# Patient Record
Sex: Female | Born: 1940
Health system: Southern US, Community
[De-identification: ages and names within clinical notes are randomized; demographics above are authoritative.]

## PROBLEM LIST (undated history)

## (undated) DIAGNOSIS — C50412 Malignant neoplasm of upper-outer quadrant of left female breast: Secondary | ICD-10-CM

## (undated) DIAGNOSIS — E7800 Pure hypercholesterolemia, unspecified: Secondary | ICD-10-CM

## (undated) DIAGNOSIS — J189 Pneumonia, unspecified organism: Secondary | ICD-10-CM

## (undated) DIAGNOSIS — E119 Type 2 diabetes mellitus without complications: Secondary | ICD-10-CM

## (undated) DIAGNOSIS — F0390 Unspecified dementia without behavioral disturbance: Secondary | ICD-10-CM

## (undated) DIAGNOSIS — F32A Depression, unspecified: Secondary | ICD-10-CM

## (undated) DIAGNOSIS — R0600 Dyspnea, unspecified: Secondary | ICD-10-CM

## (undated) DIAGNOSIS — M1990 Unspecified osteoarthritis, unspecified site: Secondary | ICD-10-CM

## (undated) DIAGNOSIS — K08109 Complete loss of teeth, unspecified cause, unspecified class: Secondary | ICD-10-CM

## (undated) DIAGNOSIS — D649 Anemia, unspecified: Secondary | ICD-10-CM

## (undated) DIAGNOSIS — IMO0002 Reserved for concepts with insufficient information to code with codable children: Secondary | ICD-10-CM

## (undated) DIAGNOSIS — IMO0001 Reserved for inherently not codable concepts without codable children: Secondary | ICD-10-CM

## (undated) DIAGNOSIS — Z9981 Dependence on supplemental oxygen: Secondary | ICD-10-CM

## (undated) DIAGNOSIS — F419 Anxiety disorder, unspecified: Secondary | ICD-10-CM

## (undated) DIAGNOSIS — R413 Other amnesia: Secondary | ICD-10-CM

## (undated) DIAGNOSIS — Z972 Presence of dental prosthetic device (complete) (partial): Secondary | ICD-10-CM

## (undated) DIAGNOSIS — S62109A Fracture of unspecified carpal bone, unspecified wrist, initial encounter for closed fracture: Secondary | ICD-10-CM

## (undated) DIAGNOSIS — J449 Chronic obstructive pulmonary disease, unspecified: Secondary | ICD-10-CM

## (undated) DIAGNOSIS — I10 Essential (primary) hypertension: Secondary | ICD-10-CM

## (undated) DIAGNOSIS — F329 Major depressive disorder, single episode, unspecified: Secondary | ICD-10-CM

## (undated) HISTORY — DX: Type 2 diabetes mellitus without complications: E11.9

## (undated) HISTORY — PX: APPENDECTOMY: SHX54

## (undated) HISTORY — DX: Pure hypercholesterolemia, unspecified: E78.00

## (undated) HISTORY — DX: Chronic obstructive pulmonary disease, unspecified: J44.9

## (undated) HISTORY — DX: Depression, unspecified: F32.A

## (undated) HISTORY — DX: Malignant neoplasm of upper-outer quadrant of left female breast: C50.412

## (undated) HISTORY — PX: TONSILLECTOMY: SUR1361

## (undated) HISTORY — PX: CHOLECYSTECTOMY: SHX55

## (undated) HISTORY — PX: ABDOMINAL HYSTERECTOMY: SHX81

## (undated) HISTORY — DX: Essential (primary) hypertension: I10

## (undated) HISTORY — PX: ABDOMINAL HYSTERECTOMY: SUR658

## (undated) HISTORY — PX: EYE SURGERY: SHX253

## (undated) HISTORY — DX: Major depressive disorder, single episode, unspecified: F32.9

---
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
## 1998-11-18 ENCOUNTER — Ambulatory Visit: Admission: RE | Admit: 1998-11-18 | Discharge: 1998-11-18 | Payer: Self-pay | Admitting: *Deleted

## 1998-11-18 ENCOUNTER — Encounter: Payer: Self-pay | Admitting: *Deleted

## 1999-01-15 ENCOUNTER — Other Ambulatory Visit: Admission: RE | Admit: 1999-01-15 | Discharge: 1999-01-15 | Payer: Self-pay | Admitting: Obstetrics and Gynecology

## 1999-05-04 ENCOUNTER — Encounter (INDEPENDENT_AMBULATORY_CARE_PROVIDER_SITE_OTHER): Payer: Self-pay | Admitting: Specialist

## 1999-05-04 ENCOUNTER — Ambulatory Visit (HOSPITAL_COMMUNITY): Admission: RE | Admit: 1999-05-04 | Discharge: 1999-05-04 | Payer: Self-pay | Admitting: Gastroenterology

## 1999-06-19 ENCOUNTER — Encounter: Admission: RE | Admit: 1999-06-19 | Discharge: 1999-08-25 | Payer: Self-pay

## 2000-02-25 ENCOUNTER — Other Ambulatory Visit: Admission: RE | Admit: 2000-02-25 | Discharge: 2000-02-25 | Payer: Self-pay | Admitting: Obstetrics and Gynecology

## 2000-03-07 ENCOUNTER — Other Ambulatory Visit: Admission: RE | Admit: 2000-03-07 | Discharge: 2000-03-07 | Payer: Self-pay | Admitting: *Deleted

## 2000-03-07 ENCOUNTER — Encounter: Admission: RE | Admit: 2000-03-07 | Discharge: 2000-03-07 | Payer: Self-pay | Admitting: *Deleted

## 2000-03-07 ENCOUNTER — Encounter: Payer: Self-pay | Admitting: *Deleted

## 2000-03-07 ENCOUNTER — Encounter (INDEPENDENT_AMBULATORY_CARE_PROVIDER_SITE_OTHER): Payer: Self-pay | Admitting: *Deleted

## 2001-04-26 ENCOUNTER — Other Ambulatory Visit: Admission: RE | Admit: 2001-04-26 | Discharge: 2001-04-26 | Payer: Self-pay | Admitting: Obstetrics and Gynecology

## 2002-07-04 ENCOUNTER — Ambulatory Visit (HOSPITAL_COMMUNITY): Admission: RE | Admit: 2002-07-04 | Discharge: 2002-07-04 | Payer: Self-pay | Admitting: Gastroenterology

## 2008-06-28 ENCOUNTER — Encounter: Admission: RE | Admit: 2008-06-28 | Discharge: 2008-06-28 | Payer: Self-pay | Admitting: Family Medicine

## 2009-11-25 ENCOUNTER — Inpatient Hospital Stay (HOSPITAL_COMMUNITY): Admission: EM | Admit: 2009-11-25 | Discharge: 2009-11-28 | Payer: Self-pay | Admitting: Emergency Medicine

## 2010-12-07 LAB — DIFFERENTIAL
Basophils Absolute: 0 10*3/uL (ref 0.0–0.1)
Basophils Absolute: 0 10*3/uL (ref 0.0–0.1)
Basophils Relative: 0 % (ref 0–1)
Basophils Relative: 0 % (ref 0–1)
Eosinophils Absolute: 0 10*3/uL (ref 0.0–0.7)
Eosinophils Absolute: 0 10*3/uL (ref 0.0–0.7)
Eosinophils Relative: 0 % (ref 0–5)
Eosinophils Relative: 0 % (ref 0–5)
Lymphocytes Relative: 10 % — ABNORMAL LOW (ref 12–46)
Lymphocytes Relative: 9 % — ABNORMAL LOW (ref 12–46)
Lymphs Abs: 0.6 10*3/uL — ABNORMAL LOW (ref 0.7–4.0)
Lymphs Abs: 0.8 10*3/uL (ref 0.7–4.0)
Monocytes Absolute: 0.1 10*3/uL (ref 0.1–1.0)
Monocytes Absolute: 0.4 10*3/uL (ref 0.1–1.0)
Monocytes Relative: 1 % — ABNORMAL LOW (ref 3–12)
Monocytes Relative: 5 % (ref 3–12)
Neutro Abs: 6.8 10*3/uL (ref 1.7–7.7)
Neutro Abs: 7.2 10*3/uL (ref 1.7–7.7)
Neutrophils Relative %: 85 % — ABNORMAL HIGH (ref 43–77)
Neutrophils Relative %: 90 % — ABNORMAL HIGH (ref 43–77)

## 2010-12-07 LAB — GLUCOSE, CAPILLARY
Glucose-Capillary: 113 mg/dL — ABNORMAL HIGH (ref 70–99)
Glucose-Capillary: 123 mg/dL — ABNORMAL HIGH (ref 70–99)
Glucose-Capillary: 136 mg/dL — ABNORMAL HIGH (ref 70–99)
Glucose-Capillary: 139 mg/dL — ABNORMAL HIGH (ref 70–99)
Glucose-Capillary: 181 mg/dL — ABNORMAL HIGH (ref 70–99)
Glucose-Capillary: 183 mg/dL — ABNORMAL HIGH (ref 70–99)
Glucose-Capillary: 233 mg/dL — ABNORMAL HIGH (ref 70–99)
Glucose-Capillary: 239 mg/dL — ABNORMAL HIGH (ref 70–99)
Glucose-Capillary: 241 mg/dL — ABNORMAL HIGH (ref 70–99)
Glucose-Capillary: 282 mg/dL — ABNORMAL HIGH (ref 70–99)
Glucose-Capillary: 314 mg/dL — ABNORMAL HIGH (ref 70–99)
Glucose-Capillary: 332 mg/dL — ABNORMAL HIGH (ref 70–99)

## 2010-12-07 LAB — BASIC METABOLIC PANEL
BUN: 10 mg/dL (ref 6–23)
BUN: 11 mg/dL (ref 6–23)
BUN: 12 mg/dL (ref 6–23)
CO2: 30 mEq/L (ref 19–32)
CO2: 31 mEq/L (ref 19–32)
CO2: 38 mEq/L — ABNORMAL HIGH (ref 19–32)
Calcium: 8.9 mg/dL (ref 8.4–10.5)
Calcium: 9.2 mg/dL (ref 8.4–10.5)
Calcium: 9.4 mg/dL (ref 8.4–10.5)
Chloride: 96 mEq/L (ref 96–112)
Chloride: 97 mEq/L (ref 96–112)
Chloride: 99 mEq/L (ref 96–112)
Creatinine, Ser: 0.59 mg/dL (ref 0.4–1.2)
Creatinine, Ser: 0.66 mg/dL (ref 0.4–1.2)
Creatinine, Ser: 0.71 mg/dL (ref 0.4–1.2)
GFR calc Af Amer: >60 mL/min (ref >60)
GFR calc Af Amer: >60 mL/min (ref >60)
GFR calc Af Amer: >60 mL/min (ref >60)
GFR calc non Af Amer: >60 mL/min (ref >60)
GFR calc non Af Amer: >60 mL/min (ref >60)
GFR calc non Af Amer: >60 mL/min (ref >60)
Glucose, Bld: 193 mg/dL — ABNORMAL HIGH (ref 70–99)
Glucose, Bld: 199 mg/dL — ABNORMAL HIGH (ref 70–99)
Glucose, Bld: 99 mg/dL (ref 70–99)
Potassium: 4.2 mEq/L (ref 3.5–5.1)
Potassium: 4.5 mEq/L (ref 3.5–5.1)
Potassium: 4.9 mEq/L (ref 3.5–5.1)
Sodium: 133 mEq/L — ABNORMAL LOW (ref 135–145)
Sodium: 134 mEq/L — ABNORMAL LOW (ref 135–145)
Sodium: 140 mEq/L (ref 135–145)

## 2010-12-07 LAB — URINE CULTURE: Colony Count: 6000

## 2010-12-07 LAB — CARDIAC PANEL(CRET KIN+CKTOT+MB+TROPI)
CK, MB: 2 ng/mL (ref 0.3–4.0)
CK, MB: 2.1 ng/mL (ref 0.3–4.0)
Relative Index: INVALID (ref 0.0–2.5)
Relative Index: INVALID (ref 0.0–2.5)
Total CK: 42 U/L (ref 7–177)
Total CK: 48 U/L (ref 7–177)
Troponin I: 0.02 ng/mL (ref 0.00–0.06)
Troponin I: 0.02 ng/mL (ref 0.00–0.06)

## 2010-12-07 LAB — URINALYSIS, ROUTINE W REFLEX MICROSCOPIC
Bilirubin Urine: NEGATIVE
Glucose, UA: NEGATIVE mg/dL
Ketones, ur: NEGATIVE mg/dL
Leukocytes, UA: NEGATIVE
Nitrite: NEGATIVE
Protein, ur: 100 mg/dL — AB
Specific Gravity, Urine: 1.015 (ref 1.005–1.030)
Urobilinogen, UA: 0.2 mg/dL (ref 0.0–1.0)
pH: 6 (ref 5.0–8.0)

## 2010-12-07 LAB — BRAIN NATRIURETIC PEPTIDE: Pro B Natriuretic peptide (BNP): 179 pg/mL — ABNORMAL HIGH (ref 0.0–100.0)

## 2010-12-07 LAB — LIPID PANEL
Cholesterol: 149 mg/dL (ref 0–200)
HDL: 52 mg/dL (ref >39)
LDL Cholesterol: 83 mg/dL (ref 0–99)
Total CHOL/HDL Ratio: 2.9 RATIO
Triglycerides: 72 mg/dL (ref <150)
VLDL: 14 mg/dL (ref 0–40)

## 2010-12-07 LAB — CBC
HCT: 48.3 % — ABNORMAL HIGH (ref 36.0–46.0)
HCT: 49.9 % — ABNORMAL HIGH (ref 36.0–46.0)
HCT: 51 % — ABNORMAL HIGH (ref 36.0–46.0)
Hemoglobin: 15.6 g/dL — ABNORMAL HIGH (ref 12.0–15.0)
Hemoglobin: 16.4 g/dL — ABNORMAL HIGH (ref 12.0–15.0)
Hemoglobin: 16.6 g/dL — ABNORMAL HIGH (ref 12.0–15.0)
MCHC: 32.4 g/dL (ref 30.0–36.0)
MCHC: 32.6 g/dL (ref 30.0–36.0)
MCHC: 32.9 g/dL (ref 30.0–36.0)
MCV: 97.1 fL (ref 78.0–100.0)
MCV: 97.6 fL (ref 78.0–100.0)
MCV: 97.7 fL (ref 78.0–100.0)
Platelets: 205 10*3/uL (ref 150–400)
Platelets: 208 10*3/uL (ref 150–400)
Platelets: 224 10*3/uL (ref 150–400)
RBC: 4.94 MIL/uL (ref 3.87–5.11)
RBC: 5.12 MIL/uL — ABNORMAL HIGH (ref 3.87–5.11)
RBC: 5.25 MIL/uL — ABNORMAL HIGH (ref 3.87–5.11)
RDW: 13.9 % (ref 11.5–15.5)
RDW: 14 % (ref 11.5–15.5)
RDW: 14.2 % (ref 11.5–15.5)
WBC: 7 10*3/uL (ref 4.0–10.5)
WBC: 7.5 10*3/uL (ref 4.0–10.5)
WBC: 8.5 10*3/uL (ref 4.0–10.5)

## 2010-12-07 LAB — HEMOGLOBIN A1C
Hgb A1c MFr Bld: 6.6 % — ABNORMAL HIGH (ref 4.6–6.1)
Mean Plasma Glucose: 143 mg/dL

## 2010-12-07 LAB — PHOSPHORUS: Phosphorus: 4.8 mg/dL — ABNORMAL HIGH (ref 2.3–4.6)

## 2010-12-07 LAB — CK TOTAL AND CKMB (NOT AT ARMC)
CK, MB: 2.3 ng/mL (ref 0.3–4.0)
Relative Index: INVALID (ref 0.0–2.5)
Total CK: 47 U/L (ref 7–177)

## 2010-12-07 LAB — MAGNESIUM: Magnesium: 2 mg/dL (ref 1.5–2.5)

## 2010-12-07 LAB — TSH: TSH: 0.659 u[IU]/mL (ref 0.350–4.500)

## 2010-12-07 LAB — TROPONIN I: Troponin I: 0.03 ng/mL (ref 0.00–0.06)

## 2010-12-07 LAB — URINE MICROSCOPIC-ADD ON

## 2010-12-07 LAB — D-DIMER, QUANTITATIVE: D-Dimer, Quant: 0.41 ug/mL-FEU (ref 0.00–0.48)

## 2011-01-29 NOTE — Op Note (Signed)
   NAME:  Dawn [name], Dawn [name] NO.:  [id number]   MEDICAL RECORD NO.:  [id number]                   PATIENT TYPE:   LOCATION:                                       FACILITY:  [facility]   PHYSICIAN:  Danise Edge, M.D.                DATE OF BIRTH:   DATE OF PROCEDURE:  07/04/2002  DATE OF DISCHARGE:                                 OPERATIVE REPORT   PROCEDURE:  Screening colonoscopy.   ENDOSCOPIST:  Verlin Grills, M.D.   INDICATIONS FOR PROCEDURE:  This patient is a 70 year old female born  10/30/1940.  She has two sisters who died of colon cancer over the  age of 22.  She is scheduled to undergo a screening colonoscopy with  polypectomy to prevent colon cancer.   PREMEDICATION:  Versed 7 mg, Demerol 50 mg.   ENDOSCOPE:  Olympus pediatric colonoscope.   DESCRIPTION OF PROCEDURE:  After obtaining the informed consent, the patient  was placed in the left lateral decubitus position.  I administered  intravenous Demerol and intravenous Versed to achieve conscious sedation  before the  procedure.  The patient's blood pressure, oxygen saturation, and cardiac  rhythm were monitored throughout the procedure and documented in the medical  record.   Anal inspection was normal.  Digital rectal exam was normal.  The Olympus  pediatric video colonoscope was introduced into the rectum and advanced to  the cecum.  Colonic preparation for the exam today was excellent.   Rectum normal.   Sigmoid colon and descending colon:  Normal.   Splenic flexure normal.   Transverse colon normal.   Hepatic flexure normal.   Ascending colon normal.   Cecum and ileocecal valve normal.    ASSESSMENT:  Normal screening proctocolonoscopy to the cecum.  No endoscopic  evidence for the presence of colorectal neoplasia.   RECOMMENDATIONS:  Repeat colonoscopy in five years.                                               Danise Edge,  M.D.    MJ/MEDQ  D:  07/04/2002  T:  07/04/2002  Job:  [id number]   cc:   Everardo All. Madilyn Fireman, M.D.  [address], [address]  [place]  [place] [address]  Fax: [phone]   Malachi Pro. Ambrose Mantle, M.D.  [address]  [place]  [place] [address]  Fax: [phone]

## 2014-05-22 ENCOUNTER — Encounter: Payer: Self-pay | Admitting: General Surgery

## 2014-05-22 DIAGNOSIS — E782 Mixed hyperlipidemia: Secondary | ICD-10-CM

## 2014-05-22 DIAGNOSIS — I10 Essential (primary) hypertension: Secondary | ICD-10-CM

## 2014-06-13 ENCOUNTER — Other Ambulatory Visit: Payer: Self-pay | Admitting: *Deleted

## 2014-06-13 MED ORDER — CILOSTAZOL 50 MG PO TABS: 50.0000 mg | ORAL_TABLET | Freq: Two times a day (BID) | ORAL | Status: DC

## 2014-08-10 ENCOUNTER — Other Ambulatory Visit: Payer: Self-pay | Admitting: Interventional Cardiology

## 2015-01-02 ENCOUNTER — Other Ambulatory Visit: Payer: Self-pay | Admitting: Interventional Cardiology

## 2015-03-15 ENCOUNTER — Other Ambulatory Visit: Payer: Self-pay | Admitting: Interventional Cardiology

## 2015-03-28 NOTE — Telephone Encounter (Signed)
Did I see her at [facility]?

## 2015-03-31 NOTE — Telephone Encounter (Signed)
Would see if her PCP would fill this since she has not needed to be seen by [place].

## 2015-03-31 NOTE — Telephone Encounter (Signed)
Pt was last seen by you at [facility] in 2014

## 2015-05-03 ENCOUNTER — Other Ambulatory Visit: Payer: Self-pay | Admitting: Interventional Cardiology

## 2015-07-07 ENCOUNTER — Other Ambulatory Visit: Payer: Self-pay | Admitting: Family Medicine

## 2015-08-15 ENCOUNTER — Other Ambulatory Visit: Payer: Self-pay | Admitting: Radiology

## 2015-08-15 DIAGNOSIS — C801 Malignant (primary) neoplasm, unspecified: Secondary | ICD-10-CM

## 2015-08-18 ENCOUNTER — Encounter: Payer: Self-pay | Admitting: *Deleted

## 2015-08-18 ENCOUNTER — Telehealth: Payer: Self-pay | Admitting: *Deleted

## 2015-08-18 ENCOUNTER — Inpatient Hospital Stay: Admission: RE | Admit: 2015-08-18 | Payer: Self-pay | Source: Ambulatory Visit

## 2015-08-18 DIAGNOSIS — C50412 Malignant neoplasm of upper-outer quadrant of left female breast: Secondary | ICD-10-CM

## 2015-08-18 HISTORY — DX: Malignant neoplasm of upper-outer quadrant of left female breast: C50.412

## 2015-08-18 NOTE — Telephone Encounter (Signed)
Confirmed [facility] for 08/20/15 at 1230.  Instructions and contact information given.

## 2015-08-20 ENCOUNTER — Encounter: Payer: Self-pay | Admitting: Physical Therapy

## 2015-08-20 ENCOUNTER — Other Ambulatory Visit (HOSPITAL_BASED_OUTPATIENT_CLINIC_OR_DEPARTMENT_OTHER): Payer: Medicare Other

## 2015-08-20 ENCOUNTER — Ambulatory Visit
Admission: RE | Admit: 2015-08-20 | Discharge: 2015-08-20 | Disposition: A | Discharge status: Home / Self Care | Payer: Medicare Other | Source: Ambulatory Visit | Attending: Radiation Oncology | Admitting: Radiation Oncology

## 2015-08-20 ENCOUNTER — Ambulatory Visit (HOSPITAL_BASED_OUTPATIENT_CLINIC_OR_DEPARTMENT_OTHER): Payer: Medicare Other | Admitting: Hematology and Oncology

## 2015-08-20 ENCOUNTER — Ambulatory Visit: Payer: Medicare Other | Attending: General Surgery | Admitting: Physical Therapy

## 2015-08-20 ENCOUNTER — Other Ambulatory Visit: Payer: Self-pay

## 2015-08-20 ENCOUNTER — Encounter: Payer: Self-pay | Admitting: Nurse Practitioner

## 2015-08-20 ENCOUNTER — Other Ambulatory Visit: Payer: Self-pay | Admitting: General Surgery

## 2015-08-20 ENCOUNTER — Encounter: Payer: Self-pay | Admitting: Hematology and Oncology

## 2015-08-20 VITALS — BP 112/90 | HR 96 | Temp 97.8°F | Resp 18 | Ht 64.0 in | Wt 141.3 lb

## 2015-08-20 DIAGNOSIS — C50412 Malignant neoplasm of upper-outer quadrant of left female breast: Secondary | ICD-10-CM

## 2015-08-20 DIAGNOSIS — Z170 Estrogen receptor positive status [ER+]: Secondary | ICD-10-CM | POA: Diagnosis not present

## 2015-08-20 DIAGNOSIS — R293 Abnormal posture: Secondary | ICD-10-CM | POA: Diagnosis present

## 2015-08-20 LAB — CBC WITH DIFFERENTIAL/PLATELET
BASO%: 0.9 % (ref 0.0–2.0)
Basophils Absolute: 0.1 10*3/uL (ref 0.0–0.1)
EOS%: 1.3 % (ref 0.0–7.0)
Eosinophils Absolute: 0.1 10*3/uL (ref 0.0–0.5)
HCT: 44 % (ref 34.8–46.6)
HGB: 14.6 g/dL (ref 11.6–15.9)
LYMPH%: 41.7 % (ref 14.0–49.7)
MCH: 29.4 pg (ref 25.1–34.0)
MCHC: 33.2 g/dL (ref 31.5–36.0)
MCV: 88.6 fL (ref 79.5–101.0)
MONO#: 0.3 10*3/uL (ref 0.1–0.9)
MONO%: 5.4 % (ref 0.0–14.0)
NEUT#: 3.3 10*3/uL (ref 1.5–6.5)
NEUT%: 50.7 % (ref 38.4–76.8)
Platelets: 335 10*3/uL (ref 145–400)
RBC: 4.96 10*6/uL (ref 3.70–5.45)
RDW: 15.1 % — ABNORMAL HIGH (ref 11.2–14.5)
WBC: 6.4 10*3/uL (ref 3.9–10.3)
lymph#: 2.7 10*3/uL (ref 0.9–3.3)

## 2015-08-20 LAB — COMPREHENSIVE METABOLIC PANEL
ALT: 11 U/L (ref 0–55)
AST: 18 U/L (ref 5–34)
Albumin: 3.6 g/dL (ref 3.5–5.0)
Alkaline Phosphatase: 142 U/L (ref 40–150)
Anion Gap: 13 mEq/L — ABNORMAL HIGH (ref 3–11)
BUN: 10.4 mg/dL (ref 7.0–26.0)
CO2: 25 mEq/L (ref 22–29)
Calcium: 10 mg/dL (ref 8.4–10.4)
Chloride: 99 mEq/L (ref 98–109)
Creatinine: 1.1 mg/dL (ref 0.6–1.1)
EGFR: 52 mL/min/{1.73_m2} — ABNORMAL LOW (ref >90)
Glucose: 225 mg/dl — ABNORMAL HIGH (ref 70–140)
Potassium: 4.3 mEq/L (ref 3.5–5.1)
Sodium: 137 mEq/L (ref 136–145)
Total Bilirubin: 0.32 mg/dL (ref 0.20–1.20)
Total Protein: 7.1 g/dL (ref 6.4–8.3)

## 2015-08-20 NOTE — Progress Notes (Signed)
Note created by Dr. Gudena during office visit. Copy to patient, original to scan. 

## 2015-08-20 NOTE — Progress Notes (Signed)
[place] CONSULT NOTE  Patient Care Team: Lona Kettle, MD as PCP - General (Family Medicine) Excell Seltzer, MD as Consulting Physician (General Surgery) Nicholas Lose, MD as Consulting Physician (Hematology and Oncology) Arloa Koh, MD as Consulting Physician (Radiation Oncology)  CHIEF COMPLAINTS/PURPOSE OF CONSULTATION:  Newly diagnosed breast cancer  HISTORY OF PRESENTING ILLNESS:  Dawn [name] 74 y.o. female is here because of recent diagnosis of left breast cancer. She had bilateral breast tenderness for the last 3 years. She finally arrived at [facility] for evaluation. She did not want undergo mammogram. She actually underwent ultrasound which revealed an asymmetric density in the left breast measuring 2.5 cm. She then underwent a screening mammogram which did show the asymmetric density but was not a diagnostic mammogram. She underwent ultrasound-guided biopsy which showed invasive ductal carcinoma with DCIS that was ER/PR positive and HER-2 negative. She was presented this morning to the multidisciplinary tumor board and she is here today to discuss a treatment plan. She is a heavy smoker and was very anxious throughout the interview.she reported that she is been very anxious since the diagnosis of cancer. She has had multiple sisters were diagnosed with cancer.  I reviewed her records extensively and collaborated the history with the patient.  SUMMARY OF ONCOLOGIC HISTORY:   Breast cancer of upper-outer quadrant of left female breast ([place])   08/14/2015 Initial Diagnosis left breast biopsy: Invasive ductal carcinoma with DCIS grade 1-2, ER 100%, PR 20%, Ki-67 20%, HER-2 negative ratio 1.77   08/14/2015 Mammogram left breast focal asymmetry, ultrasound revealed 2.5 cm lesion. Because of breast pain, patient refuses diagnostic mammograms.   MEDICAL HISTORY:  Past Medical History  Diagnosis Date  . Diabetes mellitus without complication ([place])   . High cholesterol    . COPD (chronic obstructive pulmonary disease) ([place])   . Breast cancer of upper-outer quadrant of left female breast ([place]) 08/18/2015  . Hypertension   . Depression     SURGICAL HISTORY: Past Surgical History  Procedure Laterality Date  . Abdominal hysterectomy    . Appendectomy    . Cholecystectomy    . Tonsillectomy    . Abdominal hysterectomy      SOCIAL HISTORY: Social History   Social History  . Marital Status: Legally Separated    Spouse Name: N/A  . Number of Children: N/A  . Years of Education: N/A   Occupational History  . Not on file.   Social History Main Topics  . Smoking status: Current Every Day Smoker -- 1.00 packs/day    Types: Cigarettes  . Smokeless tobacco: Not on file  . Alcohol Use: Yes     Comment: rare  . Drug Use: No  . Sexual Activity: Not on file   Other Topics Concern  . Not on file   Social History Narrative    FAMILY HISTORY: Family History  Problem Relation Age of Onset  . Colon cancer Sister   . Lung cancer Sister     ALLERGIES:  is allergic to other and prandin.  MEDICATIONS:  Current Outpatient Prescriptions  Medication Sig Dispense Refill  . acarbose (PRECOSE) 25 MG tablet     . buPROPion (WELLBUTRIN XL) 300 MG 24 hr tablet Take 300 mg by mouth daily.    Marland Kitchen glipiZIDE (GLUCOTROL XL) 5 MG 24 hr tablet Takes 2.5 mg    . metFORMIN (GLUMETZA) 500 MG (MOD) 24 hr tablet Take 1,500 mg by mouth daily with breakfast.    . pravastatin (PRAVACHOL) 40  MG tablet Take 40 mg by mouth daily.    . quinapril (ACCUPRIL) 40 MG tablet Take 40 mg by mouth at bedtime.    . verapamil (COVERA HS) 240 MG (CO) 24 hr tablet Take 240 mg by mouth at bedtime.     No current facility-administered medications for this visit.    REVIEW OF SYSTEMS:   Constitutional: Denies fevers, chills or abnormal night sweats Eyes: Denies blurriness of vision, double vision or watery eyes Ears, nose, mouth, throat, and face: Denies mucositis or sore  throat Respiratory: cough and shortness of breath due to tobacco abuse and chronic bronchitis Cardiovascular: Denies palpitation, chest discomfort or lower extremity swelling Gastrointestinal:  Denies nausea, heartburn or change in bowel habits Skin: Denies abnormal skin rashes Lymphatics: Denies new lymphadenopathy or easy bruising Neurological:Denies numbness, tingling or new weaknesses Behavioral/Psych: Mood is stable, no new changes  Breast: bilateral breast pain worse on the right All other systems were reviewed with the patient and are negative.  PHYSICAL EXAMINATION: ECOG PERFORMANCE STATUS: 1 - Symptomatic but completely ambulatory  Filed Vitals:   08/20/15 1245  BP: 112/90  Pulse: 96  Temp: 97.8 F (36.6 C)  Resp: 18   Filed Weights   08/20/15 1245  Weight: 141 lb 4.8 oz (64.093 kg)    GENERAL:alert, no distress and comfortable SKIN: skin color, texture, turgor are normal, no rashes or significant lesions EYES: normal, conjunctiva are pink and non-injected, sclera clear OROPHARYNX:no exudate, no erythema and lips, buccal mucosa, and tongue normal  NECK: supple, thyroid normal size, non-tender, without nodularity LYMPH:  no palpable lymphadenopathy in the cervical, axillary or inguinal LUNGS: diminished breath sounds bilaterally but no crackles or wheezes HEART: regular rate & rhythm and no murmurs and no lower extremity edema ABDOMEN:abdomen soft, non-tender and normal bowel sounds Musculoskeletal:no cyanosis of digits and no clubbing  PSYCH: alert & oriented x 3 with fluent speech NEURO: no focal motor/sensory deficits BREAST: No palpable nodules in breast. No palpable axillary or supraclavicular lymphadenopathy (exam performed in the presence of a chaperone)   LABORATORY DATA:  I have reviewed the data as listed Lab Results  Component Value Date   WBC 6.4 08/20/2015   HGB 14.6 08/20/2015   HCT 44.0 08/20/2015   MCV 88.6 08/20/2015   PLT 335 08/20/2015    Lab Results  Component Value Date   NA 137 08/20/2015   K 4.3 08/20/2015   CL 97 11/28/2009   CO2 25 08/20/2015   ASSESSMENT AND PLAN:  Breast cancer of upper-outer quadrant of left female breast (HCC) Left breast biopsy: Invasive ductal carcinoma grade 1/2, ER 100%, PR 20%, HER-2 negative ratio 1.77, Ki 67 20%, ultrasound revealed 2.5 cm lesion at 2:00 position axilla negative, T2 N0 stage II a clinical stage Mammogram was performed but it was inadequate because of breast pain limiting their inability to do compression imaging. MRI canceled by patient  Pathology and radiology counseling:Discussed with the patient, the details of pathology including the type of breast cancer,the clinical staging, the significance of ER, PR and HER-2/neu receptors and the implications for treatment. After reviewing the pathology in detail, we proceeded to discuss the different treatment options between surgery, radiation, chemotherapy, antiestrogen therapies.  Recommendations: 1. Breast conserving surgery followed by 2. Oncotype DX testing to determine if chemotherapy would be of any benefit followed by 3. Adjuvant radiation therapy followed by 4. Adjuvant antiestrogen therapy  Oncotype counseling: I discussed Oncotype DX test. I explained to the patient that this is  a 21 gene panel to evaluate patient tumors DNA to calculate recurrence score. This would help determine whether patient has high risk or intermediate risk or low risk breast cancer. She understands that if her tumor was found to be high risk, she would benefit from systemic chemotherapy. If low risk, no need of chemotherapy. If she was found to be intermediate risk, we would need to evaluate the score as well as other risk factors and determine if an abbreviated chemotherapy may be of benefit.  Return to clinic after surgery to discuss final pathology report and then determine if Oncotype DX testing will need to be sent.      All questions  were answered. The patient knows to call the clinic with any problems, questions or concerns.    Rulon Eisenmenger, MD 2:45 PM

## 2015-08-20 NOTE — Assessment & Plan Note (Signed)
Left breast biopsy: Invasive ductal carcinoma grade 1/2, ER 100%, PR 20%, HER-2 negative ratio 1.77, Ki 67 20%, ultrasound revealed 2.5 cm lesion at 2:00 position axilla negative, T2 N0 stage II a clinical stage Mammogram was performed but it was inadequate because of breast pain limiting their inability to do compression imaging. MRI canceled by patient  Pathology and radiology counseling:Discussed with the patient, the details of pathology including the type of breast cancer,the clinical staging, the significance of ER, PR and HER-2/neu receptors and the implications for treatment. After reviewing the pathology in detail, we proceeded to discuss the different treatment options between surgery, radiation, chemotherapy, antiestrogen therapies.  Recommendations: 1. Breast conserving surgery followed by 2. Oncotype DX testing to determine if chemotherapy would be of any benefit followed by 3. Adjuvant radiation therapy followed by 4. Adjuvant antiestrogen therapy  Oncotype counseling: I discussed Oncotype DX test. I explained to the patient that this is a 21 gene panel to evaluate patient tumors DNA to calculate recurrence score. This would help determine whether patient has high risk or intermediate risk or low risk breast cancer. She understands that if her tumor was found to be high risk, she would benefit from systemic chemotherapy. If low risk, no need of chemotherapy. If she was found to be intermediate risk, we would need to evaluate the score as well as other risk factors and determine if an abbreviated chemotherapy may be of benefit.  Return to clinic after surgery to discuss final pathology report and then determine if Oncotype DX testing will need to be sent.

## 2015-08-20 NOTE — Progress Notes (Signed)
[facility] NEW PATIENT EVALUATION  Name: Dawn [name] MRN: [id number]  Date:   08/20/2015           DOB: 1941-02-20  Status: outpatient   CC:  Melinda Crutch, MD  Dr. Adonis Housekeeper   REFERRING PHYSICIAN: Dr. Adonis Housekeeper  DIAGNOSIS: Stage II A (T2 N0 M0) invasive ductal/DCIS of the left breast   HISTORY OF PRESENT ILLNESS:  Dawn [name] is a 74 y.o. female who is seen today at the breast multidisciplinary clinic through the courtesy of Dr. Excell Seltzer for evaluation of her T2 N0 invasive ductal/DCIS of the left breast.  She presented with a 2- 3 year history of intermittent migratory right breast discomfort.  More recently she had left breast discomfort and ultrasound on 08/04/2015 showed a 2.5 cm lobulated left breast mass, upper-outer quadrant middle depth, suspicious for malignancy.  The axilla was benign on ultrasound.  Mammography showed focal asymmetry with calcifications within the upper-outer quadrant of the left breast.  Ultrasound-guided biopsy on 08/14/2015 was diagnostic for invasive ductal and DCIS with associated microcalcifications.  Her tumor was ER positive at 100%, PR +20% with a Ki-67 of 20%.  HER-2/neu was negative.  She is seen today with Dr. Excell Seltzer and Dr. Lindi Adie.  PREVIOUS RADIATION THERAPY: No   PAST MEDICAL HISTORY:  has a past medical history of Diabetes mellitus without complication ([place]); High cholesterol; COPD (chronic obstructive pulmonary disease) ([place]); Breast cancer of upper-outer quadrant of left female breast ([place]) (08/18/2015); Hypertension; and Depression.     PAST SURGICAL HISTORY:  Past Surgical History  Procedure Laterality Date  . Abdominal hysterectomy    . Appendectomy    . Cholecystectomy    . Tonsillectomy    . Abdominal hysterectomy       FAMILY HISTORY: family history includes Colon cancer in her sister; Lung cancer in her sister.  Her father died in his mid 60s, unknown cause, in her mother died at age 48.  No  family history of breast or ovarian cancer.   SOCIAL HISTORY:  reports that she has been smoking Cigarettes.  She has been smoking about 1.00 pack per day. She does not have any smokeless tobacco history on file. She reports that she drinks alcohol. She reports that she does not use illicit drugs.  Widowed for the past 21 years, one son.  She worked as a Glass blower/designer in a Clinical cytogeneticist.   ALLERGIES: Other and Prandin   MEDICATIONS:  Current Outpatient Prescriptions  Medication Sig Dispense Refill  . acarbose (PRECOSE) 25 MG tablet     . buPROPion (WELLBUTRIN XL) 300 MG 24 hr tablet Take 300 mg by mouth daily.    Marland Kitchen glipiZIDE (GLUCOTROL XL) 5 MG 24 hr tablet Takes 2.5 mg    . metFORMIN (GLUMETZA) 500 MG (MOD) 24 hr tablet Take 1,500 mg by mouth daily with breakfast.    . pravastatin (PRAVACHOL) 40 MG tablet Take 40 mg by mouth daily.    . quinapril (ACCUPRIL) 40 MG tablet Take 40 mg by mouth at bedtime.    . verapamil (COVERA HS) 240 MG (CO) 24 hr tablet Take 240 mg by mouth at bedtime.     No current facility-administered medications for this encounter.     REVIEW OF SYSTEMS:  Pertinent items are noted in HPI.    PHYSICAL EXAM: Alert and oriented 74 year old white female appearing older than her stated age. Wt Readings from Last 3 Encounters:  08/20/15 141 lb  4.8 oz (64.093 kg)   Temp Readings from Last 3 Encounters:  08/20/15 97.8 F (36.6 C) Oral   BP Readings from Last 3 Encounters:  08/20/15 112/90   Pulse Readings from Last 3 Encounters:  08/20/15 96   Head and neck examination: Grossly unremarkable.  Nodes: Without palpable cervical, supraclavicular, or axillary lymphadenopathy.  Breasts: There is a palpable mass measuring approximately 2 cm within the upper-outer quadrant at 2:00.  There is adjacent ecchymosis from her biopsy.  No other masses are appreciated.  Right breast without masses or lesions.  Extremities: Without edema.    LABORATORY DATA:  Lab Results   Component Value Date   WBC 6.4 08/20/2015   HGB 14.6 08/20/2015   HCT 44.0 08/20/2015   MCV 88.6 08/20/2015   PLT 335 08/20/2015   Lab Results  Component Value Date   NA 137 08/20/2015   K 4.3 08/20/2015   CL 97 11/28/2009   CO2 25 08/20/2015   Lab Results  Component Value Date   ALT 11 08/20/2015   AST 18 08/20/2015   ALKPHOS 142 08/20/2015   BILITOT 0.32 08/20/2015      IMPRESSION: Clinical stage II A (T2 N0 M0) invasive ductal/DCIS of the left breast.  We discussed local management options which include mastectomy versus partial mastectomy along with a sentinel lymph node biopsy.  She desires breast preservation.  With a T2 primary, I would probably lean towards giving her radiation therapy rather than antiestrogen therapy alone.  Dr. Lindi Adie will probably order Oncotype DX testing.  We discussed hypofractionated treatment versus standard fractionation, and she would be a candidate for hypofractionated treatment.  We also discussed deep inspiration breath-hold technology to avoid cardiac irradiation.  She is a smoker and she has some degree of COPD so she may not be a candidate for deep inspiration breath-hold..  We discussed the potential acute and late toxicities of radiation therapy, and the possible need for pre-radiation therapy mammography to confirm removal of all suspicious microcalcifications.  With my retirement, she should be scheduled see Dr. Eppie Gibson for a routine follow-up postoperatively.  Lastly, I encouraged her to stop smoking.   PLAN: As discussed above.  I spent 30  minutes face to face with the patient and more than 50% of that time was spent in counseling and/or coordination of care.

## 2015-08-20 NOTE — Therapy (Signed)
[place], [place], [address] Phone: [phone]   Fax:  [phone]  Physical Therapy Evaluation  Patient Details  Name: Dawn [name] MRN: [id number] Date of Birth: 1941-04-05 Referring Provider: Dr. Excell Seltzer  Encounter Date: 08/20/2015      PT End of Session - 08/20/15 1505    Visit Number 1   Number of Visits 1   PT Start Time 1435   PT Stop Time 1500   PT Time Calculation (min) 25 min   Activity Tolerance Patient tolerated treatment well   Behavior During Therapy [facility] for tasks assessed/performed      Past Medical History  Diagnosis Date  . Diabetes mellitus without complication ([place])   . High cholesterol   . COPD (chronic obstructive pulmonary disease) ([place])   . Breast cancer of upper-outer quadrant of left female breast ([place]) 08/18/2015  . Hypertension   . Depression     Past Surgical History  Procedure Laterality Date  . Abdominal hysterectomy    . Appendectomy    . Cholecystectomy    . Tonsillectomy    . Abdominal hysterectomy      There were no vitals filed for this visit.  Visit Diagnosis:  Carcinoma of upper-outer quadrant of left female breast [facility]) - Plan: PT plan of care cert/re-cert  Abnormal posture - Plan: PT plan of care cert/re-cert      Subjective Assessment - 08/20/15 1500    Subjective Patient was seen today for a baseline assessment of her newly diagnosed left breast cancer.   Pertinent History Patient was diagnosed on 08/14/15 with left grade 1-2 invasive ductal carcinoma breast cancer.  It is ER/PR positive and HER2 negative.  Her mass measures 2.5 cm and is located in the upper outer quadrant.   Patient Stated Goals Reduce lymphedema risk and elarn post op shoulder ROM HEP   Currently in Pain? No/denies            [facility] PT Assessment - 08/20/15 0001    Assessment   Medical Diagnosis Left breast cancer   Referring Provider Dr. Excell Seltzer   Onset Date/Surgical Date 08/14/15   Hand Dominance Left   Prior Therapy none   Precautions   Precautions Other (comment)   Precaution Comments Active breast cancer   Restrictions   Weight Bearing Restrictions No   Balance Screen   Has the patient fallen in the past 6 months No   Has the patient had a decrease in activity level because of a fear of falling?  No   Is the patient reluctant to leave their home because of a fear of falling?  No   Home Social worker Private residence   Living Arrangements Alone   Available Help at Discharge --  unknown   Prior Function   Level of [place] Retired   Leisure She does not exercise   Cognition   Overall Cognitive Status Within Functional Limits for tasks assessed   Posture/Postural Control   Posture/Postural Control Postural limitations   Postural Limitations Forward head;Rounded Shoulders   ROM / Strength   AROM / PROM / Strength AROM;Strength   AROM   AROM Assessment Site Shoulder   Right/Left Shoulder Right;Left   Right Shoulder Extension 42 Degrees   Right Shoulder Flexion 144 Degrees   Right Shoulder ABduction 163 Degrees   Right Shoulder Internal Rotation 57 Degrees   Right Shoulder External Rotation 82 Degrees  Left Shoulder Extension 49 Degrees   Left Shoulder Flexion 147 Degrees   Left Shoulder ABduction 148 Degrees   Left Shoulder Internal Rotation 69 Degrees   Left Shoulder External Rotation 85 Degrees   Strength   Overall Strength Within functional limits for tasks performed           LYMPHEDEMA/ONCOLOGY QUESTIONNAIRE - 08/20/15 1503    Type   Cancer Type Left breast cancer   Lymphedema Assessments   Lymphedema Assessments Upper extremities   Right Upper Extremity Lymphedema   10 cm Proximal to Olecranon Process 25 cm   Olecranon Process 23 cm   10 cm Proximal to Ulnar Styloid Process 19.8 cm   Just Proximal to Ulnar Styloid Process 14.7 cm    Across Hand at [employer] 17.8 cm   At [place] of 2nd Digit 6.2 cm   Left Upper Extremity Lymphedema   10 cm Proximal to Olecranon Process 26.1 cm   Olecranon Process 23.4 cm   10 cm Proximal to Ulnar Styloid Process 19 cm   Just Proximal to Ulnar Styloid Process 14 cm   Across Hand at [employer] 17.5 cm   At [place] of 2nd Digit 6.2 cm      Patient was instructed today in a home exercise program today for post op shoulder range of motion. These included active assist shoulder flexion in sitting, scapular retraction, wall walking with shoulder abduction, and hands behind head external rotation.  She was encouraged to do these twice a day, holding 3 seconds and repeating 5 times when permitted by her physician.         PT Education - 08/20/15 1505    Education provided Yes   Education Details Lymphedema risk reduction and post op shoulder ROM HEP   Person(s) Educated Patient   Methods Explanation;Demonstration;Handout   Comprehension Returned demonstration;Verbalized understanding              Breast Clinic Goals - 08/20/15 1508    Patient will be able to verbalize understanding of pertinent lymphedema risk reduction practices relevant to her diagnosis specifically related to skin care.   Time 1   Period Days   Status Achieved   Patient will be able to return demonstrate and/or verbalize understanding of the post-op home exercise program related to regaining shoulder range of motion.   Time 1   Period Days   Status Achieved   Patient will be able to verbalize understanding of the importance of attending the postoperative After Breast Cancer Class for further lymphedema risk reduction education and therapeutic exercise.   Time 1   Period Days   Status Achieved              Plan - 08/20/15 1505    Clinical Impression Statement Patient was diagnosed on 08/14/15 with left grade 1-2 invasive ductal carcinoma breast cancer.  It is ER/PR positive and HER2 negative.   Her mass measures 2.5 cm and is located in the upper outer quadrant.  She is planning to have a left lumpectomy and entinel node biopsy followed by Oncotype testing, radiation and anti-estrogen therapy.  She may benefit from post op PT to regain shoulder ROM and strength as she lives alone and needs full use of her dominant left arm.   Pt will benefit from skilled therapeutic intervention in order to improve on the following deficits Decreased strength;Decreased knowledge of precautions;Pain;Impaired UE functional use;Decreased range of motion   Rehab Potential Excellent   Clinical Impairments  Affecting Rehab Potential None   PT Frequency One time visit   PT Treatment/Interventions Therapeutic exercise;Patient/family education   Consulted and Agree with Plan of Care Patient     Patient will follow up at outpatient cancer rehab if needed following surgery.  If the patient requires physical therapy at that time, a specific plan will be dictated and sent to the referring physician for approval. The patient was educated today on appropriate basic range of motion exercises to begin post operatively and the importance of attending the After Breast Cancer class following surgery.  Patient was educated today on lymphedema risk reduction practices as it pertains to recommendations that will benefit the patient immediately following surgery.  She verbalized good understanding.  No additional physical therapy is indicated at this time.         G-Codes - 2015-09-11 1512    Functional Assessment Tool Used Clinical Judgement   Functional Limitation Other PT primary   Other PT Primary Current Status (N2258) At least 1 percent but less than 20 percent impaired, limited or restricted   Other PT Primary Goal Status (T4621) At least 1 percent but less than 20 percent impaired, limited or restricted   Other PT Primary Discharge Status (V4712) At least 1 percent but less than 20 percent impaired, limited or restricted        Problem List Patient Active Problem List   Diagnosis Date Noted  . Breast cancer of upper-outer quadrant of left female breast ([place]) 08/18/2015  . Essential hypertension, benign 05/22/2014  . Mixed hyperlipidemia 05/22/2014    Annia Friendly, PT 2015-09-11 3:13 PM   [place], [place], [address] Phone: [phone]   Fax:  [phone]  Name: Dawn [name] MRN: [id number] Date of Birth: [birth year]

## 2015-08-20 NOTE — Patient Instructions (Signed)

## 2015-08-20 NOTE — Progress Notes (Signed)
Dawn [name] is a very pleasant 74 y.o. female from [place], [place] with newly diagnosed invasive mammary carcinoma with carcinoma in situ of the left breast.  Biopsy results revealed the tumor's prognostic profile is ER positive, PR positive, and HER2/neu negative.   She presents today to the [facility]) for treatment consideration and recommendations from the breast surgeon, radiation oncologist, and medical oncologist.     I briefly met with Dawn [name] during her [facility] visit today. We discussed the purpose of the Survivorship Clinic, which will include monitoring for recurrence, coordinating completion of age and gender-appropriate cancer screenings, promotion of overall wellness, as well as managing potential late/long-term side effects of anti-cancer treatments.    The treatment plan for Dawn [name] will likely include surgery,radiation therapy, and anti-estrogen therapy.  As of today, the intent of treatment for Dawn [name] is cure, therefore she will be eligible for the Survivorship Clinic upon her completion of treatment.  Her survivorship care plan (SCP) document will be drafted and updated throughout the course of her treatment trajectory. She will receive the SCP in an office visit with myself in the Survivorship Clinic once she has completed treatment.   I also provided Dawn [name] with a folder of information related to the Patient and Family Arts development officer and programs offered here at the [employer], along with contact information for [place], [facility].  We discussed some of the various programs and services and I encouraged her to consider taking part in them, based on her interest and ability.  Dawn [name] was encouraged to ask questions and all questions were answered to her satisfaction.  She was given my business card and encouraged to contact me with any concerns regarding survivorship.  I look forward to participating in her care.   Kenn File,  [place] [phone]

## 2015-08-22 ENCOUNTER — Encounter: Payer: Self-pay | Admitting: General Practice

## 2015-08-22 NOTE — Progress Notes (Signed)
[place] Psychosocial Distress Screening Clinical Social Work  Clinical Social Work was referred by distress screening protocol.  The patient scored a 7 on the Psychosocial Distress Thermometer which indicates severe distress. Plan to f/u by phone to assess for distress and other psychosocial needs.   ONCBCN DISTRESS SCREENING 08/22/2015  Screening Type Initial Screening  Distress experienced in past week (1-10) 7  Practical problem type Housing  Family Problem type Children  Emotional problem type Depression;Nervousness/Anxiety;Adjusting to illness;Isolation/feeling alone;Adjusting to appearance changes  Spiritual/Religous concerns type Relating to God;Facing my mortality  Information Concerns Type Lack of info about diagnosis;Lack of info about treatment;Lack of info about complementary therapy choices  Physical Problem type Sleep/insomnia;Constipation/diarrhea  Referral to support programs Yes   Reached Ms Lai by phone.  She describes herself as "still in shock" and not quite processing her dx yet.    Follow up needed: Yes.   Per her request, plan to f/u by phone next week for further assessment/conversation.  She gave verbal permission to leave specific VM.  [place], [place], [facility] Pager [phone] Voicemail  [phone]

## 2015-08-22 NOTE — Progress Notes (Signed)
Spiritual Care Addendum  "Clinical Social Work" heading entered in error; should read "Spiritual Care."  Further, referred to [facility] for mentor, per pt request.  Chaplain Lorrin Jackson, [place], [facility] Pager [phone] Voicemail  [phone]

## 2015-08-26 ENCOUNTER — Telehealth: Payer: Self-pay | Admitting: *Deleted

## 2015-08-26 NOTE — Telephone Encounter (Signed)
Left message for a return phone call to follow up from [facility] 12/7.

## 2015-08-29 ENCOUNTER — Encounter: Payer: Self-pay | Admitting: General Practice

## 2015-08-29 ENCOUNTER — Encounter: Payer: Self-pay | Admitting: *Deleted

## 2015-08-29 ENCOUNTER — Telehealth: Payer: Self-pay | Admitting: Hematology and Oncology

## 2015-08-29 NOTE — Telephone Encounter (Signed)
lvm fo rpt regarding to jan appt.Marland KitchenMarland KitchenMarland Kitchen

## 2015-08-29 NOTE — Progress Notes (Signed)
Spiritual Care Note  Finally reached Dawn [name] by phone.  She was frank and in good spirits, noting that she still hasn't registered inwardly that she has cancer.  "How can I be sick," she asks, "when I'm feeling so damn well?!"  We reviewed her distress screen.  Per pt, prayer and sleep are two of her central coping mechanisms; she values prayer support from her church, [employer] (5-year history there).   She values the support of one close friend, Jackelyn Poling, whom she has known for 20-25 years (worked together at [employer]).  Per pt, "I'm concerned about bills, but doing ok right now."  MD:  Pt is curious about what the seed will do and is concerned about whether her current cough will be a problem during surgery.  Pt reports no other needs/concerns at this time.  She requested my name and number with interest in calling as needed.  Please also page as needs arise.  Thank you.  [place], [place], [facility] Pager [phone]

## 2015-09-05 ENCOUNTER — Other Ambulatory Visit (HOSPITAL_COMMUNITY): Payer: Self-pay | Admitting: *Deleted

## 2015-09-05 NOTE — Pre-Procedure Instructions (Signed)
    LADASHA SCHNACKENBERG  09/05/2015      [id number] [place] [facility] [address] [place] [address]Phone: [phone]: [phone]   Your procedure is scheduled on Wednesday, September 17, 2015 at 10:30 AM.   [facility] 8:30 AM.   Call this number if you have problems the morning of surgery: [phone]  Any questions prior to day of surgery, please call [phone] between 8 & 4 PM.   Remember:  Do not eat food or drink liquids after midnight Tuesday, 09/15/14.  Take these medicines the morning of surgery with A SIP OF WATER: Bupropion (Wellbutrin), Verapamil (Calan-SR)  Stop Aspirin 7 days prior to surgery.  How to Manage Your Diabetes Before Surgery   Why is it important to control my blood sugar before and after surgery?   Improving blood sugar levels before and after surgery helps healing and can limit problems.  A way of improving blood sugar control is eating a healthy diet by:  - Eating less sugar and carbohydrates  - Increasing activity/exercise  - Talk with your doctor about reaching your blood sugar goals  High blood sugars (greater than 180 mg/dL) can raise your risk of infections and slow down your recovery so you will need to focus on controlling your diabetes during the weeks before surgery.  Make sure that the doctor who takes care of your diabetes knows about your planned surgery including the date and location.  How do I manage my blood sugars before surgery?   Check your blood sugar at least 4 times a day, 2 days before surgery to make sure that they are not too high or low.   Check your blood sugar the morning of your surgery when you wake up and every 2 hours until you get to the Short-Stay unit.   Treat a low blood sugar (less than 70 mg/dL) with 1/2 cup of clear juice (cranberry or apple), 4 glucose tablets, OR glucose gel.   Recheck blood sugar in 15 minutes  after treatment (to make sure it is greater than 70 mg/dL).  If blood sugar is not greater than 70 mg/dL on re-check, call [phone] further instructions.    Report your blood sugar to the Short-Stay nurse when you get to Short-Stay.  References:  [facility] 2007 "How to Manage your Diabetes Before and After Surgery".  What do I do about my diabetes medications?   Do not take oral diabetes medicines (pills) the morning of surgery.   Do not wear jewelry, make-up or nail polish.  Do not wear lotions, powders, or perfumes.  You may wear deodorant.  Do not shave 48 hours prior to surgery.    Do not bring valuables to the hospital.  [facility] responsible for any belongings or valuables.  Contacts, dentures or bridgework may not be worn into surgery.  Leave your suitcase in the car.  After surgery it may be brought to your room.  For patients admitted to the hospital, discharge time will be determined by your treatment team.  Patients discharged the day of surgery will not be allowed to drive home.   Special instructions:  See "Preparing for Surgery" Instruction sheet.  Please read over the following fact sheets that you were given. Pain Booklet, Coughing and Deep Breathing and Surgical Site Infection Prevention

## 2015-09-09 ENCOUNTER — Encounter (HOSPITAL_COMMUNITY): Payer: Self-pay

## 2015-09-09 ENCOUNTER — Encounter (HOSPITAL_COMMUNITY)
Admission: RE | Admit: 2015-09-09 | Discharge: 2015-09-09 | Disposition: A | Discharge status: Home / Self Care | Payer: Medicare Other | Source: Ambulatory Visit | Attending: General Surgery | Admitting: General Surgery

## 2015-09-09 DIAGNOSIS — J449 Chronic obstructive pulmonary disease, unspecified: Secondary | ICD-10-CM | POA: Insufficient documentation

## 2015-09-09 DIAGNOSIS — Z7982 Long term (current) use of aspirin: Secondary | ICD-10-CM | POA: Diagnosis not present

## 2015-09-09 DIAGNOSIS — E7800 Pure hypercholesterolemia, unspecified: Secondary | ICD-10-CM | POA: Insufficient documentation

## 2015-09-09 DIAGNOSIS — R9431 Abnormal electrocardiogram [ECG] [EKG]: Secondary | ICD-10-CM | POA: Diagnosis not present

## 2015-09-09 DIAGNOSIS — Z7984 Long term (current) use of oral hypoglycemic drugs: Secondary | ICD-10-CM | POA: Diagnosis not present

## 2015-09-09 DIAGNOSIS — E119 Type 2 diabetes mellitus without complications: Secondary | ICD-10-CM | POA: Diagnosis not present

## 2015-09-09 DIAGNOSIS — Z01812 Encounter for preprocedural laboratory examination: Secondary | ICD-10-CM | POA: Insufficient documentation

## 2015-09-09 DIAGNOSIS — I10 Essential (primary) hypertension: Secondary | ICD-10-CM | POA: Diagnosis not present

## 2015-09-09 DIAGNOSIS — F329 Major depressive disorder, single episode, unspecified: Secondary | ICD-10-CM | POA: Insufficient documentation

## 2015-09-09 DIAGNOSIS — F419 Anxiety disorder, unspecified: Secondary | ICD-10-CM | POA: Diagnosis not present

## 2015-09-09 DIAGNOSIS — Z9981 Dependence on supplemental oxygen: Secondary | ICD-10-CM | POA: Diagnosis not present

## 2015-09-09 DIAGNOSIS — C50912 Malignant neoplasm of unspecified site of left female breast: Secondary | ICD-10-CM | POA: Diagnosis not present

## 2015-09-09 DIAGNOSIS — Z79899 Other long term (current) drug therapy: Secondary | ICD-10-CM | POA: Insufficient documentation

## 2015-09-09 DIAGNOSIS — Z01818 Encounter for other preprocedural examination: Secondary | ICD-10-CM | POA: Diagnosis present

## 2015-09-09 HISTORY — DX: Anxiety disorder, unspecified: F41.9

## 2015-09-09 HISTORY — DX: Pneumonia, unspecified organism: J18.9

## 2015-09-09 HISTORY — DX: Fracture of unspecified carpal bone, unspecified wrist, initial encounter for closed fracture: S62.109A

## 2015-09-09 HISTORY — DX: Complete loss of teeth, unspecified cause, unspecified class: Z97.2

## 2015-09-09 HISTORY — DX: Dependence on supplemental oxygen: Z99.81

## 2015-09-09 HISTORY — DX: Reserved for inherently not codable concepts without codable children: IMO0001

## 2015-09-09 HISTORY — DX: Complete loss of teeth, unspecified cause, unspecified class: K08.109

## 2015-09-09 LAB — BASIC METABOLIC PANEL
Anion gap: 11 (ref 5–15)
BUN: 6 mg/dL (ref 6–20)
CO2: 25 mmol/L (ref 22–32)
Calcium: 9.4 mg/dL (ref 8.9–10.3)
Chloride: 99 mmol/L — ABNORMAL LOW (ref 101–111)
Creatinine, Ser: 0.81 mg/dL (ref 0.44–1.00)
GFR calc Af Amer: >60 mL/min (ref >60)
GFR calc non Af Amer: >60 mL/min (ref >60)
Glucose, Bld: 266 mg/dL — ABNORMAL HIGH (ref 65–99)
Potassium: 4.6 mmol/L (ref 3.5–5.1)
Sodium: 135 mmol/L (ref 135–145)

## 2015-09-09 LAB — CBC
HCT: 43.9 % (ref 36.0–46.0)
Hemoglobin: 14.4 g/dL (ref 12.0–15.0)
MCH: 29.2 pg (ref 26.0–34.0)
MCHC: 32.8 g/dL (ref 30.0–36.0)
MCV: 89 fL (ref 78.0–100.0)
Platelets: 317 10*3/uL (ref 150–400)
RBC: 4.93 MIL/uL (ref 3.87–5.11)
RDW: 14.4 % (ref 11.5–15.5)
WBC: 6.4 10*3/uL (ref 4.0–10.5)

## 2015-09-09 LAB — GLUCOSE, CAPILLARY: Glucose-Capillary: 291 mg/dL — ABNORMAL HIGH (ref 65–99)

## 2015-09-09 NOTE — Progress Notes (Signed)
Patient's CBG at PAT was 291.  Patient stated she hasn't eaten anything since 8pm last night.  Patient also says she is unaware of what her fasting blood sugar is and what her last A1C was.  She says her doctor tells her to walk if her blood sugar is high.  Her PCP is  Melinda Crutch, MD and he manages her diabetes.   Patient denies ever having an EKG, Echocardiogram, Stress test, Cardiac Catheterization or ever seeing a cardiologist.

## 2015-09-10 LAB — HEMOGLOBIN A1C
Hgb A1c MFr Bld: 8.9 % — ABNORMAL HIGH (ref 4.8–5.6)
Mean Plasma Glucose: 209 mg/dL

## 2015-09-10 NOTE — Progress Notes (Addendum)
Anesthesia Chart Review: Patient is a 74 year old female scheduled for radioactive seed localized left breast lumpectomy and left axillary sentinel lymph node biopsy on 09/17/2015 by Dr. Excell Seltzer. Seed implant is scheduled for 09/15/14.  History includes left breast cancer, smoking, diabetes mellitus type 2, hypercholesterolemia, hypertension, COPD with home O2, depression, anxiety, dentures, hysterectomy, appendectomy, cholecystectomy, tonsillectomy. PCP is Dr. Nathanial Millman. Oncologists are Dr. Lindi Adie and Dr. Valere Dross. Notes in Epic indicate she was evaluated by cardiologist Dr. Irish Lack in 2014, records pending.  Meds include Precose, aspirin 81 mg, Wellbutrin XL, vitamin D, glipizide, metformin, pravastatin, quinapril, verapamil.  09/09/2015 EKG: Normal sinus rhythm, septal infarct (age undetermined). No significant change since last tracing 11/25/09.  Preoperative labs noted. CBG 291. Glucose 266, A1c 8.9 consistent with mean plasma glucose of 209. Cr 0.81. CBC WNL.   I left a voice message for patient to call to me as I wanted to discuss home glucose control. I follow-up once I hear back from patient and receive old records from Dr. Irish Lack.  George Hugh [facility] Short Stay Center/Anesthesiology Phone [phone] 09/10/2015 6:06 PM  Addendum: Patient called back yesterady. She typically does not check a fasting glucose. She usually checks her glucose after breakfast and DM meds with results ranging ~ 130-170. The morning of PAT she had only had coffee with creamer. She checked her glucose which she was on the phone with me and results was 221 after coffee with creamer. We reviewed her A1c results. She's note sure how this compares with prior results. We discussed DM diet compliance and checking fasting CBG at home over the next several days and contacting me if consistently > 200. She will also need follow-up with Dr. Harrington Challenger. She has been compliant with DM medications. I did tell her that  ideally fasting CBG before surgery should be 200 or less, and if much over 200 then her surgery could be delayed or even canceled. I also notified triage nurse Raquel Sarna at [facility] to forward glucose/a1C results to Dr. Excell Seltzer for review and additional recommendations, if any.   If regarding to her seeing Dr. Irish Lack, she reported she was referred to him for leg weakness, not "heart issues." Records received from the former [facility] Cardiology office. She was evaluated for intermittent claudication and started on Cilostazol. She was not interested in on-going follow-up at his office. Tests done there were:  - 03/09/13 ETT: Exercise limited by claudication. Poor exercise tolerance. No clear ischemia.   - 03/09/13 LE arterial duplex/ABI: Conclusions. Monophasic waveforms throughout the RLE suggestive of more proximal iliac disease. Monophasic waveforms in the LLE from the mid SFA to the tibial vessels. Iliac waveforms are barely biphasic, suggestive of more proximal iliac disease. Resting ABIs suggest moderate disease bilaterally. Suggest angiogram if the patient has refractory claudication.   She denied any CP, SOB at rest. No new respiratory symptoms. She will need a CBG on arrival. If results are acceptable then would anticipate that she could proceed as planned.  George Hugh [facility] Short Stay Center/Anesthesiology Phone [phone] 09/12/2015 3:00 PM

## 2015-09-11 ENCOUNTER — Other Ambulatory Visit: Payer: Self-pay | Admitting: General Surgery

## 2015-09-16 MED ORDER — CHLORHEXIDINE GLUCONATE 4 % EX LIQD: 1.0000 "application " | Freq: Once | CUTANEOUS | Status: DC

## 2015-09-16 MED ORDER — CEFAZOLIN SODIUM-DEXTROSE 2-3 GM-% IV SOLR
2.0000 g | INTRAVENOUS | Status: AC
  Administered 2015-09-17: 2 g via INTRAVENOUS
  Filled 2015-09-16: qty 50

## 2015-09-17 ENCOUNTER — Encounter (HOSPITAL_COMMUNITY)
Admission: RE | Disposition: A | Discharge status: Home / Self Care | Payer: Medicare Other | Source: Ambulatory Visit | Attending: General Surgery

## 2015-09-17 ENCOUNTER — Ambulatory Visit (HOSPITAL_COMMUNITY)
Admission: RE | Admit: 2015-09-17 | Discharge: 2015-09-17 | Disposition: A | Discharge status: Home / Self Care | Payer: Medicare Other | Source: Ambulatory Visit | Attending: General Surgery | Admitting: General Surgery

## 2015-09-17 ENCOUNTER — Encounter (HOSPITAL_COMMUNITY): Payer: Self-pay | Admitting: *Deleted

## 2015-09-17 ENCOUNTER — Ambulatory Visit (HOSPITAL_COMMUNITY): Payer: Medicare Other | Admitting: Vascular Surgery

## 2015-09-17 ENCOUNTER — Ambulatory Visit (HOSPITAL_COMMUNITY): Payer: Medicare Other | Admitting: Anesthesiology

## 2015-09-17 DIAGNOSIS — Z7984 Long term (current) use of oral hypoglycemic drugs: Secondary | ICD-10-CM | POA: Diagnosis not present

## 2015-09-17 DIAGNOSIS — I252 Old myocardial infarction: Secondary | ICD-10-CM | POA: Diagnosis not present

## 2015-09-17 DIAGNOSIS — E119 Type 2 diabetes mellitus without complications: Secondary | ICD-10-CM | POA: Insufficient documentation

## 2015-09-17 DIAGNOSIS — C50912 Malignant neoplasm of unspecified site of left female breast: Secondary | ICD-10-CM | POA: Diagnosis present

## 2015-09-17 DIAGNOSIS — J449 Chronic obstructive pulmonary disease, unspecified: Secondary | ICD-10-CM | POA: Diagnosis not present

## 2015-09-17 DIAGNOSIS — Z170 Estrogen receptor positive status [ER+]: Secondary | ICD-10-CM | POA: Diagnosis not present

## 2015-09-17 DIAGNOSIS — F17200 Nicotine dependence, unspecified, uncomplicated: Secondary | ICD-10-CM | POA: Diagnosis not present

## 2015-09-17 DIAGNOSIS — C50512 Malignant neoplasm of lower-outer quadrant of left female breast: Secondary | ICD-10-CM | POA: Insufficient documentation

## 2015-09-17 DIAGNOSIS — C50412 Malignant neoplasm of upper-outer quadrant of left female breast: Secondary | ICD-10-CM

## 2015-09-17 HISTORY — PX: RADIOACTIVE SEED GUIDED PARTIAL MASTECTOMY WITH AXILLARY SENTINEL LYMPH NODE BIOPSY: SHX6520

## 2015-09-17 LAB — GLUCOSE, CAPILLARY
Glucose-Capillary: 212 mg/dL — ABNORMAL HIGH (ref 65–99)
Glucose-Capillary: 254 mg/dL — ABNORMAL HIGH (ref 65–99)

## 2015-09-17 SURGERY — RADIOACTIVE SEED GUIDED PARTIAL MASTECTOMY WITH AXILLARY SENTINEL LYMPH NODE BIOPSY
Anesthesia: Regional | Site: Breast | Laterality: Left

## 2015-09-17 MED ORDER — FENTANYL CITRATE (PF) 100 MCG/2ML IJ SOLN
INTRAMUSCULAR | Status: AC
  Administered 2015-09-17: 50 ug via INTRAVENOUS
  Filled 2015-09-17: qty 2

## 2015-09-17 MED ORDER — OXYCODONE HCL 5 MG PO TABS: 5.0000 mg | ORAL_TABLET | Freq: Once | ORAL | Status: DC | PRN

## 2015-09-17 MED ORDER — HYDROCODONE-ACETAMINOPHEN 5-325 MG PO TABS: 1.0000 | ORAL_TABLET | ORAL | Status: DC | PRN

## 2015-09-17 MED ORDER — LACTATED RINGERS IV SOLN
INTRAVENOUS | Status: DC
  Administered 2015-09-17 (×3): via INTRAVENOUS

## 2015-09-17 MED ORDER — ONDANSETRON HCL 4 MG/2ML IJ SOLN
INTRAMUSCULAR | Status: AC
  Filled 2015-09-17: qty 2

## 2015-09-17 MED ORDER — FENTANYL CITRATE (PF) 100 MCG/2ML IJ SOLN
50.0000 ug | Freq: Once | INTRAMUSCULAR | Status: AC
  Administered 2015-09-17: 50 ug via INTRAVENOUS
  Filled 2015-09-17: qty 1

## 2015-09-17 MED ORDER — SUCCINYLCHOLINE CHLORIDE 20 MG/ML IJ SOLN
INTRAMUSCULAR | Status: AC
  Filled 2015-09-17: qty 1

## 2015-09-17 MED ORDER — MIDAZOLAM HCL 2 MG/2ML IJ SOLN
INTRAMUSCULAR | Status: AC
  Filled 2015-09-17: qty 2

## 2015-09-17 MED ORDER — BUPIVACAINE-EPINEPHRINE (PF) 0.5% -1:200000 IJ SOLN
INTRAMUSCULAR | Status: DC | PRN
  Administered 2015-09-17: 30 mL via PERINEURAL

## 2015-09-17 MED ORDER — FENTANYL CITRATE (PF) 100 MCG/2ML IJ SOLN: 25.0000 ug | INTRAMUSCULAR | Status: DC | PRN

## 2015-09-17 MED ORDER — ONDANSETRON HCL 4 MG/2ML IJ SOLN
INTRAMUSCULAR | Status: DC | PRN
  Administered 2015-09-17: 4 mg via INTRAVENOUS

## 2015-09-17 MED ORDER — MIDAZOLAM HCL 2 MG/2ML IJ SOLN
2.0000 mg | Freq: Once | INTRAMUSCULAR | Status: AC
  Administered 2015-09-17: 2 mg via INTRAVENOUS

## 2015-09-17 MED ORDER — FENTANYL CITRATE (PF) 100 MCG/2ML IJ SOLN
INTRAMUSCULAR | Status: AC
  Administered 2015-09-17: 100 ug via INTRAVENOUS
  Filled 2015-09-17: qty 2

## 2015-09-17 MED ORDER — MIDAZOLAM HCL 2 MG/2ML IJ SOLN
INTRAMUSCULAR | Status: AC
  Administered 2015-09-17: 2 mg via INTRAVENOUS
  Filled 2015-09-17: qty 2

## 2015-09-17 MED ORDER — TECHNETIUM TC 99M SULFUR COLLOID FILTERED
1.0000 | Freq: Once | INTRAVENOUS | Status: AC | PRN
  Administered 2015-09-17: 1 via INTRADERMAL

## 2015-09-17 MED ORDER — BUPIVACAINE-EPINEPHRINE (PF) 0.25% -1:200000 IJ SOLN
INTRAMUSCULAR | Status: AC
  Filled 2015-09-17: qty 30

## 2015-09-17 MED ORDER — ACETAMINOPHEN 160 MG/5ML PO SOLN
325.0000 mg | ORAL | Status: DC | PRN
  Filled 2015-09-17: qty 20.3

## 2015-09-17 MED ORDER — LIDOCAINE HCL (CARDIAC) 20 MG/ML IV SOLN
INTRAVENOUS | Status: AC
  Filled 2015-09-17: qty 5

## 2015-09-17 MED ORDER — 0.9 % SODIUM CHLORIDE (POUR BTL) OPTIME
TOPICAL | Status: DC | PRN
  Administered 2015-09-17: 1000 mL

## 2015-09-17 MED ORDER — ONDANSETRON 8 MG PO TBDP
8.0000 mg | ORAL_TABLET | Freq: Once | ORAL | Status: AC
  Administered 2015-09-17: 8 mg via ORAL
  Filled 2015-09-17: qty 1

## 2015-09-17 MED ORDER — PROPOFOL 10 MG/ML IV BOLUS
INTRAVENOUS | Status: DC | PRN
Start: 2015-09-17 — End: 2015-09-17
  Administered 2015-09-17: 40 mg via INTRAVENOUS
  Administered 2015-09-17: 130 mg via INTRAVENOUS
  Administered 2015-09-17: 30 mg via INTRAVENOUS

## 2015-09-17 MED ORDER — SUCCINYLCHOLINE CHLORIDE 20 MG/ML IJ SOLN
INTRAMUSCULAR | Status: DC | PRN
  Administered 2015-09-17: 60 mg via INTRAVENOUS

## 2015-09-17 MED ORDER — METHYLENE BLUE 1 % INJ SOLN
INTRAMUSCULAR | Status: DC | PRN
  Administered 2015-09-17: 5 mL

## 2015-09-17 MED ORDER — ACETAMINOPHEN 325 MG PO TABS: 325.0000 mg | ORAL_TABLET | ORAL | Status: DC | PRN

## 2015-09-17 MED ORDER — FENTANYL CITRATE (PF) 100 MCG/2ML IJ SOLN
100.0000 ug | Freq: Once | INTRAMUSCULAR | Status: AC
  Administered 2015-09-17: 50 ug via INTRAVENOUS
  Administered 2015-09-17: 100 ug via INTRAVENOUS

## 2015-09-17 MED ORDER — FENTANYL CITRATE (PF) 250 MCG/5ML IJ SOLN
INTRAMUSCULAR | Status: AC
  Filled 2015-09-17: qty 5

## 2015-09-17 MED ORDER — LIDOCAINE HCL (CARDIAC) 20 MG/ML IV SOLN
INTRAVENOUS | Status: DC | PRN
Start: 2015-09-17 — End: 2015-09-17
  Administered 2015-09-17: 60 mg via INTRAVENOUS

## 2015-09-17 MED ORDER — PROPOFOL 10 MG/ML IV BOLUS
INTRAVENOUS | Status: AC
  Filled 2015-09-17: qty 40

## 2015-09-17 MED ORDER — SODIUM CHLORIDE 0.9 % IJ SOLN
INTRAMUSCULAR | Status: AC
  Filled 2015-09-17: qty 10

## 2015-09-17 MED ORDER — BUPIVACAINE-EPINEPHRINE 0.25% -1:200000 IJ SOLN
INTRAMUSCULAR | Status: DC | PRN
  Administered 2015-09-17: 10 mL

## 2015-09-17 MED ORDER — OXYCODONE HCL 5 MG/5ML PO SOLN: 5.0000 mg | Freq: Once | ORAL | Status: DC | PRN

## 2015-09-17 MED ORDER — METHYLENE BLUE 1 % INJ SOLN
INTRAMUSCULAR | Status: AC
  Filled 2015-09-17: qty 10

## 2015-09-17 SURGICAL SUPPLY — 44 items
APPLIER CLIP 9.375 MED OPEN (MISCELLANEOUS) ×2
BINDER BREAST LRG (GAUZE/BANDAGES/DRESSINGS) ×2 IMPLANT
BINDER BREAST XLRG (GAUZE/BANDAGES/DRESSINGS) IMPLANT
BLADE SURG 15 STRL LF DISP TIS (BLADE) ×1 IMPLANT
BLADE SURG 15 STRL SS (BLADE) ×1
CANISTER SUCTION 2500CC (MISCELLANEOUS) ×2 IMPLANT
CHLORAPREP W/TINT 26ML (MISCELLANEOUS) ×2 IMPLANT
CLIP APPLIE 9.375 MED OPEN (MISCELLANEOUS) ×1 IMPLANT
COVER PROBE W GEL 5X96 (DRAPES) ×2 IMPLANT
COVER SURGICAL LIGHT HANDLE (MISCELLANEOUS) ×2 IMPLANT
DEVICE DUBIN SPECIMEN MAMMOGRA (MISCELLANEOUS) ×2 IMPLANT
DRAPE CHEST BREAST 15X10 FENES (DRAPES) ×2 IMPLANT
DRAPE UTILITY XL STRL (DRAPES) ×2 IMPLANT
ELECT COATED BLADE 2.86 ST (ELECTRODE) ×2 IMPLANT
ELECT REM PT RETURN 9FT ADLT (ELECTROSURGICAL) ×2
ELECTRODE REM PT RTRN 9FT ADLT (ELECTROSURGICAL) ×1 IMPLANT
GLOVE BIOGEL PI IND STRL 7.0 (GLOVE) ×1 IMPLANT
GLOVE BIOGEL PI IND STRL 8 (GLOVE) ×1 IMPLANT
GLOVE BIOGEL PI INDICATOR 7.0 (GLOVE) ×1
GLOVE BIOGEL PI INDICATOR 8 (GLOVE) ×1
GLOVE ECLIPSE 7.5 STRL STRAW (GLOVE) ×2 IMPLANT
GLOVE SURG SS PI 7.0 STRL IVOR (GLOVE) ×2 IMPLANT
GOWN STRL REUS W/ TWL LRG LVL3 (GOWN DISPOSABLE) ×1 IMPLANT
GOWN STRL REUS W/ TWL XL LVL3 (GOWN DISPOSABLE) ×1 IMPLANT
GOWN STRL REUS W/TWL LRG LVL3 (GOWN DISPOSABLE) ×1
GOWN STRL REUS W/TWL XL LVL3 (GOWN DISPOSABLE) ×1
KIT BASIN OR (CUSTOM PROCEDURE TRAY) ×2 IMPLANT
KIT MARKER MARGIN INK (KITS) ×2 IMPLANT
LIQUID BAND (GAUZE/BANDAGES/DRESSINGS) ×4 IMPLANT
NDL SAFETY ECLIPSE 18X1.5 (NEEDLE) IMPLANT
NEEDLE HYPO 18GX1.5 SHARP (NEEDLE)
NEEDLE HYPO 25X1 1.5 SAFETY (NEEDLE) ×2 IMPLANT
NS IRRIG 1000ML POUR BTL (IV SOLUTION) IMPLANT
PACK SURGICAL SETUP 50X90 (CUSTOM PROCEDURE TRAY) ×2 IMPLANT
PENCIL BUTTON HOLSTER BLD 10FT (ELECTRODE) ×2 IMPLANT
SPONGE LAP 18X18 X RAY DECT (DISPOSABLE) ×2 IMPLANT
SUT MON AB 5-0 PS2 18 (SUTURE) ×4 IMPLANT
SUT VIC AB 3-0 SH 18 (SUTURE) ×2 IMPLANT
SYR BULB 3OZ (MISCELLANEOUS) ×2 IMPLANT
SYR CONTROL 10ML LL (SYRINGE) ×2 IMPLANT
TOWEL OR 17X24 6PK STRL BLUE (TOWEL DISPOSABLE) ×2 IMPLANT
TOWEL OR 17X26 10 PK STRL BLUE (TOWEL DISPOSABLE) ×2 IMPLANT
TUBE CONNECTING 12X1/4 (SUCTIONS) ×2 IMPLANT
YANKAUER SUCT BULB TIP NO VENT (SUCTIONS) ×2 IMPLANT

## 2015-09-17 NOTE — Interval H&P Note (Signed)
History and Physical Interval Note:  09/17/2015 10:37 AM  Dawn [name]  has presented today for surgery, with the diagnosis of cancer left breast  The various methods of treatment have been discussed with the patient and family. After consideration of risks, benefits and other options for treatment, the patient has consented to  Procedure(s): RADIOACTIVE SEED LOCALIZATION LEFT BREAST LUMPECTOMY AND LEFT AXILLARY SENTINEL LYMPH NODE BIOPSY (Left) as a surgical intervention .  The patient's history has been reviewed, patient examined, no change in status, stable for surgery.  I have reviewed the patient's chart and labs.  Questions were answered to the patient's satisfaction.     Raidyn Breiner T

## 2015-09-17 NOTE — Discharge Instructions (Signed)
[facility] °Office Phone Number [phone] ° °BREAST BIOPSY/ PARTIAL MASTECTOMY: POST OP INSTRUCTIONS ° °Always review your discharge instruction sheet given to you by the facility where your surgery was performed. ° °IF YOU HAVE DISABILITY OR FAMILY LEAVE FORMS, YOU MUST BRING THEM TO THE OFFICE FOR PROCESSING.  DO NOT GIVE THEM TO YOUR DOCTOR. ° °1. A prescription for pain medication may be given to you upon discharge.  Take your pain medication as prescribed, if needed.  If narcotic pain medicine is not needed, then you may take acetaminophen (Tylenol) or ibuprofen (Advil) as needed. °2. Take your usually prescribed medications unless otherwise directed °3. If you need a refill on your pain medication, please contact your pharmacy.  They will contact our office to request authorization.  Prescriptions will not be filled after 5pm or on week-ends. °4. You should eat very light the first 24 hours after surgery, such as soup, crackers, pudding, etc.  Resume your normal diet the day after surgery. °5. Most patients will experience some swelling and bruising in the breast.  Ice packs and a good support bra will help.  Swelling and bruising can take several days to resolve.  °6. It is common to experience some constipation if taking pain medication after surgery.  Increasing fluid intake and taking a stool softener will usually help or prevent this problem from occurring.  A mild laxative (Milk of Magnesia or Miralax) should be taken according to package directions if there are no bowel movements after 48 hours. °7. Unless discharge instructions indicate otherwise, you may remove your bandages 24-48 hours after surgery, and you may shower at that time.  You may have steri-strips (small skin tapes) in place directly over the incision.  These strips should be left on the skin for 7-10 days.  If your surgeon used skin glue on the incision, you may shower in 24 hours.  The glue will flake off over the  next 2-3 weeks.  Any sutures or staples will be removed at the office during your follow-up visit. °8. ACTIVITIES:  You may resume regular daily activities (gradually increasing) beginning the next day.  Wearing a good support bra or sports bra minimizes pain and swelling.  You may have sexual intercourse when it is comfortable. °a. You may drive when you no longer are taking prescription pain medication, you can comfortably wear a seatbelt, and you can safely maneuver your car and apply brakes. °b. RETURN TO WORK:  ______________________________________________________________________________________ °9. You should see your doctor in the office for a follow-up appointment approximately two weeks after your surgery.  Your doctor’s nurse will typically make your follow-up appointment when she calls you with your pathology report.  Expect your pathology report 2-3 business days after your surgery.  You may call to check if you do not hear from us after three days. °10. OTHER INSTRUCTIONS: _______________________________________________________________________________________________ _____________________________________________________________________________________________________________________________________ °_____________________________________________________________________________________________________________________________________ °_____________________________________________________________________________________________________________________________________ ° °WHEN TO CALL YOUR DOCTOR: °1. Fever over 101.0 °2. Nausea and/or vomiting. °3. Extreme swelling or bruising. °4. Continued bleeding from incision. °5. Increased pain, redness, or drainage from the incision. ° °The clinic staff is available to answer your questions during regular business hours.  Please don’t hesitate to call and ask to speak to one of the nurses for clinical concerns.  If you have a medical emergency, go to the nearest  emergency room or call 911.  A surgeon from [facility] is always on call at the hospital. ° °For further questions, please visit [email or web address]  °

## 2015-09-17 NOTE — Anesthesia Procedure Notes (Addendum)
Anesthesia Regional Block:  Pectoralis block  Pre-Anesthetic Checklist: ,, timeout performed, Correct Patient, Correct Site, Correct Laterality, Correct Procedure, Correct Position, site marked, Risks and benefits discussed,  Surgical consent,  Pre-op evaluation,  At surgeon's request and post-op pain management  Laterality: Left  Prep: chloraprep       Needles:  Injection technique: Single-shot  Needle Type: Echogenic Stimulator Needle          Additional Needles:  Procedures: ultrasound guided (picture in chart) Pectoralis block Narrative:  Injection made incrementally with aspirations every 5 mL.  Performed by: Personally  Anesthesiologist: MOSER, CHRISTOPHER  Additional Notes: H+P and labs reviewed, risks and benefits discussed with patient, procedure tolerated well without complications   Procedure Name: Intubation Date/Time: 09/17/2015 11:10 AM Performed by: Scheryl Darter Pre-anesthesia Checklist: Patient identified, Emergency Drugs available, Suction available, Patient being monitored and Timeout performed Patient Re-evaluated:Patient Re-evaluated prior to inductionOxygen Delivery Method: Circle system utilized Preoxygenation: Pre-oxygenation with 100% oxygen Intubation Type: IV induction and Rapid sequence Laryngoscope Size: Miller and 2 Grade View: Grade I Tube type: Oral Tube size: 7.0 mm Number of attempts: 1 Airway Equipment and Method: Stylet Secured at: 21 cm Tube secured with: Tape Dental Injury: Teeth and Oropharynx as per pre-operative assessment

## 2015-09-17 NOTE — H&P (Signed)
History of Present Illness Dawn Kitchen T. [name] MD; 08/20/2015 2:07 PM) The patient is a 75 year old female who presents with breast cancer. Patint is a 74 year old post menopausal female referred by Dr. Marcelo Baldy for evaluation of recently diagnosed carcinoma of the left breast. she has a history of right breast pain and a previous benign right breast core biopsy. She recently presented for a screening mamogram which apparently was initially not done due to breast pain.. Subsequent imaging included bilateral breast ultrasound showing a 2.5 cm hypoechoic suspicious left breast mass in the upper outer quadrant middle depth. following this bilateral mammogram was able to be performed with limited compression showing an asymmetric density and calcifications in the site of the abnormality on ultrasound. An ultrasound guided breast biopsy was performed on 08/14/2015 with pathology revealing invasive ductal carcinoma of the breast. She is seen now in the breast multidisciplinary clinic for initial treatment planning. She has experienced no left breast symptoms, specifically lump, pain, nipple discharge or skin changes.. She does not have a personal history of previous breast problems as noted above.  Findings at that time were the following: Tumor size: 2.5 cm Tumor grade: 1-2 Estrogen Receptor: positive Progesterone Receptor: positive Her-2 neu: negative Lymph node status: negative    Other Problems Dawn [name], CMA; 08/20/2015 8:50 AM) Alcohol Abuse Anxiety Disorder Bladder Problems Breast Cancer Cholelithiasis Chronic Obstructive Lung Disease Depression Diabetes Mellitus High blood pressure Home Oxygen Use Lump In Breast Myocardial infarction Oophorectomy  Past Surgical History Dawn [name], [name]; 08/20/2015 8:50 AM) Appendectomy Breast Biopsy Bilateral. multiple Colon Polyp Removal - Colonoscopy Gallbladder Surgery - Open Hysterectomy (not due to cancer) -  Complete Oral Surgery Tonsillectomy  Diagnostic Studies History Dawn [name], [name]; 08/20/2015 8:50 AM) Colonoscopy 5-10 years ago Mammogram within last year Pap Smear >5 years ago  Medication History Dawn [name], [name]; 08/20/2015 8:50 AM) No Current Medications Medications Reconciled  Social History Dawn [name], CMA; 08/20/2015 8:50 AM) Alcohol use Occasional alcohol use. Caffeine use Coffee. No drug use Tobacco use Current every day smoker.  Family History Dawn [name], [name]; 08/20/2015 8:50 AM) Arthritis Mother. Cancer Family Members In General. Colon Cancer Sister. Colon Polyps Sister. Respiratory Condition Sister.  Pregnancy / Birth History Dawn [name], [name]; 08/20/2015 8:50 AM) Age at menarche 7 years. Age of menopause <46 Gravida 1 Irregular periods Maternal age 71-20 Para 1    Review of Systems Dawn [name] CMA; 08/20/2015 8:50 AM) General Not Present- Appetite Loss, Chills, Fatigue, Fever, Night Sweats, Weight Gain and Weight Loss. Skin Present- Dryness. Not Present- Change in Wart/Mole, Hives, Jaundice, New Lesions, Non-Healing Wounds, Rash and Ulcer. HEENT Present- Seasonal Allergies and Wears glasses/contact lenses. Not Present- Earache, Hearing Loss, Hoarseness, Nose Bleed, Oral Ulcers, Ringing in the Ears, Sinus Pain, Sore Throat, Visual Disturbances and Yellow Eyes. Respiratory Present- Difficulty Breathing. Not Present- Bloody sputum, Chronic Cough, Snoring and Wheezing. Breast Present- Breast Pain. Not Present- Breast Mass, Nipple Discharge and Skin Changes. Cardiovascular Not Present- Chest Pain, Difficulty Breathing Lying Down, Leg Cramps, Palpitations, Rapid Heart Rate, Shortness of Breath and Swelling of Extremities. Gastrointestinal Not Present- Abdominal Pain, Bloating, Bloody Stool, Change in Bowel Habits, Chronic diarrhea, Constipation, Difficulty Swallowing, Excessive gas, Gets full quickly at meals, Hemorrhoids,  Indigestion, Nausea, Rectal Pain and Vomiting. Female Genitourinary Present- Urgency. Not Present- Frequency, Nocturia, Painful Urination and Pelvic Pain. Musculoskeletal Not Present- Back Pain, Joint Pain, Joint Stiffness, Muscle Pain, Muscle Weakness and Swelling of Extremities. Neurological Present- Trouble walking. Not Present- Decreased Memory,  Fainting, Headaches, Numbness, Seizures, Tingling, Tremor and Weakness. Psychiatric Present- Anxiety, Change in Sleep Pattern, Depression and Fearful. Not Present- Bipolar and Frequent crying. Endocrine Present- Cold Intolerance. Not Present- Excessive Hunger, Hair Changes, Heat Intolerance, Hot flashes and New Diabetes. Hematology Not Present- Easy Bruising, Excessive bleeding, Gland problems, HIV and Persistent Infections.   Physical Exam Dawn Kitchen T. Haila Dena MD; 08/20/2015 2:09 PM) The physical exam findings are as follows: Note:General: Alert, elderly Caucasian female, in no distress Skin: Warm and dry without rash or infection. HEENT: No palpable masses or thyromegaly. Sclera nonicteric. Pupils equal round and reactive. Lymph nodes: No cervical, supraclavicular, or inguinal nodes palpable. breasts: Some thickening upper outer left breast which may be post biopsy change. No other palpable abnormalities in either breast. No palpable axillary adenopathy. Lungs: Breath sounds distant but clear and equal. No wheezing or increased work of breathing. Cardiovascular: Regular rate and rhythm without murmer. No JVD or edema. Femoral pulses are palpable but I cannot feel pedal pulses and lower extremity skin shows some atrophy. Abdomen: Nondistended. Soft and nontender. No masses palpable. No organomegaly. No palpable hernias. Extremities: No edema or joint swelling or deformity. No chronic venous stasis changes. Neurologic: Alert and fully oriented. Gait normal. No focal weakness. Psychiatric: Normal mood and affect. Thought content appropriate with  normal judgement and insight    Assessment & Plan Dawn Kitchen T. Eulonda Andalon MD; 08/20/2015 2:17 PM) MALIGNANT NEOPLASM OF LOWER-OUTER QUADRANT OF LEFT FEMALE BREAST (C50.512) Impression: 75 year old female with a new diagnosis of cancer of the left breast, upper outer quadrant. Clinical stage 1c, ER ppositive, PR positive, HER-2 negative. I discussed with the patient and family members present today initial surgical treatment options. We discussed options of breast conservation with lumpectomy or total mastectomy and sentinal lymph node biopsy/dissection. Options for reconstruction were discussed. After discussion they have elected to proceed with breast conservation with lumpectomy and sentinel lymph node biopsy. We discussed the indications and nature of the procedure, and expected recovery, in detail. Surgical risks including anesthetic complications, cardiorespiratory complications, bleeding, infection, wound healing complications, blood clots, lymphedema, local and distant recurrence and possible need for further surgery based on the final pathology was discussed and understood. Chemotherapy, hormonal therapy and radiation therapy have been discussed. They have been provided with literature regarding the treatment of breast cancer. All questions were answered. They understand and agree to proceed and we will go ahead with scheduling. She has some significant comorbidities including diabetes, COPD and history of MI. We will ask anesthesia to see in preoperative consult Current Plans Schedule for Surgery radioactive seed localized left breast lumpectomy and left axillary sentinel lymph node biopsy. Plan preoperative anesthesia consult.  Pt Education - [facility] Breast Biopsy HCI: discussed with patient and provided information.

## 2015-09-17 NOTE — Anesthesia Preprocedure Evaluation (Signed)
Anesthesia Evaluation  Patient identified by MRN, date of birth, ID band Patient awake    Reviewed: Allergy & Precautions, NPO status , Patient's Chart, lab work & pertinent test results  History of Anesthesia Complications Negative for: history of anesthetic complications  Airway Mallampati: II  TM Distance: >3 FB Neck ROM: Full    Dental  (+) Edentulous Upper, Edentulous Lower   Pulmonary neg shortness of breath, neg sleep apnea, COPD,  COPD inhaler, neg recent URI, Current Smoker, neg PE   breath sounds clear to auscultation       Cardiovascular hypertension, Pt. on medications (-) angina(-) CHF  Rhythm:Regular     Neuro/Psych PSYCHIATRIC DISORDERS Anxiety Depression negative neurological ROS     GI/Hepatic negative GI ROS, Neg liver ROS,   Endo/Other  diabetes, Type 2, Oral Hypoglycemic Agents  Renal/GU      Musculoskeletal   Abdominal   Peds  Hematology negative hematology ROS (+)   Anesthesia Other Findings   Reproductive/Obstetrics                             Anesthesia Physical Anesthesia Plan  ASA: III  Anesthesia Plan: General and Regional   Post-op Pain Management: MAC Combined w/ Regional for Post-op pain   Induction: Intravenous  Airway Management Planned: LMA  Additional Equipment: None  Intra-op Plan:   Post-operative Plan: Extubation in OR  Informed Consent: I have reviewed the patients History and Physical, chart, labs and discussed the procedure including the risks, benefits and alternatives for the proposed anesthesia with the patient or authorized representative who has indicated his/her understanding and acceptance.     Plan Discussed with: CRNA and Surgeon  Anesthesia Plan Comments:         Anesthesia Quick Evaluation

## 2015-09-17 NOTE — Progress Notes (Signed)
[facility] AT [place].

## 2015-09-17 NOTE — Op Note (Signed)
Preoperative Diagnosis: cancer left breast  Postoprative Diagnosis: cancer left breast  Procedure: Procedure(s): BLUE DYE INJECTION LEFT BREAST, RADIOACTIVE SEED LOCALIZATION LEFT BREAST LUMPECTOMY AND LEFT AXILLARY SENTINEL LYMPH NODE BIOPSY   Surgeon: Excell Seltzer T   Assistants: none  Anesthesia:  General LMA anesthesia  Indications: patient is a 75 year old female with a recent diagnosis of stage IIA, clinical T2 N0 M0 cancer of the upper outer left breast. After extensive preoperative workup and discussion detailed elsewhere we have elected to proceed with radioactive seed localized left breast lumpectomy and left axillary sentinel lymph node biopsy as initial surgical therapy  Procedure Detail:  Patient had previously undergone accurate placement of a radioactive seed at the tumor and clip site in the upper outer left breast. Seed placement was confirmed in the holding area. Also in the holding area she underwent a pectoral block by anesthesia and underwent injection of 1 mCi of technetium sulfur colloid intradermally around the left nipple. The patient was taken to the operating room, placed in the supine position on the operating table, and laryngeal mask general anesthesia was induced. She received preoperative IV antibiotics. Under sterile technique and after patient timeout I injected 5 mL of dilute methylene blue subcutaneously beneath the left nipple and massaged this for several minutes. Following this the entire left chest and axilla and upper arm were widely sterilely prepped and draped. Patient timeout was again performed. I made a curvilinear incision in a skin crease overlying the tumor site identified with the neoprobe. Fairly thin skin and subcutaneous flaps were then raised in all directions due to the superficial tumor. Using the neoprobe for guidance I did a generous lumpectomy with cautery excising about a 5 cm globular portion of breast tissue. This was inked for  margins. Specimen mammogram was obtained showing the seed and marking clip centrally within the specimen. This was sent for permanent pathology. Hemostasis was assured. The lumpectomy cavity was marked with clips. Breast is simultaneous tissue was closed with interrupted 3-0 Vicryl. Attention was turned to the sentinel node. A hot area in the left axilla was located with the neoprobe and a small transverse incision made. Dissection was carried down through the subcutaneous tissue with cautery and the clavipectoral fascia identified and incised. Using the neoprobe for guidance I bluntly dissected down onto a node that was enlarged at about 1-1/2-2 cm but soft. It was completely excised with cautery and ex vivo had counts in excess of 500 as well as blue dye. At this point I could not feel any other nodes within the axilla and background counts were essentially 0. No other blue dye identified. Hemostasis was assured in the deep subcutaneous tissue closed with interrupted 3-0 Vicryl. Both incisions were infiltrated with Marcaine with epinephrine. Skin was closed with subcuticular 5-0 Monocryl and Liquiban. Sponge needle and instrument counts were correct.    Findings: As above  Estimated Blood Loss:  Minimal         Drains: none  Blood Given: none          Specimens: #1 left breast lumpectomy  #2 left axillary sentinel lymph node biopsy        Complications:  * No complications entered in OR log *         Disposition: PACU - hemodynamically stable.         Condition: stable

## 2015-09-17 NOTE — Transfer of Care (Signed)
Immediate Anesthesia Transfer of Care Note  Patient: Dawn [name]   Procedure(s) Performed: Procedure(s): RADIOACTIVE SEED LOCALIZATION LEFT BREAST LUMPECTOMY AND LEFT AXILLARY SENTINEL LYMPH NODE BIOPSY (Left)  Patient Location: PACU  Anesthesia Type:General and Regional  Level of Consciousness: awake and alert   Airway & Oxygen Therapy: Patient Spontanous Breathing and Patient connected to nasal cannula oxygen  Post-op Assessment: Report given to RN, Post -op Vital signs reviewed and stable and Patient moving all extremities  Post vital signs: Reviewed and stable  Last Vitals:  Filed Vitals:   09/17/15 1045 09/17/15 1228  BP: 161/68 162/63  Pulse: 68 77  Temp:  36.3 C  Resp: 10 15    Complications: No apparent anesthesia complications

## 2015-09-18 ENCOUNTER — Encounter (HOSPITAL_COMMUNITY): Payer: Self-pay | Admitting: General Surgery

## 2015-09-18 NOTE — Anesthesia Postprocedure Evaluation (Addendum)
Anesthesia Post Note  Patient: Dawn [name]  Procedure(s) Performed: Procedure(s) (LRB): RADIOACTIVE SEED LOCALIZATION LEFT BREAST LUMPECTOMY AND LEFT AXILLARY SENTINEL LYMPH NODE BIOPSY (Left)  Patient location during evaluation: PACU Anesthesia Type: General and Regional Level of consciousness: awake Pain management: pain level controlled Vital Signs Assessment: post-procedure vital signs reviewed and stable Respiratory status: spontaneous breathing Cardiovascular status: stable Postop Assessment: no signs of nausea or vomiting Anesthetic complications: no    Last Vitals:  Filed Vitals:   09/17/15 1358 09/17/15 1403  BP:  165/67  Pulse:    Temp: 36.2 C 36.2 C  Resp:      Last Pain:  Filed Vitals:   09/17/15 1405  PainSc: 0-No pain                 Atisha Hamidi

## 2015-09-18 NOTE — Addendum Note (Signed)
Addendum  created 09/18/15 1813 by Oleta Mouse, MD   Modules edited: Anesthesia Events, Clinical Notes, Narrator   Clinical Notes:  File: [id number]   Narrator:  Narrator: Event Log Edited

## 2015-09-23 ENCOUNTER — Telehealth: Payer: Self-pay | Admitting: Hematology and Oncology

## 2015-09-23 NOTE — Telephone Encounter (Signed)
Patient called in to reschedule her appointment due to weather

## 2015-09-24 ENCOUNTER — Ambulatory Visit: Payer: Medicare Other | Admitting: Hematology and Oncology

## 2015-09-29 NOTE — Assessment & Plan Note (Signed)
Left breast lumpectomy 09/17/2015: Invasive ductal carcinoma with calcifications, grade 1, 2.5 cm, low-grade DCIS, LVI present, 0/1 lymph nodes negative, T2 N0 stage II a, ER 100%, PR 20%, HER-2 negative, Ki-67 20%  Pathology counseling: I discussed the final pathology report of the patient provided  a copy of this report. I discussed the margins as well as lymph node surgeries. We also discussed the final staging along with previously performed ER/PR and HER-2/neu testing.  Recommendations: 1. Oncotype DX testing to determine if chemotherapy would be of any benefit followed by 2. Adjuvant radiation therapy followed by 3. Adjuvant antiestrogen therapy  Return to clinic based upon Oncotype DX test result

## 2015-09-30 ENCOUNTER — Telehealth: Payer: Self-pay | Admitting: Hematology and Oncology

## 2015-09-30 ENCOUNTER — Ambulatory Visit (HOSPITAL_BASED_OUTPATIENT_CLINIC_OR_DEPARTMENT_OTHER): Payer: Medicare Other | Admitting: Hematology and Oncology

## 2015-09-30 ENCOUNTER — Encounter: Payer: Self-pay | Admitting: Hematology and Oncology

## 2015-09-30 VITALS — BP 139/81 | HR 81 | Temp 97.5°F | Resp 18 | Wt 141.1 lb

## 2015-09-30 DIAGNOSIS — Z720 Tobacco use: Secondary | ICD-10-CM | POA: Diagnosis not present

## 2015-09-30 DIAGNOSIS — Z170 Estrogen receptor positive status [ER+]: Secondary | ICD-10-CM | POA: Diagnosis not present

## 2015-09-30 DIAGNOSIS — C50412 Malignant neoplasm of upper-outer quadrant of left female breast: Secondary | ICD-10-CM | POA: Diagnosis not present

## 2015-09-30 NOTE — Progress Notes (Signed)
Patient Care Team: Lona Kettle, MD as PCP - General (Family Medicine) Excell Seltzer, MD as Consulting Physician (General Surgery) Nicholas Lose, MD as Consulting Physician (Hematology and Oncology) Arloa Koh, MD as Consulting Physician (Radiation Oncology) Sylvan Cheese, NP as Nurse Practitioner (Hematology and Oncology)  DIAGNOSIS: Breast cancer of upper-outer quadrant of left female breast [facility])   Staging form: Breast, AJCC 7th Edition     Clinical stage from 08/20/2015: Stage IIA (T2, N0, M0) - Unsigned   SUMMARY OF ONCOLOGIC HISTORY:   Breast cancer of upper-outer quadrant of left female breast ([place] [name])   08/14/2015 Initial Diagnosis left breast biopsy: Invasive ductal carcinoma with DCIS grade 1-2, ER 100%, PR 20%, Ki-67 20%, HER-2 negative ratio 1.77   08/14/2015 Mammogram left breast focal asymmetry, ultrasound revealed 2.5 cm lesion. Because of breast pain, patient refuses diagnostic mammograms.   09/17/2015 Surgery left breast lumpectomy: Invasive ductal carcinoma with calcifications, grade 1, 2.5 cm, low-grade DCIS, LVI present, 0/1 lymph nodes negative, T2 N0 stage II a, ER 100%, PR 20%, HER-2 negative, Ki-67 20%    CHIEF COMPLIANT: Follow-up after surgery with lumpectomy  INTERVAL HISTORY: Dawn [name] is a 75 year old with above-mentioned history of left breast cancer who underwent left lumpectomy and is here today to discuss the pathology report. She complains of swelling and discomfort in the left breast. She had seen Dr. Excell Seltzer for follow-up. She reports that her breast discomfort is improving slowly over time.  REVIEW OF SYSTEMS:   Constitutional: Denies fevers, chills or abnormal weight loss Eyes: Denies blurriness of vision Ears, nose, mouth, throat, and face: Denies mucositis or sore throat Respiratory: Denies cough, dyspnea or wheezes Cardiovascular: Denies palpitation, chest discomfort Gastrointestinal:  Denies nausea, heartburn or change in bowel  habits Skin: Denies abnormal skin rashes Lymphatics: Denies new lymphadenopathy or easy bruising Neurological:Denies numbness, tingling or new weaknesses Behavioral/Psych: Mood is stable, no new changes  Extremities: No lower extremity edema Breast: Breast discomfort from recent surgery All other systems were reviewed with the patient and are negative.  I have reviewed the past medical history, past surgical history, social history and family history with the patient and they are unchanged from previous note.  ALLERGIES:  is allergic to pneumococcal vaccines and prandin.  MEDICATIONS:  Current Outpatient Prescriptions  Medication Sig Dispense Refill  . acarbose (PRECOSE) 25 MG tablet Take 25 mg by mouth 3 (three) times daily with meals.     Marland Kitchen aspirin EC 81 MG tablet Take 81 mg by mouth daily.    Marland Kitchen buPROPion (WELLBUTRIN XL) 300 MG 24 hr tablet Take 300 mg by mouth daily.    . Cholecalciferol (VITAMIN D3) 5000 UNITS TABS Take 5,000 Units by mouth daily.    Marland Kitchen glipiZIDE (GLUCOTROL XL) 2.5 MG 24 hr tablet Take 2.5 mg by mouth daily with breakfast.    . HYDROcodone-acetaminophen (NORCO/VICODIN) 5-325 MG tablet Take 1-2 tablets by mouth every 4 (four) hours as needed for moderate pain or severe pain. 30 tablet 0  . metFORMIN (GLUCOPHAGE-XR) 500 MG 24 hr tablet Take 1,000 mg by mouth 2 (two) times daily.    . pravastatin (PRAVACHOL) 40 MG tablet Take 40 mg by mouth daily.    . quinapril (ACCUPRIL) 40 MG tablet Take 40 mg by mouth daily.     . verapamil (CALAN-SR) 240 MG CR tablet Take 240 mg by mouth daily.     No current facility-administered medications for this visit.    PHYSICAL EXAMINATION: ECOG PERFORMANCE STATUS: 1 -  Symptomatic but completely ambulatory  Filed Vitals:   09/30/15 1114  BP: 139/81  Pulse: 81  Temp: 97.5 F (36.4 C)  Resp: 18   Filed Weights   09/30/15 1114  Weight: 141 lb 1.6 oz (64.003 kg)    GENERAL:alert, no distress and comfortable SKIN: skin color,  texture, turgor are normal, no rashes or significant lesions EYES: normal, Conjunctiva are pink and non-injected, sclera clear OROPHARYNX:no exudate, no erythema and lips, buccal mucosa, and tongue normal  NECK: supple, thyroid normal size, non-tender, without nodularity LYMPH:  no palpable lymphadenopathy in the cervical, axillary or inguinal LUNGS: clear to auscultation and percussion with normal breathing effort HEART: regular rate & rhythm and no murmurs and no lower extremity edema ABDOMEN:abdomen soft, non-tender and normal bowel sounds MUSCULOSKELETAL:no cyanosis of digits and no clubbing  NEURO: alert & oriented x 3 with fluent speech, no focal motor/sensory deficits EXTREMITIES: No lower extremity edema  LABORATORY DATA:  I have reviewed the data as listed   Chemistry      Component Value Date/Time   NA 135 09/09/2015 0904   NA 137 08/20/2015 1232   K 4.6 09/09/2015 0904   K 4.3 08/20/2015 1232   CL 99* 09/09/2015 0904   CO2 25 09/09/2015 0904   CO2 25 08/20/2015 1232   BUN 6 09/09/2015 0904   BUN 10.4 08/20/2015 1232   CREATININE 0.81 09/09/2015 0904   CREATININE 1.1 08/20/2015 1232      Component Value Date/Time   CALCIUM 9.4 09/09/2015 0904   CALCIUM 10.0 08/20/2015 1232   ALKPHOS 142 08/20/2015 1232   AST 18 08/20/2015 1232   ALT 11 08/20/2015 1232   BILITOT 0.32 08/20/2015 1232       Lab Results  Component Value Date   WBC 6.4 09/09/2015   HGB 14.4 09/09/2015   HCT 43.9 09/09/2015   MCV 89.0 09/09/2015   PLT 317 09/09/2015   NEUTROABS 3.3 08/20/2015     ASSESSMENT & PLAN:  Breast cancer of upper-outer quadrant of left female breast ([place]) Left breast lumpectomy 09/17/2015: Invasive ductal carcinoma with calcifications, grade 1, 2.5 cm, low-grade DCIS, LVI present, 0/1 lymph nodes negative, T2 N0 stage II a, ER 100%, PR 20%, HER-2 negative, Ki-67 20%  Pathology counseling: I discussed the final pathology report of the patient provided  a copy of this  report. I discussed the margins as well as lymph node surgeries. We also discussed the final staging along with previously performed ER/PR and HER-2/neu testing.  Recommendations: 1. Oncotype DX testing to determine if chemotherapy would be of any benefit followed by 2. Adjuvant radiation therapy followed by 3. Adjuvant antiestrogen therapy  Tobacco abuse: She does not prepared to quit smoking. She was offered counseling. She was not interested.  Patient is not very keen on receiving chemotherapy but would like to make her decision after we have the result of Oncotype DX testing.  Return to clinic in 2 weeks to discuss the results of Oncotype DX.  No orders of the defined types were placed in this encounter.   The patient has a good understanding of the overall plan. she agrees with it. she will call with any problems that may develop before the next visit here.   Rulon Eisenmenger, MD 09/30/2015

## 2015-09-30 NOTE — Telephone Encounter (Signed)
Appointments made and avs printed for patient °

## 2015-09-30 NOTE — Telephone Encounter (Signed)
Appointments made and avs printed for pateint °

## 2015-10-01 ENCOUNTER — Other Ambulatory Visit: Payer: Self-pay | Admitting: *Deleted

## 2015-10-01 ENCOUNTER — Encounter: Payer: Self-pay | Admitting: *Deleted

## 2015-10-01 NOTE — Progress Notes (Signed)
Ordered oncotype per Dr. Gudena. Faxed requisition to pathology. °

## 2015-10-02 ENCOUNTER — Telehealth: Payer: Self-pay | Admitting: Hematology and Oncology

## 2015-10-02 NOTE — Telephone Encounter (Signed)
Called patient and she is aware of her new appointment time on 2/3

## 2015-10-09 ENCOUNTER — Encounter (HOSPITAL_COMMUNITY): Payer: Self-pay

## 2015-10-10 ENCOUNTER — Telehealth: Payer: Self-pay | Admitting: *Deleted

## 2015-10-10 NOTE — Telephone Encounter (Signed)
Left vm for pt to return call to give oncotype score of 9 Referral placed for pt to see Dr. Isidore Moos for xrt.

## 2015-10-14 ENCOUNTER — Telehealth: Payer: Self-pay | Admitting: *Deleted

## 2015-10-14 ENCOUNTER — Ambulatory Visit: Payer: Medicare Other | Admitting: Hematology and Oncology

## 2015-10-14 NOTE — Telephone Encounter (Signed)
Called pt to relate she does not need to come in to see Dr. Lindi Adie on 2/3. Confirmed her appt with Dr. Isidore Moos on 2/1. Pt c/o pinkness, swelling and pain to the incision site. Relate she received some medication from Dr. Excell Seltzer, but it hasn't helped. Reached out to [place], Dr. Lear Ng nurse about pt complaints.

## 2015-10-14 NOTE — Progress Notes (Signed)
Location of Breast Cancer: Left Breast  Histology per Pathology Report:  09/17/15 Diagnosis 1. Breast, lumpectomy, left - INVASIVE DUCTAL CARCINOMA WITH CALCIFICATIONS, GRADE I/III, SPANNING 2.5 CM. - DUCTAL CARCINOMA IN SITU, LOW GRADE. - LYMPHOVASCULAR INVASION IS IDENTIFIED. - INVASIVE CARCINOMA IS BROADLY LESS THAN 0.1 CM TO THE ANTERIOR MARGIN. - SEE ONCOLOGY TABLE BELOW. 2. Lymph node, sentinel, biopsy, Left axillary - THERE IS NO EVIDENCE OF CARCINOMA IN 1 OF 1 LYMPH NODE (0/1).  Receptor Status: ER(100%), PR (20%), Her2-neu (NEG)  Did patient present with symptoms or was this found on screening mammography?: Per Dr. Lear Ng note 08/20/15: She recently presented for a screening mamogram which apparently was initially not done due to breast pain.. Subsequent imaging included bilateral breast ultrasound showing a 2.5 cm hypoechoic suspicious left breast mass in the upper outer quadrant middle depth. following this bilateral mammogram was able to be performed with limited compression showing an asymmetric density and calcifications in the site of the abnormality on ultrasound.   Past/Anticipated interventions by surgeon, if any: Dr. Excell Seltzer performed on 09/17/15: Procedure: Procedure(s): BLUE DYE INJECTION LEFT BREAST, RADIOACTIVE SEED LOCALIZATION LEFT BREAST LUMPECTOMY AND LEFT AXILLARY SENTINEL LYMPH NODE BIOPSY  Past/Anticipated interventions by medical oncology, if any: She saw Dr. Lindi Adie on 09/30/15 and he recommends: Recommendations: 1. Oncotype DX testing to determine if chemotherapy would be of any benefit followed by 2. Adjuvant radiation therapy followed by 3. Adjuvant antiestrogen therapy  10/10/15 Oncotype score of 9, and she will not need chemotherapy per Dr. Lindi Adie.   Lymphedema issues, if any:  No  Pain issues, if any:  She reports stabbing pains (which she describes as like a bee sting) over her entire Left Breast, that seemed to get worse this morning. She does  state it is gone at this time. She reports a "hard area" over her incision site which also bothers her at times. Her Left Breast also has some edema.   SAFETY ISSUES:  Prior radiation? No  Pacemaker/ICD? No  Possible current pregnancy? No  Is the patient on methotrexate? No  Current Complaints / other details:  None  BP 156/63 mmHg  Pulse 71  Temp(Src) 98.3 F (36.8 C)  Ht '5\' 4"'  (1.626 m)  Wt 142 lb 6.4 oz (64.592 kg)  BMI 24.43 kg/m2   Wt Readings from Last 3 Encounters:  10/15/15 142 lb 6.4 oz (64.592 kg)  09/30/15 141 lb 1.6 oz (64.003 kg)  09/17/15 141 lb (63.957 kg)      Leeya Rusconi, Stephani Police, RN 10/14/2015,9:59 AM

## 2015-10-15 ENCOUNTER — Ambulatory Visit
Admission: RE | Admit: 2015-10-15 | Discharge: 2015-10-15 | Disposition: A | Discharge status: Home / Self Care | Payer: Medicare Other | Source: Ambulatory Visit | Attending: Radiation Oncology | Admitting: Radiation Oncology

## 2015-10-15 ENCOUNTER — Encounter: Payer: Self-pay | Admitting: Radiation Oncology

## 2015-10-15 VITALS — BP 156/63 | HR 71 | Temp 98.3°F | Ht 64.0 in | Wt 142.4 lb

## 2015-10-15 DIAGNOSIS — Z510 Encounter for antineoplastic radiation therapy: Secondary | ICD-10-CM | POA: Insufficient documentation

## 2015-10-15 DIAGNOSIS — C50412 Malignant neoplasm of upper-outer quadrant of left female breast: Secondary | ICD-10-CM

## 2015-10-15 DIAGNOSIS — Z7982 Long term (current) use of aspirin: Secondary | ICD-10-CM | POA: Insufficient documentation

## 2015-10-15 DIAGNOSIS — Z170 Estrogen receptor positive status [ER+]: Secondary | ICD-10-CM | POA: Diagnosis not present

## 2015-10-15 NOTE — Progress Notes (Signed)
Radiation Oncology         (336) [phone] ________________________________  Name: MIYANNA WIERSMA MRN: [id number]  Date: 10/15/2015  DOB: [birth year]  Follow-Up Visit Note  Outpatient  CC:  Melinda Crutch, MD  Lona Kettle, MD  Diagnosis:  Stage II  [id number] left breast invasive ductal carcinoma, Grade I, with DCIS, ER/PR +/ HER2 neg   ICD-9-CM ICD-10-CM   1. Breast cancer of upper-outer quadrant of left female breast ([place]) 174.4 C50.412      Narrative:  The patient returns today for follow-up.     Since consultation, she underwent lumpectomy and SLN biopsy.  The left breast tumor was 2.5cm with negative but close margins. The single node was negative.  See diagnosis above for tumor characteristics.   Dr. Excell Seltzer performed her surgery on 09/17/15.   She saw Dr. Lindi Adie on 09/30/15 and she had an Oncotype score of 9, and she will not need chemotherapy per Dr. Lindi Adie.  Lymphedema issues, if any:  No  Pain issues, if any:  She reports stabbing pains (which she describes as like a bee sting) over her entire Left Breast, that seemed to get worse this morning. She does state it is gone at this time. She reports a "hard area" over her incision site which also bothers her at times. Her Left Breast also has some edema.   SAFETY ISSUES:  Prior radiation? No  Pacemaker/ICD? No  Possible current pregnancy? No  Is the patient on methotrexate? No            ALLERGIES:  is allergic to pneumococcal vaccines and prandin.  Meds: Current Outpatient Prescriptions  Medication Sig Dispense Refill  . acarbose (PRECOSE) 25 MG tablet Take 25 mg by mouth 3 (three) times daily with meals.     Marland Kitchen aspirin EC 81 MG tablet Take 81 mg by mouth daily.    Marland Kitchen buPROPion (WELLBUTRIN XL) 300 MG 24 hr tablet Take 300 mg by mouth daily.    . Cholecalciferol (VITAMIN D3) 5000 UNITS TABS Take 5,000 Units by mouth daily.    Marland Kitchen glipiZIDE (GLUCOTROL XL) 2.5 MG 24 hr tablet Take 2.5 mg by mouth daily with breakfast.    . metFORMIN  (GLUCOPHAGE-XR) 500 MG 24 hr tablet Take 1,000 mg by mouth 2 (two) times daily.    . pravastatin (PRAVACHOL) 40 MG tablet Take 40 mg by mouth daily.    . quinapril (ACCUPRIL) 40 MG tablet Take 40 mg by mouth daily.     . verapamil (CALAN-SR) 240 MG CR tablet Take 240 mg by mouth daily.    Marland Kitchen HYDROcodone-acetaminophen (NORCO/VICODIN) 5-325 MG tablet Take 1-2 tablets by mouth every 4 (four) hours as needed for moderate pain or severe pain. (Patient not taking: Reported on 10/15/2015) 30 tablet 0   No current facility-administered medications for this encounter.    Physical Findings:  height is _0  (1.626 m) and weight is 142 lb 6.4 oz (64.592 kg). Her temperature is 98.3 F (36.8 C). Her blood pressure is 156/63 and her pulse is 71. .     General: Alert and oriented, in no acute distress  Neck: Neck is supple, no palpable cervical or supraclavicular lymphadenopathy. Heart: Regular in rate and rhythm with no murmurs, rubs, or gallops. Chest: Clear to auscultation bilaterally, with no rhonchi, wheezes, or rales.  Extremities: No cyanosis or edema. Lymphatics: see Neck Exam  Psychiatric: Judgment and insight are intact. Affect is appropriate. Breast exam reveals mild erythema of left breast with significant  seroma under lumpectomy scar.  Scars at lumpectomy and axillary sites have healed.  Lab Findings: Lab Results  Component Value Date   WBC 6.4 09/09/2015   HGB 14.4 09/09/2015   HCT 43.9 09/09/2015   MCV 89.0 09/09/2015   PLT 317 09/09/2015      Radiographic Findings: Nm Sentinel Node Inj-no Rpt (breast)  09/17/2015  CLINICAL DATA: cancer left breast Sulfur colloid was injected intradermally by the nuclear medicine technologist for breast cancer sentinel node localization.    Impression/Plan: We discussed adjuvant radiotherapy today.  I recommend radiotherapy to the left breast in order to reduce risk of recurrence in the breast by 2/3.  The risks, benefits and side effects of this  treatment were discussed in detail.  She understands that radiotherapy is associated with skin irritation and fatigue in the acute setting. Late effects can include cosmetic changes and rare injury to internal organs.   She is enthusiastic about proceeding with treatment. A consent form has been signed and placed in her chart. Simulation scheduled for 2/27.  I encouraged patient to see Dr Excell Seltzer before then to follow her seroma.  I spent 25 minutes face to face with patient, over half of that time on counseling and coordination of care. _____________________________________   Eppie Gibson, MD

## 2015-10-15 NOTE — Addendum Note (Signed)
Encounter addended by: Ernst Spell, RN on: 10/15/2015  2:38 PM<BR>     Documentation filed: Charges VN

## 2015-10-17 ENCOUNTER — Ambulatory Visit: Payer: Medicare Other | Admitting: Hematology and Oncology

## 2015-10-20 ENCOUNTER — Telehealth: Payer: Self-pay

## 2015-10-20 NOTE — Telephone Encounter (Signed)
I called and spoke with Dawn [name] this morning regarding her missed appointment with Dawn [name]. I told her Dawn [name] would really like for her to see Dawn [name] before radiation begins for assessment. Dawn [name] reports that she is waiting for Dawn [name] office to call her back with a new appointment. I asked her to call me when she has that appointment scheduled.

## 2015-11-07 ENCOUNTER — Telehealth: Payer: Self-pay | Admitting: *Deleted

## 2015-11-07 NOTE — Telephone Encounter (Signed)
Return pt call. Left vm with contact information to return call.

## 2015-11-10 ENCOUNTER — Ambulatory Visit
Admission: RE | Admit: 2015-11-10 | Discharge: 2015-11-10 | Disposition: A | Discharge status: Home / Self Care | Payer: Medicare Other | Source: Ambulatory Visit | Attending: Radiation Oncology | Admitting: Radiation Oncology

## 2015-11-10 DIAGNOSIS — C50412 Malignant neoplasm of upper-outer quadrant of left female breast: Secondary | ICD-10-CM

## 2015-11-10 DIAGNOSIS — Z510 Encounter for antineoplastic radiation therapy: Secondary | ICD-10-CM | POA: Diagnosis not present

## 2015-11-10 NOTE — Progress Notes (Signed)
  Radiation Oncology         (336) [phone] ________________________________  Name: Dawn [name] MRN: [id number]  Date: 11/10/2015  DOB: [birth year]  SIMULATION AND TREATMENT PLANNING NOTE    Outpatient  DIAGNOSIS:     ICD-9-CM ICD-10-CM   1. Breast cancer of upper-outer quadrant of left female breast ([place]) 174.4 C50.412     NARRATIVE:  The patient was brought to the [facility].  Identity was confirmed.  All relevant records and images related to the planned course of therapy were reviewed.  The patient freely provided informed written consent to proceed with treatment after reviewing the details related to the planned course of therapy. The consent form was witnessed and verified by the simulation staff.    Then, the patient was set-up in a stable reproducible supine position for radiation therapy with her ipsilateral arm over her head, and her upper body secured in a custom-made Vac-lok device.  CT images were obtained.  Surface markings were placed.  The CT images were loaded into the planning software.    TREATMENT PLANNING NOTE: Treatment planning then occurred.  The radiation prescription was entered and confirmed.     A total of 3 medically necessary complex treatment devices were fabricated and supervised by me: 2 fields with MLCs for custom blocks to protect heart, and lungs;  and, a Vac-lok. MORE COMPLEX DEVICES MAY BE MADE IN DOSIMETRY FOR FIELD IN FIELD BEAMS FOR DOSE HOMOGENEITY.  I have requested : 3D Simulation  I have requested a DVH of the following structures: lungs, heart, lumpectomy cavity.    The patient will receive 40.05 Gy in 15 fractions to the left breast with 2 tangential fields.   This will  be followed by a boost.  Optical Surface Tracking Plan:  Since intensity modulated radiotherapy (IMRT) and 3D conformal radiation treatment methods are predicated on accurate and precise positioning for treatment, intrafraction motion monitoring is medically  necessary to ensure accurate and safe treatment delivery. The ability to quantify intrafraction motion without excessive ionizing radiation dose can only be performed with optical surface tracking. Accordingly, surface imaging offers the opportunity to obtain 3D measurements of patient position throughout IMRT and 3D treatments without excessive radiation exposure. I am ordering optical surface tracking for this patient's upcoming course of radiotherapy.  ________________________________   Reference:  Ursula Alert, J, et al. Surface imaging-based analysis of intrafraction motion for breast radiotherapy patients.Journal of [place], n. 6, nov. 2014. ISSN [id number].  Available at: <http://www.jacmp.org/index.php/jacmp/article/view/4957>.    -----------------------------------  Eppie Gibson, MD

## 2015-11-11 ENCOUNTER — Other Ambulatory Visit: Payer: Self-pay | Admitting: *Deleted

## 2015-11-11 ENCOUNTER — Telehealth: Payer: Self-pay | Admitting: Hematology and Oncology

## 2015-11-11 NOTE — Telephone Encounter (Signed)
Spoke with patient and gave appt per pof 04/07 @ 8:15 w/Dr. Lindi Adie  Calendar mailed.

## 2015-11-12 DIAGNOSIS — Z510 Encounter for antineoplastic radiation therapy: Secondary | ICD-10-CM | POA: Diagnosis not present

## 2015-11-14 DIAGNOSIS — Z510 Encounter for antineoplastic radiation therapy: Secondary | ICD-10-CM | POA: Diagnosis not present

## 2015-11-17 ENCOUNTER — Ambulatory Visit
Admission: RE | Admit: 2015-11-17 | Discharge: 2015-11-17 | Disposition: A | Discharge status: Home / Self Care | Payer: Medicare Other | Source: Ambulatory Visit | Attending: Radiation Oncology | Admitting: Radiation Oncology

## 2015-11-17 DIAGNOSIS — Z510 Encounter for antineoplastic radiation therapy: Secondary | ICD-10-CM | POA: Diagnosis not present

## 2015-11-18 ENCOUNTER — Ambulatory Visit
Admission: RE | Admit: 2015-11-18 | Discharge: 2015-11-18 | Disposition: A | Discharge status: Home / Self Care | Payer: Medicare Other | Source: Ambulatory Visit | Attending: Radiation Oncology | Admitting: Radiation Oncology

## 2015-11-18 DIAGNOSIS — Z510 Encounter for antineoplastic radiation therapy: Secondary | ICD-10-CM | POA: Diagnosis not present

## 2015-11-19 ENCOUNTER — Ambulatory Visit: Admission: RE | Admit: 2015-11-19 | Payer: Medicare Other | Source: Ambulatory Visit

## 2015-11-19 ENCOUNTER — Ambulatory Visit
Admission: RE | Admit: 2015-11-19 | Discharge: 2015-11-19 | Disposition: A | Discharge status: Home / Self Care | Payer: Medicare Other | Source: Ambulatory Visit | Attending: Radiation Oncology | Admitting: Radiation Oncology

## 2015-11-20 ENCOUNTER — Ambulatory Visit
Admission: RE | Admit: 2015-11-20 | Discharge: 2015-11-20 | Disposition: A | Discharge status: Home / Self Care | Payer: Medicare Other | Source: Ambulatory Visit | Attending: Radiation Oncology | Admitting: Radiation Oncology

## 2015-11-20 DIAGNOSIS — C50412 Malignant neoplasm of upper-outer quadrant of left female breast: Secondary | ICD-10-CM

## 2015-11-20 DIAGNOSIS — Z510 Encounter for antineoplastic radiation therapy: Secondary | ICD-10-CM | POA: Diagnosis not present

## 2015-11-20 MED ORDER — ALRA NON-METALLIC DEODORANT (RAD-ONC)
1.0000 "application " | Freq: Once | TOPICAL | Status: AC
  Administered 2015-11-20: 1 via TOPICAL

## 2015-11-20 MED ORDER — RADIAPLEXRX EX GEL
Freq: Once | CUTANEOUS | Status: AC
  Administered 2015-11-20: 09:00:00 via TOPICAL

## 2015-11-20 NOTE — Progress Notes (Signed)
Pt here for patient teaching.  Pt given Radiation and You booklet, skin care instructions, Alra deodorant and Radiaplex gel. Pt reports they have not watched the Radiation Therapy Education video but were given the link to watch at home .  Reviewed areas of pertinence such as fatigue, skin changes, breast tenderness, breast swelling, cough, shortness of breath, earaches and taste changes . Pt able to give teach back of to pat skin, use unscented/gentle soap and drink plenty of water,apply Radiaplex bid, avoid applying anything to skin within 4 hours of treatment, avoid wearing an under wire bra and to use an electric razor if they must shave. Pt verbalizes understanding of information given and will contact nursing with any questions or concerns.     Http://rtanswers.org/treatmentinformation/whattoexpect/index

## 2015-11-21 ENCOUNTER — Ambulatory Visit
Admission: RE | Admit: 2015-11-21 | Discharge: 2015-11-21 | Disposition: A | Discharge status: Home / Self Care | Payer: Medicare Other | Source: Ambulatory Visit | Attending: Radiation Oncology | Admitting: Radiation Oncology

## 2015-11-21 DIAGNOSIS — Z510 Encounter for antineoplastic radiation therapy: Secondary | ICD-10-CM | POA: Diagnosis not present

## 2015-11-24 ENCOUNTER — Ambulatory Visit
Admission: RE | Admit: 2015-11-24 | Discharge: 2015-11-24 | Disposition: A | Discharge status: Home / Self Care | Payer: Medicare Other | Source: Ambulatory Visit | Attending: Radiation Oncology | Admitting: Radiation Oncology

## 2015-11-24 ENCOUNTER — Encounter: Payer: Self-pay | Admitting: Radiation Oncology

## 2015-11-24 VITALS — BP 171/70 | HR 73 | Temp 97.7°F | Wt 139.7 lb

## 2015-11-24 DIAGNOSIS — Z510 Encounter for antineoplastic radiation therapy: Secondary | ICD-10-CM | POA: Diagnosis not present

## 2015-11-24 DIAGNOSIS — C50412 Malignant neoplasm of upper-outer quadrant of left female breast: Secondary | ICD-10-CM

## 2015-11-24 NOTE — Progress Notes (Signed)
   Weekly Management Note:  Outpatient    ICD-9-CM ICD-10-CM   1. Breast cancer of upper-outer quadrant of left female breast ([facility]) 174.4 C50.412     Current Dose:  10.68 Gy  Projected Dose: 50.05 Gy   Narrative:  The patient presents for routine under treatment assessment.  CBCT/MVCT images/Port film x-rays were reviewed.  The chart was checked. She notes stable mild warmth and erythema over her left breast with tenderness around the seroma.  Has an appt on 3-31 w/ Dr Excell Seltzer and expects to have it drained again.  Denies fever or chills.  Physical Findings:  weight is 139 lb 11.2 oz (63.368 kg). Her temperature is 97.7 F (36.5 C). Her blood pressure is 171/70 and her pulse is 73. Her oxygen saturation is 97%.   Wt Readings from Last 3 Encounters:  11/24/15 139 lb 11.2 oz (63.368 kg)  10/15/15 142 lb 6.4 oz (64.592 kg)  09/30/15 141 lb 1.6 oz (64.003 kg)   NAD, mild warmth / erythema over left breast   Impression:  The patient is tolerating radiotherapy.  Plan:  Continue radiotherapy as planned.  I will ask Harlow Asa, RN to call Dr. Lear Ng clinic to see if he would like to see patient earlier than 3-31.  I am not sure if he would recommend trying an antibiotic or not due to the patient's symptoms above.  She notes stable mild warmth and erythema over her left breast with tenderness around the seroma. She states this preceded the start of RT but is not associated with fevers or chills.  ________________________________   Eppie Gibson, M.D.

## 2015-11-24 NOTE — Progress Notes (Signed)
Dawn [name] is here for her 4th fraction of radiation to her Left Breast. She reports soreness and swelling at her incision site. Her Left breast is red and tender to touch. She is using the radiaplex cream as directed. She has a firmness at her incision site. She reports she will see Dr. Excell Seltzer on 3/31 and expects to have the area drained. She occasionally has shooting pains in her Left Breast, but states it has become decreased in frequency. She reports she feels "good". She does have some fatigue, and naps in the afternoon at times.  BP 171/70 mmHg  Pulse 73  Temp(Src) 97.7 F (36.5 C)  Wt 139 lb 11.2 oz (63.368 kg)  SpO2 97%   Wt Readings from Last 3 Encounters:  11/24/15 139 lb 11.2 oz (63.368 kg)  10/15/15 142 lb 6.4 oz (64.592 kg)  09/30/15 141 lb 1.6 oz (64.003 kg)

## 2015-11-25 ENCOUNTER — Telehealth: Payer: Self-pay

## 2015-11-25 ENCOUNTER — Ambulatory Visit
Admission: RE | Admit: 2015-11-25 | Discharge: 2015-11-25 | Disposition: A | Discharge status: Home / Self Care | Payer: Medicare Other | Source: Ambulatory Visit | Attending: Radiation Oncology | Admitting: Radiation Oncology

## 2015-11-25 DIAGNOSIS — Z510 Encounter for antineoplastic radiation therapy: Secondary | ICD-10-CM | POA: Diagnosis not present

## 2015-11-25 NOTE — Telephone Encounter (Signed)
I called and left a voice mail for Ms. Dawn [name] regarding the scheduling of an earlier appointment with Dr. Excell Seltzer to have her breast examined. The appointment is scheduled for this Friday at 10:00 (11/28/15). I have asked her to call me back when she receives this message.

## 2015-11-26 ENCOUNTER — Ambulatory Visit
Admission: RE | Admit: 2015-11-26 | Discharge: 2015-11-26 | Disposition: A | Discharge status: Home / Self Care | Payer: Medicare Other | Source: Ambulatory Visit | Attending: Radiation Oncology | Admitting: Radiation Oncology

## 2015-11-26 ENCOUNTER — Telehealth: Payer: Self-pay | Admitting: *Deleted

## 2015-11-26 ENCOUNTER — Encounter: Payer: Self-pay | Admitting: Radiation Oncology

## 2015-11-26 DIAGNOSIS — Z510 Encounter for antineoplastic radiation therapy: Secondary | ICD-10-CM | POA: Diagnosis not present

## 2015-11-26 NOTE — Telephone Encounter (Signed)
Left message for a return phone call to follow up after start of radiation.   

## 2015-11-27 ENCOUNTER — Ambulatory Visit
Admission: RE | Admit: 2015-11-27 | Discharge: 2015-11-27 | Disposition: A | Discharge status: Home / Self Care | Payer: Medicare Other | Source: Ambulatory Visit | Attending: Radiation Oncology | Admitting: Radiation Oncology

## 2015-11-27 DIAGNOSIS — Z510 Encounter for antineoplastic radiation therapy: Secondary | ICD-10-CM | POA: Diagnosis not present

## 2015-11-28 ENCOUNTER — Telehealth: Payer: Self-pay

## 2015-11-28 ENCOUNTER — Ambulatory Visit
Admission: RE | Admit: 2015-11-28 | Discharge: 2015-11-28 | Disposition: A | Discharge status: Home / Self Care | Payer: Medicare Other | Source: Ambulatory Visit | Attending: Radiation Oncology | Admitting: Radiation Oncology

## 2015-11-28 DIAGNOSIS — Z510 Encounter for antineoplastic radiation therapy: Secondary | ICD-10-CM | POA: Diagnosis not present

## 2015-11-28 NOTE — Telephone Encounter (Signed)
Dawn [name] called me today to inform me that when she went to her appointment (that I scheduled for her) at 10:00 this morning at Dawn [name] that the office they told her that she did not have an appointment scheduled at that time. They offered her a different appointment for 5:00 today, but she declined due to distance she needs to travel. She was very upset that they did not have her appointment listed. I called Dawn [name] office to inquire about the appointment. They stated the appointment was never scheduled because she did not call (when left a message) to confirm the new appointment. I was not informed that she needed to call, I had thought the appointment was scheduled. I have scheduled her an appointment at their urgent care clinic on Monday 12/01/15 at 1:45 with Dawn [name]. She will see Dawn [name] after her radiation appointment on Monday morning for her under treat visit. They will decide then how she is doing and if that urgent care appointment is necessary. Dawn [name] did inform me that her breast edema has decreased, the seroma is smaller, and the shooting pain that she had has disappeared. She stated her breast is pinkish in color. I advised her to call [facility] if she developed any acute issues over the weekend. I gave her the number and she voiced her understanding.

## 2015-12-01 ENCOUNTER — Ambulatory Visit
Admission: RE | Admit: 2015-12-01 | Discharge: 2015-12-01 | Disposition: A | Discharge status: Home / Self Care | Payer: Medicare Other | Source: Ambulatory Visit | Attending: Radiation Oncology | Admitting: Radiation Oncology

## 2015-12-01 ENCOUNTER — Ambulatory Visit: Admission: RE | Admit: 2015-12-01 | Payer: Medicare Other | Source: Ambulatory Visit | Admitting: Radiation Oncology

## 2015-12-01 ENCOUNTER — Encounter: Payer: Self-pay | Admitting: Radiation Oncology

## 2015-12-01 VITALS — BP 178/78 | HR 80 | Temp 97.7°F | Ht 64.0 in | Wt 137.1 lb

## 2015-12-01 DIAGNOSIS — C50412 Malignant neoplasm of upper-outer quadrant of left female breast: Secondary | ICD-10-CM

## 2015-12-01 DIAGNOSIS — Z510 Encounter for antineoplastic radiation therapy: Secondary | ICD-10-CM | POA: Diagnosis not present

## 2015-12-01 NOTE — Progress Notes (Signed)
Dawn [name] has received 9 fractions to her left breast.  Note firmness of breast beneath the incisional region as well as, the whole breast.  She reports level 5 pain at this time.  Note erythema.  Skin remains intact and no changes noted in the inframmary region.  She naps once daily which is her normal routine.

## 2015-12-01 NOTE — Progress Notes (Signed)
   Weekly Management Note:  Outpatient    ICD-9-CM ICD-10-CM   1. Breast cancer of upper-outer quadrant of left female breast (HCC) 174.4 C50.412     Current Dose:  24.03 Gy  Projected Dose: 50.05 Gy   Narrative:  The patient presents for routine under treatment assessment.  CBCT/MVCT images/Port film x-rays were reviewed.  The chart was checked. Continued firmness at seroma, continued shooting pains that come and go in left breast. No fevers or chills  Physical Findings:  height is '5\' 4"'$  (1.626 m) and weight is 137 lb 1.6 oz (62.188 kg). Her temperature is 97.7 F (36.5 C). Her blood pressure is 178/78 and her pulse is 80.   Wt Readings from Last 3 Encounters:  12/01/15 137 lb 1.6 oz (62.188 kg)  11/24/15 139 lb 11.2 oz (63.368 kg)  10/15/15 142 lb 6.4 oz (64.592 kg)   NAD, stable mild warmth / erythema over left breast ; + stable seroma  Impression:  The patient is tolerating radiotherapy.  Plan:  Continue radiotherapy as planned.   ________________________________   Eppie Gibson, M.D.

## 2015-12-02 ENCOUNTER — Ambulatory Visit
Admission: RE | Admit: 2015-12-02 | Discharge: 2015-12-02 | Disposition: A | Discharge status: Home / Self Care | Payer: Medicare Other | Source: Ambulatory Visit | Attending: Radiation Oncology | Admitting: Radiation Oncology

## 2015-12-02 ENCOUNTER — Encounter: Payer: Self-pay | Admitting: *Deleted

## 2015-12-02 DIAGNOSIS — Z510 Encounter for antineoplastic radiation therapy: Secondary | ICD-10-CM | POA: Diagnosis not present

## 2015-12-03 ENCOUNTER — Ambulatory Visit
Admission: RE | Admit: 2015-12-03 | Discharge: 2015-12-03 | Disposition: A | Discharge status: Home / Self Care | Payer: Medicare Other | Source: Ambulatory Visit | Attending: Radiation Oncology | Admitting: Radiation Oncology

## 2015-12-03 ENCOUNTER — Encounter: Payer: Self-pay | Admitting: *Deleted

## 2015-12-03 DIAGNOSIS — Z510 Encounter for antineoplastic radiation therapy: Secondary | ICD-10-CM | POA: Diagnosis not present

## 2015-12-03 NOTE — Progress Notes (Addendum)
Ms. Cumpian seen by Dr. Erroll Luna yesterday regarding her concern related to fluid "build up" in her left lateral breast.  She reports that this area was aspirated twice since her surgery.  Note bright erythema of this breast. She has received 11 fractions thus far.  Her main concern today, is that she was given an unknown antibiotic yesterday and does not understand why, and it's purpose.  The first dose resulted in diarrhea, therefore she does not want to take it at this time.  Encouraged to call [facility] to relay this intolerance and she stated she would do this today.  Temp 97.4

## 2015-12-04 ENCOUNTER — Ambulatory Visit
Admission: RE | Admit: 2015-12-04 | Discharge: 2015-12-04 | Disposition: A | Discharge status: Home / Self Care | Payer: Medicare Other | Source: Ambulatory Visit | Attending: Radiation Oncology | Admitting: Radiation Oncology

## 2015-12-04 DIAGNOSIS — Z510 Encounter for antineoplastic radiation therapy: Secondary | ICD-10-CM | POA: Diagnosis not present

## 2015-12-05 ENCOUNTER — Ambulatory Visit
Admission: RE | Admit: 2015-12-05 | Discharge: 2015-12-05 | Disposition: A | Discharge status: Home / Self Care | Payer: Medicare Other | Source: Ambulatory Visit | Attending: Radiation Oncology | Admitting: Radiation Oncology

## 2015-12-05 DIAGNOSIS — Z510 Encounter for antineoplastic radiation therapy: Secondary | ICD-10-CM | POA: Diagnosis not present

## 2015-12-08 ENCOUNTER — Encounter: Payer: Self-pay | Admitting: Radiation Oncology

## 2015-12-08 ENCOUNTER — Ambulatory Visit
Admission: RE | Admit: 2015-12-08 | Discharge: 2015-12-08 | Disposition: A | Discharge status: Home / Self Care | Payer: Medicare Other | Source: Ambulatory Visit | Attending: Radiation Oncology | Admitting: Radiation Oncology

## 2015-12-08 VITALS — BP 166/84 | HR 86 | Temp 98.4°F | Ht 64.0 in | Wt 140.9 lb

## 2015-12-08 DIAGNOSIS — C50412 Malignant neoplasm of upper-outer quadrant of left female breast: Secondary | ICD-10-CM | POA: Insufficient documentation

## 2015-12-08 DIAGNOSIS — Z923 Personal history of irradiation: Secondary | ICD-10-CM | POA: Insufficient documentation

## 2015-12-08 DIAGNOSIS — R5383 Other fatigue: Secondary | ICD-10-CM | POA: Insufficient documentation

## 2015-12-08 DIAGNOSIS — Z510 Encounter for antineoplastic radiation therapy: Secondary | ICD-10-CM | POA: Diagnosis not present

## 2015-12-08 NOTE — Progress Notes (Signed)
Dawn [name] is here for her 14th fraction of radiation to her Left Breast. Her Left Breast is red, warm, swollen. She complains of pain a 5/10 to the breast. She states she has some itching to the upper center of her breast area. The area underneath her breast appears to have a rash like appearance. She states her nipple area is very painful to touch which is new. She is using the radiaplex cream twice daily. She is on an antibiotic (prescribed by Dr. Malachi Paradise). She is not sure of the name, and she also is only taking it once daily instead of twice because of diarrhea. She report some fatigue, and states she is sleeping a lot.  BP 166/84 mmHg  Pulse 86  Temp(Src) 98.4 F (36.9 C)  Ht '5\' 4"'$  (1.626 m)  Wt 140 lb 14.4 oz (63.912 kg)  BMI 24.17 kg/m2

## 2015-12-08 NOTE — Progress Notes (Signed)
   Weekly Management Note:  Outpatient    ICD-9-CM ICD-10-CM   1. Breast cancer of upper-outer quadrant of left female breast (HCC) 174.4 C50.412     Current Dose:  37.38 Gy  Projected Dose: 50.05 Gy   Narrative:  The patient presents for routine under treatment assessment.  CBCT/MVCT images/Port film x-rays were reviewed.  The chart was checked.   Her Left Breast is red, warm, swollen. She complains of pain a 5/10 to the breast. She states she has some itching to the upper center of her breast area.  She states her nipple area is very painful to touch which is new. She is using the radiaplex cream twice daily. She is on an antibiotic (prescribed by Dr. Brantley Stage). She is not sure of the name, and she also is only taking it once daily instead of twice because of diarrhea. She report some fatigue, and states she is sleeping a lot. No fevers or chills  Physical Findings:  height is '5\' 4"'$  (1.626 m) and weight is 140 lb 14.4 oz (63.912 kg). Her temperature is 98.4 F (36.9 C). Her blood pressure is 166/84 and her pulse is 86.   Wt Readings from Last 3 Encounters:  12/08/15 140 lb 14.4 oz (63.912 kg)  12/01/15 137 lb 1.6 oz (62.188 kg)  11/24/15 139 lb 11.2 oz (63.368 kg)   NAD,  erythema over left breast ; +  Seroma, skin intact, dermatitis at IM fold.  Impression:  The patient is tolerating radiotherapy.  Plan:  Continue radiotherapy as planned.  Hydrogel pads given for soreness/warmth over breast. Try Aleve BID as well. Advised patient to discuss antibiotic intolerance today with the [facility] nurse at Dr Cornett's office.  ________________________________   Eppie Gibson, M.D.

## 2015-12-09 ENCOUNTER — Ambulatory Visit: Payer: Medicare Other

## 2015-12-09 DIAGNOSIS — Z510 Encounter for antineoplastic radiation therapy: Secondary | ICD-10-CM | POA: Diagnosis not present

## 2015-12-10 ENCOUNTER — Ambulatory Visit: Payer: Medicare Other

## 2015-12-10 DIAGNOSIS — Z510 Encounter for antineoplastic radiation therapy: Secondary | ICD-10-CM | POA: Diagnosis not present

## 2015-12-11 ENCOUNTER — Ambulatory Visit
Admission: RE | Admit: 2015-12-11 | Discharge: 2015-12-11 | Disposition: A | Discharge status: Home / Self Care | Payer: Medicare Other | Source: Ambulatory Visit | Attending: Radiation Oncology | Admitting: Radiation Oncology

## 2015-12-11 DIAGNOSIS — Z510 Encounter for antineoplastic radiation therapy: Secondary | ICD-10-CM | POA: Diagnosis not present

## 2015-12-12 ENCOUNTER — Ambulatory Visit
Admission: RE | Admit: 2015-12-12 | Discharge: 2015-12-12 | Disposition: A | Discharge status: Home / Self Care | Payer: Medicare Other | Source: Ambulatory Visit | Attending: Radiation Oncology | Admitting: Radiation Oncology

## 2015-12-12 DIAGNOSIS — Z510 Encounter for antineoplastic radiation therapy: Secondary | ICD-10-CM | POA: Diagnosis not present

## 2015-12-15 ENCOUNTER — Ambulatory Visit
Admission: RE | Admit: 2015-12-15 | Discharge: 2015-12-15 | Disposition: A | Discharge status: Home / Self Care | Payer: Medicare Other | Source: Ambulatory Visit | Attending: Radiation Oncology | Admitting: Radiation Oncology

## 2015-12-15 ENCOUNTER — Encounter: Payer: Self-pay | Admitting: Radiation Oncology

## 2015-12-15 ENCOUNTER — Ambulatory Visit: Payer: Medicare Other

## 2015-12-15 VITALS — BP 124/93 | HR 83 | Temp 97.8°F | Wt 139.0 lb

## 2015-12-15 DIAGNOSIS — C50412 Malignant neoplasm of upper-outer quadrant of left female breast: Secondary | ICD-10-CM

## 2015-12-15 DIAGNOSIS — Z510 Encounter for antineoplastic radiation therapy: Secondary | ICD-10-CM | POA: Diagnosis not present

## 2015-12-15 NOTE — Progress Notes (Signed)
   Weekly Management Note:  Outpatient    ICD-9-CM ICD-10-CM   1. Breast cancer of upper-outer quadrant of left female breast (HCC) 174.4 C50.412     Current Dose:   48.05 Gy  Projected Dose: 50.05 Gy   Narrative:  The patient presents for routine under treatment assessment.  CBCT/MVCT images/Port film x-rays were reviewed.  The chart was checked.    Pain is stable, doesn't feel the need to take anything for it.  Some itching. Afebrile. Using radiaplex.   Physical Findings:  weight is 139 lb (63.05 kg). Her temperature is 97.8 F (36.6 C). Her blood pressure is 124/93 and her pulse is 83.   Wt Readings from Last 3 Encounters:  12/15/15 139 lb (63.05 kg)  12/08/15 140 lb 14.4 oz (63.912 kg)  12/01/15 137 lb 1.6 oz (62.188 kg)   NAD,  erythema over left breast ; +  Seroma, skin intact, bright erythema at IM fold.  Impression:  The patient is tolerating radiotherapy.  Plan:  Continue radiotherapy as planned. hydrocortisone 1% cream PRN itching.  F/u in 1 mo, sooner if needed. Sees med/onc this Friday to discuss anti estrogen therapy. ________________________________   Eppie Gibson, M.D.

## 2015-12-15 NOTE — Progress Notes (Signed)
Ms. Goguen is here for her 19th fraction of radiation to her Left Breast. She reports some fatigue, and is sleeping during the day often. She is still able to complete her normal activities. She reports pain a 5/10 over her Left Breast, especially her nipple area. Her breast is red, swollen. She is also red underneath her breast. She reports some itching over the breast also. She is using the radiaplex cream twice daily as directed. She was given a follow up appointment for one month  BP 124/93 mmHg  Pulse 83  Temp(Src) 97.8 F (36.6 C)  Wt 139 lb (63.05 kg)

## 2015-12-16 ENCOUNTER — Encounter: Payer: Self-pay | Admitting: Radiation Oncology

## 2015-12-16 ENCOUNTER — Ambulatory Visit
Admission: RE | Admit: 2015-12-16 | Discharge: 2015-12-16 | Disposition: A | Discharge status: Home / Self Care | Payer: Medicare Other | Source: Ambulatory Visit | Attending: Radiation Oncology | Admitting: Radiation Oncology

## 2015-12-16 DIAGNOSIS — Z510 Encounter for antineoplastic radiation therapy: Secondary | ICD-10-CM | POA: Diagnosis not present

## 2015-12-18 ENCOUNTER — Other Ambulatory Visit: Payer: Self-pay | Admitting: Adult Health

## 2015-12-18 ENCOUNTER — Telehealth: Payer: Self-pay | Admitting: *Deleted

## 2015-12-18 DIAGNOSIS — C50412 Malignant neoplasm of upper-outer quadrant of left female breast: Secondary | ICD-10-CM

## 2015-12-18 NOTE — Telephone Encounter (Signed)
Spoke with patient to follow up after XRT completion. She states she is doing well.  Discussed survivorship program.  Encouraged her to call with any needs or concerns.

## 2015-12-18 NOTE — Assessment & Plan Note (Signed)
Left breast lumpectomy 09/17/2015: Invasive ductal carcinoma with calcifications, grade 1, 2.5 cm, low-grade DCIS, LVI present, 0/1 lymph nodes negative, T2 N0 stage II a, ER 100%, PR 20%, HER-2 negative, Ki-67 20% Oncotype DX score 9, 7% risk of recurrence Adjuvant radiation therapy from 11/18/2015 to 12/16/2015  Recommendations: Adjuvant antiestrogen therapy with anastrozole 1 mg daily 5 years  Anastrozole counseling:We discussed the risks and benefits of anti-estrogen therapy with aromatase inhibitors. These include but not limited to insomnia, hot flashes, mood changes, vaginal dryness, bone density loss, and weight gain. We strongly believe that the benefits far outweigh the risks. Patient understands these risks and consented to starting treatment. Planned treatment duration is 5 years.  Tobacco abuse: She does not prepared to quit smoking. She was offered counseling. She was not interested. Return to clinic in 3 months for follow-up

## 2015-12-19 ENCOUNTER — Telehealth: Payer: Self-pay | Admitting: Hematology and Oncology

## 2015-12-19 ENCOUNTER — Ambulatory Visit (HOSPITAL_BASED_OUTPATIENT_CLINIC_OR_DEPARTMENT_OTHER): Payer: Medicare Other | Admitting: Hematology and Oncology

## 2015-12-19 ENCOUNTER — Encounter: Payer: Self-pay | Admitting: Hematology and Oncology

## 2015-12-19 VITALS — BP 161/73 | HR 85 | Temp 98.0°F | Resp 18 | Ht 64.0 in | Wt 139.5 lb

## 2015-12-19 DIAGNOSIS — Z720 Tobacco use: Secondary | ICD-10-CM

## 2015-12-19 DIAGNOSIS — C50412 Malignant neoplasm of upper-outer quadrant of left female breast: Secondary | ICD-10-CM | POA: Diagnosis not present

## 2015-12-19 DIAGNOSIS — Z170 Estrogen receptor positive status [ER+]: Secondary | ICD-10-CM | POA: Diagnosis not present

## 2015-12-19 DIAGNOSIS — Z79811 Long term (current) use of aromatase inhibitors: Secondary | ICD-10-CM

## 2015-12-19 MED ORDER — ANASTROZOLE 1 MG PO TABS: 1.0000 mg | ORAL_TABLET | Freq: Every day | ORAL | Status: DC

## 2015-12-19 NOTE — Telephone Encounter (Signed)
left msg for appt on 6/29 @ 10:15

## 2015-12-19 NOTE — Progress Notes (Signed)
Unable to get in to exam room prior to MD.  No assessment performed.  

## 2015-12-19 NOTE — Telephone Encounter (Signed)
lvm for pt regarding to [facility] appt

## 2015-12-19 NOTE — Progress Notes (Signed)
Patient Care Team: Lona Kettle, MD as PCP - General (Family Medicine) Excell Seltzer, MD as Consulting Physician (General Surgery) Nicholas Lose, MD as Consulting Physician (Hematology and Oncology) Arloa Koh, MD as Consulting Physician (Radiation Oncology) Sylvan Cheese, NP as Nurse Practitioner (Hematology and Oncology)  DIAGNOSIS: Breast cancer of upper-outer quadrant of left female breast [facility])   Staging form: Breast, AJCC 7th Edition     Clinical stage from 08/20/2015: Stage IIA (T2, N0, M0) - Unsigned   SUMMARY OF ONCOLOGIC HISTORY:   Breast cancer of upper-outer quadrant of left female breast ([place])   08/14/2015 Initial Diagnosis left breast biopsy: Invasive ductal carcinoma with DCIS grade 1-2, ER 100%, PR 20%, Ki-67 20%, HER-2 negative ratio 1.77   08/14/2015 Mammogram left breast focal asymmetry, ultrasound revealed 2.5 cm lesion. Because of breast pain, patient refuses diagnostic mammograms.   09/17/2015 Surgery left breast lumpectomy: Invasive ductal carcinoma with calcifications, grade 1, 2.5 cm, low-grade DCIS, LVI present, 0/1 lymph nodes negative, T2 N0 stage II a, ER 100%, PR 20%, HER-2 negative, Ki-67 20%   11/18/2015 - 12/16/2015 Radiation Therapy adjuvant radiation therapy   12/19/2015 -  Anti-estrogen oral therapy Anastrozole 1 mg daily 5 years    CHIEF COMPLIANT: Follow-up after radiation therapy is complete to discuss next treatment plan  INTERVAL HISTORY: Dawn [name] is a 75 year old with above-mentioned history of left breast cancer treated with lumpectomy and adjuvant radiation therapy and is here today to discuss starting antiestrogen therapy. She has done quite well with radiation with the radiation dermatitis but it does not seem to be bothering her. She continues to smoke cigarettes.  REVIEW OF SYSTEMS:   Constitutional: Denies fevers, chills or abnormal weight loss Eyes: Denies blurriness of vision Ears, nose, mouth, throat, and face: Denies  mucositis or sore throat Respiratory: Denies cough, dyspnea or wheezes Cardiovascular: Denies palpitation, chest discomfort Gastrointestinal:  Denies nausea, heartburn or change in bowel habits Skin: Denies abnormal skin rashes Lymphatics: Denies new lymphadenopathy or easy bruising Neurological:Denies numbness, tingling or new weaknesses Behavioral/Psych: Mood is stable, no new changes  Extremities: No lower extremity edema Breast:  Radiation dermatitis All other systems were reviewed with the patient and are negative.  I have reviewed the past medical history, past surgical history, social history and family history with the patient and they are unchanged from previous note.  ALLERGIES:  is allergic to pneumococcal vaccines and prandin.  MEDICATIONS:  Current Outpatient Prescriptions  Medication Sig Dispense Refill  . acarbose (PRECOSE) 25 MG tablet Take 25 mg by mouth 3 (three) times daily with meals.     Marland Kitchen anastrozole (ARIMIDEX) 1 MG tablet Take 1 tablet (1 mg total) by mouth daily. 90 tablet 3  . aspirin EC 81 MG tablet Take 81 mg by mouth daily.    Marland Kitchen buPROPion (WELLBUTRIN XL) 300 MG 24 hr tablet Take 300 mg by mouth daily.    . Cholecalciferol (VITAMIN D3) 5000 UNITS TABS Take 5,000 Units by mouth daily.    Marland Kitchen emollient (RADIAGEL) gel Apply topically as needed for wound care.    Marland Kitchen glipiZIDE (GLUCOTROL XL) 2.5 MG 24 hr tablet Take 2.5 mg by mouth daily with breakfast.    . metFORMIN (GLUCOPHAGE-XR) 500 MG 24 hr tablet Take 1,000 mg by mouth 2 (two) times daily.    . non-metallic deodorant Jethro Poling) MISC Apply 1 application topically daily as needed.    . pravastatin (PRAVACHOL) 40 MG tablet Take 40 mg by mouth daily.    Marland Kitchen  quinapril (ACCUPRIL) 40 MG tablet Take 40 mg by mouth daily.     . verapamil (CALAN-SR) 240 MG CR tablet Take 240 mg by mouth daily.     No current facility-administered medications for this visit.    PHYSICAL EXAMINATION: ECOG PERFORMANCE STATUS: 1 -  Symptomatic but completely ambulatory  Filed Vitals:   12/19/15 0759  BP: 161/73  Pulse: 85  Temp: 98 F (36.7 C)  Resp: 18   Filed Weights   12/19/15 0759  Weight: 139 lb 8 oz (63.277 kg)    GENERAL:alert, no distress and comfortable SKIN: skin color, texture, turgor are normal, no rashes or significant lesions EYES: normal, Conjunctiva are pink and non-injected, sclera clear OROPHARYNX:no exudate, no erythema and lips, buccal mucosa, and tongue normal  NECK: supple, thyroid normal size, non-tender, without nodularity LYMPH:  no palpable lymphadenopathy in the cervical, axillary or inguinal LUNGS: clear to auscultation and percussion with normal breathing effort HEART: regular rate & rhythm and no murmurs and no lower extremity edema ABDOMEN:abdomen soft, non-tender and normal bowel sounds MUSCULOSKELETAL:no cyanosis of digits and no clubbing  NEURO: alert & oriented x 3 with fluent speech, no focal motor/sensory deficits EXTREMITIES: No lower extremity edema  LABORATORY DATA:  I have reviewed the data as listed   Chemistry      Component Value Date/Time   NA 135 09/09/2015 0904   NA 137 08/20/2015 1232   K 4.6 09/09/2015 0904   K 4.3 08/20/2015 1232   CL 99* 09/09/2015 0904   CO2 25 09/09/2015 0904   CO2 25 08/20/2015 1232   BUN 6 09/09/2015 0904   BUN 10.4 08/20/2015 1232   CREATININE 0.81 09/09/2015 0904   CREATININE 1.1 08/20/2015 1232      Component Value Date/Time   CALCIUM 9.4 09/09/2015 0904   CALCIUM 10.0 08/20/2015 1232   ALKPHOS 142 08/20/2015 1232   AST 18 08/20/2015 1232   ALT 11 08/20/2015 1232   BILITOT 0.32 08/20/2015 1232       Lab Results  Component Value Date   WBC 6.4 09/09/2015   HGB 14.4 09/09/2015   HCT 43.9 09/09/2015   MCV 89.0 09/09/2015   PLT 317 09/09/2015   NEUTROABS 3.3 08/20/2015   ASSESSMENT & PLAN:  Breast cancer of upper-outer quadrant of left female breast ([place]) Left breast lumpectomy 09/17/2015: Invasive ductal  carcinoma with calcifications, grade 1, 2.5 cm, low-grade DCIS, LVI present, 0/1 lymph nodes negative, T2 N0 stage II a, ER 100%, PR 20%, HER-2 negative, Ki-67 20% Oncotype DX score 9, 7% risk of recurrence Adjuvant radiation therapy from 11/18/2015 to 12/16/2015  Recommendations: Adjuvant antiestrogen therapy with anastrozole 1 mg daily 5 years  Anastrozole counseling:We discussed the risks and benefits of anti-estrogen therapy with aromatase inhibitors. These include but not limited to insomnia, hot flashes, mood changes, vaginal dryness, bone density loss, and weight gain. We strongly believe that the benefits far outweigh the risks. Patient understands these risks and consented to starting treatment. Planned treatment duration is 5 years.  Tobacco abuse: She is not prepared to quit smoking. She was offered counseling. She was not interested.  Return to clinic in 3 months for follow-up   No orders of the defined types were placed in this encounter.   The patient has a good understanding of the overall plan. she agrees with it. she will call with any problems that may develop before the next visit here.   Rulon Eisenmenger, MD 12/19/2015

## 2015-12-26 NOTE — Progress Notes (Signed)
  Radiation Oncology         (336) [phone] ________________________________  Name: BRITTANE GRUDZINSKI MRN: [id number]  Date: 12/16/2015  DOB: 1941/01/19  End of Treatment Note  Diagnosis:  Left breast cancer      Indication for treatment:  Curative        Radiation treatment dates:   11/18/15- 12/16/15  Site/dose:   Left breast treated to 40.05 Gy in 15 fractions, Left breast boost treated to 10 Gy in 5 fractions  Beams/energy:   Left breast: 3D-Conformal/ 6X , Left breast boost: Electron Monte Carlo/ 15 MeV  Narrative: The patient tolerated radiation treatment relatively well.     Plan: The patient has completed radiation treatment. The patient will return to radiation oncology clinic for routine followup in one month. I advised them to call or return sooner if they have any questions or concerns related to their recovery or treatment.  -----------------------------------  Eppie Gibson, MD  This document serves as a record of services personally performed by Eppie Gibson, MD. It was created on her behalf by Derek Mound, a trained medical scribe. The creation of this record is based on the scribe's personal observations and the provider's statements to them. This document has been checked and approved by the attending provider.

## 2016-01-15 ENCOUNTER — Encounter: Payer: Self-pay | Admitting: Radiation Oncology

## 2016-01-16 ENCOUNTER — Ambulatory Visit
Admission: RE | Admit: 2016-01-16 | Discharge: 2016-01-16 | Disposition: A | Discharge status: Home / Self Care | Payer: Medicare Other | Source: Ambulatory Visit | Attending: Radiation Oncology | Admitting: Radiation Oncology

## 2016-01-16 ENCOUNTER — Encounter: Payer: Self-pay | Admitting: Radiation Oncology

## 2016-01-16 VITALS — BP 143/78 | HR 86 | Temp 98.1°F | Ht 64.0 in | Wt 136.3 lb

## 2016-01-16 DIAGNOSIS — C50412 Malignant neoplasm of upper-outer quadrant of left female breast: Secondary | ICD-10-CM

## 2016-01-16 HISTORY — DX: Reserved for concepts with insufficient information to code with codable children: IMO0002

## 2016-01-16 HISTORY — DX: Reserved for inherently not codable concepts without codable children: IMO0001

## 2016-01-16 NOTE — Progress Notes (Addendum)
Radiation Oncology         (336) [phone] ________________________________  Name: Dawn [name] MRN: [id number]  Date: 01/16/2016  DOB: 08-16-1941  Follow-Up Visit Note  Outpatient  CC:  Melinda Crutch, MD  Nicholas Lose, MD  Diagnosis and Prior Radiotherapy:    ICD-9-CM ICD-10-CM   1. Breast cancer of upper-outer quadrant of left female breast ([place]) 174.4 C50.412     Radiation treatment dates:   11/18/15- 12/16/15 Site/dose:   Left breast treated to 40.5 Gy in 15 fractions, Left breast boost treated to 10 Gy in 5 fractions Beams/energy:   Left breast: 3D-Conformal/ 6X , Left breast boost: Electron Monte Carlo/ 15 MeV  Narrative:  The patient returns today for routine follow-up.  She reports some mild fatigue after certain activities, but states it has improved. She reports pain in her left breast at a 4/10. She still has some knots, which she reports have improved. The pain is sometimes under her nipple or to the left or right. These areas are very tender to touch when she has these pains. Her skin is still hyperpigmented, and there is peeling noted to her upper outer breast and her nipple noted by the nurse. She recently started Anastrozole. She reports she had a recent fall. She bruised her wrist at this time, but did not see a physician for this. She reports that she called her physician Dr. Harrington Challenger to discuss the solitary episode.                             ALLERGIES:  is allergic to pneumococcal vaccines and prandin.  Meds: Current Outpatient Prescriptions  Medication Sig Dispense Refill  . acarbose (PRECOSE) 25 MG tablet Take 25 mg by mouth 3 (three) times daily with meals.     Marland Kitchen anastrozole (ARIMIDEX) 1 MG tablet Take 1 tablet (1 mg total) by mouth daily. 90 tablet 3  . aspirin EC 81 MG tablet Take 81 mg by mouth daily.    Marland Kitchen buPROPion (WELLBUTRIN XL) 300 MG 24 hr tablet Take 300 mg by mouth daily.    . Cholecalciferol (VITAMIN D3) 5000 UNITS TABS Take 5,000 Units by mouth daily.    Marland Kitchen  emollient (RADIAGEL) gel Apply topically as needed for wound care.    Marland Kitchen glipiZIDE (GLUCOTROL XL) 2.5 MG 24 hr tablet Take 2.5 mg by mouth daily with breakfast.    . metFORMIN (GLUCOPHAGE-XR) 500 MG 24 hr tablet Take 1,000 mg by mouth 2 (two) times daily.    . pravastatin (PRAVACHOL) 40 MG tablet Take 40 mg by mouth daily.    . quinapril (ACCUPRIL) 40 MG tablet Take 40 mg by mouth daily.     . verapamil (CALAN-SR) 240 MG CR tablet Take 240 mg by mouth daily.     No current facility-administered medications for this encounter.    Physical Findings:  height is '5\' 4"'$  (1.626 m) and weight is 136 lb 4.8 oz (61.825 kg). Her temperature is 98.1 F (36.7 C). Her blood pressure is 143/78 and her pulse is 86.   General: Alert and oriented, in no acute distress. Psychiatric: Judgment and insight are intact. Affect is appropriate. Breast: Over her left breast, she has residual hyperpigmentation and a  residual dry desquamation which is fairly superficial.   Lab Findings: Lab Results  Component Value Date   WBC 6.4 09/09/2015   HGB 14.4 09/09/2015   HCT 43.9 09/09/2015   MCV 89.0 09/09/2015  PLT 317 09/09/2015    Radiographic Findings: No results found.  Impression/Plan: Healing well.  I encouraged her to continue with yearly mammography and followup with medical oncology. I will see her back on an as-needed basis. I have encouraged her to call if she has any issues or concerns in the future. I wished her the very best. I suggested that she use vitamin E oil TID for her breast skin dryness/  hyperpigmentation.  Recent fall - defer to management by PCP. She denies injury other than bruises.  _____________________________   Eppie Gibson, MD  This document serves as a record of services personally performed by Eppie Gibson, MD. It was created on her behalf by Jenell Milliner, a trained medical scribe. The creation of this record is based on the scribe's personal observations and the provider's  statements to them. This document has been checked and approved by the attending provider

## 2016-01-16 NOTE — Progress Notes (Addendum)
Ms. Bieda presents for follow up of radiation to her Left Breast completed 12/16/15. She reports some mild fatigue after certain activities, but states it has improved. She reports pain in her 4/10 Left Breast. She still has some knots, which she reports have improved. The pain is sometimes under her nipple or to the left or right. These areas are very tender to touch when she has these pains. Her skin is hyperpigmented still, and there is peeling noted to her upper outer breast and her nipple.   BP 143/78 mmHg  Pulse 86  Temp(Src) 98.1 F (36.7 C)  Ht '5\' 4"'$  (1.626 m)  Wt 136 lb 4.8 oz (61.825 kg)  BMI 23.38 kg/m2   Wt Readings from Last 3 Encounters:  01/16/16 136 lb 4.8 oz (61.825 kg)  12/19/15 139 lb 8 oz (63.277 kg)  12/15/15 139 lb (63.05 kg)

## 2016-02-02 NOTE — Progress Notes (Signed)
Electrom Boost Treatment Planning Note 11-26-15 Diagnosis: Breast Cancer  The patient's CT images from her free-breathing simulation were reviewed to plan her boost treatment to her left breast  lumpectomy cavity.  The boost to the lumpectomy cavity will be delivered with 15 MeV electrons, with an en face field, and custom electron cut out block. Special Port plan approved.  10 Gy in 5 fractions has been prescribed to the 100% isodose line.  -----------------------------------  Eppie Gibson, MD

## 2016-02-12 ENCOUNTER — Encounter: Payer: Self-pay | Admitting: Nurse Practitioner

## 2016-02-12 ENCOUNTER — Ambulatory Visit (HOSPITAL_BASED_OUTPATIENT_CLINIC_OR_DEPARTMENT_OTHER): Payer: Medicare Other | Admitting: Nurse Practitioner

## 2016-02-12 ENCOUNTER — Encounter: Payer: Medicare Other | Admitting: Nurse Practitioner

## 2016-02-12 VITALS — BP 182/84 | HR 84 | Temp 98.2°F | Resp 18 | Ht 64.0 in | Wt 140.2 lb

## 2016-02-12 DIAGNOSIS — Z79811 Long term (current) use of aromatase inhibitors: Secondary | ICD-10-CM | POA: Diagnosis not present

## 2016-02-12 DIAGNOSIS — C50412 Malignant neoplasm of upper-outer quadrant of left female breast: Secondary | ICD-10-CM | POA: Diagnosis not present

## 2016-02-12 DIAGNOSIS — Z720 Tobacco use: Secondary | ICD-10-CM

## 2016-02-12 DIAGNOSIS — Z170 Estrogen receptor positive status [ER+]: Secondary | ICD-10-CM | POA: Diagnosis not present

## 2016-02-12 NOTE — Progress Notes (Signed)
CLINIC:  Cancer Survivorship   REASON FOR VISIT:  Routine follow-up post-treatment for a recent history of breast cancer.  BRIEF ONCOLOGIC HISTORY:    Breast cancer of upper-outer quadrant of left female breast ([place])   08/14/2015 Mammogram left breast focal asymmetry, ultrasound revealed 2.5 cm lesion. Because of breast pain, patient refuses diagnostic mammograms.   08/14/2015 Initial Diagnosis left breast biopsy: Invasive ductal carcinoma with DCIS grade 1-2, ER 100%, PR 20%, Ki-67 20%, HER-2 negative ratio 1.77   08/14/2015 Clinical Stage Stage IIA: T2 N0   09/17/2015 Surgery left breast lumpectomy: Invasive ductal carcinoma with calcifications, grade 1, 2.5 cm, low-grade DCIS, LVI present, 0/1 lymph nodes negative, ER 100%, PR 20%, HER-2 negative, Ki-67 20%   09/17/2015 Pathologic Stage Stage IIA: T2 N0   11/18/2015 - 12/16/2015 Radiation Therapy Adjuvant radiation therapy: Left breast treated to 40.05 Gy in 15 fractions, Left breast boost treated to 10 Gy in 5 fractions   12/19/2015 -  Anti-estrogen oral therapy Anastrozole 1 mg daily 5 years    INTERVAL HISTORY:  Ms. Lansky presents to the [facility] today for our initial meeting to review her survivorship care plan detailing her treatment course for breast cancer, as well as monitoring long-term side effects of that treatment, education regarding health maintenance, screening, and overall wellness and health promotion.     Overall, Ms. Stegenga reports feeling quite well since completing her radiation therapy approximately one month ago.  She continues with fatigue but reports that the skin changes overlying her left breast have improved.  She does report some thickening in her left breast since completion of the radiation but she denies any mass or lesion. She has had no headache or shortness of breath.  She does have some occasional shooting pain along her nipple following surgery, but this resolves without intervention.  She is tolerating  the anastrozole without significant joint pain, hot flashes, or vaginal changes.  She has a good appetite and denies any weight loss.  She states that she has some difficulty sleeping and naps throughout the day.  She has some forgetfulness from time to time.    REVIEW OF SYSTEMS:  General: Fatigue as above.  Denies fever, chills, unintentional weight loss, or night sweats.   HEENT: Denies visual changes, hearing loss, mouth sores or difficulty swallowing. Cardiac: Denies palpitations, chest pain, and lower extremity edema.  Respiratory: Denies wheeze or dyspnea on exertion.  Breast: As above. GI: Denies abdominal pain, constipation, diarrhea, nausea, or vomiting.  GU: Denies dysuria, hematuria, vaginal bleeding, vaginal discharge, or vaginal dryness.  Musculoskeletal: Denies joint or bone pain.  Neuro: Denies recent fall or numbness / tingling in her extremities. Skin: Denies rash, pruritis, or open wounds.  Psych: Some insomnia as above. Denies depression, anxiety, or memory loss.   A 14-point review of systems was completed and was negative, except as noted above.   ONCOLOGY TREATMENT TEAM:  1. Surgeon:  Dr. Excell Seltzer at [facility]  2. Medical Oncologist: Dr. Lindi Adie 3. Radiation Oncologist: Dr. Isidore Moos    PAST MEDICAL/SURGICAL HISTORY:  Past Medical History  Diagnosis Date  . Diabetes mellitus without complication ([place])   . High cholesterol   . Breast cancer of upper-outer quadrant of left female breast ([place]) 08/18/2015  . Hypertension   . Depression   . COPD (chronic obstructive pulmonary disease) [facility])     Hospitalization March 2011  . History of home oxygen therapy   . Pneumonia   . Anxiety   .  Full dentures   . Broken wrist     when younger x2  . H/O vaginal delivery 1962    x1   . Radiation     11/18/15- 12/16/15 to her Left Breast   Past Surgical History  Procedure Laterality Date  . Abdominal hysterectomy    . Appendectomy    . Cholecystectomy    .  Tonsillectomy    . Abdominal hysterectomy    . Eye surgery      cataracts  . Radioactive seed guided mastectomy with axillary sentinel lymph node biopsy Left 09/17/2015    Procedure: RADIOACTIVE SEED LOCALIZATION LEFT BREAST LUMPECTOMY AND LEFT AXILLARY SENTINEL LYMPH NODE BIOPSY;  Surgeon: Excell Seltzer, MD;  Location: [facility];  Service: General;  Laterality: Left;     ALLERGIES:  Allergies  Allergen Reactions  . Pneumococcal Vaccines Hives and Swelling    Arm swelled up  . Prandin [Repaglinide] Other (See Comments)    Drops BS too low.     CURRENT MEDICATIONS:  Current Outpatient Prescriptions on File Prior to Visit  Medication Sig Dispense Refill  . acarbose (PRECOSE) 25 MG tablet Take 25 mg by mouth 3 (three) times daily with meals.     Marland Kitchen anastrozole (ARIMIDEX) 1 MG tablet Take 1 tablet (1 mg total) by mouth daily. 90 tablet 3  . aspirin EC 81 MG tablet Take 81 mg by mouth daily.    Marland Kitchen buPROPion (WELLBUTRIN XL) 300 MG 24 hr tablet Take 300 mg by mouth daily.    . Cholecalciferol (VITAMIN D3) 5000 UNITS TABS Take 5,000 Units by mouth daily.    Marland Kitchen glipiZIDE (GLUCOTROL XL) 2.5 MG 24 hr tablet Take 2.5 mg by mouth daily with breakfast.    . metFORMIN (GLUCOPHAGE-XR) 500 MG 24 hr tablet Take 1,000 mg by mouth 2 (two) times daily.    . pravastatin (PRAVACHOL) 40 MG tablet Take 40 mg by mouth daily.    . quinapril (ACCUPRIL) 40 MG tablet Take 40 mg by mouth daily.     . verapamil (CALAN-SR) 240 MG CR tablet Take 240 mg by mouth daily.     No current facility-administered medications on file prior to visit.     ONCOLOGIC FAMILY HISTORY:  Family History  Problem Relation Age of Onset  . Colon cancer Sister   . Lung cancer Sister      GENETIC COUNSELING/TESTING: No    SOCIAL HISTORY:  SAHIRA CATALDI is separated and lives alone in [place], [place].  She has 1 child. Ms. Sundeen is currently retired.  She is a current smoker, approximately 1 PPD, and denies any current or  history of alcohol, or illicit drug use.     PHYSICAL EXAMINATION:  Vital Signs: Filed Vitals:   02/12/16 0909  BP: 182/84  Pulse: 84  Temp: 98.2 F (36.8 C)  Resp: 18   ECOG Performance Status: 0  General: Well-nourished, well-appearing female in no acute distress.  She is unaccompanied in clinic today.   HEENT: Head is atraumatic and normocephalic.  Pupils equal and reactive to light and accomodation. Conjunctivae clear without exudate.  Sclerae anicteric. Oral mucosa is pink, moist, and intact without lesions.  Oropharynx is pink without lesions or erythema.  Lymph: No cervical, supraclavicular, infraclavicular, or axillary lymphadenopathy noted on palpation.  Cardiovascular: Regular rate and rhythm without murmurs, rubs, or gallops. Respiratory: Clear to auscultation bilaterally. Chest expansion symmetric without accessory muscle use on inspiration or expiration.  GI: Abdomen soft and round. No tenderness  to palpation. Bowel sounds normoactive in 4 quadrants. GU: Deferred.  Musculoskeletal: Muscle strength 5/5 in all extremities.   Neuro: No focal deficits. Steady gait.  Psych: Mood and affect normal and appropriate for situation.  Extremities: No edema, cyanosis, or clubbing.  Skin: Warm and dry. No open lesions noted.   LABORATORY DATA:  None for this visit.  DIAGNOSTIC IMAGING:  None for this visit.     ASSESSMENT AND PLAN:   1. Breast cancer: Stage IIA invasive ductal carcinoma with ductal carcinoma in situ of the left breast (08/2015), ER positive, PR positive, HER2/neu negative, S/P lumpectomy (09/2015) followed by adjuvant radiation therapy (completed 12/2015) followed by adjuvant endocrine therapy with anastrozole initiated 12/2015.  Ms. Silveria is doing well without clinical symptoms worrisome for disease recurrence. She will follow-up with her medical oncologist,  Dr. Lindi Adie, in June 2017 with history and physical examination per surveillance protocol.  She will continue  her anti-estrogen therapy with anastrozole at this time and was instructed to report any new or increased side effects of the medication. A comprehensive survivorship care plan and treatment summary was reviewed with the patient today detailing her breast cancer diagnosis, treatment course, potential late/long-term effects of treatment, appropriate follow-up care with recommendations for the future, and patient education resources.  A copy of this summary, along with a letter will be sent to the patient's primary care provider via in basket message after today's visit.  Ms. Amaral is welcome to return to the Survivorship Clinic in the future, as needed; no follow-up will be scheduled at this time.    2.Elevated blood pressure: Ms. Mcguirt reports that she did not take her antihypertensives this morning and that her blood pressure is always elevated at new physician visits.  She denies headache, visual changes or other symptoms.  We discussed using catapres to help decrease her blood pressure in office today, but patient refuses saying she wants to go home and will take her medication.  Pt reports that she does have blood pressure cuff at home.  I asked her to check in one hour after she takes her medication and call us to notify us of the reading.  I also instructed her to call Dr. Harrington Challenger to arrange sooner follow up than currently scheduled visit in August.  Pt states that she will.   3. Bone health:  Given Ms. Marques's age/history of breast cancer and her current treatment regimen including endocrine therapy with anastrozole, she is at risk for bone demineralization.  Per our records, her last DEXA scan was performed 07/2015 revealing osteopenia. We will continue to monitor this closely while she is on endocrine therapy.  In the meantime, she was encouraged to increase her consumption of foods rich in calcium and vitamin D as well as to increase her weight-bearing activities.  She was given education on specific  activities to promote bone health.  4. Cancer screening:  Due to Ms. Cypress's history and her age, she should receive screening for skin cancers, colon cancer, and gynecologic cancers.  The information and recommendations are listed on the patient's comprehensive care plan/treatment summary and were reviewed in detail with the patient.    5. Health maintenance and wellness promotion: Ms. Delgreco was encouraged to consume 5-7 servings of fruits and vegetables per day. We reviewed the "Nutrition Rainbow" handout, as well as discussed recommendations to maximize nutrition and minimize recurrence, such as increased intake of fruits, vegetables, lean proteins, and minimizing the intake of red meats and processed foods.  She was also encouraged to engage in moderate to vigorous exercise for 30 minutes per day most days of the week. We discussed the [employer] fitness program, which is designed for cancer survivors to help them become more physically fit after cancer treatments.  She was instructed to limit her alcohol consumption and was strongly encouraged to stop smoking.  A copy of the "Take Control of Your Health" brochure was given to her reinforcing these recommendations.   6. Support services/counseling: It is not uncommon for this period of the patient's cancer care trajectory to be one of many emotions and stressors. We discussed an opportunity for her to participate in the next session of [facility] ("Finding Your New Normal") support group series designed for patients after they have completed treatment.  Ms. Bently was encouraged to take advantage of our many other support services programs, support groups, and/or counseling in coping with her new life as a cancer survivor after completing anti-cancer treatment. She was offered support today through active listening and expressive supportive counseling.  She was given information regarding our available services and encouraged to contact me with any questions or  for help enrolling in any of our support group/programs.    A total of 50 minutes of face-to-face time was spent with this patient with greater than 50% of that time in counseling and care-coordination.   Sylvan Cheese, NP  Survivorship [facility] [place] [phone]   Note: PRIMARY CARE PROVIDER  Melinda Crutch, [place] [phone]

## 2016-02-13 ENCOUNTER — Telehealth: Payer: Self-pay | Admitting: Nurse Practitioner

## 2016-02-13 NOTE — Telephone Encounter (Signed)
Spoke with patient who did return home and took blood pressure medication.  Pt reports that she was unable to find blood pressure cuff.  Asked patient to call Dr. Harrington Challenger' office to schedule follow up for evaluation of blood pressure.  Pt states that she will do so.  Remains asymptomatic.  No questions at this time.  Pt will call Dr. Harrington Challenger' office.

## 2016-02-13 NOTE — Telephone Encounter (Signed)
Received call from Dawn [name] reporting blood pressure today was 137/78.  Will continue to monitor and follow up with Dr. Harrington Challenger.  No questions at this time.

## 2016-03-11 ENCOUNTER — Telehealth: Payer: Self-pay | Admitting: Hematology and Oncology

## 2016-03-11 ENCOUNTER — Encounter: Payer: Self-pay | Admitting: Hematology and Oncology

## 2016-03-11 ENCOUNTER — Ambulatory Visit (HOSPITAL_BASED_OUTPATIENT_CLINIC_OR_DEPARTMENT_OTHER): Payer: Medicare Other | Admitting: Hematology and Oncology

## 2016-03-11 VITALS — BP 181/79 | HR 75 | Temp 98.2°F | Ht 64.0 in | Wt 138.1 lb

## 2016-03-11 DIAGNOSIS — Z720 Tobacco use: Secondary | ICD-10-CM | POA: Diagnosis not present

## 2016-03-11 DIAGNOSIS — C50412 Malignant neoplasm of upper-outer quadrant of left female breast: Secondary | ICD-10-CM

## 2016-03-11 DIAGNOSIS — Z79811 Long term (current) use of aromatase inhibitors: Secondary | ICD-10-CM

## 2016-03-11 NOTE — Assessment & Plan Note (Signed)
Left breast lumpectomy 09/17/2015: Invasive ductal carcinoma with calcifications, grade 1, 2.5 cm, low-grade DCIS, LVI present, 0/1 lymph nodes negative, T2 N0 stage II a, ER 100%, PR 20%, HER-2 negative, Ki-67 20% Oncotype DX score 9, 7% risk of recurrence Adjuvant radiation therapy from 11/18/2015 to 12/16/2015  Recommendations: Adjuvant antiestrogen therapy with anastrozole 1 mg daily 5 years started 12/19/2015  Anastrozole toxicities:  Tobacco abuse: She is not prepared to quit smoking. She was offered counseling. She was not interested.  Return to clinic in 6 months for follow-up

## 2016-03-11 NOTE — Telephone Encounter (Signed)
appt made and avs printed °

## 2016-03-11 NOTE — Progress Notes (Signed)
Patient Care Team: Lona Kettle, MD as PCP - General (Family Medicine) Excell Seltzer, MD as Consulting Physician (General Surgery) Nicholas Lose, MD as Consulting Physician (Hematology and Oncology) Arloa Koh, MD as Consulting Physician (Radiation Oncology) Sylvan Cheese, NP as Nurse Practitioner (Hematology and Oncology) Eppie Gibson, MD as Attending Physician (Radiation Oncology)  DIAGNOSIS: Breast cancer of upper-outer quadrant of left female breast [facility])   Staging form: Breast, AJCC 7th Edition     Clinical stage from 08/20/2015: Stage IIA (T2, N0, M0) - Unsigned     Pathologic stage from 09/17/2015: Stage IIA (T2, N0, cM0) - Unsigned   SUMMARY OF ONCOLOGIC HISTORY:   Breast cancer of upper-outer quadrant of left female breast ([place])   08/14/2015 Mammogram left breast focal asymmetry, ultrasound revealed 2.5 cm lesion. Because of breast pain, patient refuses diagnostic mammograms.   08/14/2015 Initial Diagnosis left breast biopsy: Invasive ductal carcinoma with DCIS grade 1-2, ER 100%, PR 20%, Ki-67 20%, HER-2 negative ratio 1.77   08/14/2015 Clinical Stage Stage IIA: T2 N0   09/17/2015 Surgery left breast lumpectomy: Invasive ductal carcinoma with calcifications, grade 1, 2.5 cm, low-grade DCIS, LVI present, 0/1 lymph nodes negative, ER 100%, PR 20%, HER-2 negative, Ki-67 20%   09/17/2015 Pathologic Stage Stage IIA: T2 N0   11/18/2015 - 12/16/2015 Radiation Therapy Adjuvant radiation therapy: Left breast treated to 40.05 Gy in 15 fractions, Left breast boost treated to 10 Gy in 5 fractions   12/19/2015 -  Anti-estrogen oral therapy Anastrozole 1 mg daily 5 years   02/12/2016 Survivorship Survivorship visit completed and copy given to patient    CHIEF COMPLIANT: Follow-up on anastrozole  INTERVAL HISTORY: Dawn [name] is a 75 year old with above-mentioned history of left breast cancer treated with lumpectomy radiation and is currently on anastrozole therapy. She is here for  three-month follow-up on anastrozole. She reports no new problems or concerns. She denies any hot flashes or myalgias. She continues to smoke cigarettes and is not planning on quitting anytime so.  REVIEW OF SYSTEMS:   Constitutional: Denies fevers, chills or abnormal weight loss Eyes: Denies blurriness of vision Ears, nose, mouth, throat, and face: Denies mucositis or sore throat Respiratory: Denies cough, dyspnea or wheezes Cardiovascular: Denies palpitation, chest discomfort Gastrointestinal:  Denies nausea, heartburn or change in bowel habits Skin: Denies abnormal skin rashes Lymphatics: Denies new lymphadenopathy or easy bruising Neurological:Denies numbness, tingling or new weaknesses Behavioral/Psych: Mood is stable, no new changes  Extremities: No lower extremity edema Breast:  denies any pain or lumps or nodules in either breasts All other systems were reviewed with the patient and are negative.  I have reviewed the past medical history, past surgical history, social history and family history with the patient and they are unchanged from previous note.  ALLERGIES:  is allergic to pneumococcal vaccines and prandin.  MEDICATIONS:  Current Outpatient Prescriptions  Medication Sig Dispense Refill  . acarbose (PRECOSE) 25 MG tablet Take 25 mg by mouth 3 (three) times daily with meals.     Marland Kitchen anastrozole (ARIMIDEX) 1 MG tablet Take 1 tablet (1 mg total) by mouth daily. 90 tablet 3  . aspirin EC 81 MG tablet Take 81 mg by mouth daily.    Marland Kitchen buPROPion (WELLBUTRIN XL) 300 MG 24 hr tablet Take 300 mg by mouth daily.    . Cholecalciferol (VITAMIN D3) 5000 UNITS TABS Take 5,000 Units by mouth daily.    Marland Kitchen glipiZIDE (GLUCOTROL XL) 2.5 MG 24 hr tablet Take 2.5 mg by  mouth daily with breakfast.    . metFORMIN (GLUCOPHAGE-XR) 500 MG 24 hr tablet Take 1,000 mg by mouth 2 (two) times daily.    . pravastatin (PRAVACHOL) 40 MG tablet Take 40 mg by mouth daily.    . quinapril (ACCUPRIL) 40 MG tablet  Take 40 mg by mouth daily.     . verapamil (CALAN-SR) 240 MG CR tablet Take 240 mg by mouth daily.     No current facility-administered medications for this visit.    PHYSICAL EXAMINATION: ECOG PERFORMANCE STATUS: 0 - Asymptomatic  Filed Vitals:   03/11/16 1020  BP: 181/79  Pulse: 75  Temp: 98.2 F (36.8 C)   Filed Weights   03/11/16 1020  Weight: 138 lb 1.6 oz (62.642 kg)    GENERAL:alert, no distress and comfortable SKIN: skin color, texture, turgor are normal, no rashes or significant lesions EYES: normal, Conjunctiva are pink and non-injected, sclera clear OROPHARYNX:no exudate, no erythema and lips, buccal mucosa, and tongue normal  NECK: supple, thyroid normal size, non-tender, without nodularity LYMPH:  no palpable lymphadenopathy in the cervical, axillary or inguinal LUNGS: clear to auscultation and percussion with normal breathing effort HEART: regular rate & rhythm and no murmurs and no lower extremity edema ABDOMEN:abdomen soft, non-tender and normal bowel sounds MUSCULOSKELETAL:no cyanosis of digits and no clubbing  NEURO: alert & oriented x 3 with fluent speech, no focal motor/sensory deficits EXTREMITIES: No lower extremity edema  LABORATORY DATA:  I have reviewed the data as listed   Chemistry      Component Value Date/Time   NA 135 09/09/2015 0904   NA 137 08/20/2015 1232   K 4.6 09/09/2015 0904   K 4.3 08/20/2015 1232   CL 99* 09/09/2015 0904   CO2 25 09/09/2015 0904   CO2 25 08/20/2015 1232   BUN 6 09/09/2015 0904   BUN 10.4 08/20/2015 1232   CREATININE 0.81 09/09/2015 0904   CREATININE 1.1 08/20/2015 1232      Component Value Date/Time   CALCIUM 9.4 09/09/2015 0904   CALCIUM 10.0 08/20/2015 1232   ALKPHOS 142 08/20/2015 1232   AST 18 08/20/2015 1232   ALT 11 08/20/2015 1232   BILITOT 0.32 08/20/2015 1232       Lab Results  Component Value Date   WBC 6.4 09/09/2015   HGB 14.4 09/09/2015   HCT 43.9 09/09/2015   MCV 89.0 09/09/2015    PLT 317 09/09/2015   NEUTROABS 3.3 08/20/2015     ASSESSMENT & PLAN:  Breast cancer of upper-outer quadrant of left female breast ([place]) Left breast lumpectomy 09/17/2015: Invasive ductal carcinoma with calcifications, grade 1, 2.5 cm, low-grade DCIS, LVI present, 0/1 lymph nodes negative, T2 N0 stage II a, ER 100%, PR 20%, HER-2 negative, Ki-67 20% Oncotype DX score 9, 7% risk of recurrence Adjuvant radiation therapy from 11/18/2015 to 12/16/2015  Recommendations: Adjuvant antiestrogen therapy with anastrozole 1 mg daily 5 years started 12/19/2015  Anastrozole toxicities: Denies any hot flashes or myalgias. Tolerating it extremely well.  Tobacco abuse: She is not prepared to quit smoking. She was offered counseling. She was not interested.  Return to clinic in 6 months for follow-up   No orders of the defined types were placed in this encounter.   The patient has a good understanding of the overall plan. she agrees with it. she will call with any problems that may develop before the next visit here.   Rulon Eisenmenger, MD 03/11/2016

## 2016-08-15 ENCOUNTER — Telehealth: Payer: Self-pay | Admitting: Hematology and Oncology

## 2016-08-15 NOTE — Telephone Encounter (Signed)
Called pt to advise of appt chg from 12/29 to 10/08/16 @ 8.15am but pt has no vm. Mailed new appt for 10/08/16.

## 2016-09-10 ENCOUNTER — Ambulatory Visit: Payer: Medicare Other | Admitting: Hematology and Oncology

## 2016-10-07 NOTE — Assessment & Plan Note (Signed)
Left breast lumpectomy 09/17/2015: Invasive ductal carcinoma with calcifications, grade 1, 2.5 cm, low-grade DCIS, LVI present, 0/1 lymph nodes negative, T2 N0 stage II a, ER 100%, PR 20%, HER-2 negative, Ki-67 20% Oncotype DX score 9, 7% risk of recurrence Adjuvant radiation therapy from 11/18/2015 to 12/16/2015  Recommendations: Adjuvant antiestrogen therapy with anastrozole 1 mg daily 5 years started 12/19/2015  Anastrozole toxicities: Denies any hot flashes or myalgias. Tolerating it extremely well.  Tobacco abuse: She is not prepared to quit smoking. She was offered counseling. She was not interested.  Return to clinic in 1 yr for follow-up

## 2016-10-08 ENCOUNTER — Ambulatory Visit (HOSPITAL_BASED_OUTPATIENT_CLINIC_OR_DEPARTMENT_OTHER): Payer: Medicare Other | Admitting: Hematology and Oncology

## 2016-10-08 VITALS — BP 186/75 | HR 87 | Temp 98.1°F | Resp 18 | Wt 131.8 lb

## 2016-10-08 DIAGNOSIS — Z79811 Long term (current) use of aromatase inhibitors: Secondary | ICD-10-CM

## 2016-10-08 DIAGNOSIS — C50412 Malignant neoplasm of upper-outer quadrant of left female breast: Secondary | ICD-10-CM

## 2016-10-08 DIAGNOSIS — Z170 Estrogen receptor positive status [ER+]: Secondary | ICD-10-CM | POA: Diagnosis not present

## 2016-10-08 DIAGNOSIS — I10 Essential (primary) hypertension: Secondary | ICD-10-CM

## 2016-10-08 NOTE — Progress Notes (Signed)
Patient Care Team: Lawerance Cruel, MD as PCP - General (Family Medicine) Excell Seltzer, MD as Consulting Physician (General Surgery) Nicholas Lose, MD as Consulting Physician (Hematology and Oncology) Arloa Koh, MD as Consulting Physician (Radiation Oncology) Sylvan Cheese, NP as Nurse Practitioner (Hematology and Oncology) Eppie Gibson, MD as Attending Physician (Radiation Oncology)  DIAGNOSIS:  Encounter Diagnosis  Name Primary?  . Malignant neoplasm of upper-outer quadrant of left breast in female, estrogen receptor positive ([place])     SUMMARY OF ONCOLOGIC HISTORY:   Breast cancer of upper-outer quadrant of left female breast ([place])   08/14/2015 Mammogram    left breast focal asymmetry, ultrasound revealed 2.5 cm lesion. Because of breast pain, patient refuses diagnostic mammograms.      08/14/2015 Initial Diagnosis    left breast biopsy: Invasive ductal carcinoma with DCIS grade 1-2, ER 100%, PR 20%, Ki-67 20%, HER-2 negative ratio 1.77      08/14/2015 Clinical Stage    Stage IIA: T2 N0      09/17/2015 Surgery    left breast lumpectomy: Invasive ductal carcinoma with calcifications, grade 1, 2.5 cm, low-grade DCIS, LVI present, 0/1 lymph nodes negative, ER 100%, PR 20%, HER-2 negative, Ki-67 20%      09/17/2015 Pathologic Stage    Stage IIA: T2 N0      11/18/2015 - 12/16/2015 Radiation Therapy    Adjuvant radiation therapy: Left breast treated to 40.05 Gy in 15 fractions, Left breast boost treated to 10 Gy in 5 fractions      12/19/2015 -  Anti-estrogen oral therapy    Anastrozole 1 mg daily 5 years      02/12/2016 Survivorship    Survivorship visit completed and copy given to patient       CHIEF COMPLIANT: Follow-up on anastrozole  INTERVAL HISTORY: Dawn [name] is a 76 year old who is currently on antiestrogen therapy with anastrozole. She is tolerating it extremely well. She denies any major hot flashes or night sweats or myalgias. She denies any  lumps or nodules in the breast. She is here for surveillance exam and follow-up.  REVIEW OF SYSTEMS:   Constitutional: Denies fevers, chills or abnormal weight loss Eyes: Denies blurriness of vision Ears, nose, mouth, throat, and face: Denies mucositis or sore throat Respiratory: Denies cough, dyspnea or wheezes Cardiovascular: Denies palpitation, chest discomfort Gastrointestinal:  Denies nausea, heartburn or change in bowel habits Skin: Denies abnormal skin rashes Lymphatics: Denies new lymphadenopathy or easy bruising Neurological:Denies numbness, tingling or new weaknesses Behavioral/Psych: Mood is stable, no new changes  Extremities: No lower extremity edema Breast:  denies any pain or lumps or nodules in either breasts All other systems were reviewed with the patient and are negative.  I have reviewed the past medical history, past surgical history, social history and family history with the patient and they are unchanged from previous note.  ALLERGIES:  is allergic to pneumococcal vaccines and prandin [repaglinide].  MEDICATIONS:  Current Outpatient Prescriptions  Medication Sig Dispense Refill  . acarbose (PRECOSE) 25 MG tablet Take 25 mg by mouth 3 (three) times daily with meals.     Marland Kitchen anastrozole (ARIMIDEX) 1 MG tablet Take 1 tablet (1 mg total) by mouth daily. 90 tablet 3  . aspirin EC 81 MG tablet Take 81 mg by mouth daily.    Marland Kitchen buPROPion (WELLBUTRIN XL) 300 MG 24 hr tablet Take 300 mg by mouth daily.    . Cholecalciferol (VITAMIN D3) 5000 UNITS TABS Take 5,000 Units by mouth daily.    Marland Kitchen  glipiZIDE (GLUCOTROL XL) 2.5 MG 24 hr tablet Take 2.5 mg by mouth daily with breakfast.    . metFORMIN (GLUCOPHAGE-XR) 500 MG 24 hr tablet Take 1,000 mg by mouth 2 (two) times daily.    . pravastatin (PRAVACHOL) 40 MG tablet Take 40 mg by mouth daily.    . quinapril (ACCUPRIL) 40 MG tablet Take 40 mg by mouth daily.     . verapamil (CALAN-SR) 240 MG CR tablet Take 240 mg by mouth daily.      No current facility-administered medications for this visit.     PHYSICAL EXAMINATION: ECOG PERFORMANCE STATUS: 1 - Symptomatic but completely ambulatory  Vitals:   10/08/16 0814  BP: (!) 186/75  Pulse: 87  Resp: 18  Temp: 98.1 F (36.7 C)   Filed Weights   10/08/16 0814  Weight: 131 lb 12.8 oz (59.8 kg)    GENERAL:alert, no distress and comfortable SKIN: skin color, texture, turgor are normal, no rashes or significant lesions EYES: normal, Conjunctiva are pink and non-injected, sclera clear OROPHARYNX:no exudate, no erythema and lips, buccal mucosa, and tongue normal  NECK: supple, thyroid normal size, non-tender, without nodularity LYMPH:  no palpable lymphadenopathy in the cervical, axillary or inguinal LUNGS: Bilateral diminished breath sounds with expiratory wheeze HEART: regular rate & rhythm and no murmurs and no lower extremity edema ABDOMEN:abdomen soft, non-tender and normal bowel sounds MUSCULOSKELETAL:no cyanosis of digits and no clubbing  NEURO: alert & oriented x 3 with fluent speech, no focal motor/sensory deficits EXTREMITIES: No lower extremity edema  LABORATORY DATA:  I have reviewed the data as listed   Chemistry      Component Value Date/Time   NA 135 09/09/2015 0904   NA 137 08/20/2015 1232   K 4.6 09/09/2015 0904   K 4.3 08/20/2015 1232   CL 99 (L) 09/09/2015 0904   CO2 25 09/09/2015 0904   CO2 25 08/20/2015 1232   BUN 6 09/09/2015 0904   BUN 10.4 08/20/2015 1232   CREATININE 0.81 09/09/2015 0904   CREATININE 1.1 08/20/2015 1232      Component Value Date/Time   CALCIUM 9.4 09/09/2015 0904   CALCIUM 10.0 08/20/2015 1232   ALKPHOS 142 08/20/2015 1232   AST 18 08/20/2015 1232   ALT 11 08/20/2015 1232   BILITOT 0.32 08/20/2015 1232       Lab Results  Component Value Date   WBC 6.4 09/09/2015   HGB 14.4 09/09/2015   HCT 43.9 09/09/2015   MCV 89.0 09/09/2015   PLT 317 09/09/2015   NEUTROABS 3.3 08/20/2015    ASSESSMENT & PLAN:    Breast cancer of upper-outer quadrant of left female breast ([place]) Left breast lumpectomy 09/17/2015: Invasive ductal carcinoma with calcifications, grade 1, 2.5 cm, low-grade DCIS, LVI present, 0/1 lymph nodes negative, T2 N0 stage II a, ER 100%, PR 20%, HER-2 negative, Ki-67 20% Oncotype DX score 9, 7% risk of recurrence Adjuvant radiation therapy from 11/18/2015 to 12/16/2015  Current treatment: Adjuvant antiestrogen therapy with anastrozole 1 mg daily 5 years started 12/19/2015  Anastrozole toxicities: Denies any hot flashes or myalgias. Tolerating it extremely well.  Tobacco abuse: Once again discussed smoking cessation but she is not interested in this.  Breast Cancer Surveillance: 1. Breast exam 10/08/2016: Normal 2. Mammogram done annually at [facility]: Last mammogram was done in January 2017. We will send another request for another mammogram to be done by [name]. Discussed the differences between different breast density categories.  Hypertension: Blood pressure is been running very high.  Patient will be calling her primary care physician to discuss this. We will inform patient's primary care physician Dr. Harrington Challenger about her persistent hypertension.  Return to clinic in 1 yr for follow-up  I spent 25 minutes talking to the patient of which more than half was spent in counseling and coordination of care.  No orders of the defined types were placed in this encounter.  The patient has a good understanding of the overall plan. she agrees with it. she will call with any problems that may develop before the next visit here.   Rulon Eisenmenger, MD 10/08/16

## 2016-12-13 ENCOUNTER — Telehealth: Payer: Self-pay

## 2016-12-13 MED ORDER — ANASTROZOLE 1 MG PO TABS: 1.0000 mg | ORAL_TABLET | Freq: Every day | ORAL | 3 refills | Status: DC

## 2016-12-13 NOTE — Telephone Encounter (Signed)
Pt called for anastrozole refill. Done per protocol.

## 2017-10-09 NOTE — Assessment & Plan Note (Signed)
Left breast lumpectomy 09/17/2015: Invasive ductal carcinoma with calcifications, grade 1, 2.5 cm, low-grade DCIS, LVI present, 0/1 lymph nodes negative, T2 N0 stage II a, ER 100%, PR 20%, HER-2 negative, Ki-67 20% Oncotype DX score 9, 7% risk of recurrence Adjuvant radiation therapy from 11/18/2015 to 12/16/2015  Current treatment: Adjuvant antiestrogen therapy with anastrozole 1 mg daily 5 years started 12/19/2015  Anastrozole toxicities: Denies any hot flashes or myalgias. Tolerating it extremely well.  Tobacco abuse: Once again discussed smoking cessation but she is not interested in this.  Breast Cancer Surveillance: 1. Breast exam 10/08/2016: Normal 2. Mammogram  [facility]: Last mammogram was done in January 2017.  Hypertension:   Return to clinic in 1 yr for follow-up

## 2017-10-10 ENCOUNTER — Telehealth: Payer: Self-pay | Admitting: Hematology and Oncology

## 2017-10-10 ENCOUNTER — Inpatient Hospital Stay: Payer: Medicare Other | Attending: Hematology and Oncology | Admitting: Hematology and Oncology

## 2017-10-10 DIAGNOSIS — F17210 Nicotine dependence, cigarettes, uncomplicated: Secondary | ICD-10-CM | POA: Insufficient documentation

## 2017-10-10 DIAGNOSIS — Z170 Estrogen receptor positive status [ER+]: Secondary | ICD-10-CM | POA: Insufficient documentation

## 2017-10-10 DIAGNOSIS — Z79811 Long term (current) use of aromatase inhibitors: Secondary | ICD-10-CM | POA: Diagnosis not present

## 2017-10-10 DIAGNOSIS — Z923 Personal history of irradiation: Secondary | ICD-10-CM

## 2017-10-10 DIAGNOSIS — I10 Essential (primary) hypertension: Secondary | ICD-10-CM | POA: Insufficient documentation

## 2017-10-10 DIAGNOSIS — C50412 Malignant neoplasm of upper-outer quadrant of left female breast: Secondary | ICD-10-CM | POA: Diagnosis not present

## 2017-10-10 MED ORDER — ANASTROZOLE 1 MG PO TABS: 1.0000 mg | ORAL_TABLET | Freq: Every day | ORAL | 3 refills | Status: DC

## 2017-10-10 NOTE — Telephone Encounter (Signed)
Gave patient avs and calendar with appts per 1/28 los.

## 2017-10-10 NOTE — Progress Notes (Signed)
Patient Care Team: Lawerance Cruel, MD as PCP - General (Family Medicine) Excell Seltzer, MD as Consulting Physician (General Surgery) Nicholas Lose, MD as Consulting Physician (Hematology and Oncology) Arloa Koh, MD as Consulting Physician (Radiation Oncology) Sylvan Cheese, NP as Nurse Practitioner (Hematology and Oncology) Eppie Gibson, MD as Attending Physician (Radiation Oncology)  DIAGNOSIS:  Encounter Diagnosis  Name Primary?  . Malignant neoplasm of upper-outer quadrant of left breast in female, estrogen receptor positive ([place])     SUMMARY OF ONCOLOGIC HISTORY:   Breast cancer of upper-outer quadrant of left female breast ([place])   08/14/2015 Mammogram    left breast focal asymmetry, ultrasound revealed 2.5 cm lesion. Because of breast pain, patient refuses diagnostic mammograms.      08/14/2015 Initial Diagnosis    left breast biopsy: Invasive ductal carcinoma with DCIS grade 1-2, ER 100%, PR 20%, Ki-67 20%, HER-2 negative ratio 1.77      08/14/2015 Clinical Stage    Stage IIA: T2 N0      09/17/2015 Surgery    left breast lumpectomy: Invasive ductal carcinoma with calcifications, grade 1, 2.5 cm, low-grade DCIS, LVI present, 0/1 lymph nodes negative, ER 100%, PR 20%, HER-2 negative, Ki-67 20%      09/17/2015 Pathologic Stage    Stage IIA: T2 N0      11/18/2015 - 12/16/2015 Radiation Therapy    Adjuvant radiation therapy: Left breast treated to 40.05 Gy in 15 fractions, Left breast boost treated to 10 Gy in 5 fractions      12/19/2015 -  Anti-estrogen oral therapy    Anastrozole 1 mg daily 5 years      02/12/2016 Survivorship    Survivorship visit completed and copy given to patient       CHIEF COMPLIANT: Follow-up on anastrozole therapy  INTERVAL HISTORY: Dawn [name] is a 77 year old with above-mentioned history left breast cancer treated with lumpectomy followed by adjuvant radiation is currently on anastrozole.  She is tolerating anastrozole  extremely well.  She does not have any hot flashes or myalgias.  She continues to smoke cigarettes.  Denies any lumps or nodules in the breast.  She does not want to get mammograms because her breasts are painful.  She fully understands that without mammogram surveillance would be very difficult.  REVIEW OF SYSTEMS:   Constitutional: Denies fevers, chills or abnormal weight loss Eyes: Denies blurriness of vision Ears, nose, mouth, throat, and face: Denies mucositis or sore throat Respiratory: Shortness of breath to exertion probably from COPD/smoking Cardiovascular: Denies palpitation, chest discomfort Gastrointestinal:  Denies nausea, heartburn or change in bowel habits Skin: Denies abnormal skin rashes Lymphatics: Denies new lymphadenopathy or easy bruising Neurological:Denies numbness, tingling or new weaknesses Behavioral/Psych: Mood is stable, no new changes  Extremities: No lower extremity edema Breast: Bilateral breast tenderness which is now chronic All other systems were reviewed with the patient and are negative.  I have reviewed the past medical history, past surgical history, social history and family history with the patient and they are unchanged from previous note.  ALLERGIES:  is allergic to pneumococcal vaccines and prandin [repaglinide].  MEDICATIONS:  Current Outpatient Medications  Medication Sig Dispense Refill  . acarbose (PRECOSE) 25 MG tablet Take 25 mg by mouth 3 (three) times daily with meals.     Marland Kitchen anastrozole (ARIMIDEX) 1 MG tablet Take 1 tablet (1 mg total) by mouth daily. 90 tablet 3  . aspirin EC 81 MG tablet Take 81 mg by mouth daily.    Marland Kitchen  buPROPion (WELLBUTRIN XL) 300 MG 24 hr tablet Take 300 mg by mouth daily.    . Cholecalciferol (VITAMIN D3) 5000 UNITS TABS Take 5,000 Units by mouth daily.    Marland Kitchen glipiZIDE (GLUCOTROL XL) 2.5 MG 24 hr tablet Take 2.5 mg by mouth daily with breakfast.    . metFORMIN (GLUCOPHAGE-XR) 500 MG 24 hr tablet Take 1,000 mg by  mouth 2 (two) times daily.    . pravastatin (PRAVACHOL) 40 MG tablet Take 40 mg by mouth daily.    . quinapril (ACCUPRIL) 40 MG tablet Take 40 mg by mouth daily.     . verapamil (CALAN-SR) 240 MG CR tablet Take 240 mg by mouth daily.     No current facility-administered medications for this visit.     PHYSICAL EXAMINATION: ECOG PERFORMANCE STATUS: 1 - Symptomatic but completely ambulatory  Vitals:   10/10/17 0828  BP: (!) 173/67  Pulse: 82  Resp: 18  Temp: 98.4 F (36.9 C)  SpO2: 92%   Filed Weights   10/10/17 0828  Weight: 140 lb 8 oz (63.7 kg)    GENERAL:alert, no distress and comfortable SKIN: skin color, texture, turgor are normal, no rashes or significant lesions EYES: normal, Conjunctiva are pink and non-injected, sclera clear OROPHARYNX:no exudate, no erythema and lips, buccal mucosa, and tongue normal  NECK: supple, thyroid normal size, non-tender, without nodularity LYMPH:  no palpable lymphadenopathy in the cervical, axillary or inguinal LUNGS: clear to auscultation and percussion with normal breathing effort HEART: regular rate & rhythm and no murmurs and no lower extremity edema ABDOMEN:abdomen soft, non-tender and normal bowel sounds MUSCULOSKELETAL:no cyanosis of digits and no clubbing  NEURO: alert & oriented x 3 with fluent speech, no focal motor/sensory deficits EXTREMITIES: No lower extremity edema BREAST: No palpable masses or nodules in either right or left breasts. No palpable axillary supraclavicular or infraclavicular adenopathy no breast tenderness or nipple discharge. (exam performed in the presence of a chaperone)  LABORATORY DATA:  I have reviewed the data as listed CMP Latest Ref Rng & Units 09/09/2015 08/20/2015 11/28/2009  Glucose 65 - 99 mg/dL 266(H) 225(H) 99  BUN 6 - 20 mg/dL 6 10.4 10  Creatinine 0.44 - 1.00 mg/dL 0.81 1.1 0.71  Sodium 135 - 145 mmol/L 135 137 140  Potassium 3.5 - 5.1 mmol/L 4.6 4.3 4.5  Chloride 101 - 111 mmol/L 99(L) -  97  CO2 22 - 32 mmol/L 25 25 38(H)  Calcium 8.9 - 10.3 mg/dL 9.4 10.0 9.2  Total Protein 6.4 - 8.3 g/dL - 7.1 -  Total Bilirubin 0.20 - 1.20 mg/dL - 0.32 -  Alkaline Phos 40 - 150 U/L - 142 -  AST 5 - 34 U/L - 18 -  ALT 0 - 55 U/L - 11 -    Lab Results  Component Value Date   WBC 6.4 09/09/2015   HGB 14.4 09/09/2015   HCT 43.9 09/09/2015   MCV 89.0 09/09/2015   PLT 317 09/09/2015   NEUTROABS 3.3 08/20/2015    ASSESSMENT & PLAN:  Breast cancer of upper-outer quadrant of left female breast ([place]) Left breast lumpectomy 09/17/2015: Invasive ductal carcinoma with calcifications, grade 1, 2.5 cm, low-grade DCIS, LVI present, 0/1 lymph nodes negative, T2 N0 stage II a, ER 100%, PR 20%, HER-2 negative, Ki-67 20% Oncotype DX score 9, 7% risk of recurrence Adjuvant radiation therapy from 11/18/2015 to 12/16/2015  Current treatment: Adjuvant antiestrogen therapy with anastrozole 1 mg daily 5 years started 12/19/2015  Anastrozole toxicities: Denies any  hot flashes or myalgias. Tolerating it extremely well.  Tobacco abuse: Once again discussed smoking cessation but she is not interested in this.  She did however decrease the number of cigarettes she smokes.  Breast Cancer Surveillance: 1. Breast exam 10/08/2016: Normal 2. Mammogram  [facility]: Patient does not want to get any more mammograms because her breast are hurting   hypertension: Patient follows with her PCP for this.  She tells me that at home her blood pressures are normal.  Return to clinic in 1 yr for follow-up  I spent 25 minutes talking to the patient of which more than half was spent in counseling and coordination of care.  No orders of the defined types were placed in this encounter.  The patient has a good understanding of the overall plan. she agrees with it. she will call with any problems that may develop before the next visit here.   Harriette Ohara, MD 10/10/17

## 2018-07-25 ENCOUNTER — Other Ambulatory Visit: Payer: Self-pay

## 2018-07-25 DIAGNOSIS — I739 Peripheral vascular disease, unspecified: Secondary | ICD-10-CM

## 2018-08-29 ENCOUNTER — Other Ambulatory Visit: Payer: Self-pay | Admitting: Family Medicine

## 2018-09-21 ENCOUNTER — Encounter: Payer: Self-pay | Admitting: Vascular Surgery

## 2018-09-21 ENCOUNTER — Ambulatory Visit (HOSPITAL_COMMUNITY)
Admission: RE | Admit: 2018-09-21 | Discharge: 2018-09-21 | Disposition: A | Discharge status: Home / Self Care | Payer: Medicare Other | Source: Ambulatory Visit | Attending: Vascular Surgery | Admitting: Vascular Surgery

## 2018-09-21 ENCOUNTER — Other Ambulatory Visit: Payer: Self-pay

## 2018-09-21 ENCOUNTER — Ambulatory Visit (INDEPENDENT_AMBULATORY_CARE_PROVIDER_SITE_OTHER): Payer: Medicare Other | Admitting: Vascular Surgery

## 2018-09-21 VITALS — BP 187/84 | HR 92 | Temp 97.4°F | Resp 18 | Ht 64.0 in | Wt 134.0 lb

## 2018-09-21 DIAGNOSIS — I739 Peripheral vascular disease, unspecified: Secondary | ICD-10-CM | POA: Diagnosis not present

## 2018-09-21 NOTE — Progress Notes (Signed)
Referring Physician: Dr Lona Kettle  Patient name: Dawn [name] MRN: [id number] DOB: February 11, 1941 Sex: female  REASON FOR CONSULT: Right hip and buttocks pain  HPI: Dawn [name] is a 78 y.o. female, with a 53-month history of right hip and buttocks pain.  Some of this pain emanates from deep in the joint.  However, she also states she has some pain in the muscle tissue of the buttocks.  She has some pain when she is just sitting still.  However, when she walks she also gets worsening pain that radiates all the way down the right leg.  She states the pain is probably been present for many years but only worse over the last 3 months.  She denies rest pain or tissue loss.  She denies prior history of stroke.  She states she had some evidence on an EKG 1 time that suggest she may have had a remote myocardial infarction.  She denies any chest pain.  She does get short of breath at less than 1 flight of stairs.  She has COPD but does not use home oxygen.  She currently smokes 1 pack of cigarettes per day.  She is not really interested in quitting.  She states she has tried several methods in the past that were unsuccessful.  Other chronic medical problems include hypertension diabetes hyperlipidemia all of which have been stable.  Her most recent hemoglobin A1c was 9.  She is on aspirin and a statin.  Although her history mentions home oxygen she states she is not on this.  Past Medical History:  Diagnosis Date  . Anxiety   . Breast cancer of upper-outer quadrant of left female breast ([place]) 08/18/2015  . Broken wrist    when younger x2  . COPD (chronic obstructive pulmonary disease) [facility])    Hospitalization March 2011  . Depression   . Diabetes mellitus without complication ([place])   . Full dentures   . H/O vaginal delivery 1962   x1   . High cholesterol   . History of home oxygen therapy   . Hypertension   . Pneumonia   . Radiation    11/18/15- 12/16/15 to her Left Breast   Past Surgical History:    Procedure Laterality Date  . ABDOMINAL HYSTERECTOMY    . ABDOMINAL HYSTERECTOMY    . APPENDECTOMY    . CHOLECYSTECTOMY    . EYE SURGERY     cataracts  . RADIOACTIVE SEED GUIDED PARTIAL MASTECTOMY WITH AXILLARY SENTINEL LYMPH NODE BIOPSY Left 09/17/2015   Procedure: RADIOACTIVE SEED LOCALIZATION LEFT BREAST LUMPECTOMY AND LEFT AXILLARY SENTINEL LYMPH NODE BIOPSY;  Surgeon: Excell Seltzer, MD;  Location: [facility];  Service: General;  Laterality: Left;  . TONSILLECTOMY      Family History  Problem Relation Age of Onset  . Colon cancer Sister   . Lung cancer Sister     SOCIAL HISTORY: Social History   Socioeconomic History  . Marital status: Legally Separated    Spouse name: Not on file  . Number of children: Not on file  . Years of education: Not on file  . Highest education level: Not on file  Occupational History  . Not on file  Social Needs  . Financial resource strain: Not on file  . Food insecurity:    Worry: Not on file    Inability: Not on file  . Transportation needs:    Medical: Not on file    Non-medical: Not on file  Tobacco Use  .  Smoking status: Current Every Day Smoker    Packs/day: 1.00    Types: Cigarettes  . Smokeless tobacco: Never Used  Substance and Sexual Activity  . Alcohol use: Yes    Comment: rare  . Drug use: No  . Sexual activity: Not on file  Lifestyle  . Physical activity:    Days per week: Not on file    Minutes per session: Not on file  . Stress: Not on file  Relationships  . Social connections:    Talks on phone: Not on file    Gets together: Not on file    Attends religious service: Not on file    Active member of club or organization: Not on file    Attends meetings of clubs or organizations: Not on file    Relationship status: Not on file  . Intimate partner violence:    Fear of current or ex partner: Not on file    Emotionally abused: Not on file    Physically abused: Not on file    Forced sexual activity: Not on file   Other Topics Concern  . Not on file  Social History Narrative  . Not on file    Allergies  Allergen Reactions  . Pneumococcal Vaccines Hives and Swelling    Arm swelled up  . Prandin [Repaglinide] Other (See Comments)    Drops BS too low.    Current Outpatient Medications  Medication Sig Dispense Refill  . acarbose (PRECOSE) 25 MG tablet Take 25 mg by mouth 3 (three) times daily with meals.     Marland Kitchen anastrozole (ARIMIDEX) 1 MG tablet Take 1 tablet (1 mg total) by mouth daily. 90 tablet 3  . aspirin EC 81 MG tablet Take 81 mg by mouth daily.    Marland Kitchen buPROPion (WELLBUTRIN XL) 300 MG 24 hr tablet Take 300 mg by mouth daily.    . Cholecalciferol (VITAMIN D3) 5000 UNITS TABS Take 5,000 Units by mouth daily.    Marland Kitchen glipiZIDE (GLUCOTROL XL) 2.5 MG 24 hr tablet Take 2.5 mg by mouth daily with breakfast.    . metFORMIN (GLUCOPHAGE-XR) 500 MG 24 hr tablet Take 1,000 mg by mouth 2 (two) times daily.    . pravastatin (PRAVACHOL) 40 MG tablet Take 40 mg by mouth daily.    . quinapril (ACCUPRIL) 40 MG tablet Take 40 mg by mouth daily.     . verapamil (CALAN-SR) 240 MG CR tablet Take 240 mg by mouth daily.     No current facility-administered medications for this visit.     ROS:   General:  No weight loss, Fever, chills  HEENT: No recent headaches, no nasal bleeding, no visual changes, no sore throat  Neurologic: No dizziness, blackouts, seizures. No recent symptoms of stroke or mini- stroke. No recent episodes of slurred speech, or temporary blindness.  Cardiac: No recent episodes of chest pain/pressure, no shortness of breath at rest.  + shortness of breath with exertion.  Denies history of atrial fibrillation or irregular heartbeat  Vascular: No history of rest pain in feet.  No history of claudication.  No history of non-healing ulcer, No history of DVT   Pulmonary: No home oxygen, no productive cough, no hemoptysis,  + asthma or wheezing  Musculoskeletal:  [X]  Arthritis, [X]  Low back  pain,  [X]  Joint pain  Hematologic:No history of hypercoagulable state.  No history of easy bleeding.  No history of anemia  Gastrointestinal: No hematochezia or melena,  No gastroesophageal reflux, no trouble swallowing  Urinary: [ ]  chronic Kidney disease, [ ]  on HD - [ ]  MWF or [ ]  TTHS, [ ]  Burning with urination, [ ]  Frequent urination, [ ]  Difficulty urinating;   Skin: No rashes  Psychological: No history of anxiety,  No history of depression   Physical Examination  Vitals:   09/21/18 0852  BP: (!) 187/84  Pulse: 92  Resp: 18  Temp: (!) 97.4 F (36.3 C)  TempSrc: Oral  SpO2: 93%  Weight: 134 lb (60.8 kg)  Height: 5\' 4"  (1.626 m)    Body mass index is 23 kg/m.  General:  Alert and oriented, no acute distress HEENT: Normal Neck: No bruit or JVD Pulmonary: Diffuse wheezing bilaterally Cardiac: Regular Rate and Rhythm without murmur Abdomen: Soft, non-tender, non-distended, no mass Skin: No rash Extremity Pulses:  2+ radial, brachial, absent right 2+ left femoral, popliteal dorsalis pedis, posterior tibial pulses bilaterally Musculoskeletal: No deformity or edema  Neurologic: Upper and lower extremity motor 5/5 and symmetric  DATA:  ABI 0.5 right 0.7 left  ASSESSMENT: Multilevel peripheral arterial disease most likely iliac occlusive disease on the right side combined with superficial femoral disease left side probably superficial femoral artery occlusive disease.  Currently has symptoms that are fairly limiting to her as far as ambulating.  I discussed with her the possibility of an arteriogram today for further evaluation and possible intervention.  However she had some tests done in the past for her back where she received some type of contrast material and states that she got very sick afterwards.  She did not really describe an anaphylactic event.  However, she is reluctant to receive contrast material.  I did discuss with her today that it would be very important  for her to try to quit smoking as the combination of diabetes and smoking increases risk of limb loss 100 fold.  She will think about this some more.  However, she does not think she will be able to quit smoking.   PLAN: Patient currently is opted for conservative management.  She will try to walk more.  She will try to quit smoking.  She will follow-up with [place] in 6 months time for repeat ABIs.  She will see our nurse practitioner at that office visit.  If she decides in the interim.  She wishes to pursue further evaluation we could schedule her for an arteriogram at any time.   Ruta Hinds, MD [facility] Office: [phone] Pager: [phone]

## 2018-10-10 ENCOUNTER — Ambulatory Visit: Payer: Medicare Other | Admitting: Hematology and Oncology

## 2018-10-10 NOTE — Assessment & Plan Note (Signed)
Left breast lumpectomy 09/17/2015: Invasive ductal carcinoma with calcifications, grade 1, 2.5 cm, low-grade DCIS, LVI present, 0/1 lymph nodes negative, T2 N0 stage II a, ER 100%, PR 20%, HER-2 negative, Ki-67 20% Oncotype DX score 9, 7% risk of recurrence Adjuvant radiation therapy from 11/18/2015 to 12/16/2015  Current treatment: Adjuvant antiestrogen therapy with anastrozole 1 mg daily 5 years started 12/19/2015  Anastrozole toxicities: Denies any hot flashes or myalgias. Tolerating it extremely well.  Tobacco abuse:Once again discussed smoking cessation but she is not interested in this.  She did however decrease the number of cigarettes she smokes.  Breast Cancer Surveillance: 1. Breast exam1/28/2020: Normal 2. Mammogram [facility]: Patient does not want to get any more mammograms because her breasts hurt due to mammograms  hypertension: Patient follows with her PCP for this.  She tells me that at home her blood pressures are normal.  Return to clinic in1 yrfor follow-up

## 2018-11-27 ENCOUNTER — Other Ambulatory Visit: Payer: Self-pay | Admitting: Hematology and Oncology

## 2019-03-22 ENCOUNTER — Encounter (HOSPITAL_COMMUNITY): Payer: Medicare Other

## 2019-03-22 ENCOUNTER — Ambulatory Visit: Payer: Medicare Other | Admitting: Family

## 2019-09-27 ENCOUNTER — Telehealth: Payer: Self-pay | Admitting: Hematology and Oncology

## 2019-09-27 ENCOUNTER — Other Ambulatory Visit: Payer: Self-pay | Admitting: Hematology and Oncology

## 2019-09-27 NOTE — Telephone Encounter (Signed)
Scheduled per 1/14 sch msg. Called pt no answer, unable to leave msg. Mailing printout

## 2019-11-01 ENCOUNTER — Telehealth: Payer: Self-pay | Admitting: Hematology and Oncology

## 2019-11-01 NOTE — Telephone Encounter (Signed)
Patient stated she did not want to reschedule visit at this time. I asked if she would like to sch a phone visit. I sent the message to MD that she will call when she is ready to schedule.

## 2019-11-02 ENCOUNTER — Inpatient Hospital Stay: Payer: Medicare Other | Admitting: Hematology and Oncology

## 2020-01-02 ENCOUNTER — Other Ambulatory Visit: Payer: Self-pay | Admitting: Hematology and Oncology

## 2020-03-04 ENCOUNTER — Other Ambulatory Visit: Payer: Self-pay | Admitting: Hematology and Oncology

## 2020-03-05 ENCOUNTER — Telehealth: Payer: Self-pay | Admitting: Hematology and Oncology

## 2020-03-05 NOTE — Telephone Encounter (Signed)
Scheduled per 6/22 sch message. No voicemail set up. Mailing pt appt calendar.

## 2020-03-10 ENCOUNTER — Other Ambulatory Visit: Payer: Self-pay | Admitting: Family Medicine

## 2020-03-10 DIAGNOSIS — R109 Unspecified abdominal pain: Secondary | ICD-10-CM

## 2020-03-10 DIAGNOSIS — R1011 Right upper quadrant pain: Secondary | ICD-10-CM

## 2020-03-12 ENCOUNTER — Ambulatory Visit
Admission: RE | Admit: 2020-03-12 | Discharge: 2020-03-12 | Disposition: A | Discharge status: Home / Self Care | Payer: Medicare Other | Source: Ambulatory Visit | Attending: Family Medicine | Admitting: Family Medicine

## 2020-03-12 DIAGNOSIS — R109 Unspecified abdominal pain: Secondary | ICD-10-CM

## 2020-03-12 DIAGNOSIS — R1011 Right upper quadrant pain: Secondary | ICD-10-CM

## 2020-03-13 ENCOUNTER — Other Ambulatory Visit (HOSPITAL_COMMUNITY): Payer: Self-pay | Admitting: Family Medicine

## 2020-03-13 ENCOUNTER — Other Ambulatory Visit: Payer: Self-pay | Admitting: Family Medicine

## 2020-03-13 DIAGNOSIS — K838 Other specified diseases of biliary tract: Secondary | ICD-10-CM

## 2020-03-13 DIAGNOSIS — K769 Liver disease, unspecified: Secondary | ICD-10-CM

## 2020-03-13 DIAGNOSIS — R935 Abnormal findings on diagnostic imaging of other abdominal regions, including retroperitoneum: Secondary | ICD-10-CM

## 2020-03-13 DIAGNOSIS — N289 Disorder of kidney and ureter, unspecified: Secondary | ICD-10-CM

## 2020-03-17 ENCOUNTER — Ambulatory Visit (INDEPENDENT_AMBULATORY_CARE_PROVIDER_SITE_OTHER): Payer: Medicare Other

## 2020-03-17 ENCOUNTER — Other Ambulatory Visit (HOSPITAL_COMMUNITY): Payer: Self-pay | Admitting: Family Medicine

## 2020-03-17 ENCOUNTER — Ambulatory Visit: Payer: Medicare Other

## 2020-03-17 ENCOUNTER — Other Ambulatory Visit: Payer: Self-pay

## 2020-03-17 DIAGNOSIS — K769 Liver disease, unspecified: Secondary | ICD-10-CM

## 2020-03-17 DIAGNOSIS — N289 Disorder of kidney and ureter, unspecified: Secondary | ICD-10-CM

## 2020-03-17 DIAGNOSIS — R935 Abnormal findings on diagnostic imaging of other abdominal regions, including retroperitoneum: Secondary | ICD-10-CM

## 2020-03-17 DIAGNOSIS — K838 Other specified diseases of biliary tract: Secondary | ICD-10-CM

## 2020-03-22 ENCOUNTER — Ambulatory Visit (HOSPITAL_BASED_OUTPATIENT_CLINIC_OR_DEPARTMENT_OTHER): Payer: Medicare Other

## 2020-03-22 ENCOUNTER — Other Ambulatory Visit (HOSPITAL_BASED_OUTPATIENT_CLINIC_OR_DEPARTMENT_OTHER): Payer: Medicare Other

## 2020-04-16 ENCOUNTER — Other Ambulatory Visit: Payer: Self-pay | Admitting: Hematology and Oncology

## 2020-05-15 ENCOUNTER — Other Ambulatory Visit: Payer: Self-pay | Admitting: Hematology and Oncology

## 2020-06-02 ENCOUNTER — Ambulatory Visit: Payer: Self-pay | Admitting: Neurology

## 2020-06-02 NOTE — Progress Notes (Signed)
Patient Care Team: Lawerance Cruel, MD as PCP - General (Family Medicine) Excell Seltzer, MD (Inactive) as Consulting Physician (General Surgery) Nicholas Lose, MD as Consulting Physician (Hematology and Oncology) Arloa Koh, MD (Inactive) as Consulting Physician (Radiation Oncology) Sylvan Cheese, NP as Nurse Practitioner (Hematology and Oncology) Eppie Gibson, MD as Attending Physician (Radiation Oncology)  DIAGNOSIS:    ICD-10-CM   1. Malignant neoplasm of upper-outer quadrant of left breast in female, estrogen receptor positive ([place])  C50.412    Z17.0     SUMMARY OF ONCOLOGIC HISTORY: Oncology History  Breast cancer of upper-outer quadrant of left female breast ([place])  08/14/2015 Mammogram   left breast focal asymmetry, ultrasound revealed 2.5 cm lesion. Because of breast pain, patient refuses diagnostic mammograms.   08/14/2015 Initial Diagnosis   left breast biopsy: Invasive ductal carcinoma with DCIS grade 1-2, ER 100%, PR 20%, Ki-67 20%, HER-2 negative ratio 1.77   08/14/2015 Clinical Stage   Stage IIA: T2 N0   09/17/2015 Surgery   left breast lumpectomy: Invasive ductal carcinoma with calcifications, grade 1, 2.5 cm, low-grade DCIS, LVI present, 0/1 lymph nodes negative, ER 100%, PR 20%, HER-2 negative, Ki-67 20%   09/17/2015 Pathologic Stage   Stage IIA: T2 N0   11/18/2015 - 12/16/2015 Radiation Therapy   Adjuvant radiation therapy: Left breast treated to 40.05 Gy in 15 fractions, Left breast boost treated to 10 Gy in 5 fractions   12/19/2015 -  Anti-estrogen oral therapy   Anastrozole 1 mg daily 5 years   02/12/2016 Survivorship   Survivorship visit completed and copy given to patient     CHIEF COMPLIANT: Follow-up of left breast cancer on anastrozole therapy  INTERVAL HISTORY: Dawn [name] is a 79 y.o. with above-mentioned history of left breast cancer treated with lumpectomy, radiation, and who is currently on anastrozole. She presents to the  clinic today for annual follow-up.  She continues to smoke a lot of cigarettes now.  She denies any lumps or nodules in the breast.  She is very tender in her left breast.  She denies any side effects to anastrozole therapy.  ALLERGIES:  is allergic to pneumococcal vaccines and prandin [repaglinide].  MEDICATIONS:  Current Outpatient Medications  Medication Sig Dispense Refill  . acarbose (PRECOSE) 25 MG tablet Take 25 mg by mouth 3 (three) times daily with meals.     Marland Kitchen anastrozole (ARIMIDEX) 1 MG tablet Take 1 tablet (1 mg total) by mouth daily. 90 tablet 3  . aspirin EC 81 MG tablet Take 81 mg by mouth daily.    . Cholecalciferol (VITAMIN D3) 5000 UNITS TABS Take 5,000 Units by mouth daily.    Marland Kitchen glipiZIDE (GLUCOTROL XL) 2.5 MG 24 hr tablet Take 2.5 mg by mouth daily with breakfast.    . metFORMIN (GLUCOPHAGE-XR) 500 MG 24 hr tablet Take 1,000 mg by mouth 2 (two) times daily.    . pentoxifylline (TRENTAL) 400 MG CR tablet Take 1 tablet (400 mg total) by mouth 3 (three) times daily with meals.    . pioglitazone (ACTOS) 15 MG tablet Take 1 tablet (15 mg total) by mouth daily.    . quinapril (ACCUPRIL) 40 MG tablet Take 40 mg by mouth daily.     . verapamil (CALAN-SR) 240 MG CR tablet Take 240 mg by mouth daily.     No current facility-administered medications for this visit.    PHYSICAL EXAMINATION: ECOG PERFORMANCE STATUS: 1 - Symptomatic but completely ambulatory  Vitals:   06/03/20 1001  BP: (!) 158/60  Pulse: 60  Resp: 18  Temp: 97.8 F (36.6 C)  SpO2: 100%   Filed Weights   06/03/20 1001  Weight: 126 lb 3.2 oz (57.2 kg)    BREAST: No palpable masses or nodules in either right or left breasts. No palpable axillary supraclavicular or infraclavicular adenopathy no breast tenderness or nipple discharge. (exam performed in the presence of a chaperone)  LABORATORY DATA:  I have reviewed the data as listed CMP Latest Ref Rng & Units 09/09/2015 08/20/2015 11/28/2009  Glucose 65 -  99 mg/dL 266(H) 225(H) 99  BUN 6 - 20 mg/dL 6 10.4 10  Creatinine 0.44 - 1.00 mg/dL 0.81 1.1 0.71  Sodium 135 - 145 mmol/L 135 137 140  Potassium 3.5 - 5.1 mmol/L 4.6 4.3 4.5  Chloride 101 - 111 mmol/L 99(L) - 97  CO2 22 - 32 mmol/L 25 25 38(H)  Calcium 8.9 - 10.3 mg/dL 9.4 10.0 9.2  Total Protein 6.4 - 8.3 g/dL - 7.1 -  Total Bilirubin 0.20 - 1.20 mg/dL - 0.32 -  Alkaline Phos 40 - 150 U/L - 142 -  AST 5 - 34 U/L - 18 -  ALT 0 - 55 U/L - 11 -    Lab Results  Component Value Date   WBC 6.4 09/09/2015   HGB 14.4 09/09/2015   HCT 43.9 09/09/2015   MCV 89.0 09/09/2015   PLT 317 09/09/2015   NEUTROABS 3.3 08/20/2015    ASSESSMENT & PLAN:  Breast cancer of upper-outer quadrant of left female breast ([place]) Left breast lumpectomy 09/17/2015: Invasive ductal carcinoma with calcifications, grade 1, 2.5 cm, low-grade DCIS, LVI present, 0/1 lymph nodes negative, T2 N0 stage II a, ER 100%, PR 20%, HER-2 negative, Ki-67 20% Oncotype DX score 9, 7% risk of recurrence Adjuvant radiation therapy from 11/18/2015 to 12/16/2015  Current treatment: Adjuvant antiestrogen therapy with anastrozole 1 mg daily 5 years started 12/19/2015  Anastrozole toxicities: Denies any hot flashes or myalgias. Tolerating it extremely well.  Tobacco abuse:Once again discussed smoking cessation but she is not interested in this.She did however decrease the number of cigarettes she smokes.  Breast Cancer Surveillance: 1. Breast exam9/21/2021: Benign 2. [facility]: We will try to get copies of her mammograms.   Return to clinic in1 yrfor follow-up   No orders of the defined types were placed in this encounter.  The patient has a good understanding of the overall plan. she agrees with it. she will call with any problems that may develop before the next visit here.  Total time spent: 20 mins including face to face time and time spent for planning, charting and coordination of care  Nicholas Lose, MD 06/03/2020  I, Cloyde Reams Dorshimer, am acting as scribe for Dr. Nicholas Lose.  I have reviewed the above documentation for accuracy and completeness, and I agree with the above.

## 2020-06-03 ENCOUNTER — Other Ambulatory Visit: Payer: Self-pay

## 2020-06-03 ENCOUNTER — Inpatient Hospital Stay: Payer: Medicare Other | Attending: Hematology and Oncology | Admitting: Hematology and Oncology

## 2020-06-03 DIAGNOSIS — Z170 Estrogen receptor positive status [ER+]: Secondary | ICD-10-CM | POA: Insufficient documentation

## 2020-06-03 DIAGNOSIS — C50412 Malignant neoplasm of upper-outer quadrant of left female breast: Secondary | ICD-10-CM | POA: Diagnosis not present

## 2020-06-03 DIAGNOSIS — Z79811 Long term (current) use of aromatase inhibitors: Secondary | ICD-10-CM | POA: Diagnosis not present

## 2020-06-03 DIAGNOSIS — Z79899 Other long term (current) drug therapy: Secondary | ICD-10-CM | POA: Insufficient documentation

## 2020-06-03 DIAGNOSIS — F17210 Nicotine dependence, cigarettes, uncomplicated: Secondary | ICD-10-CM | POA: Insufficient documentation

## 2020-06-03 MED ORDER — PIOGLITAZONE HCL 15 MG PO TABS
15.0000 mg | ORAL_TABLET | Freq: Every day | ORAL | Status: DC
Start: 2020-06-03 — End: 2020-12-04

## 2020-06-03 MED ORDER — PENTOXIFYLLINE ER 400 MG PO TBCR
400.0000 mg | EXTENDED_RELEASE_TABLET | Freq: Three times a day (TID) | ORAL | Status: DC
Start: 2020-06-03 — End: 2021-02-06

## 2020-06-03 MED ORDER — ANASTROZOLE 1 MG PO TABS: 1.0000 mg | ORAL_TABLET | Freq: Every day | ORAL | 3 refills | Status: DC

## 2020-06-03 NOTE — Assessment & Plan Note (Signed)
Left breast lumpectomy 09/17/2015: Invasive ductal carcinoma with calcifications, grade 1, 2.5 cm, low-grade DCIS, LVI present, 0/1 lymph nodes negative, T2 N0 stage II a, ER 100%, PR 20%, HER-2 negative, Ki-67 20% Oncotype DX score 9, 7% risk of recurrence Adjuvant radiation therapy from 11/18/2015 to 12/16/2015  Current treatment: Adjuvant antiestrogen therapy with anastrozole 1 mg daily 5 years started 12/19/2015  Anastrozole toxicities: Denies any hot flashes or myalgias. Tolerating it extremely well.  Tobacco abuse:Once again discussed smoking cessation but she is not interested in this.She did however decrease the number of cigarettes she smokes.  Breast Cancer Surveillance: 1. Breast exam9/21/2021: Benign 2. [facility]: Patient does not want to get any more mammograms because her breasts hurt due to mammograms  hypertension:Patient follows with her PCP for this. She tells me that at home her blood pressures are normal.  Return to clinic in1 yrfor follow-up

## 2020-06-16 ENCOUNTER — Other Ambulatory Visit: Payer: Self-pay | Admitting: Gastroenterology

## 2020-06-16 DIAGNOSIS — K769 Liver disease, unspecified: Secondary | ICD-10-CM

## 2020-09-18 DIAGNOSIS — R5381 Other malaise: Secondary | ICD-10-CM | POA: Diagnosis not present

## 2020-09-18 DIAGNOSIS — J449 Chronic obstructive pulmonary disease, unspecified: Secondary | ICD-10-CM | POA: Diagnosis not present

## 2020-09-18 DIAGNOSIS — E538 Deficiency of other specified B group vitamins: Secondary | ICD-10-CM | POA: Diagnosis not present

## 2020-09-18 DIAGNOSIS — E1165 Type 2 diabetes mellitus with hyperglycemia: Secondary | ICD-10-CM | POA: Diagnosis not present

## 2020-11-17 DIAGNOSIS — E1165 Type 2 diabetes mellitus with hyperglycemia: Secondary | ICD-10-CM | POA: Diagnosis not present

## 2020-11-17 DIAGNOSIS — R251 Tremor, unspecified: Secondary | ICD-10-CM | POA: Diagnosis not present

## 2020-11-17 DIAGNOSIS — R0789 Other chest pain: Secondary | ICD-10-CM | POA: Diagnosis not present

## 2020-12-03 DIAGNOSIS — R531 Weakness: Secondary | ICD-10-CM | POA: Diagnosis not present

## 2020-12-03 DIAGNOSIS — I959 Hypotension, unspecified: Secondary | ICD-10-CM | POA: Diagnosis not present

## 2020-12-03 DIAGNOSIS — Z743 Need for continuous supervision: Secondary | ICD-10-CM | POA: Diagnosis not present

## 2020-12-03 DIAGNOSIS — R0602 Shortness of breath: Secondary | ICD-10-CM | POA: Diagnosis not present

## 2020-12-03 DIAGNOSIS — R6889 Other general symptoms and signs: Secondary | ICD-10-CM | POA: Diagnosis not present

## 2020-12-04 ENCOUNTER — Emergency Department (HOSPITAL_COMMUNITY): Payer: Medicare Other

## 2020-12-04 ENCOUNTER — Other Ambulatory Visit: Payer: Self-pay

## 2020-12-04 ENCOUNTER — Inpatient Hospital Stay (HOSPITAL_COMMUNITY)
Admission: EM | Admit: 2020-12-04 | Discharge: 2020-12-06 | DRG: 811 | Disposition: A | Discharge status: Home / Self Care | Payer: Medicare Other | Attending: Internal Medicine | Admitting: Internal Medicine

## 2020-12-04 ENCOUNTER — Encounter (HOSPITAL_COMMUNITY): Payer: Self-pay

## 2020-12-04 DIAGNOSIS — F419 Anxiety disorder, unspecified: Secondary | ICD-10-CM | POA: Diagnosis present

## 2020-12-04 DIAGNOSIS — Z888 Allergy status to other drugs, medicaments and biological substances status: Secondary | ICD-10-CM

## 2020-12-04 DIAGNOSIS — Z20822 Contact with and (suspected) exposure to covid-19: Secondary | ICD-10-CM | POA: Diagnosis present

## 2020-12-04 DIAGNOSIS — D649 Anemia, unspecified: Secondary | ICD-10-CM | POA: Diagnosis present

## 2020-12-04 DIAGNOSIS — I342 Nonrheumatic mitral (valve) stenosis: Secondary | ICD-10-CM | POA: Diagnosis not present

## 2020-12-04 DIAGNOSIS — J449 Chronic obstructive pulmonary disease, unspecified: Secondary | ICD-10-CM

## 2020-12-04 DIAGNOSIS — J441 Chronic obstructive pulmonary disease with (acute) exacerbation: Secondary | ICD-10-CM | POA: Diagnosis not present

## 2020-12-04 DIAGNOSIS — J9601 Acute respiratory failure with hypoxia: Secondary | ICD-10-CM | POA: Diagnosis present

## 2020-12-04 DIAGNOSIS — I10 Essential (primary) hypertension: Secondary | ICD-10-CM | POA: Diagnosis not present

## 2020-12-04 DIAGNOSIS — E785 Hyperlipidemia, unspecified: Secondary | ICD-10-CM | POA: Diagnosis present

## 2020-12-04 DIAGNOSIS — Z79899 Other long term (current) drug therapy: Secondary | ICD-10-CM

## 2020-12-04 DIAGNOSIS — F32A Depression, unspecified: Secondary | ICD-10-CM | POA: Diagnosis not present

## 2020-12-04 DIAGNOSIS — R918 Other nonspecific abnormal finding of lung field: Secondary | ICD-10-CM

## 2020-12-04 DIAGNOSIS — Z7984 Long term (current) use of oral hypoglycemic drugs: Secondary | ICD-10-CM

## 2020-12-04 DIAGNOSIS — Z982 Presence of cerebrospinal fluid drainage device: Secondary | ICD-10-CM | POA: Diagnosis not present

## 2020-12-04 DIAGNOSIS — D509 Iron deficiency anemia, unspecified: Principal | ICD-10-CM | POA: Diagnosis present

## 2020-12-04 DIAGNOSIS — Z853 Personal history of malignant neoplasm of breast: Secondary | ICD-10-CM

## 2020-12-04 DIAGNOSIS — E119 Type 2 diabetes mellitus without complications: Secondary | ICD-10-CM

## 2020-12-04 DIAGNOSIS — Z801 Family history of malignant neoplasm of trachea, bronchus and lung: Secondary | ICD-10-CM | POA: Diagnosis not present

## 2020-12-04 DIAGNOSIS — E7800 Pure hypercholesterolemia, unspecified: Secondary | ICD-10-CM | POA: Diagnosis present

## 2020-12-04 DIAGNOSIS — R413 Other amnesia: Secondary | ICD-10-CM | POA: Diagnosis not present

## 2020-12-04 DIAGNOSIS — G9389 Other specified disorders of brain: Secondary | ICD-10-CM | POA: Diagnosis not present

## 2020-12-04 DIAGNOSIS — Z923 Personal history of irradiation: Secondary | ICD-10-CM

## 2020-12-04 DIAGNOSIS — R0602 Shortness of breath: Secondary | ICD-10-CM | POA: Diagnosis not present

## 2020-12-04 DIAGNOSIS — I2510 Atherosclerotic heart disease of native coronary artery without angina pectoris: Secondary | ICD-10-CM | POA: Diagnosis not present

## 2020-12-04 DIAGNOSIS — E162 Hypoglycemia, unspecified: Secondary | ICD-10-CM | POA: Diagnosis not present

## 2020-12-04 DIAGNOSIS — E11649 Type 2 diabetes mellitus with hypoglycemia without coma: Secondary | ICD-10-CM | POA: Diagnosis not present

## 2020-12-04 DIAGNOSIS — Z9012 Acquired absence of left breast and nipple: Secondary | ICD-10-CM | POA: Diagnosis not present

## 2020-12-04 DIAGNOSIS — F17210 Nicotine dependence, cigarettes, uncomplicated: Secondary | ICD-10-CM | POA: Diagnosis not present

## 2020-12-04 DIAGNOSIS — K219 Gastro-esophageal reflux disease without esophagitis: Secondary | ICD-10-CM | POA: Diagnosis not present

## 2020-12-04 DIAGNOSIS — J432 Centrilobular emphysema: Secondary | ICD-10-CM | POA: Diagnosis not present

## 2020-12-04 DIAGNOSIS — I700 Atherosclerosis of aorta: Secondary | ICD-10-CM | POA: Diagnosis not present

## 2020-12-04 DIAGNOSIS — Z7982 Long term (current) use of aspirin: Secondary | ICD-10-CM

## 2020-12-04 DIAGNOSIS — R531 Weakness: Secondary | ICD-10-CM | POA: Diagnosis not present

## 2020-12-04 DIAGNOSIS — E1165 Type 2 diabetes mellitus with hyperglycemia: Secondary | ICD-10-CM | POA: Diagnosis not present

## 2020-12-04 DIAGNOSIS — I517 Cardiomegaly: Secondary | ICD-10-CM | POA: Diagnosis not present

## 2020-12-04 LAB — RESP PANEL BY RT-PCR (FLU A&B, COVID) ARPGX2
Influenza A by PCR: NEGATIVE
Influenza B by PCR: NEGATIVE
SARS Coronavirus 2 by RT PCR: NEGATIVE

## 2020-12-04 LAB — CBG MONITORING, ED
Glucose-Capillary: 133 mg/dL — ABNORMAL HIGH (ref 70–99)
Glucose-Capillary: 172 mg/dL — ABNORMAL HIGH (ref 70–99)

## 2020-12-04 LAB — BASIC METABOLIC PANEL
Anion gap: 10 (ref 5–15)
BUN: 15 mg/dL (ref 8–23)
CO2: 27 mmol/L (ref 22–32)
Calcium: 9.4 mg/dL (ref 8.9–10.3)
Chloride: 96 mmol/L — ABNORMAL LOW (ref 98–111)
Creatinine, Ser: 1.03 mg/dL — ABNORMAL HIGH (ref 0.44–1.00)
GFR, Estimated: 56 mL/min — ABNORMAL LOW (ref >60)
Glucose, Bld: 151 mg/dL — ABNORMAL HIGH (ref 70–99)
Potassium: 4 mmol/L (ref 3.5–5.1)
Sodium: 133 mmol/L — ABNORMAL LOW (ref 135–145)

## 2020-12-04 LAB — CBC
HCT: 26.2 % — ABNORMAL LOW (ref 36.0–46.0)
Hemoglobin: 7.6 g/dL — ABNORMAL LOW (ref 12.0–15.0)
MCH: 23.2 pg — ABNORMAL LOW (ref 26.0–34.0)
MCHC: 29 g/dL — ABNORMAL LOW (ref 30.0–36.0)
MCV: 79.9 fL — ABNORMAL LOW (ref 80.0–100.0)
Platelets: 548 10*3/uL — ABNORMAL HIGH (ref 150–400)
RBC: 3.28 MIL/uL — ABNORMAL LOW (ref 3.87–5.11)
RDW: 19.7 % — ABNORMAL HIGH (ref 11.5–15.5)
WBC: 6.6 10*3/uL (ref 4.0–10.5)
nRBC: 0 % (ref 0.0–0.2)

## 2020-12-04 LAB — POC OCCULT BLOOD, ED: Fecal Occult Bld: NEGATIVE

## 2020-12-04 LAB — TROPONIN I (HIGH SENSITIVITY): Troponin I (High Sensitivity): 8 ng/L (ref <18)

## 2020-12-04 LAB — BRAIN NATRIURETIC PEPTIDE: B Natriuretic Peptide: 96.3 pg/mL (ref 0.0–100.0)

## 2020-12-04 LAB — PREPARE RBC (CROSSMATCH)

## 2020-12-04 LAB — ABO/RH: ABO/RH(D): O POS

## 2020-12-04 MED ORDER — INSULIN ASPART 100 UNIT/ML ~~LOC~~ SOLN
0.0000 [IU] | Freq: Three times a day (TID) | SUBCUTANEOUS | Status: DC
  Administered 2020-12-05: 2 [IU] via SUBCUTANEOUS
  Administered 2020-12-05 (×2): 7 [IU] via SUBCUTANEOUS
  Administered 2020-12-06: 5 [IU] via SUBCUTANEOUS
  Filled 2020-12-04: qty 0.09

## 2020-12-04 MED ORDER — ALBUTEROL SULFATE (2.5 MG/3ML) 0.083% IN NEBU
5.0000 mg | INHALATION_SOLUTION | Freq: Once | RESPIRATORY_TRACT | Status: AC
  Administered 2020-12-04: 5 mg via RESPIRATORY_TRACT
  Filled 2020-12-04: qty 6

## 2020-12-04 MED ORDER — ALBUTEROL SULFATE HFA 108 (90 BASE) MCG/ACT IN AERS
2.0000 | INHALATION_SPRAY | RESPIRATORY_TRACT | Status: DC | PRN
  Filled 2020-12-04: qty 6.7

## 2020-12-04 MED ORDER — ACETAMINOPHEN 325 MG PO TABS: 650.0000 mg | ORAL_TABLET | Freq: Four times a day (QID) | ORAL | Status: DC | PRN

## 2020-12-04 MED ORDER — IOHEXOL 300 MG/ML  SOLN
75.0000 mL | Freq: Once | INTRAMUSCULAR | Status: AC | PRN
  Administered 2020-12-04: 75 mL via INTRAVENOUS

## 2020-12-04 MED ORDER — ROSUVASTATIN CALCIUM 20 MG PO TABS
40.0000 mg | ORAL_TABLET | Freq: Every day | ORAL | Status: DC
  Administered 2020-12-05 – 2020-12-06 (×2): 40 mg via ORAL
  Filled 2020-12-04 (×2): qty 2

## 2020-12-04 MED ORDER — QUINAPRIL HCL 10 MG PO TABS
40.0000 mg | ORAL_TABLET | Freq: Every day | ORAL | Status: DC
  Filled 2020-12-04: qty 4

## 2020-12-04 MED ORDER — METHYLPREDNISOLONE SODIUM SUCC 125 MG IJ SOLR
125.0000 mg | Freq: Once | INTRAMUSCULAR | Status: AC
  Administered 2020-12-04: 125 mg via INTRAVENOUS
  Filled 2020-12-04: qty 2

## 2020-12-04 MED ORDER — IPRATROPIUM BROMIDE 0.02 % IN SOLN
0.5000 mg | Freq: Once | RESPIRATORY_TRACT | Status: AC
  Administered 2020-12-04: 0.5 mg via RESPIRATORY_TRACT
  Filled 2020-12-04: qty 2.5

## 2020-12-04 MED ORDER — SODIUM CHLORIDE 0.9% FLUSH
3.0000 mL | Freq: Two times a day (BID) | INTRAVENOUS | Status: DC
  Administered 2020-12-04 – 2020-12-06 (×4): 3 mL via INTRAVENOUS

## 2020-12-04 MED ORDER — ALBUTEROL SULFATE (2.5 MG/3ML) 0.083% IN NEBU
2.5000 mg | INHALATION_SOLUTION | RESPIRATORY_TRACT | Status: DC | PRN
  Administered 2020-12-05: 2.5 mg via RESPIRATORY_TRACT
  Filled 2020-12-04 (×2): qty 3

## 2020-12-04 MED ORDER — PANTOPRAZOLE SODIUM 40 MG PO TBEC
40.0000 mg | DELAYED_RELEASE_TABLET | Freq: Every day | ORAL | Status: DC
  Administered 2020-12-05 – 2020-12-06 (×2): 40 mg via ORAL
  Filled 2020-12-04 (×2): qty 1

## 2020-12-04 MED ORDER — SODIUM CHLORIDE 0.9% IV SOLUTION: Freq: Once | INTRAVENOUS | Status: AC

## 2020-12-04 MED ORDER — MAGNESIUM SULFATE 2 GM/50ML IV SOLN
2.0000 g | Freq: Once | INTRAVENOUS | Status: AC
  Administered 2020-12-04: 2 g via INTRAVENOUS
  Filled 2020-12-04: qty 50

## 2020-12-04 MED ORDER — ACETAMINOPHEN 650 MG RE SUPP: 650.0000 mg | Freq: Four times a day (QID) | RECTAL | Status: DC | PRN

## 2020-12-04 MED ORDER — POLYETHYLENE GLYCOL 3350 17 G PO PACK: 17.0000 g | PACK | Freq: Every day | ORAL | Status: DC | PRN

## 2020-12-04 NOTE — ED Notes (Signed)
Patient off floor for scan

## 2020-12-04 NOTE — ED Provider Notes (Signed)
[facility] DEPT Provider Note   CSN: [id number] Arrival date & time: 12/04/20  1757     History Chief Complaint  Patient presents with  . COPD  . Hypoglycemia    Dawn [name] is a 80 y.o. female.  Patient is a 80 year old female with a history of hypertension, COPD with ongoing tobacco use, diabetes and prior breast cancer in remission who is presenting today with multiple complaints.  Son gives most of the history and reports over the last month or so she is been having harder time remembering to do things.  She has been forgetting to eat and often times not eating well when she does eat.  In the last 2 weeks EMS had been called out to the house 6 times.  Patient has been intermittently short of breath, weak with any activity and having intermittent issues with her blood sugar being low.  She denies any new cough or sputum production.  She has not had fever.  She reports the shortness of breath comes and goes but she is significantly short of breath if she attempts to lay down.  No chest pain, abdominal pain, vomiting or fever.  She denies any urinary complaints.  She has been taking her medications as prescribed and her doctor stopped the glipizide today.  Her doctor checked her blood work yesterday and found that she had new evidence of anemia with a hemoglobin in the sevens from 10 earlier this month.  Patient denies any blood in her stool or prior history of blood transfusions or GI bleeding.  She takes an aspirin but no other anticoagulation.  No alcohol use.  The history is provided by the patient and a relative.  COPD  Hypoglycemia      Past Medical History:  Diagnosis Date  . Anxiety   . Breast cancer of upper-outer quadrant of left female breast ([place]) 08/18/2015  . Broken wrist    when younger x2  . COPD (chronic obstructive pulmonary disease) [facility])    Hospitalization March 2011  . Depression   . Diabetes mellitus without complication ([place])    . Full dentures   . H/O vaginal delivery 1962   x1   . High cholesterol   . History of home oxygen therapy   . Hypertension   . Pneumonia   . Radiation    11/18/15- 12/16/15 to her Left Breast    Patient Active Problem List   Diagnosis Date Noted  . Tobacco abuse 09/30/2015  . Breast cancer of upper-outer quadrant of left female breast ([place]) 08/18/2015  . Essential hypertension, benign 05/22/2014  . Mixed hyperlipidemia 05/22/2014    Past Surgical History:  Procedure Laterality Date  . ABDOMINAL HYSTERECTOMY    . ABDOMINAL HYSTERECTOMY    . APPENDECTOMY    . CHOLECYSTECTOMY    . EYE SURGERY     cataracts  . RADIOACTIVE SEED GUIDED PARTIAL MASTECTOMY WITH AXILLARY SENTINEL LYMPH NODE BIOPSY Left 09/17/2015   Procedure: RADIOACTIVE SEED LOCALIZATION LEFT BREAST LUMPECTOMY AND LEFT AXILLARY SENTINEL LYMPH NODE BIOPSY;  Surgeon: Excell Seltzer, MD;  Location: [facility];  Service: General;  Laterality: Left;  . TONSILLECTOMY       OB History   No obstetric history on file.     Family History  Problem Relation Age of Onset  . Colon cancer Sister   . Lung cancer Sister     Social History   Tobacco Use  . Smoking status: Current Every Day Smoker  Packs/day: 1.00    Types: Cigarettes  . Smokeless tobacco: Never Used  Vaping Use  . Vaping Use: Never used  Substance Use Topics  . Alcohol use: Yes    Comment: rare  . Drug use: No    Home Medications Prior to Admission medications   Medication Sig Start Date End Date Taking? Authorizing Provider  acarbose (PRECOSE) 25 MG tablet Take 25 mg by mouth 3 (three) times daily with meals.  06/12/15   [provider]  anastrozole (ARIMIDEX) 1 MG tablet Take 1 tablet (1 mg total) by mouth daily. 06/03/20   Nicholas Lose, MD  aspirin EC 81 MG tablet Take 81 mg by mouth daily.    [provider]  Cholecalciferol (VITAMIN D3) 5000 UNITS TABS Take 5,000 Units by mouth daily.    [provider]  glipiZIDE  (GLUCOTROL XL) 2.5 MG 24 hr tablet Take 2.5 mg by mouth daily with breakfast.    [provider]  metFORMIN (GLUCOPHAGE-XR) 500 MG 24 hr tablet Take 1,000 mg by mouth 2 (two) times daily. 06/10/15   [provider]  pentoxifylline (TRENTAL) 400 MG CR tablet Take 1 tablet (400 mg total) by mouth 3 (three) times daily with meals. 06/03/20   Nicholas Lose, MD  pioglitazone (ACTOS) 15 MG tablet Take 1 tablet (15 mg total) by mouth daily. 06/03/20   Nicholas Lose, MD  quinapril (ACCUPRIL) 40 MG tablet Take 40 mg by mouth daily.     [provider]  verapamil (CALAN-SR) 240 MG CR tablet Take 240 mg by mouth daily. 06/10/15   [provider]    Allergies    Pneumococcal vaccines and Prandin [repaglinide]  Review of Systems   Review of Systems  All other systems reviewed and are negative.   Physical Exam Updated Vital Signs BP (!) 124/56 (BP Location: Left Arm)   Pulse 76   Temp 97.8 F (36.6 C) (Oral)   Resp 18   Ht 5\' 4"  (1.626 m)   Wt 61.2 kg   SpO2 97%   BMI 23.17 kg/m   Physical Exam Vitals and nursing note reviewed.  Constitutional:      General: She is not in acute distress.    Appearance: She is well-developed.  HENT:     Head: Normocephalic and atraumatic.     Mouth/Throat:     Mouth: Mucous membranes are moist.  Eyes:     Pupils: Pupils are equal, round, and reactive to light.     Comments: pale  Cardiovascular:     Rate and Rhythm: Normal rate and regular rhythm.     Pulses: Normal pulses.     Heart sounds: Normal heart sounds. No murmur heard. No friction rub.  Pulmonary:     Effort: Pulmonary effort is normal. Tachypnea present.     Breath sounds: Decreased air movement present. Wheezing present. No rales.  Abdominal:     General: Bowel sounds are normal. There is no distension.     Palpations: Abdomen is soft.     Tenderness: There is no abdominal tenderness. There is no guarding or rebound.  Genitourinary:    Comments: Brown  stool Musculoskeletal:        General: No tenderness. Normal range of motion.     Right lower leg: No edema.     Left lower leg: No edema.     Comments: No edema  Skin:    General: Skin is warm and dry.     Coloration: Skin is  pale.     Findings: No rash.  Neurological:     General: No focal deficit present.     Mental Status: She is alert and oriented to person, place, and time. Mental status is at baseline.     Cranial Nerves: No cranial nerve deficit.  Psychiatric:        Mood and Affect: Mood normal.        Behavior: Behavior normal.        Thought Content: Thought content normal.     ED Results / Procedures / Treatments   Labs (all labs ordered are listed, but only abnormal results are displayed) Labs Reviewed  BASIC METABOLIC PANEL - Abnormal; Notable for the following components:      Result Value   Sodium 133 (*)    Chloride 96 (*)    Glucose, Bld 151 (*)    Creatinine, Ser 1.03 (*)    GFR, Estimated 56 (*)    All other components within normal limits  CBC - Abnormal; Notable for the following components:   RBC 3.28 (*)    Hemoglobin 7.6 (*)    HCT 26.2 (*)    MCV 79.9 (*)    MCH 23.2 (*)    MCHC 29.0 (*)    RDW 19.7 (*)    Platelets 548 (*)    All other components within normal limits  CBG MONITORING, ED - Abnormal; Notable for the following components:   Glucose-Capillary 133 (*)    All other components within normal limits  RESP PANEL BY RT-PCR (FLU A&B, COVID) ARPGX2  BRAIN NATRIURETIC PEPTIDE  POC OCCULT BLOOD, ED  TYPE AND SCREEN  PREPARE RBC (CROSSMATCH)  TROPONIN I (HIGH SENSITIVITY)    EKG EKG Interpretation  Date/Time:  Thursday December 04 2020 20:17:17 EDT Ventricular Rate:  75 PR Interval:    QRS Duration: 79 QT Interval:  392 QTC Calculation: 438 R Axis:   73 Text Interpretation: Sinus rhythm Low voltage, precordial leads No significant change since last tracing Confirmed by Blanchie Dessert [phone]) on 12/04/2020 8:57:28 PM  ED ECG  REPORT   Date: 12/04/2020  Rate: 75  Rhythm: normal sinus rhythm  QRS Axis: normal  Intervals: normal  ST/T Wave abnormalities: nonspecific ST changes  Conduction Disutrbances:none  Narrative Interpretation:   Old EKG Reviewed: none available  I have personally reviewed the EKG tracing and agree with the computerized printout as noted.  Radiology DG Chest 2 View  Result Date: 12/04/2020 CLINICAL DATA:  COPD, shortness of breath EXAM: CHEST - 2 VIEW COMPARISON:  10/10/2012 FINDINGS: There is hyperinflation of the lungs compatible with COPD. Heart is mildly enlarged. Aortic atherosclerosis. Airspace opacity in the right mid lung. Left lung clear. No effusions or acute bony abnormality. IMPRESSION: COPD, cardiomegaly. Right mid lung airspace opacity could reflect pneumonia. Aortic atherosclerosis. Electronically Signed   By: Rolm Baptise M.D.   On: 12/04/2020 18:58    Procedures Procedures   Medications Ordered in ED Medications  albuterol (VENTOLIN HFA) 108 (90 Base) MCG/ACT inhaler 2 puff (has no administration in time range)  albuterol (PROVENTIL) (2.5 MG/3ML) 0.083% nebulizer solution 5 mg (has no administration in time range)  ipratropium (ATROVENT) nebulizer solution 0.5 mg (has no administration in time range)    ED Course  I have reviewed the triage vital signs and the nursing notes.  Pertinent labs & imaging results that were available during my care of the patient were reviewed by me and considered in my medical decision making (see  chart for details).    MDM Rules/Calculators/A&P                          Elderly female presenting today with worsening shortness of breath and orthopnea.  Patient also having generalized weakness.  On exam patient is pale and tachypneic with generalized wheezing.  She does have a history of COPD and chronic ongoing tobacco use.  When lying the patient down she becomes extremely dyspneic requiring her to sit up in be placed on oxygen.  She  has no evidence of distal edema or significant abdominal swelling consistent with CHF.  Concern for COPD exacerbation but patient also had lab work done yesterday from PCPs office that showed a new onset anemia.  Patient has no prior history of GI bleeding and takes aspirin but no other anticoagulation.  Stool today is brown.  Hemoglobin is confirmed to be 7.6 from 10.5 1 month ago.  EKG without acute findings.  Chest x-ray today shows COPD, mild cardiomegaly and right midlung airspace opacity which could reflect pneumonia however patient has not had new cough, congestion fever or other significant findings.  Concern for possible malignancy given poor oral intake recently, weakness and new onset anemia.  Also white count within normal limits at 6.6.  Patient given albuterol and Atrovent.  She was placed on 2 L of oxygen.  Patient will most likely need blood transfusion but want to ensure no evidence of fluid overload first.  9:05 PM On repeat evaluation after albuterol and Atrovent patient is actually wheezing more now but still is having increased work of breathing.  Will give Solu-Medrol, magnesium and another albuterol and Atrovent.  Patient's troponin and BNP are both within normal limits.  Transfusion was ordered.  Feel that patient does need a CT with contrast of her chest to further evaluate this area on the right side to ensure no evidence of malignancy but still low suspicion for pneumonia.  However at this time patient cannot lay flat due to significant shortness of breath.  Until the shortness of breath is improved do not feel that she can undergo CT.  We will plan on admission and hopefully after more therapy she will be able to lie down more comfortably and can receive the CT.  MDM Number of Diagnoses or Management Options   Amount and/or Complexity of Data Reviewed Clinical lab tests: ordered and reviewed Tests in the radiology section of CPT: ordered and reviewed Tests in the medicine  section of CPT: ordered and reviewed Decide to obtain previous medical records or to obtain history from someone other than the patient: yes Obtain history from someone other than the patient: yes Review and summarize past medical records: yes Discuss the patient with other providers: yes Independent visualization of images, tracings, or specimens: yes  Risk of Complications, Morbidity, and/or Mortality Presenting problems: high Diagnostic procedures: high Management options: high  Patient Progress Patient progress: improved  CRITICAL CARE Performed by: Annie Roseboom Total critical care time: 30 minutes Critical care time was exclusive of separately billable procedures and treating other patients. Critical care was necessary to treat or prevent imminent or life-threatening deterioration. Critical care was time spent personally by me on the following activities: development of treatment plan with patient and/or surrogate as well as nursing, discussions with consultants, evaluation of patient's response to treatment, examination of patient, obtaining history from patient or surrogate, ordering and performing treatments and interventions, ordering and review of laboratory studies,  ordering and review of radiographic studies, pulse oximetry and re-evaluation of patient's condition.  Final Clinical Impression(s) / ED Diagnoses Final diagnoses:  Symptomatic anemia  COPD exacerbation [facility])    Rx / DC Orders ED Discharge Orders    None       Blanchie Dessert, MD 12/04/20 2140

## 2020-12-04 NOTE — H&P (Signed)
History and Physical   HEAVIN SEBREE OVF:[id number] DOB: Jan 18, 1941 DOA: 12/04/2020  PCP: Lawerance Cruel, MD   Patient coming from: Home  Chief Complaint: Weakness, shortness of breath  HPI: Dawn [name] is a 80 y.o. female with medical history significant of breast cancer, hypertension, hyperlipidemia, COPD, anxiety, diabetes who presents with intermittent shortness of breath and worsening weakness.  Also found to be anemic a PCP recently.  History obtained with the assistance of the patient's son.  Patient had been off her baseline for the past month.  Initially she had had some issues with her memory and decreased p.o. intake.  The past couple weeks she has had intermittent shortness of breath she has had weakness with activity she has had intermittent low blood sugars.  EMS had been called out to the home about 6 times in that time period.  Patient was taken to her PCP yesterday and was noted to have a hemoglobin in the sevens which is down from 10 earlier in the month.  Due to this and her ongoing symptoms patient was brought to the ED for further evaluation.  Patient reports some brief episodes of sharp chest pain not associated with anything that resolved spontaneously.  No chest pain currently. Patient denies fevers, chills, abdominal pain, constipation, diarrhea, nausea, vomiting.  ED Course: Vital signs in the ED were stable.  Lab work-up showed CMP with sodium of 133 in the setting of glucose of 151, chloride 96, creatinine of 1.03 which appears to be stable.  CBC confirmed hemoglobin 7.6, platelets 548.  Troponin normal, BNP normal, respiratory panel flu COVID pending.  FOBT was negative.  Chest x-ray showed COPD and a right opacity.  CT chest has been ordered results are pending.  Review of Systems: As per HPI otherwise all other systems reviewed and are negative.  Past Medical History:  Diagnosis Date  . Anxiety   . Breast cancer of upper-outer quadrant of left female breast  ([place]) 08/18/2015  . Broken wrist    when younger x2  . COPD (chronic obstructive pulmonary disease) [facility])    Hospitalization March 2011  . Depression   . Diabetes mellitus without complication ([place])   . Full dentures   . H/O vaginal delivery 1962   x1   . High cholesterol   . History of home oxygen therapy   . Hypertension   . Pneumonia   . Radiation    11/18/15- 12/16/15 to her Left Breast    Past Surgical History:  Procedure Laterality Date  . ABDOMINAL HYSTERECTOMY    . ABDOMINAL HYSTERECTOMY    . APPENDECTOMY    . CHOLECYSTECTOMY    . EYE SURGERY     cataracts  . RADIOACTIVE SEED GUIDED PARTIAL MASTECTOMY WITH AXILLARY SENTINEL LYMPH NODE BIOPSY Left 09/17/2015   Procedure: RADIOACTIVE SEED LOCALIZATION LEFT BREAST LUMPECTOMY AND LEFT AXILLARY SENTINEL LYMPH NODE BIOPSY;  Surgeon: Excell Seltzer, MD;  Location: [place];  Service: General;  Laterality: Left;  . TONSILLECTOMY      Social History  reports that she has been smoking cigarettes. She has been smoking about 1.00 pack per day. She has never used smokeless tobacco. She reports current alcohol use. She reports that she does not use drugs.  Allergies  Allergen Reactions  . Pneumococcal Vaccines Hives and Swelling    Arm swelled up  . Prandin [Repaglinide] Other (See Comments)    Drops BS too low.    Family History  Problem Relation Age of Onset  .  Colon cancer Sister   . Lung cancer Sister   Reviewed on admission  Prior to Admission medications   Medication Sig Start Date End Date Taking? Authorizing Provider  acarbose (PRECOSE) 25 MG tablet Take 25 mg by mouth 3 (three) times daily with meals.  06/12/15   [provider]  anastrozole (ARIMIDEX) 1 MG tablet Take 1 tablet (1 mg total) by mouth daily. 06/03/20   Nicholas Lose, MD  aspirin EC 81 MG tablet Take 81 mg by mouth daily.    [provider]  Cholecalciferol (VITAMIN D3) 5000 UNITS TABS Take 5,000 Units by mouth daily.    [provider]  glipiZIDE (GLUCOTROL XL) 2.5 MG 24 hr tablet Take 2.5 mg by mouth daily with breakfast.    [provider]  metFORMIN (GLUCOPHAGE-XR) 500 MG 24 hr tablet Take 1,000 mg by mouth 2 (two) times daily. 06/10/15   [provider]  pentoxifylline (TRENTAL) 400 MG CR tablet Take 1 tablet (400 mg total) by mouth 3 (three) times daily with meals. 06/03/20   Nicholas Lose, MD  pioglitazone (ACTOS) 15 MG tablet Take 1 tablet (15 mg total) by mouth daily. 06/03/20   Nicholas Lose, MD  quinapril (ACCUPRIL) 40 MG tablet Take 40 mg by mouth daily.     [provider]  verapamil (CALAN-SR) 240 MG CR tablet Take 240 mg by mouth daily. 06/10/15   [provider]    Physical Exam: Vitals:   12/04/20 1817 12/04/20 1818 12/04/20 2030 12/04/20 2130  BP: (!) 124/56  (!) 157/54 135/60  Pulse: 76  71 72  Resp: 18  (!) 21 18  Temp: 97.8 F (36.6 C)     TempSrc: Oral     SpO2: 97%  100% 100%  Weight:  61.2 kg    Height:  5\' 4"  (1.626 m)     Physical Exam Constitutional:      General: She is not in acute distress.    Appearance: Normal appearance.  HENT:     Head: Normocephalic and atraumatic.     Mouth/Throat:     Mouth: Mucous membranes are moist.     Pharynx: Oropharynx is clear.  Eyes:     Extraocular Movements: Extraocular movements intact.     Pupils: Pupils are equal, round, and reactive to light.  Cardiovascular:     Rate and Rhythm: Normal rate and regular rhythm.     Pulses: Normal pulses.     Heart sounds: Normal heart sounds.  Pulmonary:     Effort: Pulmonary effort is normal. No respiratory distress.     Breath sounds: Rhonchi (At R mid-lung) present.  Abdominal:     General: Bowel sounds are normal. There is no distension.     Palpations: Abdomen is soft.     Tenderness: There is no abdominal tenderness.  Musculoskeletal:        General: No swelling or deformity.  Skin:    General: Skin is warm and dry.  Neurological:     General: No  focal deficit present.     Mental Status: Mental status is at baseline.    Labs on Admission: I have personally reviewed following labs and imaging studies  CBC: Recent Labs  Lab 12/04/20 1903  WBC 6.6  HGB 7.6*  HCT 26.2*  MCV 79.9*  PLT 548*    Basic Metabolic Panel: Recent Labs  Lab 12/04/20 1903  NA 133*  K 4.0  CL 96*  CO2 27  GLUCOSE 151*  BUN 15  CREATININE 1.03*  CALCIUM 9.4    GFR: Estimated Creatinine Clearance: 38.9 mL/min (A) (by C-G formula based on SCr of 1.03 mg/dL (H)).  Liver Function Tests: No results for input(s): AST, ALT, ALKPHOS, BILITOT, PROT, ALBUMIN in the last 168 hours.  Urine analysis:    Component Value Date/Time   COLORURINE YELLOW 11/25/2009 1109   APPEARANCEUR CLEAR 11/25/2009 1109   LABSPEC 1.015 11/25/2009 1109   PHURINE 6.0 11/25/2009 1109   GLUCOSEU NEGATIVE 11/25/2009 1109   HGBUR SMALL (A) 11/25/2009 1109   BILIRUBINUR NEGATIVE 11/25/2009 1109   KETONESUR NEGATIVE 11/25/2009 1109   PROTEINUR 100 (A) 11/25/2009 1109   UROBILINOGEN 0.2 11/25/2009 1109   NITRITE NEGATIVE 11/25/2009 1109   LEUKOCYTESUR NEGATIVE 11/25/2009 1109    Radiological Exams on Admission: DG Chest 2 View  Result Date: 12/04/2020 CLINICAL DATA:  COPD, shortness of breath EXAM: CHEST - 2 VIEW COMPARISON:  10/10/2012 FINDINGS: There is hyperinflation of the lungs compatible with COPD. Heart is mildly enlarged. Aortic atherosclerosis. Airspace opacity in the right mid lung. Left lung clear. No effusions or acute bony abnormality. IMPRESSION: COPD, cardiomegaly. Right mid lung airspace opacity could reflect pneumonia. Aortic atherosclerosis. Electronically Signed   By: Rolm Baptise M.D.   On: 12/04/2020 18:58   EKG: Independently reviewed.  EKG unable to be reviewed due to technical difficulties with EMR.  Assessment/Plan Principal Problem:   Symptomatic anemia Active Problems:   Essential hypertension, benign   Diabetes (HCC)   COPD (chronic  obstructive pulmonary disease) (HCC)  Shortness of breath Symptomatic anemia > Presents with worsening hemoglobin now 7.6 per reports it had previously been 10 earlier this month at her PCP office. > Has had ongoing worsening shortness of breath and weakness. > Transfusion ordered in ED.  Hemoccult negative in ED. > Etiology unclear may be a hematologic/oncologic process as there is some concern for lung mass based on initial imaging.  Has history of breast cancer. > Has significant smoking history and sister with a history of lung cancer. - Continue with transfusion - Follow-up post contrast transfusion H&H and trend hemoglobin - Have added on iron studies, B12, folate, reticulocytes Addendum > Mass in the right lung concerning for malignancy. - Will benefit from oncology consult in the AM.  COPD ?Exacerbation > Has history of this and is noted on imaging.  She has some shortness of breath, but does not take any chronic inhalers. > Received steroids in the ED - Continue with as needed albuterol  Hypertension > Blood pressure stable in the ED - Continue home quinapril - Holding verapamil for now   Diabetes > Has had some low blood sugars recently - Holding orals - SSI  Hyperlipidemia - Continue home rosuvastatin  GERD - Continue home PPI  DVT prophylaxis: SCDs for now as anemia work-up is ongoing  Code Status:   Full  Family Communication:  Son updated by phone.   Disposition Plan:   Patient is from:  Home  Anticipated DC to:  Home  Anticipated DC date:  1 to 5 days  Anticipated DC barriers: None  Consults called:  None  Admission status:  Observation, telemetry   Severity of Illness: The appropriate patient status for this patient is OBSERVATION. Observation status is judged to be reasonable and necessary in order to provide the required intensity of service to ensure the patient's safety. The patient's presenting symptoms, physical exam findings, and initial  radiographic and laboratory data in the context of their medical  condition is felt to place them at decreased risk for further clinical deterioration. Furthermore, it is anticipated that the patient will be medically stable for discharge from the hospital within 2 midnights of admission. The following factors support the patient status of observation.   " The patient's presenting symptoms include shortness of breath, weakness. " The physical exam findings include rhonchi right lobe.. " The initial radiographic and laboratory data are hemoglobin 7.6, platelets 548, sodium 133, glucose 151.  Chest x-ray with COPD and right opacity suspicious for pneumonia versus other process.  CT chest with mass concerning for malignancy   Marcelyn Bruins MD [facility]  How to contact the [facility] Attending or Consulting provider [place] or covering provider during after hours [place], for this patient?   1. Check the care team in [facility] and look for a) attending/consulting TR[facility] provider listed and b) the [facility] team listed 2. Log into [email or web address] and use [place]'s universal password to access. If you do not have the password, please contact the hospital operator. 3. Locate the [facility] provider you are looking for under [facility] and page to a number that you can be directly reached. 4. If you still have difficulty reaching the provider, please page the [facility] (Director on Call) for the Hospitalists listed on amion for assistance.  12/04/2020, 10:41 PM

## 2020-12-04 NOTE — ED Triage Notes (Signed)
Patient reports she has increased SOB at times. Patient has COPD and continues to smoke. Patient also reported that her blood sugar was low. CBG in triage-133. Patient had a coke in her purse and states she had been drinking that.

## 2020-12-04 NOTE — ED Notes (Signed)
ED Provider at bedside. 

## 2020-12-04 NOTE — ED Notes (Signed)
SBAR in chart, vitals up to date. Kerin Ransom, RN messaged. Waiting for response.

## 2020-12-04 NOTE — ED Notes (Signed)
ED TO INPATIENT HANDOFF REPORT  Name/Age/Gender Dawn [name] 80 y.o. female  Code Status    Code Status Orders  (From admission, onward)         Start     Ordered   12/04/20 2228  Full code  Continuous        12/04/20 2230        Code Status History    This patient has a current code status but no historical code status.   Advance Care Planning Activity    Advance Directive Documentation   Flowsheet Row Most Recent Value  Type of Advance Directive Living will, Healthcare Power of Attorney  Pre-existing out of facility DNR order (yellow form or pink MOST form) --  "MOST" Form in Place? --      Home/SNF/Other Home  Chief Complaint Symptomatic anemia [D64.9]  Level of Care/Admitting Diagnosis ED Disposition    ED Disposition Condition [place]: [place] [[id number]]  Level of Care: Telemetry [5]  Admit to tele based on following criteria: Other see comments  Comments: symptomatic anemia  Covid Evaluation: Asymptomatic Screening Protocol (No Symptoms)  Diagnosis: Symptomatic anemia [7169678]  Admitting Physician: Marcelyn Bruins [[id number]]  Attending Physician: Marcelyn Bruins [[id number]]       Medical History Past Medical History:  Diagnosis Date  . Anxiety   . Breast cancer of upper-outer quadrant of left female breast ([place]) 08/18/2015  . Broken wrist    when younger x2  . COPD (chronic obstructive pulmonary disease) [facility])    Hospitalization March 2011  . Depression   . Diabetes mellitus without complication ([place])   . Full dentures   . H/O vaginal delivery 1962   x1   . High cholesterol   . History of home oxygen therapy   . Hypertension   . Pneumonia   . Radiation    11/18/15- 12/16/15 to her Left Breast    Allergies Allergies  Allergen Reactions  . Pneumococcal Vaccines Hives and Swelling    Arm swelled up  . Prandin [Repaglinide] Other (See Comments)    Drops BS too low.    IV  Location/Drains/Wounds Patient Lines/Drains/Airways Status    Active Line/Drains/Airways    Name Placement date Placement time Site Days   Peripheral IV 12/04/20 Anterior;Proximal;Right Forearm 12/04/20  2130  Forearm  less than 1   Incision (Closed) 09/17/15 Breast Left 09/17/15  1203  -- 1905   Incision (Closed) 09/17/15 Axilla Left 09/17/15  1203  -- 1905          Labs/Imaging Results for orders placed or performed during the hospital encounter of 12/04/20 (from the past 48 hour(s))  CBG monitoring, ED     Status: Abnormal   Collection Time: 12/04/20  6:15 PM  Result Value Ref Range   Glucose-Capillary 133 (H) 70 - 99 mg/dL    Comment: Glucose reference range applies only to samples taken after fasting for at least 8 hours.  Basic metabolic panel     Status: Abnormal   Collection Time: 12/04/20  7:03 PM  Result Value Ref Range   Sodium 133 (L) 135 - 145 mmol/L   Potassium 4.0 3.5 - 5.1 mmol/L   Chloride 96 (L) 98 - 111 mmol/L   CO2 27 22 - 32 mmol/L   Glucose, Bld 151 (H) 70 - 99 mg/dL    Comment: Glucose reference range applies only to samples taken after fasting for at least 8 hours.  BUN 15 8 - 23 mg/dL   Creatinine, Ser 1.03 (H) 0.44 - 1.00 mg/dL   Calcium 9.4 8.9 - 10.3 mg/dL   GFR, Estimated 56 (L) >60 mL/min    Comment: (NOTE) Calculated using the CKD-EPI Creatinine Equation (2021)    Anion gap 10 5 - 15    Comment: Performed at [facility], [place] [address] [place], [place] [address]  CBC     Status: Abnormal   Collection Time: 12/04/20  7:03 PM  Result Value Ref Range   WBC 6.6 4.0 - 10.5 K/uL   RBC 3.28 (L) 3.87 - 5.11 MIL/uL   Hemoglobin 7.6 (L) 12.0 - 15.0 g/dL   HCT 26.2 (L) 36.0 - 46.0 %   MCV 79.9 (L) 80.0 - 100.0 fL   MCH 23.2 (L) 26.0 - 34.0 pg   MCHC 29.0 (L) 30.0 - 36.0 g/dL   RDW 19.7 (H) 11.5 - 15.5 %   Platelets 548 (H) 150 - 400 K/uL   nRBC 0.0 0.0 - 0.2 %    Comment: Performed at [facility], [place]  [address], [place], [place] [address]  Troponin I (High Sensitivity)     Status: None   Collection Time: 12/04/20  7:03 PM  Result Value Ref Range   Troponin I (High Sensitivity) 8 <18 ng/L    Comment: (NOTE) Elevated high sensitivity troponin I (hsTnI) values and significant  changes across serial measurements may suggest ACS but many other  chronic and acute conditions are known to elevate hsTnI results.  Refer to the "Links" section for chest pain algorithms and additional  guidance. Performed at [facility], [place] [address] [place], [place] [address]   Brain natriuretic peptide     Status: None   Collection Time: 12/04/20  7:03 PM  Result Value Ref Range   B Natriuretic Peptide 96.3 0.0 - 100.0 pg/mL    Comment: Performed at [facility], [place] [address], [place], [place] [address]  ABO/Rh     Status: None   Collection Time: 12/04/20  7:03 PM  Result Value Ref Range   ABO/RH(D)      O POS Performed at [facility], [place] [address], [place], [place] [address]   Type and screen     Status: None (Preliminary result)   Collection Time: 12/04/20  7:08 PM  Result Value Ref Range   ABO/RH(D) O POS    Antibody Screen NEG    Sample Expiration 12/07/2020,2359    Unit Number [id number]    Blood Component Type RED CELLS,LR    Unit division 00    Status of Unit ALLOCATED    Transfusion Status OK TO TRANSFUSE    Crossmatch Result      Compatible Performed at [place] [address], [place], [place] [address]    Unit Number [id number]    Blood Component Type RED CELLS,LR    Unit division 00    Status of Unit ALLOCATED    Transfusion Status OK TO TRANSFUSE    Crossmatch Result Compatible   Resp Panel by RT-PCR (Flu A&B, Covid) Nasopharyngeal Swab     Status: None   Collection Time: 12/04/20  7:08 PM   Specimen: Nasopharyngeal Swab; Nasopharyngeal(NP) swabs in vial transport medium  Result Value  Ref Range   SARS Coronavirus 2 by RT PCR NEGATIVE NEGATIVE    Comment: (NOTE) SARS-CoV-2 target nucleic acids are NOT DETECTED.  The SARS-CoV-2 RNA is  generally detectable in upper respiratory specimens during the acute phase of infection. The lowest concentration of SARS-CoV-2 viral copies this assay can detect is 138 copies/mL. A negative result does not preclude SARS-Cov-2 infection and should not be used as the sole basis for treatment or other patient management decisions. A negative result may occur with  improper specimen collection/handling, submission of specimen other than nasopharyngeal swab, presence of viral mutation(s) within the areas targeted by this assay, and inadequate number of viral copies(<138 copies/mL). A negative result must be combined with clinical observations, patient history, and epidemiological information. The expected result is Negative.  Fact Sheet for Patients:  EntrepreneurPulse.com.au  Fact Sheet for Healthcare Providers:  IncredibleEmployment.be  This test is no t yet approved or cleared by the [place] FDA and  has been authorized for detection and/or diagnosis of SARS-CoV-2 by FDA under an Emergency Use Authorization (EUA). This EUA will remain  in effect (meaning this test can be used) for the duration of the COVID-19 declaration under Section 564(b)(1) of the Act, 21 U.S.C.section 360bbb-3(b)(1), unless the authorization is terminated  or revoked sooner.       Influenza A by PCR NEGATIVE NEGATIVE   Influenza B by PCR NEGATIVE NEGATIVE    Comment: (NOTE) The Xpert Xpress SARS-CoV-2/FLU/RSV plus assay is intended as an aid in the diagnosis of influenza from Nasopharyngeal swab specimens and should not be used as a sole basis for treatment. Nasal washings and aspirates are unacceptable for Xpert Xpress SARS-CoV-2/FLU/RSV testing.  Fact Sheet for  Patients: EntrepreneurPulse.com.au  Fact Sheet for Healthcare Providers: IncredibleEmployment.be  This test is not yet approved or cleared by the [place] FDA and has been authorized for detection and/or diagnosis of SARS-CoV-2 by FDA under an Emergency Use Authorization (EUA). This EUA will remain in effect (meaning this test can be used) for the duration of the COVID-19 declaration under Section 564(b)(1) of the Act, 21 U.S.C. section 360bbb-3(b)(1), unless the authorization is terminated or revoked.  Performed at [facility], [place] [address], [place], [place] [address]   POC occult blood, ED Provider will collect     Status: None   Collection Time: 12/04/20  8:26 PM  Result Value Ref Range   Fecal Occult Bld NEGATIVE NEGATIVE  Prepare RBC (crossmatch)     Status: None   Collection Time: 12/04/20  8:56 PM  Result Value Ref Range   Order Confirmation      ORDER PROCESSED BY BLOOD BANK Performed at [facility], [place] [address] [place], [place] [address]   CBG monitoring, ED     Status: Abnormal   Collection Time: 12/04/20  9:48 PM  Result Value Ref Range   Glucose-Capillary 172 (H) 70 - 99 mg/dL    Comment: Glucose reference range applies only to samples taken after fasting for at least 8 hours.   DG Chest 2 View  Result Date: 12/04/2020 CLINICAL DATA:  COPD, shortness of breath EXAM: CHEST - 2 VIEW COMPARISON:  10/10/2012 FINDINGS: There is hyperinflation of the lungs compatible with COPD. Heart is mildly enlarged. Aortic atherosclerosis. Airspace opacity in the right mid lung. Left lung clear. No effusions or acute bony abnormality. IMPRESSION: COPD, cardiomegaly. Right mid lung airspace opacity could reflect pneumonia. Aortic atherosclerosis. Electronically Signed   By: Rolm Baptise M.D.   On: 12/04/2020 18:58    Pending Labs [employer] (From admission, onward)          Start     Ordered  12/05/20 0500  Comprehensive metabolic panel  Tomorrow morning,   R        12/04/20 2230   12/05/20 0500  CBC  Tomorrow morning,   R        12/04/20 2230   12/04/20 2228  Folate, serum, performed at [facility] lab  Add-on,   AD        12/04/20 2230   12/04/20 2228  Reticulocytes  Add-on,   AD        12/04/20 2230   12/04/20 2227  Iron and TIBC  Add-on,   AD        12/04/20 2230   12/04/20 2227  Ferritin  Add-on,   AD        12/04/20 2230   12/04/20 2227  Vitamin B12  Add-on,   AD        12/04/20 2230          Vitals/Pain Today's Vitals   12/04/20 1818 12/04/20 2030 12/04/20 2130 12/04/20 2251  BP:  (!) 157/54 135/60 (!) 149/46  Pulse:  71 72 90  Resp:  (!) 21 18 15   Temp:    98.2 F (36.8 C)  TempSrc:    Oral  SpO2:  100% 100% 99%  Weight: 61.2 kg     Height: 5\' 4"  (1.626 m)     PainSc: 0-No pain       Isolation Precautions Airborne and Contact precautions  Medications Medications  albuterol (VENTOLIN HFA) 108 (90 Base) MCG/ACT inhaler 2 puff (has no administration in time range)  quinapril (ACCUPRIL) tablet 40 mg (has no administration in time range)  sodium chloride flush (NS) 0.9 % injection 3 mL (3 mLs Intravenous Given 12/04/20 2249)  acetaminophen (TYLENOL) tablet 650 mg (has no administration in time range)    Or  acetaminophen (TYLENOL) suppository 650 mg (has no administration in time range)  albuterol (PROVENTIL) (2.5 MG/3ML) 0.083% nebulizer solution 2.5 mg (has no administration in time range)  polyethylene glycol (MIRALAX / GLYCOLAX) packet 17 g (has no administration in time range)  insulin aspart (novoLOG) injection 0-9 Units (has no administration in time range)  albuterol (PROVENTIL) (2.5 MG/3ML) 0.083% nebulizer solution 5 mg (5 mg Nebulization Given 12/04/20 2020)  ipratropium (ATROVENT) nebulizer solution 0.5 mg (0.5 mg Nebulization Given 12/04/20 2020)  0.9 %  sodium chloride infusion (Manually program via Guardrails IV Fluids) ( Intravenous New  Bag/Given 12/04/20 2147)  methylPREDNISolone sodium succinate (SOLU-MEDROL) 125 mg/2 mL injection 125 mg (125 mg Intravenous Given 12/04/20 2138)  albuterol (PROVENTIL) (2.5 MG/3ML) 0.083% nebulizer solution 5 mg (5 mg Nebulization Given 12/04/20 2142)  ipratropium (ATROVENT) nebulizer solution 0.5 mg (0.5 mg Nebulization Given 12/04/20 2142)  magnesium sulfate IVPB 2 g 50 mL (2 g Intravenous New Bag/Given 12/04/20 2142)  iohexol (OMNIPAQUE) 300 MG/ML solution 75 mL (75 mLs Intravenous Contrast Given 12/04/20 2214)    Mobility walks

## 2020-12-05 ENCOUNTER — Observation Stay (HOSPITAL_COMMUNITY): Payer: Medicare Other

## 2020-12-05 DIAGNOSIS — I342 Nonrheumatic mitral (valve) stenosis: Secondary | ICD-10-CM

## 2020-12-05 DIAGNOSIS — F17210 Nicotine dependence, cigarettes, uncomplicated: Secondary | ICD-10-CM | POA: Diagnosis not present

## 2020-12-05 DIAGNOSIS — Z7984 Long term (current) use of oral hypoglycemic drugs: Secondary | ICD-10-CM | POA: Diagnosis not present

## 2020-12-05 DIAGNOSIS — I10 Essential (primary) hypertension: Secondary | ICD-10-CM | POA: Diagnosis not present

## 2020-12-05 DIAGNOSIS — Z9012 Acquired absence of left breast and nipple: Secondary | ICD-10-CM | POA: Diagnosis not present

## 2020-12-05 DIAGNOSIS — G9389 Other specified disorders of brain: Secondary | ICD-10-CM | POA: Diagnosis not present

## 2020-12-05 DIAGNOSIS — J441 Chronic obstructive pulmonary disease with (acute) exacerbation: Secondary | ICD-10-CM | POA: Diagnosis not present

## 2020-12-05 DIAGNOSIS — Z79899 Other long term (current) drug therapy: Secondary | ICD-10-CM | POA: Diagnosis not present

## 2020-12-05 DIAGNOSIS — R413 Other amnesia: Secondary | ICD-10-CM | POA: Diagnosis not present

## 2020-12-05 DIAGNOSIS — K219 Gastro-esophageal reflux disease without esophagitis: Secondary | ICD-10-CM | POA: Diagnosis present

## 2020-12-05 DIAGNOSIS — Z853 Personal history of malignant neoplasm of breast: Secondary | ICD-10-CM | POA: Diagnosis not present

## 2020-12-05 DIAGNOSIS — D649 Anemia, unspecified: Secondary | ICD-10-CM

## 2020-12-05 DIAGNOSIS — E7800 Pure hypercholesterolemia, unspecified: Secondary | ICD-10-CM | POA: Diagnosis not present

## 2020-12-05 DIAGNOSIS — E11649 Type 2 diabetes mellitus with hypoglycemia without coma: Secondary | ICD-10-CM | POA: Diagnosis not present

## 2020-12-05 DIAGNOSIS — F32A Depression, unspecified: Secondary | ICD-10-CM | POA: Diagnosis present

## 2020-12-05 DIAGNOSIS — E785 Hyperlipidemia, unspecified: Secondary | ICD-10-CM | POA: Diagnosis present

## 2020-12-05 DIAGNOSIS — Z923 Personal history of irradiation: Secondary | ICD-10-CM | POA: Diagnosis not present

## 2020-12-05 DIAGNOSIS — R918 Other nonspecific abnormal finding of lung field: Secondary | ICD-10-CM | POA: Diagnosis not present

## 2020-12-05 DIAGNOSIS — R0602 Shortness of breath: Secondary | ICD-10-CM | POA: Diagnosis not present

## 2020-12-05 DIAGNOSIS — Z801 Family history of malignant neoplasm of trachea, bronchus and lung: Secondary | ICD-10-CM | POA: Diagnosis not present

## 2020-12-05 DIAGNOSIS — Z7982 Long term (current) use of aspirin: Secondary | ICD-10-CM | POA: Diagnosis not present

## 2020-12-05 DIAGNOSIS — Z888 Allergy status to other drugs, medicaments and biological substances status: Secondary | ICD-10-CM | POA: Diagnosis not present

## 2020-12-05 DIAGNOSIS — F419 Anxiety disorder, unspecified: Secondary | ICD-10-CM | POA: Diagnosis present

## 2020-12-05 DIAGNOSIS — D509 Iron deficiency anemia, unspecified: Secondary | ICD-10-CM | POA: Diagnosis not present

## 2020-12-05 DIAGNOSIS — Z20822 Contact with and (suspected) exposure to covid-19: Secondary | ICD-10-CM | POA: Diagnosis not present

## 2020-12-05 DIAGNOSIS — J9601 Acute respiratory failure with hypoxia: Secondary | ICD-10-CM | POA: Diagnosis not present

## 2020-12-05 LAB — COMPREHENSIVE METABOLIC PANEL
ALT: 9 U/L (ref 0–44)
AST: 20 U/L (ref 15–41)
Albumin: 3.3 g/dL — ABNORMAL LOW (ref 3.5–5.0)
Alkaline Phosphatase: 85 U/L (ref 38–126)
Anion gap: 13 (ref 5–15)
BUN: 15 mg/dL (ref 8–23)
CO2: 24 mmol/L (ref 22–32)
Calcium: 9.6 mg/dL (ref 8.9–10.3)
Chloride: 94 mmol/L — ABNORMAL LOW (ref 98–111)
Creatinine, Ser: 0.97 mg/dL (ref 0.44–1.00)
GFR, Estimated: 60 mL/min — ABNORMAL LOW (ref >60)
Glucose, Bld: 276 mg/dL — ABNORMAL HIGH (ref 70–99)
Potassium: 4.1 mmol/L (ref 3.5–5.1)
Sodium: 131 mmol/L — ABNORMAL LOW (ref 135–145)
Total Bilirubin: 1 mg/dL (ref 0.3–1.2)
Total Protein: 7.2 g/dL (ref 6.5–8.1)

## 2020-12-05 LAB — RETICULOCYTES
Immature Retic Fract: 24.4 % — ABNORMAL HIGH (ref 2.3–15.9)
RBC.: 4.25 MIL/uL (ref 3.87–5.11)
Retic Count, Absolute: 66.3 10*3/uL (ref 19.0–186.0)
Retic Ct Pct: 1.6 % (ref 0.4–3.1)

## 2020-12-05 LAB — CBC
HCT: 34.8 % — ABNORMAL LOW (ref 36.0–46.0)
Hemoglobin: 10.7 g/dL — ABNORMAL LOW (ref 12.0–15.0)
MCH: 24.8 pg — ABNORMAL LOW (ref 26.0–34.0)
MCHC: 30.7 g/dL (ref 30.0–36.0)
MCV: 80.6 fL (ref 80.0–100.0)
Platelets: 446 10*3/uL — ABNORMAL HIGH (ref 150–400)
RBC: 4.32 MIL/uL (ref 3.87–5.11)
RDW: 21 % — ABNORMAL HIGH (ref 11.5–15.5)
WBC: 5.6 10*3/uL (ref 4.0–10.5)
nRBC: 0 % (ref 0.0–0.2)

## 2020-12-05 LAB — GLUCOSE, CAPILLARY
Glucose-Capillary: 315 mg/dL — ABNORMAL HIGH (ref 70–99)
Glucose-Capillary: 326 mg/dL — ABNORMAL HIGH (ref 70–99)
Glucose-Capillary: 183 mg/dL — ABNORMAL HIGH (ref 70–99)
Glucose-Capillary: 205 mg/dL — ABNORMAL HIGH (ref 70–99)

## 2020-12-05 LAB — HEMOGLOBIN A1C
Hgb A1c MFr Bld: 6.5 % — ABNORMAL HIGH (ref 4.8–5.6)
Mean Plasma Glucose: 139.85 mg/dL

## 2020-12-05 LAB — ECHOCARDIOGRAM COMPLETE
Area-P 1/2: 2.17 cm2
Calc EF: 66.3 %
Height: 64 in
P 1/2 time: 96 msec
S' Lateral: 2.4 cm
Single Plane A2C EF: 66.1 %
Single Plane A4C EF: 64.1 %
Weight: 1947.1 oz

## 2020-12-05 LAB — TROPONIN I (HIGH SENSITIVITY)
Troponin I (High Sensitivity): 6 ng/L (ref <18)
Troponin I (High Sensitivity): 6 ng/L (ref <18)

## 2020-12-05 LAB — FOLATE: Folate: 15.2 ng/mL (ref >5.9)

## 2020-12-05 LAB — VITAMIN B12: Vitamin B-12: 216 pg/mL (ref 180–914)

## 2020-12-05 LAB — IRON AND TIBC
Iron: 86 ug/dL (ref 28–170)
Saturation Ratios: 18 % (ref 10.4–31.8)
TIBC: 469 ug/dL — ABNORMAL HIGH (ref 250–450)
UIBC: 383 ug/dL

## 2020-12-05 LAB — FERRITIN: Ferritin: 6 ng/mL — ABNORMAL LOW (ref 11–307)

## 2020-12-05 MED ORDER — NICOTINE 14 MG/24HR TD PT24
14.0000 mg | MEDICATED_PATCH | Freq: Every day | TRANSDERMAL | Status: DC
  Administered 2020-12-05 – 2020-12-06 (×2): 14 mg via TRANSDERMAL
  Filled 2020-12-05 (×2): qty 1

## 2020-12-05 MED ORDER — VERAPAMIL HCL ER 240 MG PO TBCR
240.0000 mg | EXTENDED_RELEASE_TABLET | Freq: Every day | ORAL | Status: DC
  Administered 2020-12-05 – 2020-12-06 (×2): 240 mg via ORAL
  Filled 2020-12-05 (×2): qty 1

## 2020-12-05 MED ORDER — DM-GUAIFENESIN ER 30-600 MG PO TB12
1.0000 | ORAL_TABLET | Freq: Two times a day (BID) | ORAL | Status: DC
  Administered 2020-12-05 – 2020-12-06 (×2): 1 via ORAL
  Filled 2020-12-05 (×2): qty 1

## 2020-12-05 MED ORDER — LISINOPRIL 20 MG PO TABS
40.0000 mg | ORAL_TABLET | Freq: Every day | ORAL | Status: DC
  Administered 2020-12-05 – 2020-12-06 (×2): 40 mg via ORAL
  Filled 2020-12-05 (×2): qty 2

## 2020-12-05 MED ORDER — IPRATROPIUM-ALBUTEROL 0.5-2.5 (3) MG/3ML IN SOLN
3.0000 mL | Freq: Three times a day (TID) | RESPIRATORY_TRACT | Status: DC
  Administered 2020-12-06: 3 mL via RESPIRATORY_TRACT
  Filled 2020-12-05: qty 3

## 2020-12-05 MED ORDER — PERFLUTREN LIPID MICROSPHERE
1.0000 mL | INTRAVENOUS | Status: AC | PRN
  Administered 2020-12-05: 2 mL via INTRAVENOUS
  Filled 2020-12-05: qty 10

## 2020-12-05 MED ORDER — SODIUM CHLORIDE 0.9 % IV SOLN
510.0000 mg | INTRAVENOUS | Status: DC
  Administered 2020-12-05: 510 mg via INTRAVENOUS
  Filled 2020-12-05: qty 510

## 2020-12-05 MED ORDER — DM-GUAIFENESIN ER 30-600 MG PO TB12: 1.0000 | ORAL_TABLET | Freq: Two times a day (BID) | ORAL | Status: DC

## 2020-12-05 MED ORDER — ALPRAZOLAM 0.5 MG PO TABS
0.5000 mg | ORAL_TABLET | Freq: Three times a day (TID) | ORAL | Status: DC | PRN
  Filled 2020-12-05: qty 1

## 2020-12-05 MED ORDER — ENSURE ENLIVE PO LIQD
237.0000 mL | Freq: Two times a day (BID) | ORAL | Status: DC
  Administered 2020-12-05 – 2020-12-06 (×2): 237 mL via ORAL

## 2020-12-05 MED ORDER — ORAL CARE MOUTH RINSE
15.0000 mL | Freq: Two times a day (BID) | OROMUCOSAL | Status: DC
  Administered 2020-12-05 – 2020-12-06 (×3): 15 mL via OROMUCOSAL

## 2020-12-05 MED ORDER — PENTOXIFYLLINE ER 400 MG PO TBCR
400.0000 mg | EXTENDED_RELEASE_TABLET | Freq: Three times a day (TID) | ORAL | Status: DC
  Administered 2020-12-05 – 2020-12-06 (×3): 400 mg via ORAL
  Filled 2020-12-05 (×4): qty 1

## 2020-12-05 MED ORDER — ANASTROZOLE 1 MG PO TABS
1.0000 mg | ORAL_TABLET | Freq: Every day | ORAL | Status: DC
  Administered 2020-12-05 – 2020-12-06 (×2): 1 mg via ORAL
  Filled 2020-12-05 (×2): qty 1

## 2020-12-05 NOTE — Consult Note (Addendum)
NAME:  Dawn [name], MRN:  [id number], DOB:  December 07, 1940, LOS: 0 ADMISSION DATE:  12/04/2020, CONSULTATION DATE:  3/25 REFERRING MD:  Dahal, CHIEF COMPLAINT:  Lung mass   History of Present Illness:  This is a 80 year old female w/ PMH as outlined below. Presented to the ED on 3/24 w/ cc weakness, shortness of breath, memory loss and decreased PO intake. She was taken to PCP on 3/24 w/ these complaints as had also had several EMS visits over the last couple of weeks for above issues. At her PCP blood work was obtained showing her hgb to have dropped from 10.5 to 7.6 so she was sent to ER. On evaluation of dyspnea a CXR was obtained this showed right mid lung opacification a CT of chest was then obtained to better describe CXR findings which showed: a large consolidative mass (6x3.3cm) involving the RML mass that extends from hilum to lateral pleural surface, pulm asked to eval because of these findings.   Pertinent  Medical History  Breast cancer(left s/p partial left mastectomy w/LN Bx and Xrt 2017, maintained on anastrozole), HTN, HL, COPD, anxiety, diabetes (type II), tobacco abuse (1ppd)  Significant Hospital Events: Including procedures, antibiotic start and stop dates in addition to other pertinent events   . 3/24 admitted for symptomatic anemia. Received 2 units PRBC. CT: chest consolidative mass (6x3.3cm) involving the RML mass that extends from hilum to lateral pleural surface . 3/25 pulm consulted.   Interim History / Subjective:  No distress. Resting comfortable   Objective   Blood pressure (Abnormal) 148/62, pulse 92, temperature 98 F (36.7 C), temperature source Oral, resp. rate 19, height 5\' 4"  (1.626 m), weight 55.2 kg, SpO2 97 %.        Intake/Output Summary (Last 24 hours) at 12/05/2020 0818 Last data filed at 12/05/2020 4235 Gross per 24 hour  Intake 990 ml  Output 350 ml  Net 640 ml   Filed Weights   12/04/20 1818 12/04/20 2331 12/05/20 0500  Weight: 61.2 kg 56 kg  55.2 kg    Examination: General: 80 year old WF resting in bed HENT: NCAT no JVD  Lungs: clear no accessory use currently 2 LPM Cardiovascular: RRR Abdomen: soft  Extremities: warm dry left fingers stained w/ nicotine  Neuro: awake, oriented. No focal def but has clear memory def. Had to repeat several statements we discussed GU: voids  Labs/imaging that I havepersonally reviewed   CT imaging personally reviewed. This showed a fairly large consolidative mass involving the RML. 1 cm pleural density along medial apex on right. Central airways patent. No PE  Resolved Hospital Problem list    Assessment & Plan:   RML lung mass.  Tobacco abuse  Probable COPD Exertional dyspnea  Memory loss Symptomatic anemia Left sided Chest pain w/ exertion H/o diabetes type II Remote left breast cancer    RML lung mass -highly suspicious for malignancy  Plan ECHO, cycle CEs 12 lead Would like cardiology clearance given left sided chest pain w/ exertion  Also MRI brain would be helpful given memory loss.  We have her scheduled for 130pm on 3/28  Best practice (evaluated daily)  Per primary   Labs   CBC: Recent Labs  Lab 12/04/20 1903  WBC 6.6  HGB 7.6*  HCT 26.2*  MCV 79.9*  PLT 548*    Basic Metabolic Panel: Recent Labs  Lab 12/04/20 1903  NA 133*  K 4.0  CL 96*  CO2 27  GLUCOSE 151*  BUN 15  CREATININE 1.03*  CALCIUM 9.4   GFR: Estimated Creatinine Clearance: 38.9 mL/min (A) (by C-G formula based on SCr of 1.03 mg/dL (H)). Recent Labs  Lab 12/04/20 1903  WBC 6.6    Liver Function Tests: No results for input(s): AST, ALT, ALKPHOS, BILITOT, PROT, ALBUMIN in the last 168 hours. No results for input(s): LIPASE, AMYLASE in the last 168 hours. No results for input(s): AMMONIA in the last 168 hours.  ABG No results found for: PHART, PCO2ART, PO2ART, HCO3, TCO2, ACIDBASEDEF, O2SAT   Coagulation Profile: No results for input(s): INR, PROTIME in the last 168  hours.  Cardiac Enzymes: No results for input(s): CKTOTAL, CKMB, CKMBINDEX, TROPONINI in the last 168 hours.  HbA1C: Hgb A1c MFr Bld  Date/Time Value Ref Range Status  09/09/2015 09:04 AM 8.9 (H) 4.8 - 5.6 % Final    Comment:    (NOTE)         Pre-diabetes: 5.7 - 6.4         Diabetes: >6.4         Glycemic control for adults with diabetes: <7.0   11/25/2009 11:00 AM (H) 4.6 - 6.1 % Final   6.6 (NOTE) The ADA recommends the following therapeutic goal for glycemic control related to Hgb A1c measurement: Goal of therapy: <6.5 Hgb A1c  Reference: American Diabetes Association: Clinical Practice Recommendations 2010, Diabetes Care, 2010, 33: (Suppl  1).    CBG: Recent Labs  Lab 12/04/20 1815 12/04/20 2148  GLUCAP 133* 172*    Review of Systems:   Review of Systems  Constitutional: Positive for malaise/fatigue. Negative for chills and fever.  HENT: Negative.   Eyes: Negative.   Respiratory: Positive for shortness of breath.   Cardiovascular: Positive for chest pain and leg swelling.       The chest pain has been w/ exertions. Goes away w/ rest. On-going for last couple of months. Happens about once a week  Gastrointestinal: Negative.   Genitourinary: Negative.   Musculoskeletal: Negative.   Skin: Negative.   Neurological: Negative.   Endo/Heme/Allergies: Negative.   Psychiatric/Behavioral: Negative.      Past Medical History:  She,  has a past medical history of Anxiety, Breast cancer of upper-outer quadrant of left female breast ([place]) (08/18/2015), Broken wrist, COPD (chronic obstructive pulmonary disease) ([place]), Depression, Diabetes mellitus without complication ([place]), Full dentures, H/O vaginal delivery (1962), High cholesterol, History of home oxygen therapy, Hypertension, Pneumonia, and Radiation.   Surgical History:   Past Surgical History:  Procedure Laterality Date  . ABDOMINAL HYSTERECTOMY    . ABDOMINAL HYSTERECTOMY    . APPENDECTOMY    . CHOLECYSTECTOMY     . EYE SURGERY     cataracts  . RADIOACTIVE SEED GUIDED PARTIAL MASTECTOMY WITH AXILLARY SENTINEL LYMPH NODE BIOPSY Left 09/17/2015   Procedure: RADIOACTIVE SEED LOCALIZATION LEFT BREAST LUMPECTOMY AND LEFT AXILLARY SENTINEL LYMPH NODE BIOPSY;  Surgeon: Excell Seltzer, MD;  Location: [facility];  Service: General;  Laterality: Left;  . TONSILLECTOMY       Social History:   reports that she has been smoking cigarettes. She has been smoking about 1.00 pack per day. She has never used smokeless tobacco. She reports current alcohol use. She reports that she does not use drugs.   Family History:  Her family history includes Colon cancer in her sister; Lung cancer in her sister.   Allergies Allergies  Allergen Reactions  . Pneumococcal Vaccines Hives and Swelling    Arm swelled up  .  Repaglinide Other (See Comments)    Drops BS too low. Other reaction(s): blood sugar drop     Home Medications  Prior to Admission medications   Medication Sig Start Date End Date Taking? Authorizing Provider  anastrozole (ARIMIDEX) 1 MG tablet Take 1 tablet (1 mg total) by mouth daily. 06/03/20  Yes Nicholas Lose, MD  aspirin EC 81 MG tablet Take 81 mg by mouth daily.   Yes [provider]  Cholecalciferol (VITAMIN D3) 5000 UNITS TABS Take 5,000 Units by mouth daily.   Yes [provider]  glipiZIDE (GLUCOTROL) 5 MG tablet Take 5 mg by mouth daily before breakfast.   Yes [provider]  metFORMIN (GLUCOPHAGE-XR) 500 MG 24 hr tablet Take 1,000 mg by mouth 2 (two) times daily. 06/10/15  Yes [provider]  pantoprazole (PROTONIX) 40 MG tablet Take 40 mg by mouth daily. 09/24/20  Yes [provider]  pentoxifylline (TRENTAL) 400 MG CR tablet Take 1 tablet (400 mg total) by mouth 3 (three) times daily with meals. 06/03/20  Yes Nicholas Lose, MD  pioglitazone (ACTOS) 45 MG tablet Take 45 mg by mouth daily.   Yes [provider]  quinapril (ACCUPRIL) 40 MG tablet  Take 40 mg by mouth daily.    Yes [provider]  rosuvastatin (CRESTOR) 40 MG tablet Take 40 mg by mouth daily.   Yes [provider]  verapamil (CALAN-SR) 240 MG CR tablet Take 240 mg by mouth daily. 06/10/15  Yes [provider]     Critical care time: NA       Erick Colace ACNP-BC [place] Pager # [phone] OR # [phone] if no answer

## 2020-12-05 NOTE — Progress Notes (Signed)
Pt alert and aware in bed. Pt states she wants to go home. But they want her to stay for some test on Monday. What she says she want is to go out and smoke a cigarette. The chaplain offered caring and supportive presence, and listening ear.

## 2020-12-05 NOTE — Progress Notes (Signed)
Inpatient Diabetes Program Recommendations  AACE/ADA: New Consensus Statement on Inpatient Glycemic Control (2015)  Target Ranges:  Prepandial:   less than 140 mg/dL      Peak postprandial:   less than 180 mg/dL (1-2 hours)      Critically ill patients:  140 - 180 mg/dL   Lab Results  Component Value Date   GLUCAP 326 (H) 12/05/2020   HGBA1C 8.9 (H) 09/09/2015    Review of Glycemic Control  Diabetes history: DM2 Outpatient Diabetes medications: Actos 45 mg QD, glipizide 5 mg QAM, metformin 1000 mg BID Current orders for Inpatient glycemic control: Novolog 0-9 units TID with meals  Inpatient Diabetes Program Recommendations:     Consider Lantus 6 units QHS  Secure text to MD.  Will follow.  Thank you. Lorenda Peck, RD, LDN, CDE Inpatient Diabetes Coordinator [phone]

## 2020-12-05 NOTE — Progress Notes (Signed)
I spoke to Pt's Son [name]. I explained CT results. The risks and benefits of bronchoscopy and our expected plan of care leading up to the bronchoscopy scheduled for Monday at 130 assuming no issues from cardiac standpoint or MRI brain He is expecting a call to provide phone consent   Erick Colace ACNP-BC [place] Pager # [phone] OR # [phone] if no answer

## 2020-12-05 NOTE — Progress Notes (Signed)
After start of IV iron patient complained of dizziness, lightheadedness, itching and SOB. Pt became mildly diaphoretic with visible increase in work of breathing. IV infusion stopped immediately. Vital and EKG obtained. MD notified. After IV iron was stopped, patients symptoms began to decrease over time.

## 2020-12-05 NOTE — Progress Notes (Signed)
PROGRESS NOTE  Dawn [name]  DOB: [birth year]  PCP: Lawerance Cruel, MD JSE:[id number]  DOA: 12/04/2020  LOS: 0 days   Chief Complaint  Patient presents with  . COPD  . Hypoglycemia   Brief narrative: Dawn [name] is a 80 y.o. female with PMH significant for breast cancer, hypertension, hyperlipidemia, COPD, anxiety, diabetes. Patient presented to the ED on 3/24 with intermittent shortness of breath and worsening weakness.  Also found to be anemic a PCP recently. In the ED, vital signs were stable. Labs showed hemoglobin low at 7.6. CT scan of chest showed right middle lobe masslike consolidation most concerning for malignancy.  Patient was admitted to hospitalist service.  Subjective: Patient was seen and examined this morning.  Elderly Caucasian female.  Lying down in bed.  On 3 L oxygen by nasal cannula. She states he is not able to think properly today.  She denies history of breast cancer but states she saw Dr. Lindi Adie in the past for something. Says 'may be I had cancer.' Smokes 1 pack/day.  Is craving for cigarettes this morning and wants to step out of the hospital for that. Chart reviewed  Assessment/Plan: Symptomatic anemia Severe iron deficiency -Baseline hemoglobin close to 10.  Presented with a low hemoglobin of 7.6.   -Hemoccult negative in ED.  No other active blood loss per history  -2 units PRBCs transfused in the ED. -Repeat hemoglobin showed an improvement to 10.7. -Ferritin level significant low at 6 suggestive of severe iron deficiency.  Will obtain FOBT and start on iron supplement. Recent Labs    12/04/20 1903 12/05/20 0956  HGB 7.6* 10.7*  MCV 79.9* 80.6  VITAMINB12  --  216  FOLATE  --  15.2  FERRITIN  --  6*  TIBC  --  469*  IRON  --  86  RETICCTPCT  --  1.6   Right lung mass -CT scan of chest on admission showed right middle lobe masslike consolidation concerning for malignancy.  -Pulmonary consult appreciated.  Plan for bronchoscopy biopsy  on Monday 3/28.  COPD -Symptoms stable at this time.  Continue bronchodilators.  Chronic everyday smoker -Smokes about 1 pack of cigarettes per day.  Counseled to quit.  Dyspnea on exertion -Probably related to COPD, chronic everyday smoking, underlying lung mass and low hemoglobin level. -No complaint of chest pain.  Troponin negative.  Obtain echocardiogram to look for any wall motion abnormalities.  If abnormal echocardiogram, will get cardiology evaluation prior to procedure.   Recent Labs    12/04/20 1903  TROPONINIHS 8   History of breast cancer -Follows up with Dr. Lindi Adie as an outpatient. -On anastrozole and pentoxifylline for breast cancer  Hypertension -On quinapril and verapamil at home.  -Resume both.  Continue to monitor blood pressure.    Diabetes mellitus Recent episodes of hypoglycemia at home -A1c 8.9 from 2016. Obtain updated A1c -Home meds include glipizide 5 mg daily, Actos 45 mg daily, Metformin 1000 mg twice daily. -Patient reports episodes of hypoglycemia at home, probably related to poor oral intake.   -Currently all oral meds are on hold.  Continue sliding scale insulin with Accu-Cheks. Lab Results  Component Value Date   HGBA1C 8.9 (H) 09/09/2015   Recent Labs  Lab 12/04/20 1815 12/04/20 2148 12/05/20 0818 12/05/20 1140  GLUCAP 133* 172* 315* 326*   Hyperlipidemia - Continue aspirin and rosuvastatin  GERD -Continue home PPI  Mobility: Encourage ambulation Code Status:   Code Status: Full Code  Nutritional  status: Body mass index is 20.89 kg/m.     Diet Order            Diet NPO time specified  Diet effective midnight           Diet heart healthy/carb modified Room service appropriate? Yes; Fluid consistency: Thin  Diet effective now                 DVT prophylaxis: SCDs Start: 12/04/20 2227   Antimicrobials:  None Fluid: None Consultants: Pulmonology Family Communication:  None  Status is: Observation  The patient  will require care spanning > 2 midnights and should be moved to inpatient because: Needs monitoring of hemoglobin, needs bronchoscopy  Dispo: The patient is from: Home              Anticipated d/c is to: Home              Patient currently is not medically stable to d/c.   Difficult to place patient No       Infusions:  . ferumoxytol      Scheduled Meds: . anastrozole  1 mg Oral Daily  . feeding supplement  237 mL Oral BID BM  . insulin aspart  0-9 Units Subcutaneous TID WC  . lisinopril  40 mg Oral Daily  . mouth rinse  15 mL Mouth Rinse BID  . nicotine  14 mg Transdermal Daily  . pantoprazole  40 mg Oral Daily  . pentoxifylline  400 mg Oral TID WC  . rosuvastatin  40 mg Oral Daily  . sodium chloride flush  3 mL Intravenous Q12H  . verapamil  240 mg Oral Daily    Antimicrobials: Anti-infectives (From admission, onward)   None      PRN meds: acetaminophen **OR** acetaminophen, albuterol, albuterol, ALPRAZolam, polyethylene glycol   Objective: Vitals:   12/05/20 0637 12/05/20 1307  BP: (!) 148/62 118/84  Pulse: 92 80  Resp: 19 16  Temp: 98 F (36.7 C) 97.8 F (36.6 C)  SpO2: 97% 99%    Intake/Output Summary (Last 24 hours) at 12/05/2020 1334 Last data filed at 12/05/2020 1000 Gross per 24 hour  Intake 1353 ml  Output 950 ml  Net 403 ml   Filed Weights   12/04/20 1818 12/04/20 2331 12/05/20 0500  Weight: 61.2 kg 56 kg 55.2 kg   Weight change:  Body mass index is 20.89 kg/m.   Physical Exam: General exam: Pleasant, elderly Caucasian female.  Not in distress Skin: No rashes, lesions or ulcers. HEENT: Atraumatic, normocephalic, no obvious bleeding Lungs: Diffuse and expiratory wheezing, no crackles CVS: Regular rate and rhythm, no murmur GI/Abd soft, nontender, nondistended, bowel sound present CNS: Alert, awake, oriented x3 Psychiatry: Mood appropriate Extremities: No pedal edema, no calf tenderness  Data Review: I have personally reviewed the  laboratory data and studies available.  Recent Labs  Lab 12/04/20 1903 12/05/20 0956  WBC 6.6 5.6  HGB 7.6* 10.7*  HCT 26.2* 34.8*  MCV 79.9* 80.6  PLT 548* 446*   Recent Labs  Lab 12/04/20 1903 12/05/20 0956  NA 133* 131*  K 4.0 4.1  CL 96* 94*  CO2 27 24  GLUCOSE 151* 276*  BUN 15 15  CREATININE 1.03* 0.97  CALCIUM 9.4 9.6    F/u labs ordered Unresulted Labs (From admission, onward)          Start     Ordered   12/05/20 1329  Hemoglobin A1c  Add-on,   AD  12/05/20 1328   Unscheduled  Occult blood card to lab, stool  As needed,   R      12/05/20 1326          Signed, Terrilee Croak, MD [facility] 12/05/2020

## 2020-12-05 NOTE — Progress Notes (Signed)
  Echocardiogram 2D Echocardiogram has been performed.  Dawn [name] 12/05/2020, 2:34 PM

## 2020-12-05 NOTE — Plan of Care (Signed)

## 2020-12-06 ENCOUNTER — Inpatient Hospital Stay (HOSPITAL_COMMUNITY): Payer: Medicare Other

## 2020-12-06 ENCOUNTER — Telehealth: Payer: Self-pay | Admitting: Internal Medicine

## 2020-12-06 LAB — TYPE AND SCREEN
ABO/RH(D): O POS
Antibody Screen: NEGATIVE
Unit division: 0
Unit division: 0

## 2020-12-06 LAB — BPAM RBC
Blood Product Expiration Date: 202204192359
Blood Product Expiration Date: 202204192359
ISSUE DATE / TIME: 202203242334
ISSUE DATE / TIME: 202203250310
Unit Type and Rh: 5100
Unit Type and Rh: 5100

## 2020-12-06 LAB — GLUCOSE, CAPILLARY
Glucose-Capillary: 258 mg/dL — ABNORMAL HIGH (ref 70–99)
Glucose-Capillary: 139 mg/dL — ABNORMAL HIGH (ref 70–99)

## 2020-12-06 MED ORDER — ALBUTEROL SULFATE HFA 108 (90 BASE) MCG/ACT IN AERS: 2.0000 | INHALATION_SPRAY | RESPIRATORY_TRACT | 2 refills | Status: DC | PRN

## 2020-12-06 MED ORDER — METFORMIN HCL ER 500 MG PO TB24: 500.0000 mg | ORAL_TABLET | Freq: Two times a day (BID) | ORAL | 2 refills | Status: DC

## 2020-12-06 MED ORDER — DM-GUAIFENESIN ER 30-600 MG PO TB12: 1.0000 | ORAL_TABLET | Freq: Two times a day (BID) | ORAL | 0 refills | Status: DC

## 2020-12-06 MED ORDER — GADOBUTROL 1 MMOL/ML IV SOLN
5.0000 mL | Freq: Once | INTRAVENOUS | Status: AC | PRN
  Administered 2020-12-06: 5 mL via INTRAVENOUS

## 2020-12-06 NOTE — Progress Notes (Signed)
Patient's O2 Sats 94% RA while resting in bed. Pt ambulated 180 feet in hallway, slightly unsteady but able to correct herself. Tolerated walk well. O2 Sats did not go below 91% RA with ambulation.

## 2020-12-06 NOTE — Progress Notes (Signed)
Patient discharged home as ordered. Son at bedside to take patient home. D/C paperwork explained and pt verbalized understanding. Pt A&O, able to dress herself. Belongings (cigarette and lighter) returned at D/C. Pt assisted to exit via W/C with assist from nursing staff.

## 2020-12-06 NOTE — Telephone Encounter (Signed)
Triage  .Dawn [name]] was set up for inpatient bronch/EBUS by Dr Angelica Chessman 12/08/20 Monday but she wants to go home 12/06/2020 Saturday  Plan   - arrange opd pet scan -> then followup RB (first choice since he already reviewed scan) or BI

## 2020-12-06 NOTE — Plan of Care (Signed)

## 2020-12-06 NOTE — Discharge Summary (Addendum)
Physician Discharge Summary  Dawn [name] GMW:[id number] DOB: Nov 28, 1940 DOA: 12/04/2020  PCP: Lawerance Cruel, MD  Admit date: 12/04/2020 Discharge date: 12/06/2020  Admitted From: Home Discharge disposition: Home   Code Status: Full Code  Diet Recommendation: Cardiac/diabetic diet  Discharge Diagnosis:   Principal Problem:   Symptomatic anemia Active Problems:   Essential hypertension, benign   Diabetes ([place])   COPD (chronic obstructive pulmonary disease) ([place])   Mass of right lung  Chief Complaint  Patient presents with  . COPD  . Hypoglycemia   Brief narrative: Dawn [name] is a 80 y.o. female with PMH significant for breast cancer, hypertension, hyperlipidemia, COPD, anxiety, diabetes. Patient presented to the ED on 3/24 with intermittent shortness of breath and worsening weakness.  Also found to be anemic a PCP recently. In the ED, vital signs were stable. Labs showed hemoglobin low at 7.6. CT scan of chest showed right middle lobe masslike consolidation most concerning for malignancy.  Patient was admitted to hospitalist service.  Subjective: Patient was seen and examined this morning.  Elderly Caucasian female.   Sitting up at the edge of the bed.  She was really upset that she is being held in the hospital.  She really wants to go out and smoke.  She tried that earlier in the room last night.   She really wants to go home today  Hospital course: Symptomatic anemia Severe iron deficiency -Baseline hemoglobin close to 10. Presented with a low hemoglobin of 7.6.   -Hemoccult negative in ED. No other active blood loss per history  -2 units PRBCs transfused in the ED. -Repeat hemoglobin showed an improvement to 10.7. -Ferritin level significant low at 6 suggestive of severe iron deficiency.  FOBT negative.  IV iron supplementation was tried but she got allergic reaction to it.   -Patient will follow up with hematology oncology as an outpatient. Recent Labs     12/04/20 1903 12/05/20 0956  HGB 7.6* 10.7*  MCV 79.9* 80.6  VITAMINB12  --  216  FOLATE  --  15.2  FERRITIN  --  6*  TIBC  --  469*  IRON  --  86  RETICCTPCT  --  1.6   Right lung mass -CT scan of chest on admission showed right middle lobe masslike consolidation concerning for malignancy.  -Pulmonary consult was obtained.  Patient was planned for a bronchoscopic biopsy on Monday 3/28.  However patient does not want to wait.  She is adamant to be discharged.  I had a long conversation with patient and her son this morning.  I discussed with pulmonary attending Dr. Chase Caller as well.  Patient will have an arrangement for outpatient bronchoscopy, may also need a PET scan.  I called back her son and he agrees with the plan.  Acute respiratory failure with hypoxia -Required 2 to 3 L of oxygen in the hospital. Ambulated on the hallway today without supplemental oxygen and was able to maintain oxygen saturation more than 90%.  COPD -Symptoms stable at this time. Continue bronchodilators.  Chronic everyday smoker -Smokes about 1 pack of cigarettes per day.  Counseled to quit.  Dyspnea on exertion -Probably related to COPD, chronic everyday smoking, underlying lung mass and low hemoglobin level. -No complaint of chest pain.  Troponin negative.  Echocardiogram with normal EF and no wall motion abnormality.   Recent Labs    12/04/20 1903 12/05/20 1547 12/05/20 1834  TROPONINIHS 8 6 6    History of breast cancer -Follows  up with Dr. Lindi Adie as an outpatient. -On anastrozole and pentoxifylline for breast cancer  Hypertension -On quinapril and verapamil at home.  -Resume both post discharge.  Continue to monitor blood pressure at home.   Diabetes mellitus Recent episodes of hypoglycemia at home -A1c 6.5 on 12/05/2020 -Home meds include glipizide 5 mg daily, Actos 45 mg daily, Metformin 1000 mg twice daily. -Patient reports episodes of hypoglycemia at home, probably related to poor  oral intake.   -Her oral medications were held. -I will discharge her on readjusted regimen with glipizide 5 mg daily and Metformin 500 mg twice daily.  Stop Actos. Lab Results  Component Value Date   HGBA1C 6.5 (H) 12/05/2020   Recent Labs  Lab 12/05/20 0818 12/05/20 1140 12/05/20 1626 12/05/20 2116 12/06/20 0742  GLUCAP 315* 326* 183* 205* 258*   Hyperlipidemia - Continue aspirin and rosuvastatin  GERD -Continue home PPI   Wound care: Incision (Closed) 09/17/15 Breast Left (Active)  Date First Assessed/Time First Assessed: 09/17/15 1203   Location: Breast  Location Orientation: Left    Assessments 09/17/2015 12:28 PM 09/17/2015  2:03 PM  Dressing Type Liquid skin adhesive Liquid skin adhesive  Dressing Clean;Dry;Intact Clean;Dry;Intact  Site / Wound Assessment Clean;Dry --  Margins Attached edges (approximated) --  Closure Skin glue --  Drainage Amount None None     No Linked orders to display     Incision (Closed) 09/17/15 Axilla Left (Active)  Date First Assessed/Time First Assessed: 09/17/15 1203   Location: Axilla  Location Orientation: Left    Assessments 09/17/2015 12:28 PM 09/17/2015  2:03 PM  Dressing Type Liquid skin adhesive Liquid skin adhesive  Dressing Clean;Dry;Intact Clean;Dry;Intact  Site / Wound Assessment Clean;Dry --  Margins Attached edges (approximated) --  Closure Skin glue --  Drainage Amount None None     No Linked orders to display    Discharge Exam:   Vitals:   12/05/20 2204 12/06/20 0500 12/06/20 0603 12/06/20 0811  BP:   (!) 139/54   Pulse:   79   Resp:   18   Temp:   97.9 F (36.6 C)   TempSrc:   Oral   SpO2: 96%  94% 96%  Weight:      Height:  5\' 4"  (1.626 m)      Body mass index is 20.89 kg/m.  General exam: Elderly Caucasian female.  Sitting up at the edge of the bed.  Not in physical distress Skin: No rashes, lesions or ulcers. HEENT: Atraumatic, normocephalic, no obvious bleeding Lungs: End expiratory wheezing  bilaterally, no crackles CVS: Regular rate and rhythm, no murmur GI/Abd soft, nontender, nondistended, bowel sound present CNS: Alert, awake, oriented x3 Psychiatry: Mood appropriate Extremities: No pedal edema, no calf tenderness  Follow ups:   Discharge Instructions    Ambulatory referral to Pulmonology   Complete by: As directed    Dr. Lamonte Sakai or Dr. Valeta Harms   Reason for referral: Lung Mass/Lung Nodule   Diet - low sodium heart healthy   Complete by: As directed    Diet general   Complete by: As directed    Increase activity slowly   Complete by: As directed       Follow-up Information    Lawerance Cruel, MD Follow up.   Specialty: Family Medicine Contact information: [address] [place] [address] [phone]        Collene Gobble, MD Follow up.   Specialty: Pulmonary Disease Contact information: [place]  [address] [place] [address] [phone]               Recommendations for Outpatient Follow-Up:   1. Follow-up with PCP as an outpatient  Discharge Instructions:  Follow with Primary MD Lawerance Cruel, MD in 7 days   Get CBC/BMP checked in next visit within 1 week by PCP or SNF MD ( we routinely change or add medications that can affect your baseline labs and fluid status, therefore we recommend that you get the mentioned basic workup next visit with your PCP, your PCP may decide not to get them or add new tests based on their clinical decision)  On your next visit with your PCP, please Get Medicines reviewed and adjusted.  Please request your PCP  to go over all Hospital Tests and Procedure/Radiological results at the follow up, please get all Hospital records sent to your Prim MD by signing hospital release before you go home.  Activity: As tolerated with Full fall precautions use walker/cane & assistance as needed  For Heart failure patients - Check your Weight same time everyday, if you gain over 2 pounds, or you  develop in leg swelling, experience more shortness of breath or chest pain, call your Primary MD immediately. Follow Cardiac Low Salt Diet and 1.5 lit/day fluid restriction.  If you have smoked or chewed Tobacco in the last 2 yrs please stop smoking, stop any regular Alcohol  and or any Recreational drug use.  If you experience worsening of your admission symptoms, develop shortness of breath, life threatening emergency, suicidal or homicidal thoughts you must seek medical attention immediately by calling 911 or calling your MD immediately  if symptoms less severe.  You Must read complete instructions/literature along with all the possible adverse reactions/side effects for all the Medicines you take and that have been prescribed to you. Take any new Medicines after you have completely understood and accpet all the possible adverse reactions/side effects.   Do not drive, operate heavy machinery, perform activities at heights, swimming or participation in water activities or provide baby sitting services if your were admitted for syncope or siezures until you have seen by Primary MD or a Neurologist and advised to do so again.  Do not drive when taking Pain medications.  Do not take more than prescribed Pain, Sleep and Anxiety Medications  Wear Seat belts while driving.   Please note You were cared for by a hospitalist during your hospital stay. If you have any questions about your discharge medications or the care you received while you were in the hospital after you are discharged, you can call the unit and asked to speak with the hospitalist on call if the hospitalist that took care of you is not available. Once you are discharged, your primary care physician will handle any further medical issues. Please note that NO REFILLS for any discharge medications will be authorized once you are discharged, as it is imperative that you return to your primary care physician (or establish a relationship with  a primary care physician if you do not have one) for your aftercare needs so that they can reassess your need for medications and monitor your lab values.    Allergies as of 12/06/2020      Reactions   Ferumoxytol Shortness Of Breath, Itching, Other (See Comments)   Pt reported lightheadedness, a flushed feeling, SOB, and itching.     Pneumococcal Vaccines Hives, Swelling   Arm swelled up   Repaglinide Other (See Comments)  Drops BS too low. Other reaction(s): blood sugar drop      Medication List    STOP taking these medications   pioglitazone 45 MG tablet Commonly known as: ACTOS     TAKE these medications   albuterol 108 (90 Base) MCG/ACT inhaler Commonly known as: VENTOLIN HFA Inhale 2 puffs into the lungs every 2 (two) hours as needed for wheezing or shortness of breath.   anastrozole 1 MG tablet Commonly known as: ARIMIDEX Take 1 tablet (1 mg total) by mouth daily.   aspirin EC 81 MG tablet Take 81 mg by mouth daily.   dextromethorphan-guaiFENesin 30-600 MG 12hr tablet Commonly known as: MUCINEX DM Take 1 tablet by mouth 2 (two) times daily.   glipiZIDE 5 MG tablet Commonly known as: GLUCOTROL Take 5 mg by mouth daily before breakfast.   metFORMIN 500 MG 24 hr tablet Commonly known as: GLUCOPHAGE-XR Take 1 tablet (500 mg total) by mouth 2 (two) times daily. What changed: how much to take   pantoprazole 40 MG tablet Commonly known as: PROTONIX Take 40 mg by mouth daily.   pentoxifylline 400 MG CR tablet Commonly known as: TRENTAL Take 1 tablet (400 mg total) by mouth 3 (three) times daily with meals.   quinapril 40 MG tablet Commonly known as: ACCUPRIL Take 40 mg by mouth daily.   rosuvastatin 40 MG tablet Commonly known as: CRESTOR Take 40 mg by mouth daily.   verapamil 240 MG CR tablet Commonly known as: CALAN-SR Take 240 mg by mouth daily.   Vitamin D3 125 MCG (5000 UT) Tabs Take 5,000 Units by mouth daily.       Time coordinating  discharge: 35 minutes  The results of significant diagnostics from this hospitalization (including imaging, microbiology, ancillary and laboratory) are listed below for reference.    Procedures and Diagnostic Studies:   DG Chest 2 View  Result Date: 12/04/2020 CLINICAL DATA:  COPD, shortness of breath EXAM: CHEST - 2 VIEW COMPARISON:  10/10/2012 FINDINGS: There is hyperinflation of the lungs compatible with COPD. Heart is mildly enlarged. Aortic atherosclerosis. Airspace opacity in the right mid lung. Left lung clear. No effusions or acute bony abnormality. IMPRESSION: COPD, cardiomegaly. Right mid lung airspace opacity could reflect pneumonia. Aortic atherosclerosis. Electronically Signed   By: Rolm Baptise M.D.   On: 12/04/2020 18:58   CT Chest W Contrast  Result Date: 12/04/2020 CLINICAL DATA:  80 year old female with shortness of breath. EXAM: CT CHEST WITH CONTRAST TECHNIQUE: Multidetector CT imaging of the chest was performed during intravenous contrast administration. CONTRAST:  9mL OMNIPAQUE IOHEXOL 300 MG/ML  SOLN COMPARISON:  Chest radiograph dated 12/04/2020. FINDINGS: Cardiovascular: There is no cardiomegaly or pericardial effusion. There is coronary vascular calcification. There is advanced atherosclerotic calcification of the thoracic aorta. No aneurysmal dilatation. The origins of the great vessels of the aortic arch appear patent. No pulmonary artery embolus identified. Mediastinum/Nodes: No definite hilar or mediastinal adenopathy. The esophagus is grossly unremarkable. No mediastinal fluid collection. Lungs/Pleura: There is a 6.0 x 3.3 cm low attenuating right middle lobe masslike consolidation extending from the right hilum to the lateral pleural surface along the fissure most concerning for malignancy. Multidisciplinary consult is advised. There is background of mild centrilobular emphysema. A 1 cm pleural base nodular density along the medial right apex, likely scarring (27/7).  There is no pleural effusion pneumothorax. The central airways remain patent. Upper Abdomen: Cholecystectomy. Small scattered calcified splenic granuloma. Probable bilateral renal cysts. Musculoskeletal: Osteopenia with degenerative changes of the  spine. No acute osseous pathology. IMPRESSION: 1. No CT evidence of pulmonary artery embolus. 2. Right middle lobe masslike consolidation most concerning for malignancy. Multidisciplinary consult is advised. 3. Aortic Atherosclerosis (ICD10-I70.0) and Emphysema (ICD10-J43.9). Electronically Signed   By: Anner Crete M.D.   On: 12/04/2020 22:56   ECHOCARDIOGRAM COMPLETE  Result Date: 12/05/2020    ECHOCARDIOGRAM REPORT   Patient Name:   Dawn [name] Date of Exam: 12/05/2020 Medical Rec #:  [id number]   Height:       64.0 in Accession #:    [id number]  Weight:       121.7 lb Date of Birth:  10/24/1940  BSA:          1.584 m Patient Age:    68 years    BP:           118/84 mmHg Patient Gender: F           HR:           75 bpm. Exam Location:  Inpatient Procedure: 2D Echo, Cardiac Doppler, Color Doppler and Intracardiac            Opacification Agent Indications:    R06.02 SOB  History:        Patient has no prior history of Echocardiogram examinations.                 COPD; Risk Factors:Hypertension, Current Smoker and                 Dyslipidemia. Breast cancer. Lung mass.  Sonographer:    Roseanna Rainbow RDCS Referring Phys: [id number] [facility]  Sonographer Comments: Technically difficult study due to poor echo windows. Suboptimal machine. IMPRESSIONS  1. Left ventricular ejection fraction, by estimation, is 60 to 65%. The left ventricle has normal function. The left ventricle has no regional wall motion abnormalities. Left ventricular diastolic parameters are consistent with Grade I diastolic dysfunction (impaired relaxation). Elevated left atrial pressure.  2. Right ventricular systolic function is normal. The right ventricular size is normal. Tricuspid regurgitation  signal is inadequate for assessing PA pressure.  3. The mitral valve is normal in structure. No evidence of mitral valve regurgitation. Mild mitral stenosis. The mean mitral valve gradient is 3.5 mmHg. Moderate mitral annular calcification.  4. The aortic valve is normal in structure. Aortic valve regurgitation is not visualized. Mild aortic valve sclerosis is present, with no evidence of aortic valve stenosis.  5. The inferior vena cava is normal in size with greater than 50% respiratory variability, suggesting right atrial pressure of 3 mmHg. FINDINGS  Left Ventricle: Left ventricular ejection fraction, by estimation, is 60 to 65%. The left ventricle has normal function. The left ventricle has no regional wall motion abnormalities. Definity contrast agent was given IV to delineate the left ventricular  endocardial borders. The left ventricular internal cavity size was normal in size. There is no left ventricular hypertrophy. Left ventricular diastolic parameters are consistent with Grade I diastolic dysfunction (impaired relaxation). Elevated left atrial pressure. Right Ventricle: The right ventricular size is normal. No increase in right ventricular wall thickness. Right ventricular systolic function is normal. Tricuspid regurgitation signal is inadequate for assessing PA pressure. Left Atrium: Left atrial size was normal in size. Right Atrium: Right atrial size was normal in size. Pericardium: There is no evidence of pericardial effusion. Mitral Valve: The mitral valve is normal in structure. Moderate mitral annular calcification. No evidence of mitral valve regurgitation. Mild mitral valve stenosis. MV peak gradient,  10.4 mmHg. The mean mitral valve gradient is 3.5 mmHg with average heart rate of 75 bpm. Tricuspid Valve: The tricuspid valve is normal in structure. Tricuspid valve regurgitation is not demonstrated. No evidence of tricuspid stenosis. Aortic Valve: The aortic valve is normal in structure. Aortic  valve regurgitation is not visualized. Mild aortic valve sclerosis is present, with no evidence of aortic valve stenosis. Pulmonic Valve: The pulmonic valve was normal in structure. Pulmonic valve regurgitation is not visualized. No evidence of pulmonic stenosis. Aorta: The aortic root is normal in size and structure. Venous: The inferior vena cava is normal in size with greater than 50% respiratory variability, suggesting right atrial pressure of 3 mmHg. IAS/Shunts: No atrial level shunt detected by color flow Doppler.  LEFT VENTRICLE PLAX 2D LVIDd:         3.67 cm     Diastology LVIDs:         2.40 cm     LV e' medial:    6.85 cm/s LV PW:         1.30 cm     LV E/e' medial:  17.4 LV IVS:        1.09 cm     LV e' lateral:   5.87 cm/s LVOT diam:     1.70 cm     LV E/e' lateral: 20.3 LV SV:         52 LV SV Index:   33 LVOT Area:     2.27 cm  LV Volumes (MOD) LV vol d, MOD A2C: 88.0 ml LV vol d, MOD A4C: 51.3 ml LV vol s, MOD A2C: 29.8 ml LV vol s, MOD A4C: 18.4 ml LV SV MOD A2C:     58.2 ml LV SV MOD A4C:     51.3 ml LV SV MOD BP:      47.2 ml RIGHT VENTRICLE             IVC RV S prime:     10.90 cm/s  IVC diam: 1.82 cm TAPSE (M-mode): 2.2 cm LEFT ATRIUM         Index LA diam:    3.00 cm 1.89 cm/m  AORTIC VALVE LVOT Vmax:   120.00 cm/s LVOT Vmean:  77.800 cm/s LVOT VTI:    0.230 m  AORTA Ao Root diam: 2.40 cm Ao Asc diam:  3.00 cm MITRAL VALVE MV Area (PHT): 2.17 cm     SHUNTS MV Peak grad:  10.4 mmHg    Systemic VTI:  0.23 m MV Mean grad:  3.5 mmHg     Systemic Diam: 1.70 cm MV Vmax:       1.62 m/s MV Vmean:      84.7 cm/s MV PHT:        95.98 msec MV Decel Time: 349 msec MV E velocity: 119.00 cm/s MV A velocity: 143.00 cm/s MV E/A ratio:  0.83 Mihai Croitoru MD Electronically signed by Sanda Klein MD Signature Date/Time: 12/05/2020/4:18:45 PM    Final      Labs:   Basic Metabolic Panel: Recent Labs  Lab 12/04/20 1903 12/05/20 0956  NA 133* 131*  K 4.0 4.1  CL 96* 94*  CO2 27 24  GLUCOSE 151*  276*  BUN 15 15  CREATININE 1.03* 0.97  CALCIUM 9.4 9.6   GFR Estimated Creatinine Clearance: 41.3 mL/min (by C-G formula based on SCr of 0.97 mg/dL). Liver Function Tests: Recent Labs  Lab 12/05/20 0956  AST 20  ALT 9  ALKPHOS 85  BILITOT 1.0  PROT 7.2  ALBUMIN 3.3*   No results for input(s): LIPASE, AMYLASE in the last 168 hours. No results for input(s): AMMONIA in the last 168 hours. Coagulation profile No results for input(s): INR, PROTIME in the last 168 hours.  CBC: Recent Labs  Lab 12/04/20 1903 12/05/20 0956  WBC 6.6 5.6  HGB 7.6* 10.7*  HCT 26.2* 34.8*  MCV 79.9* 80.6  PLT 548* 446*   Cardiac Enzymes: No results for input(s): CKTOTAL, CKMB, CKMBINDEX, TROPONINI in the last 168 hours. BNP: Invalid input(s): POCBNP CBG: Recent Labs  Lab 12/05/20 0818 12/05/20 1140 12/05/20 1626 12/05/20 2116 12/06/20 0742  GLUCAP 315* 326* 183* 205* 258*   D-Dimer No results for input(s): DDIMER in the last 72 hours. Hgb A1c Recent Labs    12/05/20 0956  HGBA1C 6.5*   Lipid Profile No results for input(s): CHOL, HDL, LDLCALC, TRIG, CHOLHDL, LDLDIRECT in the last 72 hours. Thyroid function studies No results for input(s): TSH, T4TOTAL, T3FREE, THYROIDAB in the last 72 hours.  Invalid input(s): FREET3 Anemia work up Recent Labs    12/05/20 0956  VITAMINB12 216  FOLATE 15.2  FERRITIN 6*  TIBC 469*  IRON 86  RETICCTPCT 1.6   Microbiology Recent Results (from the past 240 hour(s))  Resp Panel by RT-PCR (Flu A&B, Covid) Nasopharyngeal Swab     Status: None   Collection Time: 12/04/20  7:08 PM   Specimen: Nasopharyngeal Swab; Nasopharyngeal(NP) swabs in vial transport medium  Result Value Ref Range Status   SARS Coronavirus 2 by RT PCR NEGATIVE NEGATIVE Final    Comment: (NOTE) SARS-CoV-2 target nucleic acids are NOT DETECTED.  The SARS-CoV-2 RNA is generally detectable in upper respiratory specimens during the acute phase of infection. The  lowest concentration of SARS-CoV-2 viral copies this assay can detect is 138 copies/mL. A negative result does not preclude SARS-Cov-2 infection and should not be used as the sole basis for treatment or other patient management decisions. A negative result may occur with  improper specimen collection/handling, submission of specimen other than nasopharyngeal swab, presence of viral mutation(s) within the areas targeted by this assay, and inadequate number of viral copies(<138 copies/mL). A negative result must be combined with clinical observations, patient history, and epidemiological information. The expected result is Negative.  Fact Sheet for Patients:  EntrepreneurPulse.com.au  Fact Sheet for Healthcare Providers:  IncredibleEmployment.be  This test is no t yet approved or cleared by the [place] FDA and  has been authorized for detection and/or diagnosis of SARS-CoV-2 by FDA under an Emergency Use Authorization (EUA). This EUA will remain  in effect (meaning this test can be used) for the duration of the COVID-19 declaration under Section 564(b)(1) of the Act, 21 U.S.C.section 360bbb-3(b)(1), unless the authorization is terminated  or revoked sooner.       Influenza A by PCR NEGATIVE NEGATIVE Final   Influenza B by PCR NEGATIVE NEGATIVE Final    Comment: (NOTE) The Xpert Xpress SARS-CoV-2/FLU/RSV plus assay is intended as an aid in the diagnosis of influenza from Nasopharyngeal swab specimens and should not be used as a sole basis for treatment. Nasal washings and aspirates are unacceptable for Xpert Xpress SARS-CoV-2/FLU/RSV testing.  Fact Sheet for Patients: EntrepreneurPulse.com.au  Fact Sheet for Healthcare Providers: IncredibleEmployment.be  This test is not yet approved or cleared by the [place] FDA and has been authorized for detection and/or diagnosis of SARS-CoV-2 by FDA under  an Emergency Use Authorization (EUA). This EUA will remain in  effect (meaning this test can be used) for the duration of the COVID-19 declaration under Section 564(b)(1) of the Act, 21 U.S.C. section 360bbb-3(b)(1), unless the authorization is terminated or revoked.  Performed at [facility], [place] [address] [place], [place] [address]      Signed: Marlowe Aschoff Para Cossey  [facility] 12/06/2020, 11:20 AM

## 2020-12-06 NOTE — Progress Notes (Signed)
Unable to get patient's daily weight this morning. Passed along to day shift nurse.

## 2020-12-06 NOTE — Progress Notes (Signed)
PT Cancellation Note  Patient Details Name: SHALAE BELMONTE MRN: [id number] DOB: [birth year]   Cancelled Treatment:     PT order received but eval deferred this am - pt for MRI.  Will follow.   Alyrica Thurow 12/06/2020, 8:41 AM

## 2020-12-08 SURGERY — BRONCHOSCOPY, WITH FLUOROSCOPY
Anesthesia: Monitor Anesthesia Care

## 2020-12-08 NOTE — Telephone Encounter (Signed)
Called and spoke with patient, advised of need to schedule her to Dr. Lamonte Sakai in the office as a consult.  Scheduled for 12/17/20 at 9:30 am, advised to arrive at 9:15 am for check in and to fill out new patient paperwork.  Used hold slot per Dr. Lamonte Sakai.  Provided patient with address and phone number for the office.  Nothing further needed.

## 2020-12-08 NOTE — Telephone Encounter (Signed)
I spoke with the patient this morning.  She is not planning for bronchoscopy today as an outpatient so I will cancel this with endoscopy.  She does need appointment with me to plan next steps, okay to use blocked nodule slot to get her scheduled ASAP.  Thank you

## 2020-12-11 ENCOUNTER — Emergency Department (HOSPITAL_COMMUNITY): Payer: Medicare Other

## 2020-12-11 ENCOUNTER — Other Ambulatory Visit: Payer: Self-pay

## 2020-12-11 ENCOUNTER — Inpatient Hospital Stay (HOSPITAL_COMMUNITY)
Admission: EM | Admit: 2020-12-11 | Discharge: 2020-12-14 | DRG: 190 | Disposition: A | Discharge status: Home Health | Payer: Medicare Other | Attending: Family Medicine | Admitting: Family Medicine

## 2020-12-11 ENCOUNTER — Encounter (HOSPITAL_COMMUNITY): Payer: Self-pay

## 2020-12-11 DIAGNOSIS — I152 Hypertension secondary to endocrine disorders: Secondary | ICD-10-CM | POA: Diagnosis present

## 2020-12-11 DIAGNOSIS — Z887 Allergy status to serum and vaccine status: Secondary | ICD-10-CM

## 2020-12-11 DIAGNOSIS — Z743 Need for continuous supervision: Secondary | ICD-10-CM | POA: Diagnosis not present

## 2020-12-11 DIAGNOSIS — Z8701 Personal history of pneumonia (recurrent): Secondary | ICD-10-CM

## 2020-12-11 DIAGNOSIS — Z90710 Acquired absence of both cervix and uterus: Secondary | ICD-10-CM | POA: Diagnosis not present

## 2020-12-11 DIAGNOSIS — Z888 Allergy status to other drugs, medicaments and biological substances status: Secondary | ICD-10-CM

## 2020-12-11 DIAGNOSIS — R404 Transient alteration of awareness: Secondary | ICD-10-CM | POA: Diagnosis not present

## 2020-12-11 DIAGNOSIS — Z720 Tobacco use: Secondary | ICD-10-CM | POA: Diagnosis present

## 2020-12-11 DIAGNOSIS — E1169 Type 2 diabetes mellitus with other specified complication: Secondary | ICD-10-CM | POA: Diagnosis present

## 2020-12-11 DIAGNOSIS — Z20822 Contact with and (suspected) exposure to covid-19: Secondary | ICD-10-CM | POA: Diagnosis present

## 2020-12-11 DIAGNOSIS — J441 Chronic obstructive pulmonary disease with (acute) exacerbation: Secondary | ICD-10-CM | POA: Diagnosis not present

## 2020-12-11 DIAGNOSIS — Z79899 Other long term (current) drug therapy: Secondary | ICD-10-CM | POA: Diagnosis not present

## 2020-12-11 DIAGNOSIS — R531 Weakness: Secondary | ICD-10-CM

## 2020-12-11 DIAGNOSIS — R6889 Other general symptoms and signs: Secondary | ICD-10-CM | POA: Diagnosis not present

## 2020-12-11 DIAGNOSIS — Z7982 Long term (current) use of aspirin: Secondary | ICD-10-CM | POA: Diagnosis not present

## 2020-12-11 DIAGNOSIS — Z972 Presence of dental prosthetic device (complete) (partial): Secondary | ICD-10-CM | POA: Diagnosis not present

## 2020-12-11 DIAGNOSIS — E782 Mixed hyperlipidemia: Secondary | ICD-10-CM | POA: Diagnosis present

## 2020-12-11 DIAGNOSIS — R918 Other nonspecific abnormal finding of lung field: Secondary | ICD-10-CM | POA: Diagnosis present

## 2020-12-11 DIAGNOSIS — J9601 Acute respiratory failure with hypoxia: Secondary | ICD-10-CM | POA: Diagnosis not present

## 2020-12-11 DIAGNOSIS — J439 Emphysema, unspecified: Principal | ICD-10-CM | POA: Diagnosis present

## 2020-12-11 DIAGNOSIS — R0602 Shortness of breath: Secondary | ICD-10-CM | POA: Diagnosis not present

## 2020-12-11 DIAGNOSIS — T380X5A Adverse effect of glucocorticoids and synthetic analogues, initial encounter: Secondary | ICD-10-CM | POA: Diagnosis present

## 2020-12-11 DIAGNOSIS — Z79811 Long term (current) use of aromatase inhibitors: Secondary | ICD-10-CM

## 2020-12-11 DIAGNOSIS — I499 Cardiac arrhythmia, unspecified: Secondary | ICD-10-CM | POA: Diagnosis not present

## 2020-12-11 DIAGNOSIS — F17210 Nicotine dependence, cigarettes, uncomplicated: Secondary | ICD-10-CM | POA: Diagnosis present

## 2020-12-11 DIAGNOSIS — Z923 Personal history of irradiation: Secondary | ICD-10-CM

## 2020-12-11 DIAGNOSIS — E876 Hypokalemia: Secondary | ICD-10-CM | POA: Diagnosis present

## 2020-12-11 DIAGNOSIS — I700 Atherosclerosis of aorta: Secondary | ICD-10-CM | POA: Diagnosis not present

## 2020-12-11 DIAGNOSIS — F17290 Nicotine dependence, other tobacco product, uncomplicated: Secondary | ICD-10-CM

## 2020-12-11 DIAGNOSIS — E1159 Type 2 diabetes mellitus with other circulatory complications: Secondary | ICD-10-CM | POA: Diagnosis present

## 2020-12-11 DIAGNOSIS — E1165 Type 2 diabetes mellitus with hyperglycemia: Secondary | ICD-10-CM | POA: Diagnosis not present

## 2020-12-11 DIAGNOSIS — Z9012 Acquired absence of left breast and nipple: Secondary | ICD-10-CM

## 2020-12-11 DIAGNOSIS — R109 Unspecified abdominal pain: Secondary | ICD-10-CM | POA: Diagnosis not present

## 2020-12-11 DIAGNOSIS — Z7984 Long term (current) use of oral hypoglycemic drugs: Secondary | ICD-10-CM

## 2020-12-11 DIAGNOSIS — Z853 Personal history of malignant neoplasm of breast: Secondary | ICD-10-CM | POA: Diagnosis not present

## 2020-12-11 DIAGNOSIS — E785 Hyperlipidemia, unspecified: Secondary | ICD-10-CM | POA: Diagnosis present

## 2020-12-11 DIAGNOSIS — E119 Type 2 diabetes mellitus without complications: Secondary | ICD-10-CM

## 2020-12-11 DIAGNOSIS — J449 Chronic obstructive pulmonary disease, unspecified: Secondary | ICD-10-CM | POA: Diagnosis not present

## 2020-12-11 LAB — CBC
HCT: 37.2 % (ref 36.0–46.0)
Hemoglobin: 11.6 g/dL — ABNORMAL LOW (ref 12.0–15.0)
MCH: 25.7 pg — ABNORMAL LOW (ref 26.0–34.0)
MCHC: 31.2 g/dL (ref 30.0–36.0)
MCV: 82.5 fL (ref 80.0–100.0)
Platelets: 477 10*3/uL — ABNORMAL HIGH (ref 150–400)
RBC: 4.51 MIL/uL (ref 3.87–5.11)
RDW: 21.8 % — ABNORMAL HIGH (ref 11.5–15.5)
WBC: 8.7 10*3/uL (ref 4.0–10.5)
nRBC: 0 % (ref 0.0–0.2)

## 2020-12-11 LAB — BASIC METABOLIC PANEL
Anion gap: 10 (ref 5–15)
BUN: 13 mg/dL (ref 8–23)
CO2: 27 mmol/L (ref 22–32)
Calcium: 9.2 mg/dL (ref 8.9–10.3)
Chloride: 99 mmol/L (ref 98–111)
Creatinine, Ser: 0.91 mg/dL (ref 0.44–1.00)
GFR, Estimated: >60 mL/min (ref >60)
Glucose, Bld: 264 mg/dL — ABNORMAL HIGH (ref 70–99)
Potassium: 3.1 mmol/L — ABNORMAL LOW (ref 3.5–5.1)
Sodium: 136 mmol/L (ref 135–145)

## 2020-12-11 LAB — TROPONIN I (HIGH SENSITIVITY)
Troponin I (High Sensitivity): 13 ng/L (ref <18)
Troponin I (High Sensitivity): 16 ng/L (ref <18)

## 2020-12-11 LAB — BRAIN NATRIURETIC PEPTIDE: B Natriuretic Peptide: 103.7 pg/mL — ABNORMAL HIGH (ref 0.0–100.0)

## 2020-12-11 MED ORDER — PREDNISONE 50 MG PO TABS
50.0000 mg | ORAL_TABLET | Freq: Once | ORAL | Status: AC
  Administered 2020-12-11: 50 mg via ORAL
  Filled 2020-12-11: qty 1

## 2020-12-11 MED ORDER — IPRATROPIUM BROMIDE 0.02 % IN SOLN
0.5000 mg | Freq: Once | RESPIRATORY_TRACT | Status: AC
  Administered 2020-12-11: 0.5 mg via RESPIRATORY_TRACT
  Filled 2020-12-11: qty 2.5

## 2020-12-11 MED ORDER — AEROCHAMBER Z-STAT PLUS/MEDIUM MISC
1.0000 | Freq: Once | Status: AC
  Administered 2020-12-12: 1
  Filled 2020-12-11: qty 1

## 2020-12-11 MED ORDER — ALBUTEROL SULFATE (2.5 MG/3ML) 0.083% IN NEBU
5.0000 mg | INHALATION_SOLUTION | Freq: Once | RESPIRATORY_TRACT | Status: AC
  Administered 2020-12-11: 5 mg via RESPIRATORY_TRACT
  Filled 2020-12-11: qty 6

## 2020-12-11 MED ORDER — ALBUTEROL SULFATE HFA 108 (90 BASE) MCG/ACT IN AERS: 1.0000 | INHALATION_SPRAY | Freq: Four times a day (QID) | RESPIRATORY_TRACT | Status: DC | PRN

## 2020-12-11 MED ORDER — ALBUTEROL SULFATE (2.5 MG/3ML) 0.083% IN NEBU: 2.5000 mg | INHALATION_SOLUTION | Freq: Once | RESPIRATORY_TRACT | Status: DC

## 2020-12-11 NOTE — H&P (Signed)
History and Physical    Dawn [name] IZT:[id number] DOB: 1940-09-24 DOA: 12/11/2020  PCP: Lawerance Cruel, MD  Patient coming from: Home  I have personally briefly reviewed patient's old medical records in [place]  Chief Complaint: Shortness of breath, weakness  HPI: Dawn [name] is a 80 y.o. female with medical history significant for COPD, T2DM, HTN, HLD, breast cancer, and tobacco use who presents to the ED for evaluation of shortness of breath.  Patient was recently admitted 12/04/2020-12/06/2020 for symptomatic anemia.  She was transfused 2 unit PRBC.  Could not receive IV iron infusion due to adverse reaction.  She was also found to have a right midlung mass on CT chest.  Bronchoscopy was planned while in hospital however patient deferred and wanted to have this performed as an outpatient.  Patient states that she does not have any inhalers at home as she did not pick up her albuterol prescription.  She is having continued shortness of breath.  She has chronic nonproductive cough which she says is unchanged from her baseline.  She has not been eating well over the last 2 days and has become generally weak.  Son states that when she left the hospital she was strong and able to walk on her own however today was having significant difficulty walking just short distances to the car.  Patient states she is currently smoking between 1-2 packs/day.  ED Course:  Initial vitals show BP 173/78, pulse 106, RR 22, temp 97.9 F, SPO2 93% on room air.  Labs show WBC 8.7, hemoglobin 11.6, platelets 477,000, sodium 136, potassium 3.1, bicarb 27, BUN 13, creatinine 0.91, serum glucose 264, BNP 103.7, high-sensitivity troponin I 13 > 16.  SARS-CoV-2 PCR panel is ordered and pending.  Portable chest x-ray again shows stable right midlung mass.  Patient was given albuterol and Atrovent nebulizers, oral prednisone, and the hospitalist service was consulted to admit for further evaluation and  management.  Review of Systems: All systems reviewed and are negative except as documented in history of present illness above.   Past Medical History:  Diagnosis Date  . Anxiety   . Breast cancer of upper-outer quadrant of left female breast ([place]) 08/18/2015  . Broken wrist    when younger x2  . COPD (chronic obstructive pulmonary disease) [facility])    Hospitalization March 2011  . Depression   . Diabetes mellitus without complication ([place])   . Full dentures   . H/O vaginal delivery 1962   x1   . High cholesterol   . History of home oxygen therapy   . Hypertension   . Pneumonia   . Radiation    11/18/15- 12/16/15 to her Left Breast    Past Surgical History:  Procedure Laterality Date  . ABDOMINAL HYSTERECTOMY    . ABDOMINAL HYSTERECTOMY    . APPENDECTOMY    . CHOLECYSTECTOMY    . EYE SURGERY     cataracts  . RADIOACTIVE SEED GUIDED PARTIAL MASTECTOMY WITH AXILLARY SENTINEL LYMPH NODE BIOPSY Left 09/17/2015   Procedure: RADIOACTIVE SEED LOCALIZATION LEFT BREAST LUMPECTOMY AND LEFT AXILLARY SENTINEL LYMPH NODE BIOPSY;  Surgeon: Excell Seltzer, MD;  Location: [facility];  Service: General;  Laterality: Left;  . TONSILLECTOMY      Social History:  reports that she has been smoking cigarettes. She has been smoking about 1.00 pack per day. She has never used smokeless tobacco. She reports current alcohol use. She reports that she does not use drugs.  Allergies  Allergen Reactions  . Ferumoxytol Shortness Of Breath, Itching and Other (See Comments)    Pt reported lightheadedness, a flushed feeling, SOB, and itching.    . Pneumococcal Vaccines Hives and Swelling    Arm swelled up  . Repaglinide Other (See Comments)    Drops BS too low. Other reaction(s): blood sugar drop    Family History  Problem Relation Age of Onset  . Colon cancer Sister   . Lung cancer Sister      Prior to Admission medications   Medication Sig Start Date End Date Taking? Authorizing Provider  albuterol  (VENTOLIN HFA) 108 (90 Base) MCG/ACT inhaler Inhale 2 puffs into the lungs every 2 (two) hours as needed for wheezing or shortness of breath. 12/06/20  Yes Dahal, Marlowe Aschoff, MD  aspirin EC 81 MG tablet Take 81 mg by mouth daily.   Yes [provider]  Cholecalciferol (VITAMIN D3) 5000 UNITS TABS Take 5,000 Units by mouth daily.   Yes [provider]  metFORMIN (GLUCOPHAGE-XR) 500 MG 24 hr tablet Take 1 tablet (500 mg total) by mouth 2 (two) times daily. 12/06/20 03/06/21 Yes Dahal, Marlowe Aschoff, MD  pantoprazole (PROTONIX) 40 MG tablet Take 40 mg by mouth daily. 09/24/20  Yes [provider]  pentoxifylline (TRENTAL) 400 MG CR tablet Take 1 tablet (400 mg total) by mouth 3 (three) times daily with meals. 06/03/20  Yes Nicholas Lose, MD  quinapril (ACCUPRIL) 40 MG tablet Take 40 mg by mouth daily.    Yes [provider]  rosuvastatin (CRESTOR) 40 MG tablet Take 40 mg by mouth daily.   Yes [provider]  verapamil (CALAN-SR) 240 MG CR tablet Take 240 mg by mouth daily. 06/10/15  Yes [provider]  dextromethorphan-guaiFENesin (MUCINEX DM) 30-600 MG 12hr tablet Take 1 tablet by mouth 2 (two) times daily. 12/06/20 01/05/21  Terrilee Croak, MD    Physical Exam: Vitals:   12/11/20 2100 12/11/20 2130 12/11/20 2200 12/11/20 2321  BP: (!) 164/74 (!) 156/74 (!) 174/74 (!) 172/79  Pulse: (!) 103 (!) 105 (!) 102 (!) 105  Resp:  20 20 18   Temp:      TempSrc:      SpO2: 91% 92% 94% 92%  Weight:      Height:       Constitutional: Resting in bed with head elevated, NAD, calm, comfortable Eyes: PERRL, lids and conjunctivae normal ENMT: Mucous membranes are moist. Posterior pharynx clear of any exudate or lesions.Normal dentition.  Neck: normal, supple, no masses. Respiratory: Coarse expiratory wheezing throughout the lung fields.  Normal respiratory effort. No accessory muscle use.  Cardiovascular: Regular rate and rhythm, no murmurs / rubs / gallops. No extremity  edema. 2+ pedal pulses. Abdomen: no tenderness, no masses palpated. No hepatosplenomegaly. Bowel sounds positive.  Musculoskeletal: no clubbing / cyanosis. No joint deformity upper and lower extremities. Good ROM, no contractures. Normal muscle tone.  Skin: no rashes, lesions, ulcers. No induration Neurologic: CN 2-12 grossly intact. Sensation intact. Strength 5/5 in all 4.  Psychiatric: Normal judgment and insight. Alert and oriented x 3. Normal mood.   Labs on Admission: I have personally reviewed following labs and imaging studies  CBC: Recent Labs  Lab 12/05/20 0956 12/11/20 1919  WBC 5.6 8.7  HGB 10.7* 11.6*  HCT 34.8* 37.2  MCV 80.6 82.5  PLT 446* 448*   Basic Metabolic Panel: Recent Labs  Lab 12/05/20 0956 12/11/20 1919  NA 131* 136  K 4.1 3.1*  CL 94* 99  CO2 24 27  GLUCOSE 276* 264*  BUN 15 13  CREATININE 0.97 0.91  CALCIUM 9.6 9.2   GFR: Estimated Creatinine Clearance: 44 mL/min (by C-G formula based on SCr of 0.91 mg/dL). Liver Function Tests: Recent Labs  Lab 12/05/20 0956  AST 20  ALT 9  ALKPHOS 85  BILITOT 1.0  PROT 7.2  ALBUMIN 3.3*   No results for input(s): LIPASE, AMYLASE in the last 168 hours. No results for input(s): AMMONIA in the last 168 hours. Coagulation Profile: No results for input(s): INR, PROTIME in the last 168 hours. Cardiac Enzymes: No results for input(s): CKTOTAL, CKMB, CKMBINDEX, TROPONINI in the last 168 hours. BNP (last 3 results) No results for input(s): PROBNP in the last 8760 hours. HbA1C: No results for input(s): HGBA1C in the last 72 hours. CBG: Recent Labs  Lab 12/05/20 1140 12/05/20 1626 12/05/20 2116 12/06/20 0742 12/06/20 1134  GLUCAP 326* 183* 205* 258* 139*   Lipid Profile: No results for input(s): CHOL, HDL, LDLCALC, TRIG, CHOLHDL, LDLDIRECT in the last 72 hours. Thyroid Function Tests: No results for input(s): TSH, T4TOTAL, FREET4, T3FREE, THYROIDAB in the last 72 hours. Anemia Panel: No results  for input(s): VITAMINB12, FOLATE, FERRITIN, TIBC, IRON, RETICCTPCT in the last 72 hours. Urine analysis:    Component Value Date/Time   COLORURINE YELLOW 11/25/2009 1109   APPEARANCEUR CLEAR 11/25/2009 1109   LABSPEC 1.015 11/25/2009 1109   PHURINE 6.0 11/25/2009 1109   GLUCOSEU NEGATIVE 11/25/2009 1109   HGBUR SMALL (A) 11/25/2009 1109   BILIRUBINUR NEGATIVE 11/25/2009 1109   [name] 11/25/2009 1109   PROTEINUR 100 (A) 11/25/2009 1109   UROBILINOGEN 0.2 11/25/2009 1109   NITRITE NEGATIVE 11/25/2009 1109   LEUKOCYTESUR NEGATIVE 11/25/2009 1109    Radiological Exams on Admission: DG Chest Portable 1 View  Result Date: 12/11/2020 CLINICAL DATA:  Shortness of breath EXAM: PORTABLE CHEST 1 VIEW COMPARISON:  12/04/2020 FINDINGS: Cardiac shadow is stable. The lungs are well aerated bilaterally. Persistent masslike density in the right mid lung is noted similar to that seen on prior plain film and recent CT from 12/04/2020. No other focal lung abnormality is noted. Aortic calcifications are seen. IMPRESSION: Stable right mid lung mass unchanged from prior exams 7 days previous. Electronically Signed   By: Inez Catalina M.D.   On: 12/11/2020 19:49    EKG: Not performed.  Assessment/Plan Principal Problem:   COPD with acute exacerbation ([place]) Active Problems:   Tobacco abuse   Type 2 diabetes mellitus ([place])   Hypertension associated with diabetes ([place])   Hyperlipidemia associated with type 2 diabetes mellitus ([place])   Hypokalemia   Dawn [name] is a 80 y.o. female with medical history significant for COPD, T2DM, HTN, HLD, breast cancer, and tobacco use is admitted with COPD exacerbation.  COPD with acute exacerbation: -Start IV Solu-Medrol 40 mg twice daily -Start scheduled duo nebs with as needed albuterol nebulizers -Supplemental O2 if needed  Right middle lobe lung mass: Seen on CT imaging 12/04/2020.  Patient declined bronchoscopy during last admission.  She has  follow-up scheduled with pulmonology, Dr. Lamonte Sakai on 4/6.  Deconditioning/generalized weakness: Likely worsening in the setting of COPD exacerbation and possible lung cancer.  Continue management of COPD as above.  PT/OT eval.  Hypokalemia: Supplement, check magnesium.  Type 2 diabetes: Holding Metformin, start sliding scale insulin and adjust as needed.  Hypertension: Continue quinapril and verapamil.  Hyperlipidemia: Continue rosuvastatin.  History of left breast cancer s/p lumpectomy and adjuvant radiation therapy 2017:  Follows with oncology, Dr. Lindi Adie.  Continue pentoxifylline.  Has been on anastrozole, not currently on home medication list.  Tobacco use: Smoking 1-2 packs/day.  Patient strongly advised to start cutting back eventually completely cessation from smoking.  Nicotine patch ordered.  DVT prophylaxis: Lovenox Code Status: Full code, confirmed with patient Family Communication: Discussed with patient's son at bedside Disposition Plan: From home, dispo pending improvement in respiratory status and PT/OT eval Consults called: None Level of care: Med-Surg Admission status:  Status is: Observation  The patient remains OBS appropriate and will d/c before 2 midnights.  Dispo: The patient is from: Home              Anticipated d/c is to: Home              Patient currently is not medically stable to d/c.   Difficult to place patient No  Zada Finders MD [facility]  If 7PM-7AM, please contact night-coverage [email or web address]  12/11/2020, 11:52 PM

## 2020-12-11 NOTE — ED Provider Notes (Signed)
[facility] DEPT Provider Note   CSN: [id number] Arrival date & time: 12/11/20  1811     History Chief Complaint  Patient presents with  . Shortness of Breath    Dawn [name] is a 80 y.o. female.  HPI   Patient states her neighbors called EMS because they felt she was having trouble with shortness of breath.  Patient states she does have some intermittent issues with shortness of breath and weakness.  Patient was recently in the hospital treated for COPD exacerbation.  Patient was also noted to have a lung mass.  Further evaluation was recommended but the patient wanted to leave and follow-up as an outpatient.  Patient was given prescription for albuterol but she never picked up that prescription.  She denies any fevers or chills.  She is not having any chest pain.  Past Medical History:  Diagnosis Date  . Anxiety   . Breast cancer of upper-outer quadrant of left female breast ([place]) 08/18/2015  . Broken wrist    when younger x2  . COPD (chronic obstructive pulmonary disease) [facility])    Hospitalization March 2011  . Depression   . Diabetes mellitus without complication ([place])   . Full dentures   . H/O vaginal delivery 1962   x1   . High cholesterol   . History of home oxygen therapy   . Hypertension   . Pneumonia   . Radiation    11/18/15- 12/16/15 to her Left Breast    Patient Active Problem List   Diagnosis Date Noted  . Symptomatic anemia 12/04/2020  . Diabetes ([place]) 12/04/2020  . COPD (chronic obstructive pulmonary disease) ([place]) 12/04/2020  . Mass of right lung 12/04/2020  . Tobacco abuse 09/30/2015  . Breast cancer of upper-outer quadrant of left female breast ([place]) 08/18/2015  . Essential hypertension, benign 05/22/2014  . Mixed hyperlipidemia 05/22/2014    Past Surgical History:  Procedure Laterality Date  . ABDOMINAL HYSTERECTOMY    . ABDOMINAL HYSTERECTOMY    . APPENDECTOMY    . CHOLECYSTECTOMY    . EYE SURGERY     cataracts   . RADIOACTIVE SEED GUIDED PARTIAL MASTECTOMY WITH AXILLARY SENTINEL LYMPH NODE BIOPSY Left 09/17/2015   Procedure: RADIOACTIVE SEED LOCALIZATION LEFT BREAST LUMPECTOMY AND LEFT AXILLARY SENTINEL LYMPH NODE BIOPSY;  Surgeon: Excell Seltzer, MD;  Location: [facility];  Service: General;  Laterality: Left;  . TONSILLECTOMY       OB History   No obstetric history on file.     Family History  Problem Relation Age of Onset  . Colon cancer Sister   . Lung cancer Sister     Social History   Tobacco Use  . Smoking status: Current Every Day Smoker    Packs/day: 1.00    Types: Cigarettes  . Smokeless tobacco: Never Used  Vaping Use  . Vaping Use: Never used  Substance Use Topics  . Alcohol use: Yes    Comment: rare  . Drug use: No    Home Medications Prior to Admission medications   Medication Sig Start Date End Date Taking? Authorizing Provider  albuterol (VENTOLIN HFA) 108 (90 Base) MCG/ACT inhaler Inhale 2 puffs into the lungs every 2 (two) hours as needed for wheezing or shortness of breath. 12/06/20   Dahal, Marlowe Aschoff, MD  anastrozole (ARIMIDEX) 1 MG tablet Take 1 tablet (1 mg total) by mouth daily. 06/03/20   Nicholas Lose, MD  aspirin EC 81 MG tablet Take 81 mg by mouth daily.  [provider]  Cholecalciferol (VITAMIN D3) 5000 UNITS TABS Take 5,000 Units by mouth daily.    [provider]  dextromethorphan-guaiFENesin (MUCINEX DM) 30-600 MG 12hr tablet Take 1 tablet by mouth 2 (two) times daily. 12/06/20 01/05/21  Dahal, Marlowe Aschoff, MD  glipiZIDE (GLUCOTROL) 5 MG tablet Take 5 mg by mouth daily before breakfast.    [provider]  metFORMIN (GLUCOPHAGE-XR) 500 MG 24 hr tablet Take 1 tablet (500 mg total) by mouth 2 (two) times daily. 12/06/20 03/06/21  Terrilee Croak, MD  pantoprazole (PROTONIX) 40 MG tablet Take 40 mg by mouth daily. 09/24/20   [provider]  pentoxifylline (TRENTAL) 400 MG CR tablet Take 1 tablet (400 mg total) by mouth 3 (three) times  daily with meals. 06/03/20   Nicholas Lose, MD  quinapril (ACCUPRIL) 40 MG tablet Take 40 mg by mouth daily.     [provider]  rosuvastatin (CRESTOR) 40 MG tablet Take 40 mg by mouth daily.    [provider]  verapamil (CALAN-SR) 240 MG CR tablet Take 240 mg by mouth daily. 06/10/15   [provider]    Allergies    Ferumoxytol, Pneumococcal vaccines, and Repaglinide  Review of Systems   Review of Systems  All other systems reviewed and are negative.   Physical Exam Updated Vital Signs BP (!) 174/74 (BP Location: Right Arm)   Pulse (!) 102   Temp 97.9 F (36.6 C) (Oral)   Resp 20   Ht 1.626 m (5\' 4" )   Wt 61.2 kg   SpO2 94%   BMI 23.17 kg/m   Physical Exam Vitals and nursing note reviewed.  Constitutional:      Appearance: She is not ill-appearing or diaphoretic.     Comments: Smells of cigarette smoke  HENT:     Head: Normocephalic and atraumatic.     Right Ear: External ear normal.     Left Ear: External ear normal.  Eyes:     General: No scleral icterus.       Right eye: No discharge.        Left eye: No discharge.     Conjunctiva/sclera: Conjunctivae normal.  Neck:     Trachea: No tracheal deviation.  Cardiovascular:     Rate and Rhythm: Normal rate and regular rhythm.  Pulmonary:     Effort: Pulmonary effort is normal. No respiratory distress.     Breath sounds: No stridor. Wheezing present. No rales.     Comments: Speaking in full sentences, no distress, wheezes noted on auscultation Abdominal:     General: Bowel sounds are normal. There is no distension.     Palpations: Abdomen is soft.     Tenderness: There is no abdominal tenderness. There is no guarding or rebound.  Musculoskeletal:        General: No tenderness.     Cervical back: Neck supple.  Skin:    General: Skin is warm and dry.     Findings: No rash.  Neurological:     Mental Status: She is alert.     Cranial Nerves: No cranial nerve deficit (no facial droop,  extraocular movements intact, no slurred speech).     Sensory: No sensory deficit.     Motor: No abnormal muscle tone or seizure activity.     Coordination: Coordination normal.     ED Results / Procedures / Treatments   Labs (all labs ordered are listed, but only abnormal results are displayed) Labs Reviewed  CBC -  Abnormal; Notable for the following components:      Result Value   Hemoglobin 11.6 (*)    MCH 25.7 (*)    RDW 21.8 (*)    Platelets 477 (*)    All other components within normal limits  BASIC METABOLIC PANEL - Abnormal; Notable for the following components:   Potassium 3.1 (*)    Glucose, Bld 264 (*)    All other components within normal limits  BRAIN NATRIURETIC PEPTIDE - Abnormal; Notable for the following components:   B Natriuretic Peptide 103.7 (*)    All other components within normal limits  RESP PANEL BY RT-PCR (FLU A&B, COVID) ARPGX2  TROPONIN I (HIGH SENSITIVITY)  TROPONIN I (HIGH SENSITIVITY)     Radiology DG Chest Portable 1 View  Result Date: 12/11/2020 CLINICAL DATA:  Shortness of breath EXAM: PORTABLE CHEST 1 VIEW COMPARISON:  12/04/2020 FINDINGS: Cardiac shadow is stable. The lungs are well aerated bilaterally. Persistent masslike density in the right mid lung is noted similar to that seen on prior plain film and recent CT from 12/04/2020. No other focal lung abnormality is noted. Aortic calcifications are seen. IMPRESSION: Stable right mid lung mass unchanged from prior exams 7 days previous. Electronically Signed   By: Inez Catalina M.D.   On: 12/11/2020 19:49    Procedures Procedures   Medications Ordered in ED Medications  albuterol (VENTOLIN HFA) 108 (90 Base) MCG/ACT inhaler 1-2 puff (has no administration in time range)  aerochamber Z-Stat Plus/medium 1 each (has no administration in time range)  albuterol (PROVENTIL) (2.5 MG/3ML) 0.083% nebulizer solution 2.5 mg (has no administration in time range)  albuterol (PROVENTIL) (2.5 MG/3ML)  0.083% nebulizer solution 5 mg (5 mg Nebulization Given 12/11/20 1920)  ipratropium (ATROVENT) nebulizer solution 0.5 mg (0.5 mg Nebulization Given 12/11/20 1944)  predniSONE (DELTASONE) tablet 50 mg (50 mg Oral Given 12/11/20 1920)    ED Course  I have reviewed the triage vital signs and the nursing notes.  Pertinent labs & imaging results that were available during my care of the patient were reviewed by me and considered in my medical decision making (see chart for details).  Clinical Course as of 12/11/20 2252  Thu Dec 11, 2020  2143 BNP unremarkable. [JK]  2143 CBC unremarkable.  First troponin normal. [JK]  2595 Metabolic panel notable for hyperglycemia [JK]  2143 Chest x-ray shows stable lung mass [JK]  2233 Patient attempted to ambulate but was only really able to stand.  She still feels too weak to walk around [JK]  2251 Son was willing to take patient home but she still is feeling weak breath.  Patient feels like she needs to be admitted to the hospital. [JK]    Clinical Course User Index [JK] Dorie Rank, MD   MDM Rules/Calculators/A&P                          Pt presents with recurrent shortness of breath.  Known copd. Continues to smoke.  Pt also with recent dx lung mass.  Here in the ED patient does appear to have COPD exacerbation.  She was treated with albuterol treatments and steroids.  Known mass is still present on chest x-ray.  Patient had some improvement with her breathing treatments and steroids but she continues to feel very weak.  I suspect this is somewhat multifactorial but it is likely related to her COPD and this lung mass.  Patient was post get a bronchoscopy but  she left the hospital last time for that could be done.  Think she may benefit from admission for COPD exacerbation and pulmonary consultation.  Final Clinical Impression(s) / ED Diagnoses Final diagnoses:  COPD exacerbation ([place])  Lung mass      Dorie Rank, MD 12/11/20 2312

## 2020-12-11 NOTE — ED Triage Notes (Signed)
Patient reports neighbor called rescue due to pt having SOB. Pt reports intermittent SOB with generalized weakness. Pt was recently here and albuterol was prescribed but patient never got inhaler, so has been unable to take medication. Pt has audible wheezing.

## 2020-12-12 DIAGNOSIS — E119 Type 2 diabetes mellitus without complications: Secondary | ICD-10-CM

## 2020-12-12 DIAGNOSIS — Z888 Allergy status to other drugs, medicaments and biological substances status: Secondary | ICD-10-CM | POA: Diagnosis not present

## 2020-12-12 DIAGNOSIS — F17210 Nicotine dependence, cigarettes, uncomplicated: Secondary | ICD-10-CM | POA: Diagnosis present

## 2020-12-12 DIAGNOSIS — Z7984 Long term (current) use of oral hypoglycemic drugs: Secondary | ICD-10-CM | POA: Diagnosis not present

## 2020-12-12 DIAGNOSIS — Z79811 Long term (current) use of aromatase inhibitors: Secondary | ICD-10-CM | POA: Diagnosis not present

## 2020-12-12 DIAGNOSIS — R531 Weakness: Secondary | ICD-10-CM

## 2020-12-12 DIAGNOSIS — Z20822 Contact with and (suspected) exposure to covid-19: Secondary | ICD-10-CM | POA: Diagnosis present

## 2020-12-12 DIAGNOSIS — J439 Emphysema, unspecified: Secondary | ICD-10-CM | POA: Diagnosis present

## 2020-12-12 DIAGNOSIS — E782 Mixed hyperlipidemia: Secondary | ICD-10-CM | POA: Diagnosis present

## 2020-12-12 DIAGNOSIS — I152 Hypertension secondary to endocrine disorders: Secondary | ICD-10-CM | POA: Diagnosis present

## 2020-12-12 DIAGNOSIS — Z8701 Personal history of pneumonia (recurrent): Secondary | ICD-10-CM | POA: Diagnosis not present

## 2020-12-12 DIAGNOSIS — E1165 Type 2 diabetes mellitus with hyperglycemia: Secondary | ICD-10-CM | POA: Diagnosis present

## 2020-12-12 DIAGNOSIS — Z923 Personal history of irradiation: Secondary | ICD-10-CM | POA: Diagnosis not present

## 2020-12-12 DIAGNOSIS — Z79899 Other long term (current) drug therapy: Secondary | ICD-10-CM | POA: Diagnosis not present

## 2020-12-12 DIAGNOSIS — Z7982 Long term (current) use of aspirin: Secondary | ICD-10-CM | POA: Diagnosis not present

## 2020-12-12 DIAGNOSIS — I700 Atherosclerosis of aorta: Secondary | ICD-10-CM

## 2020-12-12 DIAGNOSIS — Z853 Personal history of malignant neoplasm of breast: Secondary | ICD-10-CM | POA: Diagnosis not present

## 2020-12-12 DIAGNOSIS — F17290 Nicotine dependence, other tobacco product, uncomplicated: Secondary | ICD-10-CM

## 2020-12-12 DIAGNOSIS — E1169 Type 2 diabetes mellitus with other specified complication: Secondary | ICD-10-CM | POA: Diagnosis present

## 2020-12-12 DIAGNOSIS — J441 Chronic obstructive pulmonary disease with (acute) exacerbation: Secondary | ICD-10-CM

## 2020-12-12 DIAGNOSIS — R918 Other nonspecific abnormal finding of lung field: Secondary | ICD-10-CM

## 2020-12-12 DIAGNOSIS — J9601 Acute respiratory failure with hypoxia: Secondary | ICD-10-CM | POA: Diagnosis present

## 2020-12-12 DIAGNOSIS — T380X5A Adverse effect of glucocorticoids and synthetic analogues, initial encounter: Secondary | ICD-10-CM | POA: Diagnosis present

## 2020-12-12 DIAGNOSIS — Z972 Presence of dental prosthetic device (complete) (partial): Secondary | ICD-10-CM | POA: Diagnosis not present

## 2020-12-12 DIAGNOSIS — E876 Hypokalemia: Secondary | ICD-10-CM | POA: Diagnosis present

## 2020-12-12 DIAGNOSIS — Z90710 Acquired absence of both cervix and uterus: Secondary | ICD-10-CM | POA: Diagnosis not present

## 2020-12-12 DIAGNOSIS — Z887 Allergy status to serum and vaccine status: Secondary | ICD-10-CM | POA: Diagnosis not present

## 2020-12-12 LAB — GLUCOSE, CAPILLARY
Glucose-Capillary: 299 mg/dL — ABNORMAL HIGH (ref 70–99)
Glucose-Capillary: 331 mg/dL — ABNORMAL HIGH (ref 70–99)
Glucose-Capillary: 262 mg/dL — ABNORMAL HIGH (ref 70–99)
Glucose-Capillary: 271 mg/dL — ABNORMAL HIGH (ref 70–99)
Glucose-Capillary: 293 mg/dL — ABNORMAL HIGH (ref 70–99)

## 2020-12-12 LAB — CBG MONITORING, ED: Glucose-Capillary: 309 mg/dL — ABNORMAL HIGH (ref 70–99)

## 2020-12-12 LAB — RESP PANEL BY RT-PCR (FLU A&B, COVID) ARPGX2
Influenza A by PCR: NEGATIVE
Influenza B by PCR: NEGATIVE
SARS Coronavirus 2 by RT PCR: NEGATIVE

## 2020-12-12 LAB — CBC
HCT: 36.7 % (ref 36.0–46.0)
Hemoglobin: 11.2 g/dL — ABNORMAL LOW (ref 12.0–15.0)
MCH: 25.5 pg — ABNORMAL LOW (ref 26.0–34.0)
MCHC: 30.5 g/dL (ref 30.0–36.0)
MCV: 83.6 fL (ref 80.0–100.0)
Platelets: 426 10*3/uL — ABNORMAL HIGH (ref 150–400)
RBC: 4.39 MIL/uL (ref 3.87–5.11)
RDW: 22.2 % — ABNORMAL HIGH (ref 11.5–15.5)
WBC: 7.2 10*3/uL (ref 4.0–10.5)
nRBC: 0 % (ref 0.0–0.2)

## 2020-12-12 LAB — BASIC METABOLIC PANEL
Anion gap: 13 (ref 5–15)
BUN: 13 mg/dL (ref 8–23)
CO2: 22 mmol/L (ref 22–32)
Calcium: 9.1 mg/dL (ref 8.9–10.3)
Chloride: 98 mmol/L (ref 98–111)
Creatinine, Ser: 1.01 mg/dL — ABNORMAL HIGH (ref 0.44–1.00)
GFR, Estimated: 57 mL/min — ABNORMAL LOW (ref >60)
Glucose, Bld: 282 mg/dL — ABNORMAL HIGH (ref 70–99)
Potassium: 3.8 mmol/L (ref 3.5–5.1)
Sodium: 133 mmol/L — ABNORMAL LOW (ref 135–145)

## 2020-12-12 LAB — MAGNESIUM: Magnesium: 1.5 mg/dL — ABNORMAL LOW (ref 1.7–2.4)

## 2020-12-12 MED ORDER — QUINAPRIL HCL 10 MG PO TABS: 40.0000 mg | ORAL_TABLET | Freq: Every day | ORAL | Status: DC

## 2020-12-12 MED ORDER — POTASSIUM CHLORIDE 20 MEQ PO PACK
40.0000 meq | PACK | Freq: Once | ORAL | Status: AC
  Administered 2020-12-12: 40 meq via ORAL
  Filled 2020-12-12: qty 2

## 2020-12-12 MED ORDER — INSULIN ASPART 100 UNIT/ML ~~LOC~~ SOLN
0.0000 [IU] | Freq: Every day | SUBCUTANEOUS | Status: DC
  Administered 2020-12-12: 3 [IU] via SUBCUTANEOUS
  Administered 2020-12-12 – 2020-12-13 (×2): 4 [IU] via SUBCUTANEOUS
  Filled 2020-12-12: qty 0.05

## 2020-12-12 MED ORDER — NICOTINE 21 MG/24HR TD PT24
21.0000 mg | MEDICATED_PATCH | Freq: Every day | TRANSDERMAL | Status: DC
  Administered 2020-12-12 – 2020-12-14 (×4): 21 mg via TRANSDERMAL
  Filled 2020-12-12 (×4): qty 1

## 2020-12-12 MED ORDER — ROSUVASTATIN CALCIUM 20 MG PO TABS
40.0000 mg | ORAL_TABLET | Freq: Every day | ORAL | Status: DC
  Administered 2020-12-12 – 2020-12-14 (×3): 40 mg via ORAL
  Filled 2020-12-12 (×3): qty 2

## 2020-12-12 MED ORDER — PANTOPRAZOLE SODIUM 40 MG PO TBEC
40.0000 mg | DELAYED_RELEASE_TABLET | Freq: Every day | ORAL | Status: DC
Start: 2020-12-12 — End: 2020-12-14
  Administered 2020-12-12 – 2020-12-14 (×3): 40 mg via ORAL
  Filled 2020-12-12 (×3): qty 1

## 2020-12-12 MED ORDER — ACETAMINOPHEN 650 MG RE SUPP: 650.0000 mg | Freq: Four times a day (QID) | RECTAL | Status: DC | PRN

## 2020-12-12 MED ORDER — VERAPAMIL HCL ER 240 MG PO TBCR
240.0000 mg | EXTENDED_RELEASE_TABLET | Freq: Every day | ORAL | Status: DC
  Administered 2020-12-12 – 2020-12-14 (×3): 240 mg via ORAL
  Filled 2020-12-12 (×3): qty 1

## 2020-12-12 MED ORDER — ALBUTEROL SULFATE (2.5 MG/3ML) 0.083% IN NEBU: 2.5000 mg | INHALATION_SOLUTION | RESPIRATORY_TRACT | Status: DC | PRN

## 2020-12-12 MED ORDER — INSULIN ASPART 100 UNIT/ML ~~LOC~~ SOLN
3.0000 [IU] | Freq: Three times a day (TID) | SUBCUTANEOUS | Status: DC
  Administered 2020-12-13 – 2020-12-14 (×4): 3 [IU] via SUBCUTANEOUS

## 2020-12-12 MED ORDER — ENOXAPARIN SODIUM 40 MG/0.4ML ~~LOC~~ SOLN
40.0000 mg | SUBCUTANEOUS | Status: DC
  Administered 2020-12-12 – 2020-12-14 (×3): 40 mg via SUBCUTANEOUS
  Filled 2020-12-12 (×3): qty 0.4

## 2020-12-12 MED ORDER — METHYLPREDNISOLONE SODIUM SUCC 40 MG IJ SOLR
40.0000 mg | Freq: Two times a day (BID) | INTRAMUSCULAR | Status: DC
  Administered 2020-12-12 – 2020-12-13 (×5): 40 mg via INTRAVENOUS
  Filled 2020-12-12 (×5): qty 1

## 2020-12-12 MED ORDER — LISINOPRIL 20 MG PO TABS
40.0000 mg | ORAL_TABLET | Freq: Every day | ORAL | Status: DC
  Administered 2020-12-12 – 2020-12-14 (×3): 40 mg via ORAL
  Filled 2020-12-12 (×3): qty 2

## 2020-12-12 MED ORDER — ASPIRIN EC 81 MG PO TBEC
81.0000 mg | DELAYED_RELEASE_TABLET | Freq: Every day | ORAL | Status: DC
  Administered 2020-12-12 – 2020-12-14 (×3): 81 mg via ORAL
  Filled 2020-12-12 (×3): qty 1

## 2020-12-12 MED ORDER — PENTOXIFYLLINE ER 400 MG PO TBCR
400.0000 mg | EXTENDED_RELEASE_TABLET | Freq: Three times a day (TID) | ORAL | Status: DC
  Administered 2020-12-12 – 2020-12-14 (×8): 400 mg via ORAL
  Filled 2020-12-12 (×10): qty 1

## 2020-12-12 MED ORDER — ONDANSETRON HCL 4 MG/2ML IJ SOLN: 4.0000 mg | Freq: Four times a day (QID) | INTRAMUSCULAR | Status: DC | PRN

## 2020-12-12 MED ORDER — ONDANSETRON HCL 4 MG PO TABS: 4.0000 mg | ORAL_TABLET | Freq: Four times a day (QID) | ORAL | Status: DC | PRN

## 2020-12-12 MED ORDER — ACETAMINOPHEN 325 MG PO TABS: 650.0000 mg | ORAL_TABLET | Freq: Four times a day (QID) | ORAL | Status: DC | PRN

## 2020-12-12 MED ORDER — INSULIN DETEMIR 100 UNIT/ML ~~LOC~~ SOLN
5.0000 [IU] | Freq: Every day | SUBCUTANEOUS | Status: DC
  Administered 2020-12-12 – 2020-12-13 (×2): 5 [IU] via SUBCUTANEOUS
  Filled 2020-12-12 (×3): qty 0.05

## 2020-12-12 MED ORDER — INSULIN ASPART 100 UNIT/ML ~~LOC~~ SOLN
0.0000 [IU] | Freq: Three times a day (TID) | SUBCUTANEOUS | Status: DC
  Administered 2020-12-12 (×2): 5 [IU] via SUBCUTANEOUS
  Administered 2020-12-12: 7 [IU] via SUBCUTANEOUS
  Administered 2020-12-13: 5 [IU] via SUBCUTANEOUS
  Administered 2020-12-13: 2 [IU] via SUBCUTANEOUS
  Administered 2020-12-13: 7 [IU] via SUBCUTANEOUS
  Administered 2020-12-14: 9 [IU] via SUBCUTANEOUS
  Filled 2020-12-12: qty 0.09

## 2020-12-12 MED ORDER — IPRATROPIUM-ALBUTEROL 0.5-2.5 (3) MG/3ML IN SOLN
3.0000 mL | Freq: Three times a day (TID) | RESPIRATORY_TRACT | Status: DC
  Administered 2020-12-12 – 2020-12-14 (×8): 3 mL via RESPIRATORY_TRACT
  Filled 2020-12-12 (×8): qty 3

## 2020-12-12 NOTE — Evaluation (Signed)
Physical Therapy Evaluation Patient Details Name: Dawn [name] MRN: [id number] DOB: 1941/07/01 Today's Date: 12/12/2020   History of Present Illness  80 y.o. female who presents to the ED for evaluation of shortness of breath with medical history significant for COPD, T2DM, HTN, HLD, breast cancer, and tobacco use. Dx of COPD exacerbation. Pt had recent admission 3/24-3/26/22 for anemia, lung mass found on imaging, pt has outpt f/u on 12/17/20 for this.  Clinical Impression  Pt admitted with above diagnosis. Pt ambulated 140' with RW with min A for loss of balance x , 2/4 dyspnea, distance limited by fatigue. SaO2 96% on room air and HR 116  1 minute after walking. Assistance for mobility is recommended due to unsteady gait. Pt currently with functional limitations due to the deficits listed below (see PT Problem List). Pt will benefit from skilled PT to increase their independence and safety with mobility to allow discharge to the venue listed below.       Follow Up Recommendations Home health PT;Supervision for mobility/OOB    Equipment Recommendations  Other (comment) (rollator -4 wheeled RW with seat)    Recommendations for Other Services       Precautions / Restrictions Precautions Precautions: Fall Precaution Comments: pt denies h/o falls in past 6 months but is unsteady on eval Restrictions Weight Bearing Restrictions: No      Mobility  Bed Mobility Overal bed mobility: Modified Independent             General bed mobility comments: used rail, HOB up    Transfers Overall transfer level: Needs assistance Equipment used: None Transfers: Sit to/from Stand Sit to Stand: Min assist         General transfer comment: min A to power up  Ambulation/Gait Ambulation/Gait assistance: Min assist Gait Distance (Feet): 140 Feet Assistive device: Rolling walker (2 wheeled) Gait Pattern/deviations: Step-through pattern;Decreased stride length;Staggering left Gait velocity:  WFL   General Gait Details: pt staggered to L with a head turn requiring min A to regain balance; SaO2 96% on room air 1 minute after walking, HR 116, 2/4 dyspnea with walking  Stairs            Wheelchair Mobility    Modified Rankin (Stroke Patients Only)       Balance Overall balance assessment: Needs assistance   Sitting balance-Leahy Scale: Good     Standing balance support: Single extremity supported Standing balance-Leahy Scale: Poor Standing balance comment: relies on single UE support at minimum for static standing                             Pertinent Vitals/Pain Pain Assessment: No/denies pain    Home Living Family/patient expects to be discharged to:: Private residence Living Arrangements: Alone   Type of Home: Other(Comment) (Condo) Home Access: Stairs to enter Entrance Stairs-Rails: Right;Left Entrance Stairs-Number of Steps: 2 Home Layout: Two level;Able to live on main level with bedroom/bathroom Home Equipment: None Additional Comments: pt drives, stated she has no family/friends that could assist her at home    Prior Function Level of Independence: Independent         Comments: denies falls in past 6 months     Hand Dominance   Dominant Hand: Left    Extremity/Trunk Assessment   Upper Extremity Assessment Upper Extremity Assessment: Defer to OT evaluation    Lower Extremity Assessment Lower Extremity Assessment: Overall WFL for tasks assessed    Cervical /  Trunk Assessment Cervical / Trunk Assessment: Normal  Communication   Communication: No difficulties  Cognition Arousal/Alertness: Awake/alert Behavior During Therapy: WFL for tasks assessed/performed Overall Cognitive Status: Within Functional Limits for tasks assessed                                        General Comments      Exercises     Assessment/Plan    PT Assessment Patient needs continued PT services  PT Problem List  Decreased mobility;Decreased activity tolerance;Decreased balance;Cardiopulmonary status limiting activity       PT Treatment Interventions Therapeutic exercise;Gait training;Therapeutic activities;Functional mobility training;Balance training;Patient/family education    PT Goals (Current goals can be found in the Care Plan section)  Acute Rehab PT Goals Patient Stated Goal: get strength back PT Goal Formulation: With patient Time For Goal Achievement: 12/26/20 Potential to Achieve Goals: Good    Frequency Min 3X/week   Barriers to discharge        Co-evaluation               AM-PAC PT "6 Clicks" Mobility  Outcome Measure Help needed turning from your back to your side while in a flat bed without using bedrails?: A Little Help needed moving from lying on your back to sitting on the side of a flat bed without using bedrails?: A Little Help needed moving to and from a bed to a chair (including a wheelchair)?: A Little Help needed standing up from a chair using your arms (e.g., wheelchair or bedside chair)?: A Little Help needed to walk in hospital room?: A Little Help needed climbing 3-5 steps with a railing? : A Little 6 Click Score: 18    End of Session Equipment Utilized During Treatment: Gait belt Activity Tolerance: Patient limited by fatigue;Patient tolerated treatment well Patient left: in bed;with bed alarm set Nurse Communication: Mobility status PT Visit Diagnosis: Unsteadiness on feet (R26.81);Difficulty in walking, not elsewhere classified (R26.2)    Time: 7619-5093 PT Time Calculation (min) (ACUTE ONLY): 17 min   Charges:   PT Evaluation $PT Eval Low Complexity: 1 Low          Dawn [name] PT 12/12/2020  Acute Rehabilitation Services Pager [phone] Office [phone]

## 2020-12-12 NOTE — Hospital Course (Addendum)
80 year old woman PMH COPD, diabetes mellitus type 2, lung mass, ongoing cigarette smoker, recently hospitalized for symptomatic anemia and found to have a lung mass, bronchoscopy was planned but the patient deferred to the outpatient setting, who PRESENTED 3/31 with shortness of breath and nonproductive cough, generalized weakness.  Admitted for COPD exacerbation.  A & P  Acute COPD exacerbation complicated by ongoing cigarette smoking, emphysema --Slowly improving.  But still dyspneic on exertion.  Not back to baseline.  Continue steroids, bronchodilators  Right middle lobe lung mass seen on previous hospitalization, declined bronchoscopy at that time --Keep follow-up appointment with Dr. Lamonte Sakai 4/6 for consideration of bronchoscopy  Deconditioning, generalized weakness secondary to COPD --Home health PT.  Ambulated with physical therapy 140 feet today today, no hypoxia. --No OT follow-up  Chronic every day smoker 1 to 2 packs daily --Recommend cessation  Essential hypertension --Stable.  Diabetes mellitus type 2 hemoglobin A1c 6.5 with steroid-induced hyperglycemia. Resume glipizide and Metformin on discharge.  Actos stopped last hospitalization. --CBG 200-300s --Increase sliding scale insulin.  Add low-dose Levemir and meal coverage while on steroids.  PMH breast cancer followed by Dr. Lindi Adie --Continue anastrozole and pentoxifylline  Aortic atherosclerosis  Discharge 3/26 admitted for symptomatic anemia, severe iron deficiency with plans for outpatient follow-up with hematology.  Right lung mass seen on chest CT with plans for outpatient bronchoscopy.  Other issues included acute hypoxic respiratory failure which resolved.

## 2020-12-12 NOTE — Progress Notes (Signed)
Inpatient Diabetes Program Recommendations  AACE/ADA: New Consensus Statement on Inpatient Glycemic Control (2015)  Target Ranges:  Prepandial:   less than 140 mg/dL      Peak postprandial:   less than 180 mg/dL (1-2 hours)      Critically ill patients:  140 - 180 mg/dL   Lab Results  Component Value Date   GLUCAP 299 (H) 12/12/2020   HGBA1C 6.5 (H) 12/05/2020    Review of Glycemic Control  Results for Dawn [name], Dawn [name] (MRN [id number]) as of 12/12/2020 09:42  Ref. Range 12/12/2020 02:12 12/12/2020 07:37  Glucose-Capillary Latest Ref Range: 70 - 99 mg/dL 309 (H) 299 (H)   Diabetes history: DM 2 Outpatient Diabetes medications:  Metformin 500 mg XR bid Current orders for Inpatient glycemic control:  Novolog sensitive tid with meals and HS Solumedrol 40 mg IV q 12 hours Inpatient Diabetes Program Recommendations:    Please consider adding Levemir 5 units bid and Novolog 3 units tid with meals (hold if patient eats less than 50% or NPO).    Thanks,  Adah Perl, RN, BC-ADM Inpatient Diabetes Coordinator Pager [phone] (8a-5p)

## 2020-12-12 NOTE — Progress Notes (Signed)
PROGRESS NOTE  DEJAI SCHUBACH DGL:[id number] DOB: 1941/04/22 DOA: 12/11/2020 PCP: Lawerance Cruel, MD  Brief History   80 year old woman PMH COPD, diabetes mellitus type 2, lung mass, ongoing cigarette smoker, recently hospitalized for symptomatic anemia and found to have a lung mass, bronchoscopy was planned but the patient deferred to the outpatient setting, who PRESENTED 3/31 with shortness of breath and nonproductive cough, generalized weakness.  Admitted for COPD exacerbation.  A & P  Acute COPD exacerbation complicated by ongoing cigarette smoking, emphysema --Slowly improving.  But still dyspneic on exertion.  Not back to baseline.  Continue steroids, bronchodilators  Right middle lobe lung mass seen on previous hospitalization, declined bronchoscopy at that time --Keep follow-up appointment with Dr. Lamonte Sakai 4/6 for consideration of bronchoscopy  Deconditioning, generalized weakness secondary to COPD --Home health PT.  Ambulated with physical therapy 140 feet today today, no hypoxia. --No OT follow-up  Chronic every day smoker 1 to 2 packs daily --Recommend cessation  Essential hypertension --Stable.  Diabetes mellitus type 2 hemoglobin A1c 6.5 with steroid-induced hyperglycemia. Resume glipizide and Metformin on discharge.  Actos stopped last hospitalization. --CBG 200-300s --Increase sliding scale insulin.  Add low-dose Levemir and meal coverage while on steroids.  PMH breast cancer followed by Dr. Lindi Adie --Continue anastrozole and pentoxifylline  Aortic atherosclerosis  Discharge 3/26 admitted for symptomatic anemia, severe iron deficiency with plans for outpatient follow-up with hematology.  Right lung mass seen on chest CT with plans for outpatient bronchoscopy.  Other issues included acute hypoxic respiratory failure which resolved.  Disposition Plan:  Discussion: Back to baseline.  Continue treatments as above.  Hopefully home in the next 1 to 2 days.  Given dyspnea  on exertion, debility inpatient status.  Dispo: The patient is from: Home              Anticipated d/c is to: Home              Patient currently is not medically stable to d/c.   Difficult to place patient No  DVT prophylaxis: enoxaparin (LOVENOX) injection 40 mg Start: 12/12/20 1000   Code Status: Full Code Level of care: Med-Surg Family Communication: none  Murray Hodgkins, MD  [facility] Direct contact: see [email or web address] (further directions at bottom of note if needed) 7PM-7AM contact night coverage as at bottom of note 12/12/2020, 4:54 PM  LOS: 0 days   Significant Hospital Events   . 3/31 admit for COPD exacerbation   Consults:  . None   Procedures:  . None  Significant Diagnostic Tests:  . CXR w/ known lung mass   Micro Data:  . None    Antimicrobials:  . None   Interval History/Subjective  CC: f/u COPD exacerbation  Feels about the same, DOE, SOB at times Not back to baseline  Objective   Vitals:  Vitals:   12/12/20 1400 12/12/20 1422  BP:  (!) 138/54  Pulse:  90  Resp:  16  Temp:  98.2 F (36.8 C)  SpO2: 90% 91%    Exam:  Constitutional:   . Appears calm and comfortable ENMT:  . grossly normal hearing  Respiratory:  . CTA bilaterally, no w/r/r.  . Respiratory effort mildly increased Cardiovascular:  . RRR, no m/r/g . No LE extremity edema   Psychiatric:  . Mental status o Mood, affect appropriate  I have personally reviewed the following:   Today's Data  . CBG 200-300s . BMP unremarkable . CBC noted  Scheduled Meds: . aspirin EC  81 mg Oral Daily  . enoxaparin (LOVENOX) injection  40 mg Subcutaneous Q24H  . insulin aspart  0-5 Units Subcutaneous QHS  . insulin aspart  0-9 Units Subcutaneous TID WC  . [START ON 12/13/2020] insulin aspart  3 Units Subcutaneous TID WC  . insulin detemir  5 Units Subcutaneous QHS  . ipratropium-albuterol  3 mL Nebulization TID  . lisinopril  40 mg Oral Daily  . methylPREDNISolone  (SOLU-MEDROL) injection  40 mg Intravenous BID  . nicotine  21 mg Transdermal Daily  . pantoprazole  40 mg Oral Daily  . pentoxifylline  400 mg Oral TID WC  . rosuvastatin  40 mg Oral Daily  . verapamil  240 mg Oral Daily   Continuous Infusions:  Principal Problem:   COPD with acute exacerbation (HCC) Active Problems:   Tobacco abuse   Type 2 diabetes mellitus (HCC)   Mass of right lung   Hypertension associated with diabetes ([place])   Hyperlipidemia associated with type 2 diabetes mellitus (HCC)   Generalized weakness   Cigar smoker   Aortic atherosclerosis ([place])   LOS: 0 days   How to contact the [facility] Attending or Consulting provider [place] or covering provider during after hours [place], for this patient?  1. Check the care team in [facility] and look for a) attending/consulting [facility] provider listed and b) the [facility] team listed 2. Log into [email or web address] and use [place]'s universal password to access. If you do not have the password, please contact the hospital operator. 3. Locate the [facility] provider you are looking for under [facility] and page to a number that you can be directly reached. 4. If you still have difficulty reaching the provider, please page the [facility] (Director on Call) for the Hospitalists listed on amion for assistance.

## 2020-12-12 NOTE — ED Notes (Signed)
ED TO INPATIENT HANDOFF REPORT  Name/Age/Gender Dawn [name] 80 y.o. female  Code Status    Code Status Orders  (From admission, onward)         Start     Ordered   12/12/20 0018  Full code  Continuous        12/12/20 0020        Code Status History    Date Active Date Inactive Code Status Order ID Comments User Context   12/04/2020 2230 12/06/2020 1725 Full Code [id number]  Marcelyn Bruins, MD ED   Advance Care Planning Activity    Advance Directive Documentation   Flowsheet Row Most Recent Value  Type of Advance Directive Living will  Pre-existing out of facility DNR order (yellow form or pink MOST form) --  "MOST" Form in Place? --      Home/SNF/Other Home  Chief Complaint COPD with acute exacerbation ([place]) [J44.1]  Level of Care/Admitting Diagnosis ED Disposition    ED Disposition Condition [place]: [place] [100102]  Level of Care: Med-Surg [16]  Covid Evaluation: Asymptomatic Screening Protocol (No Symptoms)  Diagnosis: COPD with acute exacerbation [facility]) [[id number]]  Admitting Physician: Lenore Cordia [[id number]]  Attending Physician: Lenore Cordia [[id number]]       Medical History Past Medical History:  Diagnosis Date  . Anxiety   . Breast cancer of upper-outer quadrant of left female breast ([place]) 08/18/2015  . Broken wrist    when younger x2  . COPD (chronic obstructive pulmonary disease) [facility])    Hospitalization March 2011  . Depression   . Diabetes mellitus without complication ([place])   . Full dentures   . H/O vaginal delivery 1962   x1   . High cholesterol   . History of home oxygen therapy   . Hypertension   . Pneumonia   . Radiation    11/18/15- 12/16/15 to her Left Breast    Allergies Allergies  Allergen Reactions  . Ferumoxytol Shortness Of Breath, Itching and Other (See Comments)    Pt reported lightheadedness, a flushed feeling, SOB, and itching.    . Pneumococcal Vaccines Hives and  Swelling    Arm swelled up  . Repaglinide Other (See Comments)    Drops BS too low. Other reaction(s): blood sugar drop    IV Location/Drains/Wounds Patient Lines/Drains/Airways Status    Active Line/Drains/Airways    Name Placement date Placement time Site Days   Peripheral IV 12/11/20 Right Wrist 12/11/20  --  Wrist  1   Incision (Closed) 09/17/15 Breast Left 09/17/15  1203  -- 1913   Incision (Closed) 09/17/15 Axilla Left 09/17/15  1203  -- 1913          Labs/Imaging Results for orders placed or performed during the hospital encounter of 12/11/20 (from the past 48 hour(s))  Resp Panel by RT-PCR (Flu A&B, Covid) Nasopharyngeal Swab     Status: None   Collection Time: 12/11/20 12:48 AM   Specimen: Nasopharyngeal Swab; Nasopharyngeal(NP) swabs in vial transport medium  Result Value Ref Range   SARS Coronavirus 2 by RT PCR NEGATIVE NEGATIVE    Comment: (NOTE) SARS-CoV-2 target nucleic acids are NOT DETECTED.  The SARS-CoV-2 RNA is generally detectable in upper respiratory specimens during the acute phase of infection. The lowest concentration of SARS-CoV-2 viral copies this assay can detect is 138 copies/mL. A negative result does not preclude SARS-Cov-2 infection and should not be used as the sole basis for  treatment or other patient management decisions. A negative result may occur with  improper specimen collection/handling, submission of specimen other than nasopharyngeal swab, presence of viral mutation(s) within the areas targeted by this assay, and inadequate number of viral copies(<138 copies/mL). A negative result must be combined with clinical observations, patient history, and epidemiological information. The expected result is Negative.  Fact Sheet for Patients:  EntrepreneurPulse.com.au  Fact Sheet for Healthcare Providers:  IncredibleEmployment.be  This test is no t yet approved or cleared by the [place] FDA and   has been authorized for detection and/or diagnosis of SARS-CoV-2 by FDA under an Emergency Use Authorization (EUA). This EUA will remain  in effect (meaning this test can be used) for the duration of the COVID-19 declaration under Section 564(b)(1) of the Act, 21 U.S.C.section 360bbb-3(b)(1), unless the authorization is terminated  or revoked sooner.       Influenza A by PCR NEGATIVE NEGATIVE   Influenza B by PCR NEGATIVE NEGATIVE    Comment: (NOTE) The Xpert Xpress SARS-CoV-2/FLU/RSV plus assay is intended as an aid in the diagnosis of influenza from Nasopharyngeal swab specimens and should not be used as a sole basis for treatment. Nasal washings and aspirates are unacceptable for Xpert Xpress SARS-CoV-2/FLU/RSV testing.  Fact Sheet for Patients: EntrepreneurPulse.com.au  Fact Sheet for Healthcare Providers: IncredibleEmployment.be  This test is not yet approved or cleared by the [place] FDA and has been authorized for detection and/or diagnosis of SARS-CoV-2 by FDA under an Emergency Use Authorization (EUA). This EUA will remain in effect (meaning this test can be used) for the duration of the COVID-19 declaration under Section 564(b)(1) of the Act, 21 U.S.C. section 360bbb-3(b)(1), unless the authorization is terminated or revoked.  Performed at [facility], [place] [address], [place], [place] [address]   CBC     Status: Abnormal   Collection Time: 12/11/20  7:19 PM  Result Value Ref Range   WBC 8.7 4.0 - 10.5 K/uL   RBC 4.51 3.87 - 5.11 MIL/uL   Hemoglobin 11.6 (L) 12.0 - 15.0 g/dL   HCT 37.2 36.0 - 46.0 %   MCV 82.5 80.0 - 100.0 fL   MCH 25.7 (L) 26.0 - 34.0 pg   MCHC 31.2 30.0 - 36.0 g/dL   RDW 21.8 (H) 11.5 - 15.5 %   Platelets 477 (H) 150 - 400 K/uL   nRBC 0.0 0.0 - 0.2 %    Comment: Performed at [facility], [place] [address], [place], [place] [address]  Basic metabolic panel      Status: Abnormal   Collection Time: 12/11/20  7:19 PM  Result Value Ref Range   Sodium 136 135 - 145 mmol/L   Potassium 3.1 (L) 3.5 - 5.1 mmol/L   Chloride 99 98 - 111 mmol/L   CO2 27 22 - 32 mmol/L   Glucose, Bld 264 (H) 70 - 99 mg/dL    Comment: Glucose reference range applies only to samples taken after fasting for at least 8 hours.   BUN 13 8 - 23 mg/dL   Creatinine, Ser 0.91 0.44 - 1.00 mg/dL   Calcium 9.2 8.9 - 10.3 mg/dL   GFR, Estimated >60 >60 mL/min    Comment: (NOTE) Calculated using the CKD-EPI Creatinine Equation (2021)    Anion gap 10 5 - 15    Comment: Performed at [facility] [place] [address], [place], [place] [address]  Troponin I (High Sensitivity)     Status: None  Collection Time: 12/11/20  7:19 PM  Result Value Ref Range   Troponin I (High Sensitivity) 13 <18 ng/L    Comment: (NOTE) Elevated high sensitivity troponin I (hsTnI) values and significant  changes across serial measurements may suggest ACS but many other  chronic and acute conditions are known to elevate hsTnI results.  Refer to the "Links" section for chest pain algorithms and additional  guidance. Performed at [facility], [place] [address], [place], [place] [address]   Brain natriuretic peptide     Status: Abnormal   Collection Time: 12/11/20  7:19 PM  Result Value Ref Range   B Natriuretic Peptide 103.7 (H) 0.0 - 100.0 pg/mL    Comment: Performed at [facility], [place] [address] [place], [place] [address]  Troponin I (High Sensitivity)     Status: None   Collection Time: 12/11/20  9:45 PM  Result Value Ref Range   Troponin I (High Sensitivity) 16 <18 ng/L    Comment: (NOTE) Elevated high sensitivity troponin I (hsTnI) values and significant  changes across serial measurements may suggest ACS but many other  chronic and acute conditions are known to elevate hsTnI results.  Refer to the "Links" section for chest pain algorithms and  additional  guidance. Performed at [facility], [place] [address] [place], [place] [address]   Magnesium     Status: Abnormal   Collection Time: 12/11/20  9:45 PM  Result Value Ref Range   Magnesium 1.5 (L) 1.7 - 2.4 mg/dL    Comment: Performed at [facility], [place] [address] [place], [place] [address]  CBG monitoring, ED     Status: Abnormal   Collection Time: 12/12/20  2:12 AM  Result Value Ref Range   Glucose-Capillary 309 (H) 70 - 99 mg/dL    Comment: Glucose reference range applies only to samples taken after fasting for at least 8 hours.   DG Chest Portable 1 View  Result Date: 12/11/2020 CLINICAL DATA:  Shortness of breath EXAM: PORTABLE CHEST 1 VIEW COMPARISON:  12/04/2020 FINDINGS: Cardiac shadow is stable. The lungs are well aerated bilaterally. Persistent masslike density in the right mid lung is noted similar to that seen on prior plain film and recent CT from 12/04/2020. No other focal lung abnormality is noted. Aortic calcifications are seen. IMPRESSION: Stable right mid lung mass unchanged from prior exams 7 days previous. Electronically Signed   By: Inez Catalina M.D.   On: 12/11/2020 19:49    Pending Labs Unresulted Labs (From admission, onward)          Start     Ordered   12/12/20 8242  Basic metabolic panel  Tomorrow morning,   R        12/12/20 0020   12/12/20 0500  CBC  Tomorrow morning,   R        12/12/20 0020          Vitals/Pain Today's Vitals   12/11/20 2330 12/12/20 0000 12/12/20 0030 12/12/20 0300  BP: (!) 169/78 (!) 175/79 (!) 161/110 (!) 157/67  Pulse: 97 (!) 104 89 88  Resp:    20  Temp:    98 F (36.7 C)  TempSrc:      SpO2: 92% 94% 93% 93%  Weight:      Height:      PainSc:        Isolation Precautions Airborne and Contact precautions  Medications Medications  enoxaparin (LOVENOX) injection 40 mg (has  no administration in time range)  acetaminophen (TYLENOL) tablet 650 mg (has no  administration in time range)    Or  acetaminophen (TYLENOL) suppository 650 mg (has no administration in time range)  ondansetron (ZOFRAN) tablet 4 mg (has no administration in time range)    Or  ondansetron (ZOFRAN) injection 4 mg (has no administration in time range)  albuterol (PROVENTIL) (2.5 MG/3ML) 0.083% nebulizer solution 2.5 mg (has no administration in time range)  nicotine (NICODERM CQ - dosed in mg/24 hours) patch 21 mg (21 mg Transdermal Patch Applied 12/12/20 0214)  ipratropium-albuterol (DUONEB) 0.5-2.5 (3) MG/3ML nebulizer solution 3 mL (has no administration in time range)  methylPREDNISolone sodium succinate (SOLU-MEDROL) 40 mg/mL injection 40 mg (40 mg Intravenous Given 12/12/20 0214)  pantoprazole (PROTONIX) EC tablet 40 mg (has no administration in time range)  pentoxifylline (TRENTAL) CR tablet 400 mg (has no administration in time range)  rosuvastatin (CRESTOR) tablet 40 mg (has no administration in time range)  verapamil (CALAN-SR) CR tablet 240 mg (has no administration in time range)  aspirin EC tablet 81 mg (has no administration in time range)  insulin aspart (novoLOG) injection 0-9 Units (has no administration in time range)  insulin aspart (novoLOG) injection 0-5 Units (4 Units Subcutaneous Given 12/12/20 0215)  lisinopril (ZESTRIL) tablet 40 mg (has no administration in time range)  albuterol (PROVENTIL) (2.5 MG/3ML) 0.083% nebulizer solution 5 mg (5 mg Nebulization Given 12/11/20 1920)  ipratropium (ATROVENT) nebulizer solution 0.5 mg (0.5 mg Nebulization Given 12/11/20 1944)  predniSONE (DELTASONE) tablet 50 mg (50 mg Oral Given 12/11/20 1920)  aerochamber Z-Stat Plus/medium 1 each (1 each Other Given 12/12/20 0046)  potassium chloride (KLOR-CON) packet 40 mEq (40 mEq Oral Given 12/12/20 0214)    Mobility walks with person assist

## 2020-12-12 NOTE — Evaluation (Signed)
Occupational Therapy Evaluation Patient Details Name: Dawn [name] MRN: [id number] DOB: Nov 19, 1940 Today's Date: 12/12/2020    History of Present Illness 80 y.o. female who presents to the ED for evaluation of shortness of breath with medical history significant for COPD, T2DM, HTN, HLD, breast cancer, and tobacco use. Dx of COPD exacerbation. Pt had recent admission 3/24-3/26/22 for anemia, lung mass found on imaging, pt has outpt f/u on 12/17/20 for this.   Clinical Impression   Patient presents with generalized weakness, impaired balance, decreased activity tolerance and impaired cardiopulmonary status - with reports of mild dyspnea at rest and moderate dyspnea with activity. Patient able to perform all functional mobility  - needing min assist to stand and min guard to ambulate. Patient unsteady and needing to use hand on furniture to steady herself. Patient able to perform ADLs but needing min guard for safety due to balance and overall poor endurance. Patient's o2 sat WFL during evaluation on room air. HR up to 124 with ambulation to bathroom and back. Patient will benefit from skilled OT services while in hospital to improve deficits and learn compensatory strategies as needed in order to return PLOF.      Follow Up Recommendations  No OT follow up    Equipment Recommendations  Tub/shower seat    Recommendations for Other Services       Precautions / Restrictions Precautions Precautions: Fall Precaution Comments: pt denies h/o falls in past 6 months but is unsteady on eval Restrictions Weight Bearing Restrictions: No      Mobility Bed Mobility Overal bed mobility: Modified Independent             General bed mobility comments: used rail, HOB up    Transfers Overall transfer level: Needs assistance Equipment used: None;1 person hand held assist Transfers: Sit to/from Stand Sit to Stand: Min assist         General transfer comment: min assist to power up. Patient  unsteady and using one hand on furniture to steady herself. min guard for all standing and ambulation.    Balance Overall balance assessment: Needs assistance Sitting-balance support: No upper extremity supported Sitting balance-Leahy Scale: Good     Standing balance support: Single extremity supported Standing balance-Leahy Scale: Poor Standing balance comment: relies on single UE support at minimum for static standing                           ADL either performed or assessed with clinical judgement   ADL Overall ADL's : Needs assistance/impaired Eating/Feeding: Independent   Grooming: Min guard;Wash/dry face;Wash/dry hands;Standing   Upper Body Bathing: Independent   Lower Body Bathing: Min guard;Sit to/from stand   Upper Body Dressing : Independent   Lower Body Dressing: Min guard;Sit to/from stand   Toilet Transfer: Tour manager Toilet;Grab bars   Toileting- Water quality scientist and Hygiene: Min guard;Sit to/from stand               Vision Patient Visual Report: No change from baseline       Perception     Praxis      Pertinent Vitals/Pain Pain Assessment: No/denies pain     Hand Dominance Left   Extremity/Trunk Assessment Upper Extremity Assessment Upper Extremity Assessment: Overall WFL for tasks assessed   Lower Extremity Assessment Lower Extremity Assessment: Defer to PT evaluation   Cervical / Trunk Assessment Cervical / Trunk Assessment: Normal   Communication Communication Communication: No difficulties   Cognition  Arousal/Alertness: Awake/alert Behavior During Therapy: WFL for tasks assessed/performed Overall Cognitive Status: Within Functional Limits for tasks assessed                                     General Comments       Exercises     Shoulder Instructions      Home Living Family/patient expects to be discharged to:: Private residence Living Arrangements: Alone   Type of Home:  Other(Comment) (Condo) Home Access: Stairs to enter Entrance Stairs-Number of Steps: 2 Entrance Stairs-Rails: Right;Left Home Layout: Two level;Able to live on main level with bedroom/bathroom     Bathroom Shower/Tub: Teacher, early years/pre: Standard     Home Equipment: None   Additional Comments: pt drives, stated she has no family/friends that could assist her at home      Prior Functioning/Environment Level of Independence: Independent        Comments: denies falls in past 6 months        OT Problem List: Decreased strength;Decreased activity tolerance;Decreased knowledge of use of DME or AE;Cardiopulmonary status limiting activity      OT Treatment/Interventions: Self-care/ADL training;Therapeutic exercise;DME and/or AE instruction;Therapeutic activities;Balance training;Patient/family education    OT Goals(Current goals can be found in the care plan section) Acute Rehab OT Goals Patient Stated Goal: to be independent, return home OT Goal Formulation: With patient Time For Goal Achievement: 12/26/20 Potential to Achieve Goals: Good  OT Frequency: Min 2X/week   Barriers to D/C:            Co-evaluation              AM-PAC OT "6 Clicks" Daily Activity     Outcome Measure Help from another person eating meals?: None Help from another person taking care of personal grooming?: A Little Help from another person toileting, which includes using toliet, bedpan, or urinal?: A Little Help from another person bathing (including washing, rinsing, drying)?: A Little Help from another person to put on and taking off regular upper body clothing?: A Little Help from another person to put on and taking off regular lower body clothing?: A Little 6 Click Score: 19   End of Session Nurse Communication:  (okay to see)  Activity Tolerance: Patient limited by fatigue Patient left: in bed;with call bell/phone within reach;with bed alarm set  OT Visit Diagnosis:  Unsteadiness on feet (R26.81);Muscle weakness (generalized) (M62.81)                Time: 0712-1975 OT Time Calculation (min): 25 min Charges:  OT General Charges $OT Visit: 1 Visit OT Evaluation $OT Eval Low Complexity: 1 Low OT Treatments $Self Care/Home Management : 8-22 mins  Skylor Hughson, OTR/L [place] [phone] Pager: [place] 12/12/2020, 10:22 AM

## 2020-12-13 DIAGNOSIS — E119 Type 2 diabetes mellitus without complications: Secondary | ICD-10-CM | POA: Diagnosis not present

## 2020-12-13 DIAGNOSIS — J441 Chronic obstructive pulmonary disease with (acute) exacerbation: Secondary | ICD-10-CM | POA: Diagnosis not present

## 2020-12-13 LAB — GLUCOSE, CAPILLARY
Glucose-Capillary: 340 mg/dL — ABNORMAL HIGH (ref 70–99)
Glucose-Capillary: 195 mg/dL — ABNORMAL HIGH (ref 70–99)
Glucose-Capillary: 224 mg/dL — ABNORMAL HIGH (ref 70–99)
Glucose-Capillary: 283 mg/dL — ABNORMAL HIGH (ref 70–99)
Glucose-Capillary: 338 mg/dL — ABNORMAL HIGH (ref 70–99)

## 2020-12-13 MED ORDER — IPRATROPIUM-ALBUTEROL 20-100 MCG/ACT IN AERS
1.0000 | INHALATION_SPRAY | Freq: Four times a day (QID) | RESPIRATORY_TRACT | 2 refills | Status: DC
Start: 2020-12-13 — End: 2021-01-01

## 2020-12-13 MED ORDER — ALBUTEROL SULFATE HFA 108 (90 BASE) MCG/ACT IN AERS: 2.0000 | INHALATION_SPRAY | RESPIRATORY_TRACT | 2 refills | Status: DC | PRN

## 2020-12-13 MED ORDER — PREDNISONE 10 MG PO TABS: ORAL_TABLET | ORAL | 0 refills | Status: AC

## 2020-12-13 NOTE — Discharge Summary (Addendum)
Physician Discharge Summary  Dawn [name] UXL:[id number] DOB: 1941-07-08 DOA: 12/11/2020  PCP: Lawerance Cruel, MD  Admit date: 12/11/2020 Discharge date: 12/14/2020  Recommendations for Outpatient Follow-up:  1. Follow-up COPD exacerbation and lung mass.   Follow-up Information    Collene Gobble, MD Follow up on 12/17/2020.   Specialty: Pulmonary Disease Why: Keep appointment April 6 at 9:30 Contact information: [place] [address] [phone]        Care, [facility] Follow up.   Specialty: Home Health Services Why: Agency will provide home health physical therapy Contact information: [place] [id number] [phone]                Discharge Diagnoses: Principal diagnosis is #1 Principal Problem:   COPD with acute exacerbation ([place]) Active Problems:   Tobacco abuse   Type 2 diabetes mellitus ([place])   Mass of right lung   Hypertension associated with diabetes ([place])   Hyperlipidemia associated with type 2 diabetes mellitus ([place])   Generalized weakness   Cigar smoker   Aortic atherosclerosis ([place])   Acute exacerbation of chronic obstructive pulmonary disease (COPD) ([place])   Discharge Condition: improved Disposition: home with HHPT  Diet recommendation:  Diet Orders (From admission, onward)    Start     Ordered   12/13/20 0000  Diet Carb Modified        12/13/20 1037   12/12/20 0019  Diet heart healthy/carb modified Room service appropriate? Yes; Fluid consistency: Thin  Diet effective now       Question Answer Comment  Diet-HS Snack? Nothing   Room service appropriate? Yes   Fluid consistency: Thin      12/12/20 0020           Filed Weights   12/11/20 1830 12/12/20 0333  Weight: 61.2 kg 55 kg    HPI/Hospital Course:   80 year old woman PMH COPD, diabetes mellitus type 2, lung mass, ongoing cigarette smoker, recently hospitalized for symptomatic anemia and found to have a lung  mass, bronchoscopy was planned but the patient deferred to the outpatient setting, who PRESENTED 3/31 with shortness of breath and nonproductive cough, generalized weakness.  Admitted for COPD exacerbation.  Rapidly improved, discharged home on steroid taper.  Acute COPD exacerbation complicated by ongoing cigarette smoking, emphysema --Improved with standard care.  Right middle lobe lung mass seen on previous hospitalization, declined bronchoscopy at that time --Keep follow-up appointment with Dr. Lamonte Sakai 4/6 for consideration of bronchoscopy  Deconditioning, generalized weakness secondary to COPD --Home health PT.  Ambulated with physical therapy 140 feet today today, no hypoxia. --No OT follow-up  Chronic every day smoker 1 to 2 packs daily --Recommend cessation  Essential hypertension --Stable.  Diabetes mellitus type 2 hemoglobin A1c 6.5 with steroid-induced hyperglycemia.  --Expect to improve with steroid taper.  On discharge resume Metformin on discharge.  PMH breast cancer followed by Dr. Lindi Adie --Continue anastrozole and pentoxifylline  Aortic atherosclerosis  Significant Hospital Events    3/31 admit for COPD exacerbation  Consults:   None  Procedures:   None  Significant Diagnostic Tests:   CXR w/ known lung mass  Micro Data:   None   Antimicrobials:   None   4/2 assessment: S: CC: f/u SOB  Feels okay, seems to be breathing okay.  O: Vitals:  Vitals:   12/13/20 0533 12/13/20 0759  BP: (!) 146/108   Pulse: 83   Resp: 20   Temp:  97.9 F (36.6 C)   SpO2: 93% 94%    Constitutional:  . Appears calm and comfortable ENMT:  . grossly normal hearing  Respiratory:  . CTA bilaterally, no w/r/r.  . Respiratory effort normal. Cardiovascular:  . RRR, no m/r/g . No LE extremity edema   Psychiatric:  . Mental status o Mood, affect appropriate  CBG 200-300s  Discharge Instructions  Discharge Instructions    Diet Carb Modified    Complete by: As directed    Discharge instructions   Complete by: As directed    Call your physician or seek immediate medical attention for shortness of breath, fever, wheezing or worsening of condition.   Increase activity slowly   Complete by: As directed      Allergies as of 12/13/2020      Reactions   Ferumoxytol Shortness Of Breath, Itching, Other (See Comments)   Pt reported lightheadedness, a flushed feeling, SOB, and itching.     Pneumococcal Vaccines Hives, Swelling   Arm swelled up   Repaglinide Other (See Comments)   Drops BS too low. Other reaction(s): blood sugar drop      Medication List    TAKE these medications   albuterol 108 (90 Base) MCG/ACT inhaler Commonly known as: VENTOLIN HFA Inhale 2 puffs into the lungs every 2 (two) hours as needed for wheezing or shortness of breath. Please instruct in usage What changed: additional instructions   aspirin EC 81 MG tablet Take 81 mg by mouth daily.   dextromethorphan-guaiFENesin 30-600 MG 12hr tablet Commonly known as: MUCINEX DM Take 1 tablet by mouth 2 (two) times daily.   Ipratropium-Albuterol 20-100 MCG/ACT Aers respimat Commonly known as: COMBIVENT Inhale 1 puff into the lungs every 6 (six) hours. Please instruct in usage   metFORMIN 500 MG 24 hr tablet Commonly known as: GLUCOPHAGE-XR Take 1 tablet (500 mg total) by mouth 2 (two) times daily.   pantoprazole 40 MG tablet Commonly known as: PROTONIX Take 40 mg by mouth daily.   pentoxifylline 400 MG CR tablet Commonly known as: TRENTAL Take 1 tablet (400 mg total) by mouth 3 (three) times daily with meals.   predniSONE 10 MG tablet Commonly known as: DELTASONE Take 4 tablets (40 mg total) by mouth daily for 2 days, THEN 2 tablets (20 mg total) daily for 3 days, THEN 1 tablet (10 mg total) daily for 3 days. Start taking on: December 13, 2020   quinapril 40 MG tablet Commonly known as: ACCUPRIL Take 40 mg by mouth daily.   rosuvastatin 40 MG  tablet Commonly known as: CRESTOR Take 40 mg by mouth daily.   verapamil 240 MG CR tablet Commonly known as: CALAN-SR Take 240 mg by mouth daily.   Vitamin D3 125 MCG (5000 UT) Tabs Take 5,000 Units by mouth daily.            Durable Medical Equipment  (From admission, onward)         Start     Ordered   12/12/20 1653  For home use only DME 4 wheeled rolling walker with seat  Once       Question:  Patient needs a walker to treat with the following condition  Answer:  COPD (chronic obstructive pulmonary disease) ([place])   12/12/20 1653         Allergies  Allergen Reactions  . Ferumoxytol Shortness Of Breath, Itching and Other (See Comments)    Pt reported lightheadedness, a flushed feeling, SOB, and itching.    [place] Kitchen  Pneumococcal Vaccines Hives and Swelling    Arm swelled up  . Repaglinide Other (See Comments)    Drops BS too low. Other reaction(s): blood sugar drop    The results of significant diagnostics from this hospitalization (including imaging, microbiology, ancillary and laboratory) are listed below for reference.    Significant Diagnostic Studies: DG Chest Portable 1 View  Result Date: 12/11/2020 CLINICAL DATA:  Shortness of breath EXAM: PORTABLE CHEST 1 VIEW COMPARISON:  12/04/2020 FINDINGS: Cardiac shadow is stable. The lungs are well aerated bilaterally. Persistent masslike density in the right mid lung is noted similar to that seen on prior plain film and recent CT from 12/04/2020. No other focal lung abnormality is noted. Aortic calcifications are seen. IMPRESSION: Stable right mid lung mass unchanged from prior exams 7 days previous. Electronically Signed   By: Inez Catalina M.D.   On: 12/11/2020 19:49   Microbiology: Recent Results (from the past 240 hour(s))  Resp Panel by RT-PCR (Flu A&B, Covid) Nasopharyngeal Swab     Status: None   Collection Time: 12/04/20  7:08 PM   Specimen: Nasopharyngeal Swab; Nasopharyngeal(NP) swabs in vial transport medium   Result Value Ref Range Status   SARS Coronavirus 2 by RT PCR NEGATIVE NEGATIVE Final    Comment: (NOTE) SARS-CoV-2 target nucleic acids are NOT DETECTED.  The SARS-CoV-2 RNA is generally detectable in upper respiratory specimens during the acute phase of infection. The lowest concentration of SARS-CoV-2 viral copies this assay can detect is 138 copies/mL. A negative result does not preclude SARS-Cov-2 infection and should not be used as the sole basis for treatment or other patient management decisions. A negative result may occur with  improper specimen collection/handling, submission of specimen other than nasopharyngeal swab, presence of viral mutation(s) within the areas targeted by this assay, and inadequate number of viral copies(<138 copies/mL). A negative result must be combined with clinical observations, patient history, and epidemiological information. The expected result is Negative.  Fact Sheet for Patients:  EntrepreneurPulse.com.au  Fact Sheet for Healthcare Providers:  IncredibleEmployment.be  This test is no t yet approved or cleared by the [place] FDA and  has been authorized for detection and/or diagnosis of SARS-CoV-2 by FDA under an Emergency Use Authorization (EUA). This EUA will remain  in effect (meaning this test can be used) for the duration of the COVID-19 declaration under Section 564(b)(1) of the Act, 21 U.S.C.section 360bbb-3(b)(1), unless the authorization is terminated  or revoked sooner.       Influenza A by PCR NEGATIVE NEGATIVE Final   Influenza B by PCR NEGATIVE NEGATIVE Final    Comment: (NOTE) The Xpert Xpress SARS-CoV-2/FLU/RSV plus assay is intended as an aid in the diagnosis of influenza from Nasopharyngeal swab specimens and should not be used as a sole basis for treatment. Nasal washings and aspirates are unacceptable for Xpert Xpress SARS-CoV-2/FLU/RSV testing.  Fact Sheet for  Patients: EntrepreneurPulse.com.au  Fact Sheet for Healthcare Providers: IncredibleEmployment.be  This test is not yet approved or cleared by the [place] FDA and has been authorized for detection and/or diagnosis of SARS-CoV-2 by FDA under an Emergency Use Authorization (EUA). This EUA will remain in effect (meaning this test can be used) for the duration of the COVID-19 declaration under Section 564(b)(1) of the Act, 21 U.S.C. section 360bbb-3(b)(1), unless the authorization is terminated or revoked.  Performed at [facility], [place] [address], [place], [place] [address]   Resp Panel by RT-PCR (Flu A&B, Covid) Nasopharyngeal Swab  Status: None   Collection Time: 12/11/20 12:48 AM   Specimen: Nasopharyngeal Swab; Nasopharyngeal(NP) swabs in vial transport medium  Result Value Ref Range Status   SARS Coronavirus 2 by RT PCR NEGATIVE NEGATIVE Final    Comment: (NOTE) SARS-CoV-2 target nucleic acids are NOT DETECTED.  The SARS-CoV-2 RNA is generally detectable in upper respiratory specimens during the acute phase of infection. The lowest concentration of SARS-CoV-2 viral copies this assay can detect is 138 copies/mL. A negative result does not preclude SARS-Cov-2 infection and should not be used as the sole basis for treatment or other patient management decisions. A negative result may occur with  improper specimen collection/handling, submission of specimen other than nasopharyngeal swab, presence of viral mutation(s) within the areas targeted by this assay, and inadequate number of viral copies(<138 copies/mL). A negative result must be combined with clinical observations, patient history, and epidemiological information. The expected result is Negative.  Fact Sheet for Patients:  EntrepreneurPulse.com.au  Fact Sheet for Healthcare Providers:  IncredibleEmployment.be  This test  is no t yet approved or cleared by the [place] FDA and  has been authorized for detection and/or diagnosis of SARS-CoV-2 by FDA under an Emergency Use Authorization (EUA). This EUA will remain  in effect (meaning this test can be used) for the duration of the COVID-19 declaration under Section 564(b)(1) of the Act, 21 U.S.C.section 360bbb-3(b)(1), unless the authorization is terminated  or revoked sooner.       Influenza A by PCR NEGATIVE NEGATIVE Final   Influenza B by PCR NEGATIVE NEGATIVE Final    Comment: (NOTE) The Xpert Xpress SARS-CoV-2/FLU/RSV plus assay is intended as an aid in the diagnosis of influenza from Nasopharyngeal swab specimens and should not be used as a sole basis for treatment. Nasal washings and aspirates are unacceptable for Xpert Xpress SARS-CoV-2/FLU/RSV testing.  Fact Sheet for Patients: EntrepreneurPulse.com.au  Fact Sheet for Healthcare Providers: IncredibleEmployment.be  This test is not yet approved or cleared by the [place] FDA and has been authorized for detection and/or diagnosis of SARS-CoV-2 by FDA under an Emergency Use Authorization (EUA). This EUA will remain in effect (meaning this test can be used) for the duration of the COVID-19 declaration under Section 564(b)(1) of the Act, 21 U.S.C. section 360bbb-3(b)(1), unless the authorization is terminated or revoked.  Performed at [facility], [place] [address], [place], [place] [address]      Labs: Basic Metabolic Panel: Recent Labs  Lab 12/11/20 1919 12/11/20 2145 12/12/20 0454  NA 136  --  133*  K 3.1*  --  3.8  CL 99  --  98  CO2 27  --  22  GLUCOSE 264*  --  282*  BUN 13  --  13  CREATININE 0.91  --  1.01*  CALCIUM 9.2  --  9.1  MG  --  1.5*  --    CBC: Recent Labs  Lab 12/11/20 1919 12/12/20 0454  WBC 8.7 7.2  HGB 11.6* 11.2*  HCT 37.2 36.7  MCV 82.5 83.6  PLT 477* 426*    Recent Labs     12/04/20 1903 12/11/20 1919  BNP 96.3 103.7*   CBG: Recent Labs  Lab 12/12/20 1202 12/12/20 1641 12/12/20 2104 12/12/20 2120 12/13/20 0730  GLUCAP 331* 262* 293* 271* 340*    Principal Problem:   COPD with acute exacerbation ([facility]) Active Problems:   Tobacco abuse   Type 2 diabetes mellitus ([facility])   Mass of right lung   Hypertension  associated with diabetes ([place])   Hyperlipidemia associated with type 2 diabetes mellitus ([place])   Generalized weakness   Cigar smoker   Aortic atherosclerosis ([place])   Acute exacerbation of chronic obstructive pulmonary disease (COPD) ([place])   Time coordinating discharge: 20 minutes  Signed:  Murray Hodgkins, MD  [facility]  12/13/2020, 1:21 PM

## 2020-12-13 NOTE — TOC Progression Note (Signed)
Transition of Care [facility]) - Progression Note    Patient Details  Name: Dawn [name] MRN: [id number] Date of Birth: [birth year]  [facility] CM/SW Contact  Joaquin Courts, RN Phone Number: 12/13/2020, 11:53 AM  Clinical Narrative:    Alvis Lemmings to provide home health PT services, [employer] to deliver rollator to bedside for home use.   Expected Discharge Plan: [place] Barriers to Discharge: No Barriers Identified  Expected Discharge Plan and Services Expected Discharge Plan: [place]   Discharge Planning Services: CM Consult Post Acute Care Choice: [place] arrangements for the past 2 months: Single Family Home Expected Discharge Date: 12/13/20               DME Arranged: Gilford Rile rolling with seat DME Agency: Other - Comment [facility] A[facility]) Date DME Agency Contacted: 12/13/20 Time DME Agency Contacted: [phone] Representative spoke with at DME Agency: [place]: PT [place]: [place] Date [place]: 12/13/20 Time [place]: 1152 Representative spoke with at [place]: [place] ([place]) Interventions    Readmission Risk Interventions No flowsheet data found.

## 2020-12-13 NOTE — Progress Notes (Signed)
Pt reports weakness after using the bathroom. Increase in respirations noted with shakiness noted. Vitals and cbg  obtained. WNL  VWilliams,RN.

## 2020-12-14 DIAGNOSIS — J441 Chronic obstructive pulmonary disease with (acute) exacerbation: Secondary | ICD-10-CM | POA: Diagnosis not present

## 2020-12-14 LAB — GLUCOSE, CAPILLARY
Glucose-Capillary: 383 mg/dL — ABNORMAL HIGH (ref 70–99)
Glucose-Capillary: 217 mg/dL — ABNORMAL HIGH (ref 70–99)

## 2020-12-14 MED ORDER — ENSURE ENLIVE PO LIQD: 237.0000 mL | Freq: Two times a day (BID) | ORAL | Status: DC

## 2020-12-14 MED ORDER — ADULT MULTIVITAMIN W/MINERALS CH
1.0000 | ORAL_TABLET | Freq: Every day | ORAL | Status: DC
  Administered 2020-12-14: 1 via ORAL
  Filled 2020-12-14: qty 1

## 2020-12-14 NOTE — Progress Notes (Signed)
Initial Nutrition Assessment  DOCUMENTATION CODES:   Not applicable  INTERVENTION:   Liberalize diet to REGULAR, follow CBGs   Ensure Enlive po BID, each supplement provides 350 kcal and 20 grams of protein  MVI daily   NUTRITION DIAGNOSIS:   Increased nutrient needs related to acute illness as evidenced by estimated needs.  GOAL:   Patient will meet greater than or equal to 90% of their needs  MONITOR:   PO intake,Supplement acceptance,Weight trends,Labs,I & O's  REASON FOR ASSESSMENT:   Malnutrition Screening Tool    ASSESSMENT:   Patient with PMH significant for COPD, DM, lung mass, and essential HTN. Presents this admission with COPD exacerbation.   Unable to reach patient at this time. Last four meal completions charted as 75%, 50%, 75%, and 90%. RD to provide supplementation to maximize kcal and protein this admission.   Noted elevated CBGs. Steroids likely playing large role. Follow trends.   Weight noted to decline from 57.2 kg on 06/03/20 to 55 kg this admission. Suspect malnutrition but will need to obtain NFPE to make diagnosis.   Medications: SS novolog, levemir, solumedrol Labs: CBG [id number]  Diet Order:   Diet Order            Diet Carb Modified           Diet heart healthy/carb modified Room service appropriate? Yes; Fluid consistency: Thin  Diet effective now                 EDUCATION NEEDS:   Not appropriate for education at this time  Skin:  Skin Assessment: Reviewed RN Assessment  Last BM:  4/1  Height:   Ht Readings from Last 1 Encounters:  12/11/20 5\' 4"  (1.626 m)    Weight:   Wt Readings from Last 1 Encounters:  12/12/20 55 kg    BMI:  Body mass index is 20.81 kg/m.  Estimated Nutritional Needs:   Kcal:  1700-1900 kcal  Protein:  85-100 grams  Fluid:  >/= 1.7 L/day  Mariana Single RD, LDN Clinical Nutrition Pager listed in [place]

## 2020-12-14 NOTE — Discharge Instructions (Signed)
COPD and Physical Activity Chronic obstructive pulmonary disease (COPD) is a long-term (chronic) condition that affects the lungs. COPD is a general term that can be used to describe many different lung problems that cause lung swelling (inflammation) and limit airflow, including chronic bronchitis and emphysema. The main symptom of COPD is shortness of breath, which makes it harder to do even simple tasks. This can also make it harder to exercise and be active. Talk with your health care provider about treatments to help you breathe better and actions you can take to prevent breathing problems during physical activity. What are the benefits of exercising with COPD? Exercising regularly is an important part of a healthy lifestyle. You can still exercise and do physical activities even though you have COPD. Exercise and physical activity improve your shortness of breath by increasing blood flow (circulation). This causes your heart to pump more oxygen through your body. Moderate exercise can improve your:  Oxygen use.  Energy level.  Shortness of breath.  Strength in your breathing muscles.  Heart health.  Sleep.  Self-esteem and feelings of self-worth.  Depression, stress, and anxiety levels. Exercise can benefit everyone with COPD. The severity of your disease may affect how hard you can exercise, especially at first, but everyone can benefit. Talk with your health care provider about how much exercise is safe for you, and which activities and exercises are safe for you.   What actions can I take to prevent breathing problems during physical activity?  Sign up for a pulmonary rehabilitation program. This type of program may include: ? Education about lung diseases. ? Exercise classes that teach you how to exercise and be more active while improving your breathing. This usually involves:  Exercise using your lower extremities, such as a stationary bicycle.  About 30 minutes of exercise,  2 to 5 times per week, for 6 to 12 weeks  Strength training, such as push ups or leg lifts. ? Nutrition education. ? Group classes in which you can talk with others who also have COPD and learn ways to manage stress.  If you use an oxygen tank, you should use it while you exercise. Work with your health care provider to adjust your oxygen for your physical activity. Your resting flow rate is different from your flow rate during physical activity.  While you are exercising: ? Take slow breaths. ? Pace yourself and do not try to go too fast. ? Purse your lips while breathing out. Pursing your lips is similar to a kissing or whistling position. ? If doing exercise that uses a quick burst of effort, such as weight lifting:  Breathe in before starting the exercise.  Breathe out during the hardest part of the exercise (such as raising the weights). Where to find support You can find support for exercising with COPD from:  Your health care provider.  A pulmonary rehabilitation program.  Your local health department or community health programs.  Support groups, online or in-person. Your health care provider may be able to recommend support groups. Where to find more information You can find more information about exercising with COPD from:  American Lung Association: ClassInsider.se.  COPD Foundation: https://www.rivera.net/. Contact a health care provider if:  Your symptoms get worse.  You have chest pain.  You have nausea.  You have a fever.  You have trouble talking or catching your breath.  You want to start a new exercise program or a new activity. Summary  COPD is a general term  that can be used to describe many different lung problems that cause lung swelling (inflammation) and limit airflow. This includes chronic bronchitis and emphysema.  Exercise and physical activity improve your shortness of breath by increasing blood flow (circulation). This causes your heart to provide  more oxygen to your body.  Contact your health care provider before starting any exercise program or new activity. Ask your health care provider what exercises and activities are safe for you. This information is not intended to replace advice given to you by your health care provider. Make sure you discuss any questions you have with your health care provider. Document Revised: 12/20/2018 Document Reviewed: 09/22/2017 Elsevier Patient Education  2021 [employer].   Chronic Obstructive Pulmonary Disease  Chronic obstructive pulmonary disease (COPD) is a long-term (chronic) condition that affects the lungs. COPD is a general term that can be used to describe many different lung problems that cause lung swelling (inflammation) and limit airflow, including chronic bronchitis and emphysema. If you have COPD, your lung function will probably never return to normal. In most cases, it gets worse over time. However, there are steps you can take to slow the progression of the disease and improve your quality of life. What are the causes? This condition may be caused by:  Smoking. This is the most common cause.  Certain genes passed down through families. What increases the risk? The following factors may make you more likely to develop this condition:  Secondhand smoke from cigarettes, pipes, or cigars.  Exposure to chemicals and other irritants such as fumes and dust in the work environment.  Chronic lung conditions or infections. What are the signs or symptoms? Symptoms of this condition include:  Shortness of breath, especially during physical activity.  Chronic cough with a large amount of thick mucus. Sometimes the cough may not have any mucus (dry cough).  Wheezing.  Rapid breaths.  Gray or bluish discoloration (cyanosis) of the skin, especially in your fingers, toes, or lips.  Feeling tired (fatigue).  Weight loss.  Chest tightness.  Frequent infections.  Episodes when  breathing symptoms become much worse (exacerbations).  Swelling in the ankles, feet, or legs. This may occur in later stages of the disease. How is this diagnosed? This condition is diagnosed based on:  Your medical history.  A physical exam. You may also have tests, including:  Lung (pulmonary) function tests. This may include a spirometry test, which measures your ability to exhale properly.  Chest X-ray.  CT scan.  Blood tests. How is this treated? This condition may be treated with:  Medicines. These may include inhaled rescue medicines to treat acute exacerbations as well as long-term, or maintenance, medicines to prevent flare-ups of COPD. ? Bronchodilators help treat COPD by dilating the airways to allow increased airflow and make your breathing more comfortable. ? Steroids can reduce airway inflammation and help prevent exacerbations.  Smoking cessation. If you smoke, your health care provider may ask you to quit, and may also recommend therapy or replacement products to help you quit.  Pulmonary rehabilitation. This may involve working with a team of health care providers and specialists, such as respiratory, occupational, and physical therapists.  Exercise and physical activity. These are beneficial for nearly all people with COPD.  Nutrition therapy to gain weight, if you are underweight.  Oxygen. Supplemental oxygen therapy is only helpful if you have a low oxygen level in your blood (hypoxemia).  Lung surgery or transplant.  Palliative care. This is to help  people with COPD feel comfortable when treatment is no longer working. Follow these instructions at home: Medicines  Take over-the-counter and prescription medicines (inhaled or pills) only as told by your health care provider.  Talk to your health care provider before taking any cough or allergy medicines. You may need to avoid certain medicines that dry out your airways. Lifestyle  If you are a smoker,  the most important thing that you can do is to stop smoking. Do not use any products that contain nicotine or tobacco, such as cigarettes and e-cigarettes. If you need help quitting, ask your health care provider. Continuing to smoke will cause the disease to progress faster.  Avoid exposure to things that irritate your lungs, such as smoke, chemicals, and fumes.  Stay active, but balance activity with periods of rest. Exercise and physical activity will help you maintain your ability to do things you want to do.  Learn and use relaxation techniques to manage stress and to control your breathing.  Get the right amount of sleep and get quality sleep. Most adults need 7 or more hours per night.  Eat healthy foods. Eating smaller, more frequent meals and resting before meals may help you maintain your strength. Controlled breathing Learn and use controlled breathing techniques as directed by your health care provider. Controlled breathing techniques include:  Pursed lip breathing. Start by breathing in (inhaling) through your nose for 1 second. Then, purse your lips as if you were going to whistle and breathe out (exhale) through the pursed lips for 2 seconds.  Diaphragmatic breathing. Start by putting one hand on your abdomen just above your waist. Inhale slowly through your nose. The hand on your abdomen should move out. Then purse your lips and exhale slowly. You should be able to feel the hand on your abdomen moving in as you exhale. Controlled coughing Learn and use controlled coughing to clear mucus from your lungs. Controlled coughing is a series of short, progressive coughs. The steps of controlled coughing are: 1. Lean your head slightly forward. 2. Breathe in deeply using diaphragmatic breathing. 3. Try to hold your breath for 3 seconds. 4. Keep your mouth slightly open while coughing twice. 5. Spit any mucus out into a tissue. 6. Rest and repeat the steps once or twice as  needed. General instructions  Make sure you receive all the vaccines that your health care provider recommends, especially the pneumococcal and influenza vaccines. Preventing infection and hospitalization is very important when you have COPD.  Use oxygen therapy and pulmonary rehabilitation if directed to by your health care provider. If you require home oxygen therapy, ask your health care provider whether you should purchase a pulse oximeter to measure your oxygen level at home.  Work with your health care provider to develop a COPD action plan. This will help you know what steps to take if your condition gets worse.  Keep other chronic health conditions under control as told by your health care provider.  Avoid extreme temperature and humidity changes.  Avoid contact with people who have an illness that spreads from person to person (is contagious), such as viral infections or pneumonia.  Keep all follow-up visits as told by your health care provider. This is important. Contact a health care provider if:  You are coughing up more mucus than usual.  There is a change in the color or thickness of your mucus.  Your breathing is more labored than usual.  Your breathing is faster than usual.  You have difficulty sleeping.  You need to use your rescue medicines or inhalers more often than expected.  You have trouble doing routine activities such as getting dressed or walking around the house. Get help right away if:  You have shortness of breath while you are resting.  You have shortness of breath that prevents you from: ? Being able to talk. ? Performing your usual physical activities.  You have chest pain lasting longer than 5 minutes.  Your skin color is more blue (cyanotic) than usual.  You measure low oxygen saturations for longer than 5 minutes with a pulse oximeter.  You have a fever.  You feel too tired to breathe normally. Summary  Chronic obstructive pulmonary  disease (COPD) is a long-term (chronic) condition that affects the lungs.  Your lung function will probably never return to normal. In most cases, it gets worse over time. However, there are steps you can take to slow the progression of the disease and improve your quality of life.  Treatment for COPD may include taking medicines, quitting smoking, pulmonary rehabilitation, and changes to diet and exercise. As the disease progresses, you may need oxygen therapy, a lung transplant, or palliative care.  To help manage your condition, do not smoke, avoid exposure to things that irritate your lungs, stay up to date on all vaccines, and follow your health care provider's instructions for taking medicines. This information is not intended to replace advice given to you by your health care provider. Make sure you discuss any questions you have with your health care provider. Document Revised: 08/12/2017 Document Reviewed: 10/04/2016 Elsevier Patient Education  2021 [place].   Chronic Obstructive Pulmonary Disease Exacerbation  Chronic obstructive pulmonary disease (COPD) is a long-term (chronic) condition that affects the lungs. COPD is a general term that can be used to describe many different lung problems that cause lung swelling (inflammation) and limit airflow, including chronic bronchitis and emphysema. COPD exacerbations are episodes when breathing symptoms become much worse and require extra treatment. COPD exacerbations are usually caused by infections. Without treatment, COPD exacerbations can be severe and even life threatening. Frequent COPD exacerbations can cause further damage to the lungs. What are the causes? This condition may be caused by:  Respiratory infections, including viral and bacterial infections.  Exposure to smoke.  Exposure to air pollution, chemical fumes, or dust.  Things that give you an allergic reaction (allergens).  Not taking your usual COPD medicines as  directed.  Underlying medical problems, such as congestive heart failure or infections not involving the lungs. In many cases, the cause (trigger) of this condition is not known. What increases the risk? The following factors may make you more likely to develop this condition:  Smoking cigarettes.  Old age.  Frequent prior COPD exacerbations. What are the signs or symptoms? Symptoms of this condition include:  Increased coughing.  Increased production of mucus from your lungs (sputum).  Increased wheezing.  Increased shortness of breath.  Rapid or labored breathing.  Chest tightness.  Less energy than usual.  Sleep disruption from symptoms.  Confusion or increased sleepiness. Often these symptoms happen or get worse even with the use of medicines. How is this diagnosed? This condition is diagnosed based on:  Your medical history.  A physical exam. You may also have tests, including:  A chest X-ray.  Blood tests.  Lung (pulmonary) function tests. How is this treated? Treatment for this condition depends on the severity and cause of the symptoms. You may  need to be admitted to a hospital for treatment. Some of the treatments commonly used to treat COPD exacerbations are:  Antibiotic medicines. These may be used for severe exacerbations caused by a lung infection, such as pneumonia.  Bronchodilators. These are inhaled medicines that expand the air passages and allow increased airflow.  Steroid medicines. These act to reduce inflammation in the airways. They may be given with an inhaler, taken by mouth, or given through an IV tube inserted into one of your veins.  Supplemental oxygen therapy.  Airway clearing techniques, such as noninvasive ventilation (NIV) and positive expiratory pressure (PEP). These provide respiratory support through a mask or other noninvasive device. An example of this would be using a continuous positive airway pressure (CPAP) machine to  improve delivery of oxygen into your lungs. Follow these instructions at home: Medicines  Take over-the-counter and prescription medicines only as told by your health care provider. It is important to use correct technique with inhaled medicines.  If you were prescribed an antibiotic medicine or oral steroid, take it as told by your health care provider. Do not stop taking the medicine even if you start to feel better. Lifestyle  Eat a healthy diet.  Exercise regularly.  Get plenty of sleep.  Avoid exposure to all substances that irritate the airway, especially to tobacco smoke.  Wash your hands often with soap and water to reduce the risk of infection. If soap and water are not available, use hand sanitizer.  During flu season, avoid enclosed spaces that are crowded with people. General instructions  Drink enough fluid to keep your urine clear or pale yellow (unless you have a medical condition that requires fluid restriction).  Use a cool mist vaporizer. This humidifies the air and makes it easier for you to clear your chest when you cough.  If you have a home nebulizer and oxygen, continue to use them as told by your health care provider.  Keep all follow-up visits as told by your health care provider. This is important. How is this prevented?  Stay up-to-date on pneumococcal and influenza (flu) vaccines. A flu shot is recommended every year to help prevent exacerbations.  Do not use any products that contain nicotine or tobacco, such as cigarettes and e-cigarettes. Quitting smoking is very important in preventing COPD from getting worse and in preventing exacerbations from happening as often. If you need help quitting, ask your health care provider.  Follow all instructions for pulmonary rehabilitation after a recent exacerbation. This can help prevent future exacerbations.  Work with your health care provider to develop and follow an action plan. This tells you what steps to  take when you experience certain symptoms. Contact a health care provider if:  You have a worsening of your regular COPD symptoms. Get help right away if:  You have worsening shortness of breath, even when resting.  You have trouble talking.  You have severe chest pain.  You cough up blood.  You have a fever.  You have weakness, vomit repeatedly, or faint.  You feel confused.  You are not able to sleep because of your symptoms.  You have trouble doing daily activities. Summary  COPD exacerbations are episodes when breathing symptoms become much worse and require extra treatment above your normal treatment.  Exacerbations can be severe and even life threatening. Frequent COPD exacerbations can cause further damage to your lungs.  COPD exacerbations are usually triggered by infections such as the flu, colds, and even pneumonia.  Treatment  for this condition depends on the severity and cause of the symptoms. You may need to be admitted to a hospital for treatment.  Quitting smoking is very important to prevent COPD from getting worse and to prevent exacerbations from happening as often. This information is not intended to replace advice given to you by your health care provider. Make sure you discuss any questions you have with your health care provider. Document Revised: 08/12/2017 Document Reviewed: 10/04/2016 Elsevier Patient Education  2021 [place] for Chronic Obstructive Pulmonary Disease Chronic obstructive pulmonary disease (COPD) causes symptoms such as shortness of breath, coughing, and chest discomfort. These symptoms can make it difficult to eat enough to maintain a healthy weight. Generally, people with COPD should eat a diet that is high in calories, protein, and other nutrients to maintain body weight and to keep the lungs as healthy as possible. Depending on the medicines you take and other health conditions you may have, your health care  provider may give you additional recommendations on what to eat or avoid. Talk with your health care provider about your goals for body weight, and work with a dietitian to develop an eating plan that is right for you. What are tips for following this plan? Reading food labels  Avoid foods with more than 300 milligrams (mg) of salt (sodium) per serving.  Choose foods that contain at least 4 grams (g) of fiber per serving. Try to eat 20-30 g of fiber each day.  Choose foods that are high in calories and protein, such as nuts, beans, yogurt, and cheese.   Shopping  Do not buy foods labeled as diet, low-calorie, or low-fat.  If you are able to eat dairy products: ? Avoid low-fat or skim milk. ? Buy dairy products that have at least 2% fat.  Buy nutritional supplement drinks.  Buy grains and prepared foods labeled as enriched or fortified.  Consider buying low-sodium, pre-made foods to conserve energy for eating. Cooking  Add dry milk or protein powder to smoothies.  Cook with healthy fats, such as olive oil, canola oil, sunflower oil, and grapeseed oil.  Add oil, butter, cream cheese, or nut butters to foods to increase fat and calories.  To make foods easier to chew and swallow: ? Cook vegetables, pasta, and rice until soft. ? Cut or grind meat into very small pieces. ? Dip breads in liquid. Meal planning  Eat when you feel hungry.  Eat 5-6 small meals throughout the day.  Drink 6-8 glasses of water each day.  Do not drink liquids with meals. Drink liquids at the end of the meal to avoid feeling full too quickly.  Eat a variety of fruits and vegetables every day.  Ask for assistance from family or friends with planning and preparing meals as needed.  Avoid foods that cause you to feel bloated, such as carbonated drinks, fried foods, beans, broccoli, cabbage, and apples.  For older adults, ask your local agency on aging whether you are eligible for meal assistance  programs, such as Meals on Wheels.   Lifestyle  Do not smoke.  Eat slowly. Take small bites and chew food well before swallowing.  Do not overeat. This may make it more difficult to breathe after eating.  Sit up while eating.  If needed, continue to use supplemental oxygen while eating.  Rest or relax for 30 minutes before and after eating.  Monitor your weight as told by your health care provider.  Exercise as  told by your health care provider.   What foods should I eat? Fruits All fresh, dried, canned, or frozen fruits that do not cause gas. Vegetables All fresh, canned (no salt added), or frozen vegetables that do not cause gas. Grains Whole-grain bread. Enriched whole-grain pasta. Fortified whole-grain cereals. Fortified rice. Quinoa. Meats and other proteins Lean meat. Poultry. Fish. Dried beans. Unsalted nuts. Tofu. Eggs. Nut butters. Dairy Whole or 2% milk. Cheese. Yogurt. Fats and oils Olive oil. Canola oil. Butter. Margarine. Beverages Water. Vegetable juice (no salt added). Decaffeinated coffee. Decaffeinated or herbal tea. Seasonings and condiments Fresh or dried herbs. Low-salt or salt-free seasonings. Low-sodium soy sauce. The items listed above may not be a complete list of foods and beverages you can eat. Contact a dietitian for more information. What foods should I avoid? Fruits Fruits that cause gas, such as apples or melon. Vegetables Vegetables that cause gas, such as broccoli, Brussels sprouts, cabbage, cauliflower, and onions. Canned vegetables with added salt. Meats and other proteins Fried meat. Salt-cured meat. Processed meat. Dairy Fat-free or low-fat milk, yogurt, or cheese. Processed cheese. Beverages Carbonated drinks. Caffeinated drinks, such as coffee, tea, and soft drinks. Juice. Alcohol. Vegetable juice with added salt. Seasonings and condiments Salt. Seasoning mixes with salt. Soy sauce. Angie Fava. Other foods Clear soup or broth. Fried  foods. Prepared frozen meals. The items listed above may not be a complete list of foods and beverages you should avoid. Contact a dietitian for more information. Summary  COPD symptoms can make it difficult to eat enough to maintain a healthy weight.  A COPD eating plan can help you maintain your body weight and keep your lungs as healthy as possible.  Eat a diet that is high in calories, protein, and other nutrients. Read labels to make sure that you are getting the right nutrients. Cook foods to make them easier to chew and swallow.  Eat 5-6 small meals throughout the day, and avoid foods that cause gas or make you feel bloated. This information is not intended to replace advice given to you by your health care provider. Make sure you discuss any questions you have with your health care provider. Document Revised: 07/08/2020 Document Reviewed: 07/08/2020 Elsevier Patient Education  2021 [employer].

## 2020-12-14 NOTE — Progress Notes (Addendum)
PROGRESS NOTE  Dawn [name] MLY:[id number] DOB: [birth year] DOA: 12/11/2020 PCP: Lawerance Cruel, MD  Brief History   80 year old woman PMH COPD, diabetes mellitus type 2, lung mass, ongoing cigarette smoker, recently hospitalized for symptomatic anemia and found to have a lung mass, bronchoscopy was planned but the patient deferred to the outpatient setting, who PRESENTED 3/31 with shortness of breath and nonproductive cough, generalized weakness.  Admitted for COPD exacerbation.  Rapidly improved.  Home today.  A & P  Acute COPD exacerbation complicated by ongoing cigarette smoking, emphysema --Resolved.  Home on steroid taper.  Right middle lobe lung mass seen on previous hospitalization, declined bronchoscopy at that time --Keep follow-up appointment with Dr. Lamonte Sakai 4/6 for consideration of bronchoscopy  Deconditioning, generalized weakness secondary to COPD --Home health PT.  Ambulated with physical therapy 140 feet as an inpatient, no hypoxia. --No OT follow-up  Chronic every day smoker 1 to 2 packs daily --Recommend cessation  Essential hypertension --Stable.  Diabetes mellitus type 2 hemoglobin A1c 6.5 with steroid-induced hyperglycemia. Resume glipizide and Metformin on discharge.  Actos stopped last hospitalization. --Resume medication on discharge.  Expect blood sugars to normalize with a steroid taper.  Disposition Plan:  Discussion: Appears stable for discharge.  Mild generalized abdominal pain of unclear significance.  Exam is benign.  Follow-up as an outpatient.  Dispo: The patient is from: Home              Anticipated d/c is to: Home              Patient currently is not medically stable to d/c.   Difficult to place patient No  DVT prophylaxis: enoxaparin (LOVENOX) injection 40 mg Start: 12/12/20 1000   Code Status: Full Code Level of care: Med-Surg Family Communication: none  Murray Hodgkins, MD  [facility] Direct contact: see [email or web address] (further  directions at bottom of note if needed) 7PM-7AM contact night coverage as at bottom of note 12/14/2020, 1:06 PM  LOS: 2 days    Interval History/Subjective  CC: f/u COPD exacerbation  Feels about the same today.  Mild generalized abdominal pain.  Objective   Vitals:  Vitals:   12/14/20 1117 12/14/20 1118  BP: 139/64 139/64  Pulse:    Resp:    Temp:    SpO2:      Exam:  Constitutional:   . Appears calm and comfortable ENMT:  . grossly normal hearing  Respiratory:  . CTA bilaterally, no w/r/r.  . Respiratory effort normal.  Cardiovascular:  . RRR, no m/r/g Psychiatric:  . Mental status o Mood, affect appropriate  I have personally reviewed the following:   Today's Data  . Blood sugar stable.  BMP is unremarkable.  CBC stable.  Scheduled Meds: . aspirin EC  81 mg Oral Daily  . enoxaparin (LOVENOX) injection  40 mg Subcutaneous Q24H  . feeding supplement  237 mL Oral BID BM  . insulin aspart  0-5 Units Subcutaneous QHS  . insulin aspart  0-9 Units Subcutaneous TID WC  . insulin aspart  3 Units Subcutaneous TID WC  . insulin detemir  5 Units Subcutaneous QHS  . ipratropium-albuterol  3 mL Nebulization TID  . lisinopril  40 mg Oral Daily  . multivitamin with minerals  1 tablet Oral Daily  . nicotine  21 mg Transdermal Daily  . pantoprazole  40 mg Oral Daily  . pentoxifylline  400 mg Oral TID WC  . rosuvastatin  40 mg Oral Daily  . verapamil  240  mg Oral Daily   Continuous Infusions:  Principal Problem:   COPD with acute exacerbation (HCC) Active Problems:   Tobacco abuse   Type 2 diabetes mellitus (HCC)   Mass of right lung   Hypertension associated with diabetes ([place])   Hyperlipidemia associated with type 2 diabetes mellitus (HCC)   Generalized weakness   Cigar smoker   Aortic atherosclerosis (HCC)   Acute exacerbation of chronic obstructive pulmonary disease (COPD) ([place])   LOS: 2 days   How to contact the [facility] Attending or Consulting provider [place] or covering provider during after hours [place], for this patient?  1. Check the care team in [facility] and look for a) attending/consulting [facility] provider listed and b) the [facility] team listed 2. Log into [email or web address] and use [place]'s universal password to access. If you do not have the password, please contact the hospital operator. 3. Locate the [facility] provider you are looking for under [facility] and page to a number that you can be directly reached. 4. If you still have difficulty reaching the provider, please page the [facility] (Director on Call) for the Hospitalists listed on amion for assistance.

## 2020-12-14 NOTE — Progress Notes (Signed)
Discharge instructions explained to Patient's son, he is the patient's legal guardian. Prescriptions to be picked up by son at [facility]. Dawn [name] also had her walker to take home with her. She ambulated with standby assist. Dawn [name] was discharged to son via wheelchair.

## 2020-12-16 DIAGNOSIS — F32A Depression, unspecified: Secondary | ICD-10-CM | POA: Diagnosis not present

## 2020-12-16 DIAGNOSIS — J439 Emphysema, unspecified: Secondary | ICD-10-CM | POA: Diagnosis not present

## 2020-12-16 DIAGNOSIS — I152 Hypertension secondary to endocrine disorders: Secondary | ICD-10-CM | POA: Diagnosis not present

## 2020-12-16 DIAGNOSIS — I700 Atherosclerosis of aorta: Secondary | ICD-10-CM | POA: Diagnosis not present

## 2020-12-16 DIAGNOSIS — D509 Iron deficiency anemia, unspecified: Secondary | ICD-10-CM | POA: Diagnosis not present

## 2020-12-16 DIAGNOSIS — E7800 Pure hypercholesterolemia, unspecified: Secondary | ICD-10-CM | POA: Diagnosis not present

## 2020-12-16 DIAGNOSIS — E782 Mixed hyperlipidemia: Secondary | ICD-10-CM | POA: Diagnosis not present

## 2020-12-16 DIAGNOSIS — M170 Bilateral primary osteoarthritis of knee: Secondary | ICD-10-CM | POA: Diagnosis not present

## 2020-12-16 DIAGNOSIS — I519 Heart disease, unspecified: Secondary | ICD-10-CM | POA: Diagnosis not present

## 2020-12-16 DIAGNOSIS — E1169 Type 2 diabetes mellitus with other specified complication: Secondary | ICD-10-CM | POA: Diagnosis not present

## 2020-12-16 DIAGNOSIS — E1159 Type 2 diabetes mellitus with other circulatory complications: Secondary | ICD-10-CM | POA: Diagnosis not present

## 2020-12-17 ENCOUNTER — Other Ambulatory Visit: Payer: Self-pay

## 2020-12-17 ENCOUNTER — Ambulatory Visit: Payer: Medicare Other | Admitting: Emergency Medicine

## 2020-12-17 ENCOUNTER — Encounter: Payer: Self-pay | Admitting: Emergency Medicine

## 2020-12-17 VITALS — BP 118/68 | HR 104 | Temp 97.9°F | Ht 64.0 in | Wt 122.2 lb

## 2020-12-17 DIAGNOSIS — I10 Essential (primary) hypertension: Secondary | ICD-10-CM

## 2020-12-17 DIAGNOSIS — R918 Other nonspecific abnormal finding of lung field: Secondary | ICD-10-CM | POA: Diagnosis not present

## 2020-12-17 DIAGNOSIS — Z720 Tobacco use: Secondary | ICD-10-CM

## 2020-12-17 DIAGNOSIS — J449 Chronic obstructive pulmonary disease, unspecified: Secondary | ICD-10-CM | POA: Diagnosis not present

## 2020-12-17 MED ORDER — STIOLTO RESPIMAT 2.5-2.5 MCG/ACT IN AERS: 2.0000 | INHALATION_SPRAY | Freq: Every day | RESPIRATORY_TRACT | 0 refills | Status: DC

## 2020-12-17 NOTE — Assessment & Plan Note (Signed)
With recent exacerbation.  She is on Combivent, unclear whether she takes it reliably due to her dementia.  She cannot tell if she does.  I think she benefit from change to long-acting medication  Stop Combivent Start Stiolto 2 puffs once daily.  Take this medication every day on a schedule as maintenance to help your breathing Keep your albuterol available to use 2 puffs up to every 4 hours if needed for shortness of breath, chest tightness, wheezing.

## 2020-12-17 NOTE — Assessment & Plan Note (Signed)
Need work harder on cessation going forward.  Will address at her next visit.

## 2020-12-17 NOTE — Patient Instructions (Addendum)
We will work on setting up a bronchoscopy to evaluate your right lung mass.  We will tentatively schedule this for 01/05/2021.  You will need a designated driver and someone to watch you at home overnight after the procedure. We will arrange for a cardiology evaluation to ensure that we understand your risk for bronchoscopy, anesthesia, before we proceed. Stop Combivent Start Stiolto 2 puffs once daily.  Take this medication every day on a schedule as maintenance to help your breathing Keep your albuterol available to use 2 puffs up to every 4 hours if needed for shortness of breath, chest tightness, wheezing. Work on decreasing your smoking Follow with Dr Lamonte Sakai in 1 month

## 2020-12-17 NOTE — Assessment & Plan Note (Signed)
Right middle lobe mass found on her recent hospitalization.  Almost certainly primary malignancy.  She needs bronchoscopy either with or without navigation.  I would like to do this under general anesthesia if possible which would facilitate navigation.  I think she needs cardiac risk ratification first and I will make that referral.  I will try to get her scheduled for 01/05/2021 for bronchoscopy.  Hopefully we can assess her cardiac risk before then.  We will work on setting up a bronchoscopy to evaluate your right lung mass.  We will tentatively schedule this for 01/05/2021.  You will need a designated driver and someone to watch you at home overnight after the procedure. We will arrange for a cardiology evaluation to ensure that we understand your risk for bronchoscopy, anesthesia, before we proceed. Follow with Dr Lamonte Sakai in 1 month

## 2020-12-17 NOTE — Progress Notes (Signed)
Subjective:    Patient ID: Dawn [name], female    DOB: 21-Nov-1940, 80 y.o.   MRN: [id number]  HPI 80 year old active smoker (70 pack years) with history of breast cancer (left partial mastectomy 2017, XRT, maintained on anastrozole), hypertension, hyperlipidemia, diabetes type 2, anxiety, COPD.  I will have any PFT available.  She was seen by our group when she was admitted end of March 2022 with weakness, poor p.o. intake, memory loss and dyspnea, found to be anemic.  Chest imaging was performed that revealed a right middle lobe mass.  Bronchoscopy was discussed and decision was made to defer until we could meet here in the office.  She is improved since the hospital - able to ambulate, less shakiness. Still w poor appetite, her breathing is improved.   CT chest 12/04/2020 reviewed by me showed a 6 x 3.3 cm low attenuating right middle lobe mass extending from the hilum to the lateral pleural surface superimposed on background emphysema.  There is also a 1 cm pleural-based nodule.  No mediastinal or hilar lymphadenopathy.  MRI brain 12/06/2020 reviewed by me, showed no evidence of intracranial metastatic disease, some chronic small vessel ischemia and remote lacunar infarcts   Review of Systems as per HPI  Past Medical History:  Diagnosis Date  . Anxiety   . Breast cancer of upper-outer quadrant of left female breast ([place]) 08/18/2015  . Broken wrist    when younger x2  . COPD (chronic obstructive pulmonary disease) [facility])    Hospitalization March 2011  . Depression   . Diabetes mellitus without complication ([place])   . Full dentures   . H/O vaginal delivery 1962   x1   . High cholesterol   . History of home oxygen therapy   . Hypertension   . Pneumonia   . Radiation    11/18/15- 12/16/15 to her Left Breast     Family History  Problem Relation Age of Onset  . Colon cancer Sister   . Lung cancer Sister      Social History   Socioeconomic History  . Marital status: Legally  Separated    Spouse name: Not on file  . Number of children: Not on file  . Years of education: Not on file  . Highest education level: Not on file  Occupational History  . Not on file  Tobacco Use  . Smoking status: Current Every Day Smoker    Packs/day: 1.00    Years: 70.00    Pack years: 70.00    Types: Cigarettes  . Smokeless tobacco: Never Used  . Tobacco comment: less then 1 pack smoked per day 12/17/20 [facility]   Vaping Use  . Vaping Use: Never used  Substance and Sexual Activity  . Alcohol use: Yes    Comment: rare  . Drug use: No  . Sexual activity: Not on file  Other Topics Concern  . Not on file  Social History Narrative  . Not on file   Social Determinants of Health   Financial Resource Strain: Not on file  Food Insecurity: Not on file  Transportation Needs: Not on file  Physical Activity: Not on file  Stress: Not on file  Social Connections: Not on file  Intimate Partner Violence: Not on file     Allergies  Allergen Reactions  . Ferumoxytol Shortness Of Breath, Itching and Other (See Comments)    Pt reported lightheadedness, a flushed feeling, SOB, and itching.    . Pneumococcal Vaccines Hives and Swelling  Arm swelled up  . Repaglinide Other (See Comments)    Drops BS too low. Other reaction(s): blood sugar drop     Outpatient Medications Prior to Visit  Medication Sig Dispense Refill  . albuterol (VENTOLIN HFA) 108 (90 Base) MCG/ACT inhaler Inhale 2 puffs into the lungs every 2 (two) hours as needed for wheezing or shortness of breath. Please instruct in usage 1 each 2  . aspirin EC 81 MG tablet Take 81 mg by mouth daily.    . Cholecalciferol (VITAMIN D3) 5000 UNITS TABS Take 5,000 Units by mouth daily.    . Ipratropium-Albuterol (COMBIVENT) 20-100 MCG/ACT AERS respimat Inhale 1 puff into the lungs every 6 (six) hours. Please instruct in usage 1 each 2  . metFORMIN (GLUCOPHAGE-XR) 500 MG 24 hr tablet Take 1 tablet (500 mg total) by mouth 2 (two) times  daily. 60 tablet 2  . pantoprazole (PROTONIX) 40 MG tablet Take 40 mg by mouth daily.    . pentoxifylline (TRENTAL) 400 MG CR tablet Take 1 tablet (400 mg total) by mouth 3 (three) times daily with meals.    . quinapril (ACCUPRIL) 40 MG tablet Take 40 mg by mouth daily.     . rosuvastatin (CRESTOR) 40 MG tablet Take 40 mg by mouth daily.    . verapamil (CALAN-SR) 240 MG CR tablet Take 240 mg by mouth daily.    Marland Kitchen dextromethorphan-guaiFENesin (MUCINEX DM) 30-600 MG 12hr tablet Take 1 tablet by mouth 2 (two) times daily. (Patient not taking: Reported on 12/17/2020) 60 tablet 0  . predniSONE (DELTASONE) 10 MG tablet Take 4 tablets (40 mg total) by mouth daily for 2 days, THEN 2 tablets (20 mg total) daily for 3 days, THEN 1 tablet (10 mg total) daily for 3 days. 17 tablet 0   No facility-administered medications prior to visit.        Objective:   Physical Exam Vitals:   12/17/20 0930  BP: 118/68  Pulse: (!) 104  Temp: 97.9 F (36.6 C)  TempSrc: Temporal  SpO2: 93%  Weight: 122 lb 3.2 oz (55.4 kg)  Height: 5\' 4"  (1.626 m)   Gen: Pleasant, well-nourished, in no distress,  normal affect  ENT: No lesions,  mouth clear,  oropharynx clear, no postnasal drip  Neck: No JVD, no stridor  Lungs: No use of accessory muscles, no crackles or wheezing on normal respiration, no wheeze on forced expiration  Cardiovascular: RRR, heart sounds normal, no murmur or gallops, no peripheral edema  Musculoskeletal: No deformities, no cyanosis or clubbing  Neuro: alert, awake, very poor memory, poor historian.   Skin: Warm, no lesions or rash      Assessment & Plan:  Mass of right lung Right middle lobe mass found on her recent hospitalization.  Almost certainly primary malignancy.  She needs bronchoscopy either with or without navigation.  I would like to do this under general anesthesia if possible which would facilitate navigation.  I think she needs cardiac risk ratification first and I will make  that referral.  I will try to get her scheduled for 01/05/2021 for bronchoscopy.  Hopefully we can assess her cardiac risk before then.  We will work on setting up a bronchoscopy to evaluate your right lung mass.  We will tentatively schedule this for 01/05/2021.  You will need a designated driver and someone to watch you at home overnight after the procedure. We will arrange for a cardiology evaluation to ensure that we understand your risk for bronchoscopy, anesthesia, before  we proceed. Follow with Dr Lamonte Sakai in 1 month  COPD (chronic obstructive pulmonary disease) ([place]) With recent exacerbation.  She is on Combivent, unclear whether she takes it reliably due to her dementia.  She cannot tell if she does.  I think she benefit from change to long-acting medication  Stop Combivent Start Stiolto 2 puffs once daily.  Take this medication every day on a schedule as maintenance to help your breathing Keep your albuterol available to use 2 puffs up to every 4 hours if needed for shortness of breath, chest tightness, wheezing.  Tobacco abuse Need work harder on cessation going forward.  Will address at her next visit.     Baltazar Apo, MD, PhD 12/17/2020, 1:33 PM [facility] [phone] or if no answer before 7:00PM call [phone] For any issues after 7:00PM please call eLink [phone]

## 2020-12-17 NOTE — Addendum Note (Signed)
Addended by: Collene Gobble on: 12/17/2020 04:50 PM   Modules accepted: Orders

## 2020-12-18 ENCOUNTER — Telehealth: Payer: Self-pay | Admitting: Emergency Medicine

## 2020-12-18 DIAGNOSIS — R918 Other nonspecific abnormal finding of lung field: Secondary | ICD-10-CM

## 2020-12-18 NOTE — Telephone Encounter (Signed)
Thank you :)

## 2020-12-18 NOTE — Telephone Encounter (Signed)
Per [place] w/ [facility], pt's CT from 3/24 will not work for the cuts you are looking for.  Will you please order a Super D CT for this patient?

## 2020-12-18 NOTE — Telephone Encounter (Signed)
Order has been placed for a Super D

## 2020-12-19 ENCOUNTER — Encounter: Payer: Self-pay | Admitting: Emergency Medicine

## 2020-12-19 NOTE — Telephone Encounter (Addendum)
The following has been scheduled & pt is aware:  Super D CT 4/21 @ 1 PM @ [facility] - they will send disk to [facility] 4/22 @ 10:30 AM ENB  4/25 @ 9:15 AM

## 2020-12-23 DIAGNOSIS — E1169 Type 2 diabetes mellitus with other specified complication: Secondary | ICD-10-CM | POA: Diagnosis not present

## 2020-12-23 DIAGNOSIS — J439 Emphysema, unspecified: Secondary | ICD-10-CM | POA: Diagnosis not present

## 2020-12-23 DIAGNOSIS — E1159 Type 2 diabetes mellitus with other circulatory complications: Secondary | ICD-10-CM | POA: Diagnosis not present

## 2020-12-23 DIAGNOSIS — I152 Hypertension secondary to endocrine disorders: Secondary | ICD-10-CM | POA: Diagnosis not present

## 2020-12-23 DIAGNOSIS — I519 Heart disease, unspecified: Secondary | ICD-10-CM | POA: Diagnosis not present

## 2020-12-25 ENCOUNTER — Other Ambulatory Visit: Payer: Self-pay

## 2020-12-25 ENCOUNTER — Telehealth: Payer: Self-pay | Admitting: Emergency Medicine

## 2020-12-25 ENCOUNTER — Emergency Department (HOSPITAL_COMMUNITY)
Admission: EM | Admit: 2020-12-25 | Discharge: 2020-12-25 | Disposition: A | Discharge status: Home / Self Care | Payer: Medicare Other | Attending: Emergency Medicine | Admitting: Emergency Medicine

## 2020-12-25 ENCOUNTER — Emergency Department (HOSPITAL_COMMUNITY): Payer: Medicare Other

## 2020-12-25 DIAGNOSIS — R079 Chest pain, unspecified: Secondary | ICD-10-CM | POA: Diagnosis not present

## 2020-12-25 DIAGNOSIS — Z853 Personal history of malignant neoplasm of breast: Secondary | ICD-10-CM | POA: Diagnosis not present

## 2020-12-25 DIAGNOSIS — Z7984 Long term (current) use of oral hypoglycemic drugs: Secondary | ICD-10-CM | POA: Insufficient documentation

## 2020-12-25 DIAGNOSIS — I499 Cardiac arrhythmia, unspecified: Secondary | ICD-10-CM | POA: Diagnosis not present

## 2020-12-25 DIAGNOSIS — Z7982 Long term (current) use of aspirin: Secondary | ICD-10-CM | POA: Diagnosis not present

## 2020-12-25 DIAGNOSIS — Z20822 Contact with and (suspected) exposure to covid-19: Secondary | ICD-10-CM | POA: Diagnosis not present

## 2020-12-25 DIAGNOSIS — R0789 Other chest pain: Secondary | ICD-10-CM | POA: Diagnosis not present

## 2020-12-25 DIAGNOSIS — F17210 Nicotine dependence, cigarettes, uncomplicated: Secondary | ICD-10-CM | POA: Diagnosis not present

## 2020-12-25 DIAGNOSIS — R918 Other nonspecific abnormal finding of lung field: Secondary | ICD-10-CM | POA: Insufficient documentation

## 2020-12-25 DIAGNOSIS — R6889 Other general symptoms and signs: Secondary | ICD-10-CM | POA: Diagnosis not present

## 2020-12-25 DIAGNOSIS — E119 Type 2 diabetes mellitus without complications: Secondary | ICD-10-CM | POA: Insufficient documentation

## 2020-12-25 DIAGNOSIS — Z79899 Other long term (current) drug therapy: Secondary | ICD-10-CM | POA: Insufficient documentation

## 2020-12-25 DIAGNOSIS — Z743 Need for continuous supervision: Secondary | ICD-10-CM | POA: Diagnosis not present

## 2020-12-25 DIAGNOSIS — I10 Essential (primary) hypertension: Secondary | ICD-10-CM | POA: Diagnosis not present

## 2020-12-25 DIAGNOSIS — J449 Chronic obstructive pulmonary disease, unspecified: Secondary | ICD-10-CM | POA: Insufficient documentation

## 2020-12-25 DIAGNOSIS — R0602 Shortness of breath: Secondary | ICD-10-CM | POA: Diagnosis present

## 2020-12-25 DIAGNOSIS — J441 Chronic obstructive pulmonary disease with (acute) exacerbation: Secondary | ICD-10-CM | POA: Diagnosis not present

## 2020-12-25 LAB — CBC
HCT: 37.1 % (ref 36.0–46.0)
Hemoglobin: 11.7 g/dL — ABNORMAL LOW (ref 12.0–15.0)
MCH: 26.4 pg (ref 26.0–34.0)
MCHC: 31.5 g/dL (ref 30.0–36.0)
MCV: 83.7 fL (ref 80.0–100.0)
Platelets: 334 10*3/uL (ref 150–400)
RBC: 4.43 MIL/uL (ref 3.87–5.11)
RDW: 22.7 % — ABNORMAL HIGH (ref 11.5–15.5)
WBC: 9.7 10*3/uL (ref 4.0–10.5)
nRBC: 0 % (ref 0.0–0.2)

## 2020-12-25 LAB — BASIC METABOLIC PANEL
Anion gap: 9 (ref 5–15)
BUN: 12 mg/dL (ref 8–23)
CO2: 27 mmol/L (ref 22–32)
Calcium: 9.2 mg/dL (ref 8.9–10.3)
Chloride: 95 mmol/L — ABNORMAL LOW (ref 98–111)
Creatinine, Ser: 1.28 mg/dL — ABNORMAL HIGH (ref 0.44–1.00)
GFR, Estimated: 43 mL/min — ABNORMAL LOW (ref >60)
Glucose, Bld: 292 mg/dL — ABNORMAL HIGH (ref 70–99)
Potassium: 4 mmol/L (ref 3.5–5.1)
Sodium: 131 mmol/L — ABNORMAL LOW (ref 135–145)

## 2020-12-25 LAB — RESP PANEL BY RT-PCR (FLU A&B, COVID) ARPGX2
Influenza A by PCR: NEGATIVE
Influenza B by PCR: NEGATIVE
SARS Coronavirus 2 by RT PCR: NEGATIVE

## 2020-12-25 LAB — TROPONIN I (HIGH SENSITIVITY)
Troponin I (High Sensitivity): 12 ng/L (ref <18)
Troponin I (High Sensitivity): 12 ng/L (ref <18)

## 2020-12-25 LAB — D-DIMER, QUANTITATIVE: D-Dimer, Quant: 0.35 ug/mL-FEU (ref 0.00–0.50)

## 2020-12-25 LAB — BRAIN NATRIURETIC PEPTIDE: B Natriuretic Peptide: 48.2 pg/mL (ref 0.0–100.0)

## 2020-12-25 MED ORDER — PREDNISONE 10 MG PO TABS: ORAL_TABLET | ORAL | 0 refills | Status: AC

## 2020-12-25 MED ORDER — METHYLPREDNISOLONE SODIUM SUCC 125 MG IJ SOLR
125.0000 mg | Freq: Once | INTRAMUSCULAR | Status: AC
  Administered 2020-12-25: 125 mg via INTRAVENOUS
  Filled 2020-12-25: qty 2

## 2020-12-25 MED ORDER — ALBUTEROL SULFATE (2.5 MG/3ML) 0.083% IN NEBU
5.0000 mg | INHALATION_SOLUTION | Freq: Once | RESPIRATORY_TRACT | Status: AC
  Administered 2020-12-25: 5 mg via RESPIRATORY_TRACT
  Filled 2020-12-25: qty 6

## 2020-12-25 MED ORDER — IPRATROPIUM BROMIDE 0.02 % IN SOLN
0.5000 mg | Freq: Once | RESPIRATORY_TRACT | Status: AC
  Administered 2020-12-25: 0.5 mg via RESPIRATORY_TRACT
  Filled 2020-12-25: qty 2.5

## 2020-12-25 NOTE — Discharge Instructions (Signed)
Continue your inhaler treatments.  Take the prednisone to help with the lung inflammation.  Follow-up with your pulmonary doctor as planned to continue the evaluation of the lung mass

## 2020-12-25 NOTE — ED Triage Notes (Addendum)
Pt came via ems due to chest pain. EMS states EKG is normal. Pt states she has chest pain all the time. Pt states she smokes a pack a day. Pt is axox4 and stable at this time.

## 2020-12-25 NOTE — Telephone Encounter (Signed)
Spoke with the pt's son [name]. Wanted to confirm that we are working on getting her a cardiology appointment. He tells me that they are working on it, don;t have a date set yet.

## 2020-12-25 NOTE — ED Provider Notes (Signed)
[facility]ERG[facility] DEPARTMENT Provider Note   CSN: [id number] Arrival date & time: 12/25/20  1712     History Chief Complaint  Patient presents with  . Chest Pain    Dawn [name] is a 80 y.o. female.  HPI   Patient presented to the ED for evaluation of shortness of breath.  Patient states she was at home today watching TV when she suddenly started to feel short of breath 30 minutes to an hour before she called the ambulance.  Per the EMS report the patient indicated she was having chest pain.  However patient tells me she is not having any chest pain or chest pressure.  She feels like her breathing is getting better now.  She does not think she received any treatments by EMS.  She denies having any history of breathing problems however she tells me she has used inhalers in the past.  She thinks she might of gotten them after being hospitalized once.  She denies any fevers or chills.  No cough.  No leg swelling.  Past Medical History:  Diagnosis Date  . Anxiety   . Breast cancer of upper-outer quadrant of left female breast ([place]) 08/18/2015  . Broken wrist    when younger x2  . COPD (chronic obstructive pulmonary disease) [facility])    Hospitalization March 2011  . Depression   . Diabetes mellitus without complication ([place])   . Full dentures   . H/O vaginal delivery 1962   x1   . High cholesterol   . History of home oxygen therapy   . Hypertension   . Pneumonia   . Radiation    11/18/15- 12/16/15 to her Left Breast    Patient Active Problem List   Diagnosis Date Noted  . Generalized weakness 12/12/2020  . Cigar smoker 12/12/2020  . Aortic atherosclerosis ([place]) 12/12/2020  . Hypertension associated with diabetes ([place]) 12/11/2020  . Hyperlipidemia associated with type 2 diabetes mellitus ([place]) 12/11/2020  . Symptomatic anemia 12/04/2020  . Type 2 diabetes mellitus ([place]) 12/04/2020  . COPD (chronic obstructive pulmonary disease) ([place]) 12/04/2020  . Mass of  right lung 12/04/2020  . Tobacco abuse 09/30/2015  . Breast cancer of upper-outer quadrant of left female breast ([place]) 08/18/2015  . Essential hypertension, benign 05/22/2014  . Mixed hyperlipidemia 05/22/2014    Past Surgical History:  Procedure Laterality Date  . ABDOMINAL HYSTERECTOMY    . ABDOMINAL HYSTERECTOMY    . APPENDECTOMY    . CHOLECYSTECTOMY    . EYE SURGERY     cataracts  . RADIOACTIVE SEED GUIDED PARTIAL MASTECTOMY WITH AXILLARY SENTINEL LYMPH NODE BIOPSY Left 09/17/2015   Procedure: RADIOACTIVE SEED LOCALIZATION LEFT BREAST LUMPECTOMY AND LEFT AXILLARY SENTINEL LYMPH NODE BIOPSY;  Surgeon: Excell Seltzer, MD;  Location: [facility];  Service: General;  Laterality: Left;  . TONSILLECTOMY       OB History   No obstetric history on file.     Family History  Problem Relation Age of Onset  . Colon cancer Sister   . Lung cancer Sister     Social History   Tobacco Use  . Smoking status: Current Every Day Smoker    Packs/day: 1.00    Years: 70.00    Pack years: 70.00    Types: Cigarettes  . Smokeless tobacco: Never Used  . Tobacco comment: less then 1 pack smoked per day 12/17/20 [facility]   Vaping Use  . Vaping Use: Never used  Substance Use Topics  .  Alcohol use: Yes    Comment: rare  . Drug use: No    Home Medications Prior to Admission medications   Medication Sig Start Date End Date Taking? Authorizing Provider  predniSONE (DELTASONE) 10 MG tablet Take 4 tablets (40 mg total) by mouth daily with breakfast for 3 days, THEN 3 tablets (30 mg total) daily with breakfast for 3 days, THEN 2 tablets (20 mg total) daily with breakfast for 3 days, THEN 1 tablet (10 mg total) daily with breakfast for 3 days. 12/25/20 01/06/21 Yes Dorie Rank, MD  albuterol (VENTOLIN HFA) 108 (90 Base) MCG/ACT inhaler Inhale 2 puffs into the lungs every 2 (two) hours as needed for wheezing or shortness of breath. Please instruct in usage 12/13/20   Samuella Cota, MD  aspirin EC 81 MG tablet  Take 81 mg by mouth daily.    [provider]  Cholecalciferol (VITAMIN D3) 5000 UNITS TABS Take 5,000 Units by mouth daily.    [provider]  dextromethorphan-guaiFENesin (MUCINEX DM) 30-600 MG 12hr tablet Take 1 tablet by mouth 2 (two) times daily. Patient not taking: Reported on 12/17/2020 12/06/20 01/05/21  Terrilee Croak, MD  Ipratropium-Albuterol (COMBIVENT) 20-100 MCG/ACT AERS respimat Inhale 1 puff into the lungs every 6 (six) hours. Please instruct in usage 12/13/20   Samuella Cota, MD  metFORMIN (GLUCOPHAGE-XR) 500 MG 24 hr tablet Take 1 tablet (500 mg total) by mouth 2 (two) times daily. 12/06/20 03/06/21  Terrilee Croak, MD  pantoprazole (PROTONIX) 40 MG tablet Take 40 mg by mouth daily. 09/24/20   [provider]  pentoxifylline (TRENTAL) 400 MG CR tablet Take 1 tablet (400 mg total) by mouth 3 (three) times daily with meals. 06/03/20   Nicholas Lose, MD  quinapril (ACCUPRIL) 40 MG tablet Take 40 mg by mouth daily.     [provider]  rosuvastatin (CRESTOR) 40 MG tablet Take 40 mg by mouth daily.    [provider]  Tiotropium Bromide-Olodaterol (STIOLTO RESPIMAT) 2.5-2.5 MCG/ACT AERS Inhale 2 puffs into the lungs daily. 12/17/20   Collene Gobble, MD  verapamil (CALAN-SR) 240 MG CR tablet Take 240 mg by mouth daily. 06/10/15   [provider]    Allergies    Ferumoxytol, Pneumococcal vaccines, and Repaglinide  Review of Systems   Review of Systems  All other systems reviewed and are negative.   Physical Exam Updated Vital Signs BP (!) 117/50   Pulse (!) 57   Temp 98.2 F (36.8 C) (Oral)   Resp 18   SpO2 91%   Physical Exam Vitals and nursing note reviewed.  Constitutional:      General: She is not in acute distress.    Comments: Elderly, frail  HENT:     Head: Normocephalic and atraumatic.     Right Ear: External ear normal.     Left Ear: External ear normal.  Eyes:     General: No scleral icterus.       Right eye:  No discharge.        Left eye: No discharge.     Conjunctiva/sclera: Conjunctivae normal.  Neck:     Trachea: No tracheal deviation.  Cardiovascular:     Rate and Rhythm: Normal rate and regular rhythm.  Pulmonary:     Effort: Pulmonary effort is normal. No respiratory distress.     Breath sounds: No stridor. Wheezing present. No rales.     Comments: Occasional wheezes noted on exam, patient able speak in full sentences Abdominal:  General: Bowel sounds are normal. There is no distension.     Palpations: Abdomen is soft.     Tenderness: There is no abdominal tenderness. There is no guarding or rebound.  Musculoskeletal:        General: No tenderness.     Cervical back: Neck supple.  Skin:    General: Skin is warm and dry.     Findings: No rash.  Neurological:     Mental Status: She is alert.     Cranial Nerves: No cranial nerve deficit (no facial droop, extraocular movements intact, no slurred speech).     Sensory: No sensory deficit.     Motor: No abnormal muscle tone or seizure activity.     Coordination: Coordination normal.     ED Results / Procedures / Treatments   Labs (all labs ordered are listed, but only abnormal results are displayed) Labs Reviewed  BASIC METABOLIC PANEL - Abnormal; Notable for the following components:      Result Value   Sodium 131 (*)    Chloride 95 (*)    Glucose, Bld 292 (*)    Creatinine, Ser 1.28 (*)    GFR, Estimated 43 (*)    All other components within normal limits  CBC - Abnormal; Notable for the following components:   Hemoglobin 11.7 (*)    RDW 22.7 (*)    All other components within normal limits  RESP PANEL BY RT-PCR (FLU A&B, COVID) ARPGX2  BRAIN NATRIURETIC PEPTIDE  D-DIMER, QUANTITATIVE  TROPONIN I (HIGH SENSITIVITY)  TROPONIN I (HIGH SENSITIVITY)    EKG None  Radiology DG Chest Port 1 View  Result Date: 12/25/2020 CLINICAL DATA:  Chest pain. EXAM: PORTABLE CHEST 1 VIEW COMPARISON:  Chest radiograph December 11, 2020, chest CT December 04, 2020 FINDINGS: Calcific atherosclerotic disease of the aorta. Cardiomediastinal silhouette is normal. Mediastinal contours appear intact. Large airspace opacity in the right mid thorax extends into the pleura with associated tenting. When compared to patient's prior radiograph, the abnormality has increased in prominence. Osseous structures are without acute abnormality. Stable postsurgical changes in the left breast/left thorax. IMPRESSION: Large airspace opacity in the right mid thorax extends to the lateral pleura with associated tenting. When compared to patient's prior radiograph, the abnormality has increased in prominence. As noted on the comparison chest CT, this finding is highly suspicious for malignancy. Recommend further evaluation with PET-CT, if one has not been performed. These results were called by telephone at the time of interpretation on 12/25/2020 at 6:00 pm to provider [facility] , who verbally acknowledged these results. Electronically Signed   By: Fidela Salisbury M.D.   On: 12/25/2020 18:02    Procedures Procedures   Medications Ordered in ED Medications  albuterol (PROVENTIL) (2.5 MG/3ML) 0.083% nebulizer solution 5 mg (5 mg Nebulization Given 12/25/20 1800)  ipratropium (ATROVENT) nebulizer solution 0.5 mg (0.5 mg Nebulization Given 12/25/20 1754)  methylPREDNISolone sodium succinate (SOLU-MEDROL) 125 mg/2 mL injection 125 mg (125 mg Intravenous Given 12/25/20 1750)    ED Course  I have reviewed the triage vital signs and the nursing notes.  Pertinent labs & imaging results that were available during my care of the patient were reviewed by me and considered in my medical decision making (see chart for details).  Clinical Course as of 12/25/20 2142  Thu Dec 25, 2020  1800 Chest x-ray concerning for malignancy.  This has been noted previously and CT scan has been recommended.  I did review the patient's records  and she does have a CT scan of her  chest scheduled.  She has seen pulmonology and is getting set up also for probable bronchoscopy [JK]  2126 Second troponin is normal [JK]  2126 Labs reviewed.  Hyperglycemia and hyponatremia noted.  No indications for any emergent treatment at this [JK]    Clinical Course User Index [JK] Dorie Rank, MD   MDM Rules/Calculators/A&P                          Patient presented to the ED for evaluation of chest pain and shortness of breath.  History was provided by the patient but then her son arrived and was also able to provide some history.  Is actually familiar with patient's case as I seen her previously in the emergency room.  She unfortunately has had issues with COPD and has a known lung mass.  She is in the process of getting worked up.  She has seen Dr. Lamonte Sakai as an outpatient.  She is supposed to be getting is CT scan of the chest.  That is already scheduled.  Patient apparently was having some chest pain earlier this evening but denied having any pain during my exam.  Her serial troponins are negative and not seeing any signs of acute coronary syndrome.  I do suspect she has been having some pain in her chest from the left lung mass.  She was having some wheezing and I do think that was the cause of her initial shortness of breath.  She did improve with breathing treatments.  Patient is feeling well now and she would like to go home.  She does have close outpatient follow-up already scheduled to evaluate this lung mass concerning for malignancy. Final Clinical Impression(s) / ED Diagnoses Final diagnoses:  Chronic obstructive pulmonary disease, unspecified COPD type ([place])  Lung mass    Rx / DC Orders ED Discharge Orders         Ordered    predniSONE (DELTASONE) 10 MG tablet        12/25/20 2139           Dorie Rank, MD 12/25/20 2145

## 2020-12-30 ENCOUNTER — Telehealth: Payer: Self-pay | Admitting: Emergency Medicine

## 2020-12-30 ENCOUNTER — Telehealth: Payer: Self-pay | Admitting: Physician Assistant

## 2020-12-30 DIAGNOSIS — J439 Emphysema, unspecified: Secondary | ICD-10-CM | POA: Diagnosis not present

## 2020-12-30 DIAGNOSIS — E1169 Type 2 diabetes mellitus with other specified complication: Secondary | ICD-10-CM | POA: Diagnosis not present

## 2020-12-30 DIAGNOSIS — R918 Other nonspecific abnormal finding of lung field: Secondary | ICD-10-CM

## 2020-12-30 DIAGNOSIS — I10 Essential (primary) hypertension: Secondary | ICD-10-CM

## 2020-12-30 DIAGNOSIS — I519 Heart disease, unspecified: Secondary | ICD-10-CM | POA: Diagnosis not present

## 2020-12-30 DIAGNOSIS — I152 Hypertension secondary to endocrine disorders: Secondary | ICD-10-CM | POA: Diagnosis not present

## 2020-12-30 DIAGNOSIS — E1159 Type 2 diabetes mellitus with other circulatory complications: Secondary | ICD-10-CM | POA: Diagnosis not present

## 2020-12-30 NOTE — Telephone Encounter (Signed)
Spoke with son [name] (dpr on file), states that he has not been able to get in contact with cardiology to schedule appt for sx clearance for ENB schedule 4/25.  I called [facility] and spoke with Abbe [place], who advised that they had attempted multiple times to reach patient without success. I explained how we were trying to get sx clearance asap, and Phil explained that if pt's son calls today they can try to set up an appt this week for pt.  Called son [name] back to make aware, provided [place] number. He will call to schedule an appt.  Will keep message open to follow up on and ensure that an appt is scheduled with heartcare.

## 2020-12-30 NOTE — Telephone Encounter (Incomplete)
   [facility] Pre-operative Risk Assessment    Patient Name: Marina R Roussell  DOB: 10/06/1940  MRN: [id number]   HEARTCARE STAFF: - Please ensure there is not already an duplicate clearance open for this procedure. - Under Visit Info/Reason for Call, type in Other and utilize the format Clearance MM/DD/YY or Clearance TBD. Do not use dashes or single digits. - If request is for dental extraction, please clarify the # of teeth to be extracted.  Request for surgical clearance:  1. What type of surgery is being performed? pulmonoscopy  2. When is this surgery scheduled? 01/05/21   3. What type of clearance is required (medical clearance vs. Pharmacy clearance to hold med vs. Both)? Both   4. Are there any medications that need to be held prior to surgery and how long? Depends on Pre-Op Team  5. Practice name and name of physician performing surgery? [facility]; Dr. Bynum  6. What is the office phone number? [phone]   7.   What is the office fax number? ***  8.   Anesthesia type (None, local, MAC, general) ? ***   Phillip Pough 12/30/2020, 4:55 PM  _________________________________________________________________   (provider comments below)   

## 2020-12-30 NOTE — Telephone Encounter (Incomplete)
   [facility] Pre-operative Risk Assessment    Patient Name: KEYRY IRACHETA  DOB: 05-03-1941  MRN: [id number]   HEARTCARE STAFF: - Please ensure there is not already an duplicate clearance open for this procedure. - Under Visit Info/Reason for Call, type in Other and utilize the format Clearance MM/DD/YY or Clearance TBD. Do not use dashes or single digits. - If request is for dental extraction, please clarify the # of teeth to be extracted.  Request for surgical clearance:  1. What type of surgery is being performed? pulmonoscopy  2. When is this surgery scheduled? 01/05/21   3. What type of clearance is required (medical clearance vs. Pharmacy clearance to hold med vs. Both)? Both   4. Are there any medications that need to be held prior to surgery and how long? Depends on Pre-Op Team  5. Practice name and name of physician performing surgery? [facility]; Dr. Malvin Johns  6. What is the office phone number? [phone]   7.   What is the office fax number? ***  8.   Anesthesia type (None, local, MAC, general) ? Doren Custard Pough 12/30/2020, 4:55 PM  _________________________________________________________________   (provider comments below)

## 2020-12-31 ENCOUNTER — Other Ambulatory Visit: Payer: Self-pay

## 2020-12-31 DIAGNOSIS — F32A Depression, unspecified: Secondary | ICD-10-CM | POA: Insufficient documentation

## 2020-12-31 DIAGNOSIS — I10 Essential (primary) hypertension: Secondary | ICD-10-CM | POA: Insufficient documentation

## 2020-12-31 DIAGNOSIS — Z9981 Dependence on supplemental oxygen: Secondary | ICD-10-CM | POA: Insufficient documentation

## 2020-12-31 DIAGNOSIS — Z972 Presence of dental prosthetic device (complete) (partial): Secondary | ICD-10-CM | POA: Insufficient documentation

## 2020-12-31 DIAGNOSIS — S62109A Fracture of unspecified carpal bone, unspecified wrist, initial encounter for closed fracture: Secondary | ICD-10-CM | POA: Insufficient documentation

## 2020-12-31 DIAGNOSIS — E119 Type 2 diabetes mellitus without complications: Secondary | ICD-10-CM | POA: Insufficient documentation

## 2020-12-31 DIAGNOSIS — F419 Anxiety disorder, unspecified: Secondary | ICD-10-CM | POA: Insufficient documentation

## 2020-12-31 DIAGNOSIS — K08109 Complete loss of teeth, unspecified cause, unspecified class: Secondary | ICD-10-CM | POA: Insufficient documentation

## 2020-12-31 DIAGNOSIS — E7800 Pure hypercholesterolemia, unspecified: Secondary | ICD-10-CM | POA: Insufficient documentation

## 2020-12-31 DIAGNOSIS — J189 Pneumonia, unspecified organism: Secondary | ICD-10-CM | POA: Insufficient documentation

## 2021-01-01 ENCOUNTER — Encounter (HOSPITAL_BASED_OUTPATIENT_CLINIC_OR_DEPARTMENT_OTHER): Payer: Self-pay | Admitting: *Deleted

## 2021-01-01 ENCOUNTER — Other Ambulatory Visit: Payer: Self-pay

## 2021-01-01 ENCOUNTER — Encounter: Payer: Self-pay | Admitting: Cardiology

## 2021-01-01 ENCOUNTER — Telehealth: Payer: Self-pay | Admitting: Emergency Medicine

## 2021-01-01 ENCOUNTER — Emergency Department (HOSPITAL_BASED_OUTPATIENT_CLINIC_OR_DEPARTMENT_OTHER): Payer: Medicare Other

## 2021-01-01 ENCOUNTER — Ambulatory Visit: Payer: Medicare Other | Admitting: Cardiology

## 2021-01-01 ENCOUNTER — Other Ambulatory Visit: Payer: Medicare Other

## 2021-01-01 ENCOUNTER — Emergency Department (HOSPITAL_BASED_OUTPATIENT_CLINIC_OR_DEPARTMENT_OTHER)
Admission: EM | Admit: 2021-01-01 | Discharge: 2021-01-01 | Disposition: A | Discharge status: Home / Self Care | Payer: Medicare Other | Attending: Emergency Medicine | Admitting: Emergency Medicine

## 2021-01-01 VITALS — BP 100/50 | HR 85 | Ht 64.0 in | Wt 115.1 lb

## 2021-01-01 DIAGNOSIS — J441 Chronic obstructive pulmonary disease with (acute) exacerbation: Secondary | ICD-10-CM | POA: Diagnosis not present

## 2021-01-01 DIAGNOSIS — Z20822 Contact with and (suspected) exposure to covid-19: Secondary | ICD-10-CM | POA: Diagnosis not present

## 2021-01-01 DIAGNOSIS — R079 Chest pain, unspecified: Secondary | ICD-10-CM

## 2021-01-01 DIAGNOSIS — R0789 Other chest pain: Secondary | ICD-10-CM | POA: Diagnosis not present

## 2021-01-01 DIAGNOSIS — R0602 Shortness of breath: Secondary | ICD-10-CM | POA: Diagnosis not present

## 2021-01-01 DIAGNOSIS — Z7984 Long term (current) use of oral hypoglycemic drugs: Secondary | ICD-10-CM | POA: Diagnosis not present

## 2021-01-01 DIAGNOSIS — E1159 Type 2 diabetes mellitus with other circulatory complications: Secondary | ICD-10-CM

## 2021-01-01 DIAGNOSIS — I152 Hypertension secondary to endocrine disorders: Secondary | ICD-10-CM | POA: Diagnosis not present

## 2021-01-01 DIAGNOSIS — Z853 Personal history of malignant neoplasm of breast: Secondary | ICD-10-CM | POA: Diagnosis not present

## 2021-01-01 DIAGNOSIS — Z923 Personal history of irradiation: Secondary | ICD-10-CM | POA: Diagnosis not present

## 2021-01-01 DIAGNOSIS — Z7951 Long term (current) use of inhaled steroids: Secondary | ICD-10-CM | POA: Insufficient documentation

## 2021-01-01 DIAGNOSIS — Z720 Tobacco use: Secondary | ICD-10-CM

## 2021-01-01 DIAGNOSIS — Z7982 Long term (current) use of aspirin: Secondary | ICD-10-CM | POA: Diagnosis not present

## 2021-01-01 DIAGNOSIS — E1169 Type 2 diabetes mellitus with other specified complication: Secondary | ICD-10-CM | POA: Diagnosis not present

## 2021-01-01 DIAGNOSIS — E782 Mixed hyperlipidemia: Secondary | ICD-10-CM | POA: Diagnosis not present

## 2021-01-01 DIAGNOSIS — E860 Dehydration: Secondary | ICD-10-CM | POA: Insufficient documentation

## 2021-01-01 DIAGNOSIS — E1165 Type 2 diabetes mellitus with hyperglycemia: Secondary | ICD-10-CM | POA: Insufficient documentation

## 2021-01-01 DIAGNOSIS — R0902 Hypoxemia: Secondary | ICD-10-CM | POA: Diagnosis not present

## 2021-01-01 DIAGNOSIS — I340 Nonrheumatic mitral (valve) insufficiency: Secondary | ICD-10-CM | POA: Insufficient documentation

## 2021-01-01 DIAGNOSIS — J449 Chronic obstructive pulmonary disease, unspecified: Secondary | ICD-10-CM | POA: Diagnosis not present

## 2021-01-01 DIAGNOSIS — E785 Hyperlipidemia, unspecified: Secondary | ICD-10-CM | POA: Diagnosis not present

## 2021-01-01 DIAGNOSIS — R739 Hyperglycemia, unspecified: Secondary | ICD-10-CM

## 2021-01-01 LAB — BASIC METABOLIC PANEL
Anion gap: 12 (ref 5–15)
BUN: 42 mg/dL — ABNORMAL HIGH (ref 8–23)
CO2: 22 mmol/L (ref 22–32)
Calcium: 9.5 mg/dL (ref 8.9–10.3)
Chloride: 89 mmol/L — ABNORMAL LOW (ref 98–111)
Creatinine, Ser: 2 mg/dL — ABNORMAL HIGH (ref 0.44–1.00)
GFR, Estimated: 25 mL/min — ABNORMAL LOW (ref >60)
Glucose, Bld: 561 mg/dL (ref 70–99)
Potassium: 4.7 mmol/L (ref 3.5–5.1)
Sodium: 123 mmol/L — ABNORMAL LOW (ref 135–145)

## 2021-01-01 LAB — CBC WITH DIFFERENTIAL/PLATELET
Abs Immature Granulocytes: 0.11 10*3/uL — ABNORMAL HIGH (ref 0.00–0.07)
Basophils Absolute: 0 10*3/uL (ref 0.0–0.1)
Basophils Relative: 0 %
Eosinophils Absolute: 0 10*3/uL (ref 0.0–0.5)
Eosinophils Relative: 0 %
HCT: 40.7 % (ref 36.0–46.0)
Hemoglobin: 13.2 g/dL (ref 12.0–15.0)
Immature Granulocytes: 1 %
Lymphocytes Relative: 4 %
Lymphs Abs: 0.5 10*3/uL — ABNORMAL LOW (ref 0.7–4.0)
MCH: 26.8 pg (ref 26.0–34.0)
MCHC: 32.4 g/dL (ref 30.0–36.0)
MCV: 82.6 fL (ref 80.0–100.0)
Monocytes Absolute: 0.1 10*3/uL (ref 0.1–1.0)
Monocytes Relative: 1 %
Neutro Abs: 11.1 10*3/uL — ABNORMAL HIGH (ref 1.7–7.7)
Neutrophils Relative %: 94 %
Platelets: 453 10*3/uL — ABNORMAL HIGH (ref 150–400)
RBC: 4.93 MIL/uL (ref 3.87–5.11)
RDW: 22.3 % — ABNORMAL HIGH (ref 11.5–15.5)
WBC: 11.8 10*3/uL — ABNORMAL HIGH (ref 4.0–10.5)
nRBC: 0 % (ref 0.0–0.2)

## 2021-01-01 LAB — RESP PANEL BY RT-PCR (FLU A&B, COVID) ARPGX2
Influenza A by PCR: NEGATIVE
Influenza B by PCR: NEGATIVE
SARS Coronavirus 2 by RT PCR: NEGATIVE

## 2021-01-01 LAB — TROPONIN I (HIGH SENSITIVITY)
Troponin I (High Sensitivity): 10 ng/L (ref <18)
Troponin I (High Sensitivity): 9 ng/L (ref <18)

## 2021-01-01 LAB — CBG MONITORING, ED
Glucose-Capillary: 443 mg/dL — ABNORMAL HIGH (ref 70–99)
Glucose-Capillary: 400 mg/dL — ABNORMAL HIGH (ref 70–99)

## 2021-01-01 MED ORDER — SODIUM CHLORIDE 0.9 % IV BOLUS
500.0000 mL | Freq: Once | INTRAVENOUS | Status: AC
  Administered 2021-01-01: 500 mL via INTRAVENOUS

## 2021-01-01 MED ORDER — ALBUTEROL SULFATE HFA 108 (90 BASE) MCG/ACT IN AERS
2.0000 | INHALATION_SPRAY | Freq: Once | RESPIRATORY_TRACT | Status: AC
  Administered 2021-01-01: 2 via RESPIRATORY_TRACT
  Filled 2021-01-01: qty 6.7

## 2021-01-01 MED ORDER — INSULIN ASPART 100 UNIT/ML ~~LOC~~ SOLN
6.0000 [IU] | Freq: Once | SUBCUTANEOUS | Status: AC
  Administered 2021-01-01: 6 [IU] via SUBCUTANEOUS

## 2021-01-01 MED ORDER — SODIUM CHLORIDE 0.9 % IV BOLUS
1000.0000 mL | Freq: Once | INTRAVENOUS | Status: AC
  Administered 2021-01-01: 1000 mL via INTRAVENOUS

## 2021-01-01 NOTE — ED Notes (Signed)
Changed to room air, SpO2 97% denies difficulty breathing.

## 2021-01-01 NOTE — Discharge Instructions (Signed)
You were seen in the emergency department for chest pain and shortness of breath and was found to have elevated blood sugars and signs of dehydration.  He was given fluids and insulin with improvement in your symptoms.  Follow-up with your providers.  Return to the emergency department for any worsening or concerning symptoms

## 2021-01-01 NOTE — ED Triage Notes (Signed)
Sent here Consult office today , for eval of 02  sat 90 %  HX COPD

## 2021-01-01 NOTE — ED Notes (Signed)
Resting quietly with eyes closed, opens eyes to light verbal stimuli and spont. Voices no complaints at this time, IVF bolus continues

## 2021-01-01 NOTE — Telephone Encounter (Signed)
Called and spoke with patient's son, Dawn [name], per [place].  He wanted to know if the super D Ct needed to be done prior to her bronch on Monday 4/25.  I looked back in the notes and it appears that Dr. Lamonte Sakai wanted the super D done prior to the bronch, I advised the son of this.  The scan was cancelled because the patient was called and she told the person that it was not a good time for her when they called and it was cancelled.  I let him know I would contact the [facility] that scheduled it to see what we can do to get it done prior to her bronch on Monday 01/05/21.  Please call the patient's son, Dawn [name], at [phone] and not the patient.  Looks like Dawn [name] scheduled this super D.  Can someone reschedule this please?

## 2021-01-01 NOTE — Telephone Encounter (Signed)
Per pt's chart, pt has an appt with cardiology today 01/01/21 at 3:20pm. Will keep message open to assure appt is kept.

## 2021-01-01 NOTE — Telephone Encounter (Signed)
Pt was scheduled for today at 1:00 for Ct & she cancelled.  I have her rescheduled to tomorrow at 3:15 - check-in 2:45 per Nunzio Cobbs to be worked in.  She will have courier to send disk stat to [facility] for procedure on Monday.  Called pt's son Dawn [name] and gave him appt info.  He states ok.  Nothing further needed.

## 2021-01-01 NOTE — ED Provider Notes (Signed)
[facility] Provider Note   CSN: [id number] Arrival date & time: 01/01/21  1542     History Chief Complaint  Patient presents with  . Shortness of Breath    Dawn [name] is a 80 y.o. female.  Is been in and out of the hospital this past month for COPD chest pain and anemia.  She has a tumor that she is post to get a bronchoscopy for next week.  She was at an outpatient clinic visit today and noted to be hypoxic.  She denies shortness of breath.  She does endorse some right-sided chest pain which seems like she has had before.  No fever.  Son states continues to chain smoke.  The history is provided by the patient and a relative.  Shortness of Breath Severity:  Unable to specify Onset quality:  Unable to specify Timing:  Intermittent Progression:  Unchanged Chronicity:  Chronic Context: activity   Relieved by:  None tried Worsened by:  Activity Ineffective treatments:  Inhaler and rest Associated symptoms: chest pain and cough   Associated symptoms: no abdominal pain, no fever, no headaches, no neck pain, no rash, no sore throat and no vomiting   Risk factors: tobacco use        Past Medical History:  Diagnosis Date  . Anxiety   . Breast cancer of upper-outer quadrant of left female breast ([place]) 08/18/2015  . Broken wrist    when younger x2  . COPD (chronic obstructive pulmonary disease) [facility])    Hospitalization March 2011  . Depression   . Diabetes mellitus without complication ([place])   . Full dentures   . H/O vaginal delivery 1962   x1   . High cholesterol   . History of home oxygen therapy   . Hypertension   . Pneumonia   . Radiation    11/18/15- 12/16/15 to her Left Breast    Patient Active Problem List   Diagnosis Date Noted  . Pneumonia   . Hypertension   . History of home oxygen therapy   . High cholesterol   . Full dentures   . Diabetes mellitus without complication ([place])   . Depression   . Broken wrist   . Anxiety   .  Generalized weakness 12/12/2020  . Cigar smoker 12/12/2020  . Aortic atherosclerosis ([place]) 12/12/2020  . Hypertension associated with diabetes ([place]) 12/11/2020  . Hyperlipidemia associated with type 2 diabetes mellitus ([place]) 12/11/2020  . Symptomatic anemia 12/04/2020  . Type 2 diabetes mellitus ([place]) 12/04/2020  . COPD (chronic obstructive pulmonary disease) ([place]) 12/04/2020  . Mass of right lung 12/04/2020  . Tobacco abuse 09/30/2015  . Breast cancer of upper-outer quadrant of left female breast ([place]) 08/18/2015  . Essential hypertension, benign 05/22/2014  . Mixed hyperlipidemia 05/22/2014    Past Surgical History:  Procedure Laterality Date  . ABDOMINAL HYSTERECTOMY    . ABDOMINAL HYSTERECTOMY    . APPENDECTOMY    . CHOLECYSTECTOMY    . EYE SURGERY     cataracts  . RADIOACTIVE SEED GUIDED PARTIAL MASTECTOMY WITH AXILLARY SENTINEL LYMPH NODE BIOPSY Left 09/17/2015   Procedure: RADIOACTIVE SEED LOCALIZATION LEFT BREAST LUMPECTOMY AND LEFT AXILLARY SENTINEL LYMPH NODE BIOPSY;  Surgeon: Excell Seltzer, MD;  Location: [facility];  Service: General;  Laterality: Left;  . TONSILLECTOMY       OB History   No obstetric history on file.     Family History  Problem Relation Age of Onset  . Colon cancer  Sister   . Lung cancer Sister     Social History   Tobacco Use  . Smoking status: Current Every Day Smoker    Packs/day: 1.00    Years: 70.00    Pack years: 70.00    Types: Cigarettes  . Smokeless tobacco: Never Used  . Tobacco comment: less then 1 pack smoked per day 12/17/20 [facility]   Vaping Use  . Vaping Use: Never used  Substance Use Topics  . Alcohol use: Yes    Comment: rare  . Drug use: No    Home Medications Prior to Admission medications   Medication Sig Start Date End Date Taking? Authorizing Provider  albuterol (VENTOLIN HFA) 108 (90 Base) MCG/ACT inhaler Inhale 2 puffs into the lungs every 2 (two) hours as needed for wheezing or shortness of breath. Please  instruct in usage 12/13/20   Samuella Cota, MD  anastrozole (ARIMIDEX) 1 MG tablet Take 1 mg by mouth daily. 12/23/20   [provider]  aspirin EC 81 MG tablet Take 81 mg by mouth daily.    [provider]  Cholecalciferol (VITAMIN D3) 5000 UNITS TABS Take 5,000 Units by mouth daily.    [provider]  metFORMIN (GLUCOPHAGE-XR) 500 MG 24 hr tablet Take 1 tablet (500 mg total) by mouth 2 (two) times daily. 12/06/20 03/06/21  Terrilee Croak, MD  pantoprazole (PROTONIX) 40 MG tablet Take 40 mg by mouth daily. 09/24/20   [provider]  pentoxifylline (TRENTAL) 400 MG CR tablet Take 1 tablet (400 mg total) by mouth 3 (three) times daily with meals. 06/03/20   Nicholas Lose, MD  pioglitazone (ACTOS) 45 MG tablet Take 45 mg by mouth daily. 12/18/20   [provider]  predniSONE (DELTASONE) 10 MG tablet Take 4 tablets (40 mg total) by mouth daily with breakfast for 3 days, THEN 3 tablets (30 mg total) daily with breakfast for 3 days, THEN 2 tablets (20 mg total) daily with breakfast for 3 days, THEN 1 tablet (10 mg total) daily with breakfast for 3 days. 12/25/20 01/06/21  Dorie Rank, MD  quinapril (ACCUPRIL) 40 MG tablet Take 40 mg by mouth daily.     [provider]  rosuvastatin (CRESTOR) 40 MG tablet Take 40 mg by mouth daily.    [provider]  Tiotropium Bromide-Olodaterol (STIOLTO RESPIMAT) 2.5-2.5 MCG/ACT AERS Inhale 2 puffs into the lungs daily. 12/17/20   Collene Gobble, MD  verapamil (CALAN-SR) 240 MG CR tablet Take 240 mg by mouth daily. 06/10/15   [provider]    Allergies    Ferumoxytol, Pneumococcal vaccines, and Repaglinide  Review of Systems   Review of Systems  Constitutional: Negative for fever.  HENT: Negative for sore throat.   Eyes: Negative for visual disturbance.  Respiratory: Positive for cough and shortness of breath.   Cardiovascular: Positive for chest pain. Negative for leg swelling.   Gastrointestinal: Negative for abdominal pain and vomiting.  Genitourinary: Negative for dysuria.  Musculoskeletal: Negative for neck pain.  Skin: Negative for rash.  Neurological: Negative for headaches.    Physical Exam Updated Vital Signs BP (!) 82/44 (BP Location: Left Arm)   Pulse 79   Temp 97.9 F (36.6 C)   Resp 20   Ht 5\' 4"  (1.626 m)   Wt 52.2 kg   SpO2 94%   BMI 19.74 kg/m   Physical Exam Vitals and nursing note reviewed.  Constitutional:      General: She is not in acute distress.  Appearance: She is well-developed.  HENT:     Head: Normocephalic and atraumatic.  Eyes:     Conjunctiva/sclera: Conjunctivae normal.  Cardiovascular:     Rate and Rhythm: Normal rate and regular rhythm.     Heart sounds: No murmur heard.   Pulmonary:     Effort: Pulmonary effort is normal. No respiratory distress.     Breath sounds: Normal breath sounds. No decreased breath sounds.  Abdominal:     Palpations: Abdomen is soft.     Tenderness: There is no abdominal tenderness.  Musculoskeletal:        General: Normal range of motion.     Cervical back: Neck supple.     Right lower leg: No tenderness. No edema.     Left lower leg: No tenderness. No edema.  Skin:    General: Skin is warm and dry.     Capillary Refill: Capillary refill takes less than 2 seconds.  Neurological:     General: No focal deficit present.     Mental Status: She is alert.     ED Results / Procedures / Treatments   Labs (all labs ordered are listed, but only abnormal results are displayed) Labs Reviewed  BASIC METABOLIC PANEL - Abnormal; Notable for the following components:      Result Value   Sodium 123 (*)    Chloride 89 (*)    Glucose, Bld 561 (*)    BUN 42 (*)    Creatinine, Ser 2.00 (*)    GFR, Estimated 25 (*)    All other components within normal limits  CBC WITH DIFFERENTIAL/PLATELET - Abnormal; Notable for the following components:   WBC 11.8 (*)    RDW 22.3 (*)    Platelets  453 (*)    Neutro Abs 11.1 (*)    Lymphs Abs 0.5 (*)    Abs Immature Granulocytes 0.11 (*)    All other components within normal limits  CBG MONITORING, ED - Abnormal; Notable for the following components:   Glucose-Capillary 443 (*)    All other components within normal limits  CBG MONITORING, ED - Abnormal; Notable for the following components:   Glucose-Capillary 400 (*)    All other components within normal limits  RESP PANEL BY RT-PCR (FLU A&B, COVID) ARPGX2  TROPONIN I (HIGH SENSITIVITY)  TROPONIN I (HIGH SENSITIVITY)    EKG EKG Interpretation  Date/Time:  Thursday January 01 2021 16:09:16 EDT Ventricular Rate:  78 PR Interval:  167 QRS Duration: 81 QT Interval:  373 QTC Calculation: 425 R Axis:   56 Text Interpretation: Sinus rhythm Consider anterior infarct No significant change since prior 4/22 Confirmed by Aletta Edouard [phone]) on 01/01/2021 4:28:17 PM   Radiology DG Chest Port 1 View  Result Date: 01/01/2021 CLINICAL DATA:  Chest pain and shortness of breath. EXAM: PORTABLE CHEST 1 VIEW COMPARISON:  12/25/2020. FINDINGS: Stable parenchymal density and adjacent pleural tenting in the right lower lung zone, shown by previous CT to be highly suspicious for malignancy. The remainder of the lungs are clear. Stable borderline enlarged cardiac silhouette. Stable left breast surgical clips. Diffuse osteopenia. IMPRESSION: 1. Stable previously demonstrated changes in the right lung suspicious for malignancy. 2. No acute abnormality. Electronically Signed   By: Claudie Revering M.D.   On: 01/01/2021 17:01    Procedures Procedures   Medications Ordered in ED Medications  sodium chloride 0.9 % bolus 500 mL (0 mLs Intravenous Stopped 01/01/21 1746)  albuterol (VENTOLIN HFA) 108 (90 Base) MCG/ACT inhaler  2 puff (2 puffs Inhalation Given 01/01/21 1639)  sodium chloride 0.9 % bolus 1,000 mL (0 mLs Intravenous Stopped 01/01/21 1915)  insulin aspart (novoLOG) injection 6 Units (6 Units  Subcutaneous Given 01/01/21 1740)    ED Course  I have reviewed the triage vital signs and the nursing notes.  Pertinent labs & imaging results that were available during my care of the patient were reviewed by me and considered in my medical decision making (see chart for details).  Clinical Course as of 01/02/21 1007  Thu Jan 01, 2021  1628 EKG not crossing in epic.  Normal sinus rhythm normal intervals poor R wave progression no acute ST-T changes [MB]  1629 Chemistries abnormal showing a low sodium of 123 that corrects to 130.  Elevated glucose elevated creatinine.  Ordering more IV fluids and some insulin [MB]  1747 Sats ranging between 88 and 93 on room air.  No distress. [MB]  77 Son said that she had been running some low blood sugars so her doctor took away some of her diabetes medication. [MB]    Clinical Course User Index [MB] Hayden Rasmussen, MD   MDM Rules/Calculators/A&P                         Dawn [name] was evaluated in Emergency Department on 01/01/2021 for the symptoms described in the history of present illness. She was evaluated in the context of the global COVID-19 pandemic, which necessitated consideration that the patient might be at risk for infection with the SARS-CoV-2 virus that causes COVID-19. Institutional protocols and algorithms that pertain to the evaluation of patients at risk for COVID-19 are in a state of rapid change based on information released by regulatory bodies including the CDC and federal and state organizations. These policies and algorithms were followed during the patient's care in the ED.  This patient complains of right-sided chest pain hypoxia; this involves an extensive number of treatment Options and is a complaint that carries with it a high risk of complications and Morbidity. The differential includes tumor, COPD, CHF, dehydration, pneumonia, pneumothorax  I ordered, reviewed and interpreted labs, which included CBC with mildly  elevated white count, normal hemoglobin, platelets slightly elevated likely reactive, sodium 123 likely pseudohyponatremia, glucose elevated, creatinine elevated more than baseline, COVID testing negative, troponins flat I ordered medication IV fluids and nebulizer treatment I ordered imaging studies which included chest x-ray and I independently    visualized and interpreted imaging which showed redemonstration of mass right lung Additional history obtained from patient son Previous records obtained and reviewed in epic, patient's recent admissions and clinic visits  After the interventions stated above, I reevaluated the patient and found patient to be asymptomatic.  Oxygenation generally in the low 90s.  Patient is a heavy smoker.  Clinically volume down and given IV fluids.  Blood sugars improved.  Discussed with son is patient's caregiver and he is comfortable plan for discharge and close follow-up with outpatient providers.  Return instructions discussed.   Final Clinical Impression(s) / ED Diagnoses Final diagnoses:  Nonspecific chest pain  Chronic obstructive pulmonary disease, unspecified COPD type ([place])  Hyperglycemia  Dehydration    Rx / DC Orders ED Discharge Orders    None       Hayden Rasmussen, MD 01/02/21 1009

## 2021-01-01 NOTE — Progress Notes (Signed)
Cardiology Office Note:    Date:  01/01/2021   ID:  Dawn [name], DOB [birth year], MRN [id number]  PCP:  Lawerance Cruel, MD  Cardiologist:  No primary care provider on file.  Electrophysiologist:  None   Referring MD: Lawerance Cruel, MD   I am short of breath  History of Present Illness:    Dawn [name] is a 80 y.o. female with a hx of breast cancer, diabetes mellitus, hyperlipidemia, hypertension, lung mass being followed by pulmonary.  The patient presents for an initial visit and presentation is significantly short of breath and hypoxic.  She cannot speak in full sentences but she is short of breath.  The patient needs to be evaluated in the emergency department.  Past Medical History:  Diagnosis Date  . Anxiety   . Breast cancer of upper-outer quadrant of left female breast ([place]) 08/18/2015  . Broken wrist    when younger x2  . COPD (chronic obstructive pulmonary disease) [facility])    Hospitalization March 2011  . Depression   . Diabetes mellitus without complication ([place])   . Full dentures   . H/O vaginal delivery 1962   x1   . High cholesterol   . History of home oxygen therapy   . Hypertension   . Pneumonia   . Radiation    11/18/15- 12/16/15 to her Left Breast    Past Surgical History:  Procedure Laterality Date  . ABDOMINAL HYSTERECTOMY    . ABDOMINAL HYSTERECTOMY    . APPENDECTOMY    . CHOLECYSTECTOMY    . EYE SURGERY     cataracts  . RADIOACTIVE SEED GUIDED PARTIAL MASTECTOMY WITH AXILLARY SENTINEL LYMPH NODE BIOPSY Left 09/17/2015   Procedure: RADIOACTIVE SEED LOCALIZATION LEFT BREAST LUMPECTOMY AND LEFT AXILLARY SENTINEL LYMPH NODE BIOPSY;  Surgeon: Excell Seltzer, MD;  Location: [place];  Service: General;  Laterality: Left;  . TONSILLECTOMY      Current Medications: Current Meds  Medication Sig  . albuterol (VENTOLIN HFA) 108 (90 Base) MCG/ACT inhaler Inhale 2 puffs into the lungs every 2 (two) hours as needed for wheezing or shortness of breath.  Please instruct in usage  . anastrozole (ARIMIDEX) 1 MG tablet Take 1 mg by mouth daily.  Marland Kitchen aspirin EC 81 MG tablet Take 81 mg by mouth daily.  . Cholecalciferol (VITAMIN D3) 5000 UNITS TABS Take 5,000 Units by mouth daily.  . metFORMIN (GLUCOPHAGE-XR) 500 MG 24 hr tablet Take 1 tablet (500 mg total) by mouth 2 (two) times daily.  . pantoprazole (PROTONIX) 40 MG tablet Take 40 mg by mouth daily.  . pentoxifylline (TRENTAL) 400 MG CR tablet Take 1 tablet (400 mg total) by mouth 3 (three) times daily with meals.  . pioglitazone (ACTOS) 45 MG tablet Take 45 mg by mouth daily.  . predniSONE (DELTASONE) 10 MG tablet Take 4 tablets (40 mg total) by mouth daily with breakfast for 3 days, THEN 3 tablets (30 mg total) daily with breakfast for 3 days, THEN 2 tablets (20 mg total) daily with breakfast for 3 days, THEN 1 tablet (10 mg total) daily with breakfast for 3 days.  . quinapril (ACCUPRIL) 40 MG tablet Take 40 mg by mouth daily.   . rosuvastatin (CRESTOR) 40 MG tablet Take 40 mg by mouth daily.  . Tiotropium Bromide-Olodaterol (STIOLTO RESPIMAT) 2.5-2.5 MCG/ACT AERS Inhale 2 puffs into the lungs daily.  . verapamil (CALAN-SR) 240 MG CR tablet Take 240 mg by mouth daily.  . [DISCONTINUED] dextromethorphan-guaiFENesin [facility] DM)  30-600 MG 12hr tablet Take 1 tablet by mouth 2 (two) times daily.  . [DISCONTINUED] Ipratropium-Albuterol (COMBIVENT) 20-100 MCG/ACT AERS respimat Inhale 1 puff into the lungs every 6 (six) hours. Please instruct in usage     Allergies:   Ferumoxytol, Pneumococcal vaccines, and Repaglinide   Social History   Socioeconomic History  . Marital status: Legally Separated    Spouse name: Not on file  . Number of children: Not on file  . Years of education: Not on file  . Highest education level: Not on file  Occupational History  . Not on file  Tobacco Use  . Smoking status: Current Every Day Smoker    Packs/day: 1.00    Years: 70.00    Pack years: 70.00    Types:  Cigarettes  . Smokeless tobacco: Never Used  . Tobacco comment: less then 1 pack smoked per day 12/17/20 [facility]   Vaping Use  . Vaping Use: Never used  Substance and Sexual Activity  . Alcohol use: Yes    Comment: rare  . Drug use: No  . Sexual activity: Not on file  Other Topics Concern  . Not on file  Social History Narrative  . Not on file   Social Determinants of Health   Financial Resource Strain: Not on file  Food Insecurity: Not on file  Transportation Needs: Not on file  Physical Activity: Not on file  Stress: Not on file  Social Connections: Not on file     Family History: The patient's family history includes Colon cancer in her sister; Lung cancer in her sister.  ROS:   Review of Systems  Constitution: Negative for decreased appetite, fever and weight gain.  HENT: Negative for congestion, ear discharge, hoarse voice and sore throat.   Eyes: Negative for discharge, redness, vision loss in right eye and visual halos.  Cardiovascular: Negative for chest pain, dyspnea on exertion, leg swelling, orthopnea and palpitations.  Respiratory: Negative for cough, hemoptysis, shortness of breath and snoring.   Endocrine: Negative for heat intolerance and polyphagia.  Hematologic/Lymphatic: Negative for bleeding problem. Does not bruise/bleed easily.  Skin: Negative for flushing, nail changes, rash and suspicious lesions.  Musculoskeletal: Negative for arthritis, joint pain, muscle cramps, myalgias, neck pain and stiffness.  Gastrointestinal: Negative for abdominal pain, bowel incontinence, diarrhea and excessive appetite.  Genitourinary: Negative for decreased libido, genital sores and incomplete emptying.  Neurological: Negative for brief paralysis, focal weakness, headaches and loss of balance.  Psychiatric/Behavioral: Negative for altered mental status, depression and suicidal ideas.  Allergic/Immunologic: Negative for HIV exposure and persistent infections.     EKGs/Labs/Other Studies Reviewed:    The following studies were reviewed today:   EKG: None today  Transthoracic echocardiogram November 30, 2020 IMPRESSIONS  1. Left ventricular ejection fraction, by estimation, is 60 to 65%. The  left ventricle has normal function. The left ventricle has no regional  wall motion abnormalities. Left ventricular diastolic parameters are  consistent with Grade I diastolic  dysfunction (impaired relaxation). Elevated left atrial pressure.  2. Right ventricular systolic function is normal. The right ventricular  size is normal. Tricuspid regurgitation signal is inadequate for assessing  PA pressure.  3. The mitral valve is normal in structure. No evidence of mitral valve  regurgitation. Mild mitral stenosis. The mean mitral valve gradient is 3.5  mmHg. Moderate mitral annular calcification.  4. The aortic valve is normal in structure. Aortic valve regurgitation is  not visualized. Mild aortic valve sclerosis is present, with no evidence  of aortic valve stenosis.  5. The inferior vena cava is normal in size with greater than 50%  respiratory variability, suggesting right atrial pressure of 3 mmHg.   FINDINGS  Left Ventricle: Left ventricular ejection fraction, by estimation, is 60 to 65%. The left ventricle has normal function. The left ventricle has no regional wall motion abnormalities. Definity contrast agent was given IV to delineate the left ventricular  endocardial borders. The left ventricular internal cavity size was normal  in size. There is no left ventricular hypertrophy. Left ventricular  diastolic parameters are consistent with Grade I diastolic dysfunction  (impaired relaxation). Elevated left  atrial pressure.   Right Ventricle: The right ventricular size is normal. No increase in right ventricular wall thickness. Right ventricular systolic function is normal. Tricuspid regurgitation signal is inadequate for assessing PA  pressure.   Left Atrium: Left atrial size was normal in size.   Right Atrium: Right atrial size was normal in size.   Pericardium: There is no evidence of pericardial effusion.   Mitral Valve: The mitral valve is normal in structure. Moderate mitral annular calcification. No evidence of mitral valve regurgitation. Mild mitral valve stenosis. MV peak gradient, 10.4 mmHg. The mean mitral valve gradient is 3.5 mmHg with average heart rate of 75 bpm.   Tricuspid Valve: The tricuspid valve is normal in structure. Tricuspid  valve regurgitation is not demonstrated. No evidence of tricuspid  stenosis.   Aortic Valve: The aortic valve is normal in structure. Aortic valve  regurgitation is not visualized. Mild aortic valve sclerosis is present,  with no evidence of aortic valve stenosis.   Pulmonic Valve: The pulmonic valve was normal in structure. Pulmonic valve  regurgitation is not visualized. No evidence of pulmonic stenosis.   Aorta: The aortic root is normal in size and structure.   Venous: The inferior vena cava is normal in size with greater than 50%  respiratory variability, suggesting right atrial pressure of 3 mmHg.   IAS/Shunts: No atrial level shunt detected by color flow Doppler.    Recent Labs: 12/05/2020: ALT 9 12/11/2020: Magnesium 1.5 12/25/2020: B Natriuretic Peptide 48.2; BUN 12; Creatinine, Ser 1.28; Potassium 4.0; Sodium 131 01/01/2021: Hemoglobin 13.2; Platelets 453  Recent Lipid Panel    Component Value Date/Time   CHOL  11/25/2009 1100    149        ATP III CLASSIFICATION:  <200     mg/dL   Desirable  200-239  mg/dL   Borderline High  >=240    mg/dL   High          TRIG 72 11/25/2009 1100   HDL 52 11/25/2009 1100   CHOLHDL 2.9 11/25/2009 1100   VLDL 14 11/25/2009 1100   LDLCALC  11/25/2009 1100    83        Total Cholesterol/HDL:CHD Risk Coronary Heart Disease Risk Table                     Men   Women  1/2 Average Risk   3.4   3.3  Average Risk        5.0   4.4  2 X Average Risk   9.6   7.1  3 X Average Risk  23.4   11.0        Use the calculated Patient Ratio above and the CHD Risk Table to determine the patient's CHD Risk.        ATP III CLASSIFICATION (LDL):  <100  mg/dL   Optimal  100-129  mg/dL   Near or Above                    Optimal  130-159  mg/dL   Borderline  160-189  mg/dL   High  >190     mg/dL   Very High    Physical Exam:    VS:  BP (!) 100/50 (BP Location: Right Arm)   Pulse 85   Ht 5\' 4"  (1.626 m)   Wt 115 lb 1.3 oz (52.2 kg)   BMI 19.75 kg/m     Wt Readings from Last 3 Encounters:  01/01/21 115 lb (52.2 kg)  01/01/21 115 lb 1.3 oz (52.2 kg)  12/17/20 122 lb 3.2 oz (55.4 kg)     GEN: Well nourished, well developed in no acute distress HEENT: Normal NECK: No JVD; No carotid bruits LYMPHATICS: No lymphadenopathy CARDIAC: S1S2 noted,RRR, no murmurs, rubs, gallops RESPIRATORY:  Clear to auscultation without rales, wheezing or rhonchi  ABDOMEN: Soft, non-tender, non-distended, +bowel sounds, no guarding. EXTREMITIES: No edema, No cyanosis, no clubbing MUSCULOSKELETAL:  No deformity  SKIN: Warm and dry NEUROLOGIC:  Alert and oriented x 3, non-focal PSYCHIATRIC:  Normal affect, good insight  ASSESSMENT:    1. Hypoxic   2. Shortness of breath   3. Mild mitral regurgitation   4. Hypertension associated with diabetes ([place])   5. Tobacco abuse   6. Mixed hyperlipidemia    PLAN:    The patient is hypoxic with oxygen saturation 87% she is short of breath and appears that she is using accessory muscles.  Try to ask the patient about understanding her symptoms knowing the fact that she does have underlying lung disease.  She tells me that this is different from her baseline.  Therefore we will take the patient to the emergency department to be evaluated acutely.  The patient is in agreement with the above plan. The patient left the office in stable condition.  The patient will follow up  in   Medication Adjustments/Labs and Tests Ordered: Current medicines are reviewed at length with the patient today.  Concerns regarding medicines are outlined above.  No orders of the defined types were placed in this encounter.  No orders of the defined types were placed in this encounter.   There are no Patient Instructions on file for this visit.   Adopting a Healthy Lifestyle.  Know what a healthy weight is for you (roughly BMI <25) and aim to maintain this   Aim for 7+ servings of fruits and vegetables daily   65-80+ fluid ounces of water or unsweet tea for healthy kidneys   Limit to max 1 drink of alcohol per day; avoid smoking/tobacco   Limit animal fats in diet for cholesterol and heart health - choose grass fed whenever available   Avoid highly processed foods, and foods high in saturated/trans fats   Aim for low stress - take time to unwind and care for your mental health   Aim for 150 min of moderate intensity exercise weekly for heart health, and weights twice weekly for bone health   Aim for 7-9 hours of sleep daily   When it comes to diets, agreement about the perfect plan isnt easy to find, even among the experts. Experts at the [employer] developed an idea known as the Healthy Eating Plate. Just imagine a plate divided into logical, healthy portions.   The emphasis is on diet quality:  Load up on vegetables and fruits - one-half of your plate: Aim for color and variety, and remember that potatoes dont count.   Go for whole grains - one-quarter of your plate: Whole wheat, barley, wheat berries, quinoa, oats, brown rice, and foods made with them. If you want pasta, go with whole wheat pasta.   Protein power - one-quarter of your plate: Fish, chicken, beans, and nuts are all healthy, versatile protein sources. Limit red meat.   The diet, however, does go beyond the plate, offering a few other suggestions.   Use healthy plant oils,  such as olive, canola, soy, corn, sunflower and peanut. Check the labels, and avoid partially hydrogenated oil, which have unhealthy trans fats.   If youre thirsty, drink water. Coffee and tea are good in moderation, but skip sugary drinks and limit milk and dairy products to one or two daily servings.   The type of carbohydrate in the diet is more important than the amount. Some sources of carbohydrates, such as vegetables, fruits, whole grains, and beans-are healthier than others.   Finally, stay active  Signed, Berniece Salines, DO  01/01/2021 4:56 PM    [facility]

## 2021-01-02 ENCOUNTER — Telehealth: Payer: Self-pay | Admitting: Emergency Medicine

## 2021-01-02 ENCOUNTER — Other Ambulatory Visit (HOSPITAL_COMMUNITY): Payer: Medicare Other

## 2021-01-02 ENCOUNTER — Ambulatory Visit (INDEPENDENT_AMBULATORY_CARE_PROVIDER_SITE_OTHER)
Admission: RE | Admit: 2021-01-02 | Discharge: 2021-01-02 | Disposition: A | Discharge status: Home / Self Care | Payer: Medicare Other | Source: Ambulatory Visit | Attending: Emergency Medicine | Admitting: Emergency Medicine

## 2021-01-02 ENCOUNTER — Encounter (HOSPITAL_COMMUNITY): Payer: Self-pay | Admitting: Emergency Medicine

## 2021-01-02 ENCOUNTER — Encounter (HOSPITAL_COMMUNITY): Payer: Self-pay | Admitting: Vascular Surgery

## 2021-01-02 DIAGNOSIS — I2510 Atherosclerotic heart disease of native coronary artery without angina pectoris: Secondary | ICD-10-CM | POA: Diagnosis not present

## 2021-01-02 DIAGNOSIS — I700 Atherosclerosis of aorta: Secondary | ICD-10-CM | POA: Diagnosis not present

## 2021-01-02 DIAGNOSIS — R918 Other nonspecific abnormal finding of lung field: Secondary | ICD-10-CM | POA: Diagnosis not present

## 2021-01-02 NOTE — Telephone Encounter (Signed)
Called [phone] and spoke with Ebony Hail with anesthesia.  Advised of recommendations per Dr. Lamonte Sakai.  She stated that we need to call [phone] (OR front desk at [facility]) to cancel procedure.  Advised I would call and call the patient.  As far as rescheduling, patient will have to be seen by cardiology prior to rescheduling the [place].  Called [phone], spoke with [name], listed on the [facility] and advised of recommendations per Dr. Lamonte Sakai.  He verbalized understanding.  She had the super D CT scan done, he will get her rescheduled to see the cardiologist and then call us back after she has been seen so we can advise on next steps.  He verbalized understanding.  Nothing further needed.  Called and spoke with Sharee Pimple at the [place] front desk at [facility] [phone]), advised that this case will be cancelled for Monday and provided reasons.  Nothing further needed.

## 2021-01-02 NOTE — Telephone Encounter (Signed)
ATC son x1 per DPR.  No answer, left detailed message, per DPR.  Routing to Dr. Lamonte Sakai as an Juluis Rainier.  Thank you.

## 2021-01-02 NOTE — Progress Notes (Signed)
EKG: 01/01/21 CXR: 01/01/21 ECHO: 12/05/20 Stress Test: denies Cardiac Cath: denies  Fasting Blood Sugar- 40-200 Checks Blood Sugar__2_ times a day Patient is on Prednisone therapy  ASA: Continue Trental;  Last dose 01/02/21 per MD per son  OSA/CPAP: No Uses home oxygen  Covid test 01/01/21 negative  Anesthesia Review:  Yes, in and out of hospital past month for COPD, CP and anemia.  Hypoxic 01/01/21.  Uses home oxygen.   Patient denies shortness of breath, fever, cough, and chest pain at PAT appointment.  Patient verbalized understanding of instructions provided today at the PAT appointment.  Patient asked to review instructions at home and day of surgery.

## 2021-01-02 NOTE — Telephone Encounter (Signed)
Based on my review of the notes including ED visit, and since she has not had her cardiology eval yet, I think she needs to be postponed. We should postpone the bronch, make sure that she has a new OV set with cardiology to be assessed.

## 2021-01-02 NOTE — Progress Notes (Signed)
   EKG: 01/01/21 CXR: 01/01/21 ECHO: 12/05/20 Stress Test: denies Cardiac Cath: denies  Fasting Blood Sugar- 40-200 Checks Blood Sugar_2__ times a day Patient has been on Prednisone therapy  OSA/CPAP: No Uses home oxygen  ASA: Continue Trental:  Last dose 4/22 per MD  Covid test 01/01/21 negative  Anesthesia Review: Yes, In and out of hospital past month for COPD, CP and anemia.  Uses home 02.  Hypoxic 01/01/21  Patient denies shortness of breath, fever, cough, and chest pain at PAT appointment.  Patient verbalized understanding of instructions provided today at the PAT appointment.  Patient asked to review instructions at home and day of surgery.

## 2021-01-02 NOTE — Progress Notes (Signed)
Anesthesia Chart Review: Dawn [name]      Case: [id number] Date/Time: 01/05/21 0915   Procedure: VIDEO BRONCHOSCOPY WITH ENDOBRONCHIAL NAVIGATION (Right )   Anesthesia type: General   Pre-op diagnosis: LUNG MASS RIGHT MIDDLE LOBE   Location: [facility] / [facility]   Surgeons: Collene Gobble, MD      DISCUSSION: Patient is a 80 year old female scheduled for the above procedure.   History includes smoking (70 pack years), left breast cancer (stage IIA s/p left breast lumpectomy 09/17/15, s/p radiation, anti-estrogen therapy), HTN, DM2, hypercholesterolemia, COPD (home O2), dyspnea.   - [facility] admission 12/04/20-12/06/20 with SOB and symptomatic anemia (HGB 7.6, hemoccult negative, s/p 2 units PRBC, low ferritin 6, but had allergic reaction to IV iron). CT chest showed RML masslike consolidation concerning for malignancy. Bronchoscopy planned for 12/09/19 while admitted but patient adamant about wanting to be discharged on 12/06/20. (She decided not to proceed with bronchoscopy on 12/08/20.). Also A1c 6.5% 12/05/20 with episodes of hypoglycemia, so DM regimen adjusted to glipizide 5 mg daily and metformin 500 mg BID (down from 1000 mg), and Actos stopped.   - [facility] admission 12/11/20-12/14/20 for COPD exacerbation with underlying RML lung mass and ongoing smoking. Improved with steroid taper.   - Office visit with Dr. Lamonte Sakai on 12/17/20. He changed her from Combivent to [facility] following recent COPD exacerbation and continue as needed albuterol. In regards to RML mass, he felt shoe needed bronchoscopy +/- navigation done under general anesthesia. He referred her for preoperative cardiology evaluation.    - ED visit 12/25/20 with SOB. Per EMS, also reported chest pain, but she denied this to ED provider. Wheezing on exam. Serial troponin negative. No concern for ACS. Already undergoing work-up for lung mass. Hyperglycemia (292) and hyponatremia (131) noted. Discharged on prednisone and for  on-going lung mass work-up.   - ED visit 01/01/21 after presenting for cardiology evaluation with Dr. Harriet Masson and noted to be significantly SOB and hypoxic with O2 sat ~ 87%. She was sent to the ED. She described some right sided chest pain. Serial troponin x2 negative. EKG and CXR felt stable. Glucose 561, BUN 42 and Creatinine 2.0 (up from 12/1.28 on 12/25/20), Na 123 (corrected to 130), H/H 13.2/40.7, PLT 453, WBC 11.8. 01/01/21 COVID-19 test negative. She was treated with nebulizer, insulin and IVF. O2 sats improved to the low 90's. Prescribed steroid taper. Son comfortable with plan for discharge home with return precautions discussed.   Called and spoke with nurse Leafy Ro at Dr. Agustina Caroli office. He is out of the office 01/02/21 but will she attempt to contact. In the interim discussed with anesthesiologist Suzette Battiest, MD. If surgery is not emergent, then would recommend postponing surgery since she did not complete cardiology evaluation and based on ED evaluation/labs did not appear optimized for surgery. Another option could be admission prior to surgery for cardiology evaluation and to optimize medical conditions and lab abnormalities as indicated.   Patient has a CT Super D chest scheduled for 01/02/21 at 3:15 PM.  UPDATE 01/02/21 4:39 PM: [name] back from [place] at Dr. Agustina Caroli office. He is planning to postpone surgery and have her re-evaluated by cardiology. Their office to contact patient and the main OR/scheduling.   VS: Wt Readings from Last 3 Encounters:  01/01/21 52.2 kg  01/01/21 52.2 kg  12/17/20 55.4 kg   BP Readings from Last 3 Encounters:  01/01/21 (!) 107/49  01/01/21 (!) 100/50  12/25/20 (!) 117/50  Pulse Readings from Last 3 Encounters:  01/01/21 65  01/01/21 85  12/25/20 (!) 57    PROVIDERS: Lawerance Cruel, MD is PCP  Berniece Salines, DO is cardiologist Nicholas Lose, MD is HEM-ONC Eppie Gibson, MD is RAD-ONC   LABS: See DISCUSSION. Most recent lab results  include: Lab Results  Component Value Date   WBC 11.8 (H) 01/01/2021   HGB 13.2 01/01/2021   HCT 40.7 01/01/2021   PLT 453 (H) 01/01/2021   GLUCOSE 561 (HH) 01/01/2021   ALT 9 12/05/2020   AST 20 12/05/2020   NA 123 (L) 01/01/2021   K 4.7 01/01/2021   CL 89 (L) 01/01/2021   CREATININE 2.00 (H) 01/01/2021   BUN 42 (H) 01/01/2021   CO2 22 01/01/2021   HGBA1C 6.5 (H) 12/05/2020    IMAGES: 1V PCXR 01/01/21: FINDINGS: Stable parenchymal density and adjacent pleural tenting in the right lower lung zone, shown by previous CT to be highly suspicious for malignancy. The remainder of the lungs are clear. Stable borderline enlarged cardiac silhouette. Stable left breast surgical clips. Diffuse osteopenia. IMPRESSION: 1. Stable previously demonstrated changes in the right lung suspicious for malignancy. 2. No acute abnormality.  MRI Brain 12/06/20: IMPRESSION: 1. Negative for metastatic disease or other cause of symptoms. 2. 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 3. Artifacts usually seen with recent iron infusion.  CT Chest 12/04/20: IMPRESSION: 1. No CT evidence of pulmonary artery embolus. 2. Right middle lobe masslike consolidation most concerning for malignancy. Multidisciplinary consult is advised. 3. Aortic Atherosclerosis (ICD10-I70.0) and Emphysema (ICD10-J43.9).   EKG:  EKG 01/01/21: Sinus rhythm Consider anterior infarct Confirmed by Aletta Edouard [phone]) on 01/01/2021 4:28:26 PM - Consider lead reversal V3-4 given reversed r wave progression.  EKG 12/25/20: Sinus rhythm Low voltage, precordial leads Probable anteroseptal infarct, old No significant change since last tracing Confirmed by Dorie Rank [phone]) on 12/25/2020 5:13:04 PM   CV: Echo 12/05/20: IMPRESSIONS  1. Left ventricular ejection fraction, by estimation, is 60 to 65%. The  left ventricle has normal function. The left ventricle has no regional  wall motion abnormalities.  Left ventricular diastolic parameters are  consistent with Grade I diastolic  dysfunction (impaired relaxation). Elevated left atrial pressure.  2. Right ventricular systolic function is normal. The right ventricular  size is normal. Tricuspid regurgitation signal is inadequate for assessing  PA pressure.  3. The mitral valve is normal in structure. No evidence of mitral valve  regurgitation. Mild mitral stenosis. The mean mitral valve gradient is 3.5  mmHg. Moderate mitral annular calcification.  4. The aortic valve is normal in structure. Aortic valve regurgitation is  not visualized. Mild aortic valve sclerosis is present, with no evidence  of aortic valve stenosis.  5. The inferior vena cava is normal in size with greater than 50%  respiratory variability, suggesting right atrial pressure of 3 mmHg.    Past Medical History:  Diagnosis Date  . Anxiety   . Breast cancer of upper-outer quadrant of left female breast ([place]) 08/18/2015  . Broken wrist    when younger x2  . COPD (chronic obstructive pulmonary disease) [facility])    Hospitalization March 2011  . Depression   . Diabetes mellitus without complication ([place])   . Dyspnea   . Full dentures   . H/O vaginal delivery 1962   x1   . High cholesterol   . History of home oxygen therapy   . Hypertension   . Pneumonia   . Radiation  11/18/15- 12/16/15 to her Left Breast    Past Surgical History:  Procedure Laterality Date  . ABDOMINAL HYSTERECTOMY    . ABDOMINAL HYSTERECTOMY    . APPENDECTOMY    . CHOLECYSTECTOMY    . EYE SURGERY     cataracts  . RADIOACTIVE SEED GUIDED PARTIAL MASTECTOMY WITH AXILLARY SENTINEL LYMPH NODE BIOPSY Left 09/17/2015   Procedure: RADIOACTIVE SEED LOCALIZATION LEFT BREAST LUMPECTOMY AND LEFT AXILLARY SENTINEL LYMPH NODE BIOPSY;  Surgeon: Excell Seltzer, MD;  Location: [place];  Service: General;  Laterality: Left;  . TONSILLECTOMY      MEDICATIONS: No current facility-administered medications  for this encounter.   Marland Kitchen albuterol (VENTOLIN HFA) 108 (90 Base) MCG/ACT inhaler  . anastrozole (ARIMIDEX) 1 MG tablet  . aspirin EC 81 MG tablet  . Cholecalciferol (VITAMIN D3) 5000 UNITS TABS  . metFORMIN (GLUCOPHAGE-XR) 500 MG 24 hr tablet  . pantoprazole (PROTONIX) 40 MG tablet  . pentoxifylline (TRENTAL) 400 MG CR tablet  . pioglitazone (ACTOS) 45 MG tablet  . predniSONE (DELTASONE) 10 MG tablet  . quinapril (ACCUPRIL) 40 MG tablet  . rosuvastatin (CRESTOR) 40 MG tablet  . Tiotropium Bromide-Olodaterol (STIOLTO RESPIMAT) 2.5-2.5 MCG/ACT AERS  . verapamil (CALAN-SR) 240 MG CR tablet    Myra Gianotti, PA-C Surgical Short Stay/Anesthesiology [facility] Phone [phone] [facility] Phone [phone] 01/02/2021 4:41 PM

## 2021-01-02 NOTE — Telephone Encounter (Signed)
Spoke with Ebony Hail, [place] for Anesthesia is calling in regards to this patient who is supposed to have a Bronch on Monday with Dr. Lamonte Sakai. She states that patient went to Cariology appointment yesterday and was not able to be seen and was sent from their office to the ED for Hypoxia.  interpreted imaging which showed redemonstration of mass right lung. CBG- 561, Creatinine- 2.00  Ebony Hail was calling with concern of her labs and everything that transpired the other day to make sure that Dr. Lamonte Sakai wished to proceed with Bronch on Monday. Patient was scheduled for Covid test and Super D today 01/02/21 neither has been done  Sending to Dr. Lamonte Sakai

## 2021-01-05 ENCOUNTER — Ambulatory Visit (HOSPITAL_COMMUNITY): Admission: RE | Admit: 2021-01-05 | Payer: Medicare Other | Source: Home / Self Care | Admitting: Emergency Medicine

## 2021-01-05 ENCOUNTER — Telehealth: Payer: Self-pay | Admitting: Emergency Medicine

## 2021-01-05 HISTORY — DX: Dyspnea, unspecified: R06.00

## 2021-01-05 SURGERY — VIDEO BRONCHOSCOPY WITH ENDOBRONCHIAL NAVIGATION
Anesthesia: General | Laterality: Right

## 2021-01-05 NOTE — Telephone Encounter (Signed)
Called and spoke with patient's son, [name], he states he did get her appointment rescheduled with the cardiologist, but could not get her seen until May 18th.  Advised that Dr. Lamonte Sakai wanted [place] to place another consult, so we will see what we can do to get it any sooner.  He verbalized understanding.  PCC's:  Can you get her scheduled any sooner than May 18th?  Thank you.

## 2021-01-05 NOTE — Telephone Encounter (Signed)
We will need to get her re-referred for cards eval.

## 2021-01-05 NOTE — Telephone Encounter (Signed)
Pt son,Dawn [name](ok per dpr) calling because pt cannot get into Dr Harriet Masson until 5/18. Wanting to know if other tests need to be rescheduled. Is there another dr we recommend or can we get her in sooner,etc?How urgent is the bronchoscopy, does it need to be done asap or can it wait? Please adviseTimmothy [name] [phone]

## 2021-01-05 NOTE — Telephone Encounter (Signed)
Spoke with the pt's son, OK per DPR  He states that pt's bronch had to be cancelled today, and wants to know how urgent it is that is be rescheduled  He would like for D Byrum to call him 01/06/21 if possible [phone]

## 2021-01-06 ENCOUNTER — Other Ambulatory Visit (HOSPITAL_COMMUNITY): Payer: Self-pay | Admitting: Family Medicine

## 2021-01-06 ENCOUNTER — Other Ambulatory Visit: Payer: Self-pay | Admitting: Family Medicine

## 2021-01-06 DIAGNOSIS — R9389 Abnormal findings on diagnostic imaging of other specified body structures: Secondary | ICD-10-CM

## 2021-01-06 DIAGNOSIS — E1159 Type 2 diabetes mellitus with other circulatory complications: Secondary | ICD-10-CM | POA: Diagnosis not present

## 2021-01-06 DIAGNOSIS — I519 Heart disease, unspecified: Secondary | ICD-10-CM | POA: Diagnosis not present

## 2021-01-06 DIAGNOSIS — J439 Emphysema, unspecified: Secondary | ICD-10-CM | POA: Diagnosis not present

## 2021-01-06 DIAGNOSIS — I152 Hypertension secondary to endocrine disorders: Secondary | ICD-10-CM | POA: Diagnosis not present

## 2021-01-06 DIAGNOSIS — E1169 Type 2 diabetes mellitus with other specified complication: Secondary | ICD-10-CM | POA: Diagnosis not present

## 2021-01-06 NOTE — Telephone Encounter (Addendum)
Spoke to pt's son and he states doesn't matter which location they have to go to.  Spoke to [place] at [place].  Dr Harriet Masson has opening on 5/8 in [place].  I asked if pt could see PA sooner or another provider for surgical clearance. She states this has to be done by physician & they would have to do a provider change if pt switched.  She scheduled 5/8 appt & placed pt on cancellation list.  She states they frequently have cancellations.  I spoke to son & made him aware & will route to RB to make aware of new appt date.

## 2021-01-06 NOTE — Telephone Encounter (Signed)
Had the chance to speak with [name] this morning.  Reviewed her issues including lung mass, surgical risk.  She has a fairly reassuring echocardiogram with diastolic dysfunction.  Severe COPD with continued smoking.  I think she needs cardiac evaluation before undergoing general anesthesia for bronchoscopy with EBUS (+/- ENB, probably will not require).  We will try to get her cardiology evaluation moved up.  Once obtained we will decide whether general anesthesia is feasible.  If not then we may have to talk about bronchoscopy under conscious sedation or discussion of possible empiric therapy, XRT.

## 2021-01-06 NOTE — Telephone Encounter (Signed)
Thank you very much for working on this.

## 2021-01-09 NOTE — Telephone Encounter (Signed)
I see that the cards appt was moved up to 01/15/21 FYI  I was not sure if msg could be closed now or you wanted to hold. Thanks.

## 2021-01-10 ENCOUNTER — Encounter (HOSPITAL_COMMUNITY): Payer: Self-pay | Admitting: Emergency Medicine

## 2021-01-10 ENCOUNTER — Emergency Department (HOSPITAL_COMMUNITY): Payer: Medicare Other

## 2021-01-10 ENCOUNTER — Observation Stay (HOSPITAL_COMMUNITY): Payer: Medicare Other

## 2021-01-10 ENCOUNTER — Observation Stay (HOSPITAL_COMMUNITY)
Admission: EM | Admit: 2021-01-10 | Discharge: 2021-01-13 | Disposition: A | Discharge status: Home / Self Care | Payer: Medicare Other | Attending: Internal Medicine | Admitting: Internal Medicine

## 2021-01-10 ENCOUNTER — Other Ambulatory Visit: Payer: Self-pay

## 2021-01-10 DIAGNOSIS — Z7984 Long term (current) use of oral hypoglycemic drugs: Secondary | ICD-10-CM | POA: Insufficient documentation

## 2021-01-10 DIAGNOSIS — R6889 Other general symptoms and signs: Secondary | ICD-10-CM | POA: Diagnosis not present

## 2021-01-10 DIAGNOSIS — R627 Adult failure to thrive: Secondary | ICD-10-CM | POA: Insufficient documentation

## 2021-01-10 DIAGNOSIS — Z743 Need for continuous supervision: Secondary | ICD-10-CM | POA: Diagnosis not present

## 2021-01-10 DIAGNOSIS — I152 Hypertension secondary to endocrine disorders: Secondary | ICD-10-CM | POA: Diagnosis present

## 2021-01-10 DIAGNOSIS — Z79899 Other long term (current) drug therapy: Secondary | ICD-10-CM | POA: Diagnosis not present

## 2021-01-10 DIAGNOSIS — E1159 Type 2 diabetes mellitus with other circulatory complications: Secondary | ICD-10-CM | POA: Diagnosis present

## 2021-01-10 DIAGNOSIS — I499 Cardiac arrhythmia, unspecified: Secondary | ICD-10-CM | POA: Diagnosis not present

## 2021-01-10 DIAGNOSIS — Z720 Tobacco use: Secondary | ICD-10-CM | POA: Diagnosis present

## 2021-01-10 DIAGNOSIS — J449 Chronic obstructive pulmonary disease, unspecified: Secondary | ICD-10-CM | POA: Diagnosis not present

## 2021-01-10 DIAGNOSIS — Z7982 Long term (current) use of aspirin: Secondary | ICD-10-CM | POA: Diagnosis not present

## 2021-01-10 DIAGNOSIS — I10 Essential (primary) hypertension: Secondary | ICD-10-CM | POA: Diagnosis not present

## 2021-01-10 DIAGNOSIS — R0602 Shortness of breath: Secondary | ICD-10-CM | POA: Diagnosis not present

## 2021-01-10 DIAGNOSIS — R079 Chest pain, unspecified: Secondary | ICD-10-CM | POA: Diagnosis not present

## 2021-01-10 DIAGNOSIS — N179 Acute kidney failure, unspecified: Principal | ICD-10-CM

## 2021-01-10 DIAGNOSIS — R1084 Generalized abdominal pain: Secondary | ICD-10-CM | POA: Diagnosis not present

## 2021-01-10 DIAGNOSIS — F17210 Nicotine dependence, cigarettes, uncomplicated: Secondary | ICD-10-CM | POA: Insufficient documentation

## 2021-01-10 DIAGNOSIS — Z20822 Contact with and (suspected) exposure to covid-19: Secondary | ICD-10-CM | POA: Diagnosis not present

## 2021-01-10 DIAGNOSIS — E119 Type 2 diabetes mellitus without complications: Secondary | ICD-10-CM

## 2021-01-10 DIAGNOSIS — R918 Other nonspecific abnormal finding of lung field: Secondary | ICD-10-CM | POA: Diagnosis not present

## 2021-01-10 DIAGNOSIS — E876 Hypokalemia: Secondary | ICD-10-CM | POA: Diagnosis present

## 2021-01-10 DIAGNOSIS — Z853 Personal history of malignant neoplasm of breast: Secondary | ICD-10-CM | POA: Insufficient documentation

## 2021-01-10 DIAGNOSIS — E1165 Type 2 diabetes mellitus with hyperglycemia: Secondary | ICD-10-CM | POA: Diagnosis not present

## 2021-01-10 DIAGNOSIS — R109 Unspecified abdominal pain: Secondary | ICD-10-CM

## 2021-01-10 DIAGNOSIS — E1169 Type 2 diabetes mellitus with other specified complication: Secondary | ICD-10-CM | POA: Diagnosis present

## 2021-01-10 LAB — BASIC METABOLIC PANEL
Anion gap: 12 (ref 5–15)
BUN: 15 mg/dL (ref 8–23)
CO2: 26 mmol/L (ref 22–32)
Calcium: 9.1 mg/dL (ref 8.9–10.3)
Chloride: 93 mmol/L — ABNORMAL LOW (ref 98–111)
Creatinine, Ser: 1.51 mg/dL — ABNORMAL HIGH (ref 0.44–1.00)
GFR, Estimated: 35 mL/min — ABNORMAL LOW (ref >60)
Glucose, Bld: 385 mg/dL — ABNORMAL HIGH (ref 70–99)
Potassium: 3 mmol/L — ABNORMAL LOW (ref 3.5–5.1)
Sodium: 131 mmol/L — ABNORMAL LOW (ref 135–145)

## 2021-01-10 LAB — CBC
HCT: 39 % (ref 36.0–46.0)
Hemoglobin: 12.5 g/dL (ref 12.0–15.0)
MCH: 26.6 pg (ref 26.0–34.0)
MCHC: 32.1 g/dL (ref 30.0–36.0)
MCV: 83 fL (ref 80.0–100.0)
Platelets: 326 10*3/uL (ref 150–400)
RBC: 4.7 MIL/uL (ref 3.87–5.11)
RDW: 21.8 % — ABNORMAL HIGH (ref 11.5–15.5)
WBC: 8.9 10*3/uL (ref 4.0–10.5)
nRBC: 0 % (ref 0.0–0.2)

## 2021-01-10 LAB — HEPATIC FUNCTION PANEL
ALT: 9 U/L (ref 0–44)
AST: 12 U/L — ABNORMAL LOW (ref 15–41)
Albumin: 2.7 g/dL — ABNORMAL LOW (ref 3.5–5.0)
Alkaline Phosphatase: 81 U/L (ref 38–126)
Bilirubin, Direct: <0.1 mg/dL (ref 0.0–0.2)
Total Bilirubin: 0.5 mg/dL (ref 0.3–1.2)
Total Protein: 5.8 g/dL — ABNORMAL LOW (ref 6.5–8.1)

## 2021-01-10 LAB — TROPONIN I (HIGH SENSITIVITY)
Troponin I (High Sensitivity): 18 ng/L — ABNORMAL HIGH (ref <18)
Troponin I (High Sensitivity): 19 ng/L — ABNORMAL HIGH (ref <18)

## 2021-01-10 LAB — LIPASE, BLOOD: Lipase: 32 U/L (ref 11–51)

## 2021-01-10 LAB — GLUCOSE, CAPILLARY: Glucose-Capillary: 224 mg/dL — ABNORMAL HIGH (ref 70–99)

## 2021-01-10 MED ORDER — ANASTROZOLE 1 MG PO TABS
1.0000 mg | ORAL_TABLET | Freq: Every day | ORAL | Status: DC
  Administered 2021-01-11 – 2021-01-13 (×3): 1 mg via ORAL
  Filled 2021-01-10 (×3): qty 1

## 2021-01-10 MED ORDER — ARFORMOTEROL TARTRATE 15 MCG/2ML IN NEBU
15.0000 ug | INHALATION_SOLUTION | Freq: Two times a day (BID) | RESPIRATORY_TRACT | Status: DC
  Administered 2021-01-10 – 2021-01-13 (×6): 15 ug via RESPIRATORY_TRACT
  Filled 2021-01-10 (×8): qty 2

## 2021-01-10 MED ORDER — ACETAMINOPHEN 650 MG RE SUPP: 650.0000 mg | Freq: Four times a day (QID) | RECTAL | Status: DC | PRN

## 2021-01-10 MED ORDER — ASPIRIN 81 MG PO CHEW
324.0000 mg | CHEWABLE_TABLET | Freq: Once | ORAL | Status: AC
  Administered 2021-01-10: 324 mg via ORAL
  Filled 2021-01-10: qty 4

## 2021-01-10 MED ORDER — ONDANSETRON HCL 4 MG/2ML IJ SOLN: 4.0000 mg | Freq: Four times a day (QID) | INTRAMUSCULAR | Status: DC | PRN

## 2021-01-10 MED ORDER — IPRATROPIUM-ALBUTEROL 0.5-2.5 (3) MG/3ML IN SOLN: 3.0000 mL | RESPIRATORY_TRACT | Status: DC | PRN

## 2021-01-10 MED ORDER — VERAPAMIL HCL ER 240 MG PO TBCR
240.0000 mg | EXTENDED_RELEASE_TABLET | Freq: Every day | ORAL | Status: DC
  Administered 2021-01-11 – 2021-01-13 (×3): 240 mg via ORAL
  Filled 2021-01-10 (×3): qty 1

## 2021-01-10 MED ORDER — PANTOPRAZOLE SODIUM 40 MG PO TBEC
40.0000 mg | DELAYED_RELEASE_TABLET | Freq: Every day | ORAL | Status: DC
  Administered 2021-01-11 – 2021-01-13 (×3): 40 mg via ORAL
  Filled 2021-01-10 (×3): qty 1

## 2021-01-10 MED ORDER — SENNOSIDES-DOCUSATE SODIUM 8.6-50 MG PO TABS: 1.0000 | ORAL_TABLET | Freq: Every evening | ORAL | Status: DC | PRN

## 2021-01-10 MED ORDER — ACETAMINOPHEN 325 MG PO TABS: 650.0000 mg | ORAL_TABLET | Freq: Four times a day (QID) | ORAL | Status: DC | PRN

## 2021-01-10 MED ORDER — UMECLIDINIUM BROMIDE 62.5 MCG/INH IN AEPB
1.0000 | INHALATION_SPRAY | Freq: Every day | RESPIRATORY_TRACT | Status: DC
  Administered 2021-01-11 – 2021-01-13 (×2): 1 via RESPIRATORY_TRACT
  Filled 2021-01-10: qty 7

## 2021-01-10 MED ORDER — POTASSIUM CHLORIDE IN NACL 40-0.9 MEQ/L-% IV SOLN
INTRAVENOUS | Status: DC
  Filled 2021-01-10 (×2): qty 1000

## 2021-01-10 MED ORDER — POTASSIUM CHLORIDE CRYS ER 20 MEQ PO TBCR
20.0000 meq | EXTENDED_RELEASE_TABLET | Freq: Once | ORAL | Status: AC
  Administered 2021-01-10: 20 meq via ORAL
  Filled 2021-01-10: qty 1

## 2021-01-10 MED ORDER — ROSUVASTATIN CALCIUM 20 MG PO TABS
40.0000 mg | ORAL_TABLET | Freq: Every day | ORAL | Status: DC
  Administered 2021-01-11 – 2021-01-13 (×3): 40 mg via ORAL
  Filled 2021-01-10 (×3): qty 2

## 2021-01-10 MED ORDER — POTASSIUM CHLORIDE 20 MEQ PO PACK
40.0000 meq | PACK | Freq: Once | ORAL | Status: AC
  Administered 2021-01-11: 40 meq via ORAL
  Filled 2021-01-10: qty 2

## 2021-01-10 MED ORDER — ASPIRIN EC 81 MG PO TBEC
81.0000 mg | DELAYED_RELEASE_TABLET | Freq: Every day | ORAL | Status: DC
  Administered 2021-01-11 – 2021-01-13 (×3): 81 mg via ORAL
  Filled 2021-01-10 (×3): qty 1

## 2021-01-10 MED ORDER — INSULIN ASPART 100 UNIT/ML IJ SOLN
0.0000 [IU] | Freq: Every day | INTRAMUSCULAR | Status: DC
  Administered 2021-01-10: 2 [IU] via SUBCUTANEOUS
  Administered 2021-01-12: 3 [IU] via SUBCUTANEOUS

## 2021-01-10 MED ORDER — ALBUTEROL SULFATE HFA 108 (90 BASE) MCG/ACT IN AERS: 2.0000 | INHALATION_SPRAY | RESPIRATORY_TRACT | Status: DC | PRN

## 2021-01-10 MED ORDER — HEPARIN SODIUM (PORCINE) 5000 UNIT/ML IJ SOLN
5000.0000 [IU] | Freq: Three times a day (TID) | INTRAMUSCULAR | Status: DC
  Administered 2021-01-10 – 2021-01-13 (×9): 5000 [IU] via SUBCUTANEOUS
  Filled 2021-01-10 (×8): qty 1

## 2021-01-10 MED ORDER — PENTOXIFYLLINE ER 400 MG PO TBCR
400.0000 mg | EXTENDED_RELEASE_TABLET | Freq: Three times a day (TID) | ORAL | Status: DC
  Administered 2021-01-11 – 2021-01-13 (×9): 400 mg via ORAL
  Filled 2021-01-10 (×10): qty 1

## 2021-01-10 MED ORDER — LACTATED RINGERS IV BOLUS
1000.0000 mL | Freq: Once | INTRAVENOUS | Status: AC
  Administered 2021-01-10: 1000 mL via INTRAVENOUS

## 2021-01-10 MED ORDER — ONDANSETRON HCL 4 MG PO TABS: 4.0000 mg | ORAL_TABLET | Freq: Four times a day (QID) | ORAL | Status: DC | PRN

## 2021-01-10 MED ORDER — INSULIN ASPART 100 UNIT/ML IJ SOLN
0.0000 [IU] | Freq: Three times a day (TID) | INTRAMUSCULAR | Status: DC
  Administered 2021-01-11: 5 [IU] via SUBCUTANEOUS
  Administered 2021-01-11 (×2): 3 [IU] via SUBCUTANEOUS
  Administered 2021-01-12 (×2): 8 [IU] via SUBCUTANEOUS
  Administered 2021-01-13: 5 [IU] via SUBCUTANEOUS
  Administered 2021-01-13: 11 [IU] via SUBCUTANEOUS
  Administered 2021-01-13: 5 [IU] via SUBCUTANEOUS

## 2021-01-10 NOTE — ED Triage Notes (Signed)
Pt to triage via [facility] from home.  Reports chest pressure since 14:15 today.  States she was unable to chew ASA and refused NTG per EMS.  Told EMS pain resolved prior to arrival.  Now reports pain 5/10 with SOB.  20g LAC.

## 2021-01-10 NOTE — ED Provider Notes (Addendum)
I saw and evaluated the patient, reviewed the resident's note and I agree with the findings and plan.    Patient has a spiculated right middle lobe lung mass consistent with bronchogenic carcinoma identified on CT 4\24\2022.  Patient was supposed to get follow-up bronchoscopy but did not go.  He has been living home independently with her son helping bringing in food and checking in frequently.  He reports that she does not seem to be eating much.  He brings food but has not eaten.  He has tried bringing in [employer] which also is unclear how much she is taking.  Patient came to the emergency department tonight with complaints of chest pain.  She reports central chest pain.  Her son reports this is a frequent occurrence.  She also reports she has become quite weak and is having difficulty taking care of herself at home.  Patient is alert. No Respiratory distress at rest.  Lungs are grossly clear.  Heart is regular.  Abdomen soft and nontender.  Calf soft and nontender.  Patient does have some asymmetric edema of the left foot.  Foot is warm and dry. Nontender.  I agree with plan of management.  At this time patient does have hypokalemia and increasing AKI over the past 2 weeks.   Generalized weakness likely due to poor oral intake with dehydration, hypokalemia and failure to thrive in the setting of newly diagnosed lung cancer.  Plan for admission for rehydration, potassium replacement and therapeutic planning for newly diagnosed lung mass, suspected bronchogenic carcinoma.  Assessment for safety to return home versus rehab therapy post inpatient treatment   Charlesetta Shanks, MD 01/10/21 2117    Charlesetta Shanks, MD 01/13/21 1104

## 2021-01-10 NOTE — H&P (Addendum)
History and Physical    Dawn [name] LKJ:[id number] DOB: Dec 16, 1940 DOA: 01/10/2021  PCP: Lawerance Cruel, MD  Patient coming from: Home via EMS  I have personally briefly reviewed patient's old medical records in [place]  Chief Complaint: Generalized weakness, chest pressure  HPI: Dawn [name] is a 80 y.o. female with medical history significant for COPD, T2DM, HTN, HLD, breast cancer, ongoing tobacco use, and known right middle lobe lung mass concerning for primary bronchogenic carcinoma who presents to the ED for evaluation of generalized weakness, poor oral intake, and chest pressure.  History is supplemented by patient's son at bedside.  Patient states she has been having intermittent right lower chest discomfort.  Pain is nonradiating and more noticeable with exertion.  This pain has been ongoing since she was found to have a right middle lobe lung mass.  She is continuing to smoke about 2 packs/day.  She has chronic severe COPD but currently feels that her breathing is stable.  Per son, she has been eating less and has been becoming generally weak.  She lives alone and it seems she is not adequately caring for self.  Her son is helping care for her.  She denies any abdominal pain but does exhibit tenderness to palpation over her upper abdomen.  She denies any subjective fevers, chills, diaphoresis, cough, nausea, vomiting, dysuria, diarrhea.  ED Course:  Initial vitals showed BP 111/64, pulse 95, RR 22, temp 97.6 F, SPO2 94% on room air.  Labs show creatinine 1.51 (baseline <1.0), BUN 15, sodium 131 (138 when corrected for hyperglycemia), potassium 3.0, bicarb 26, serum glucose 385, total protein 5.8, albumin 2.7, AST 12, ALT 9, alk phos 81, total bilirubin 0.5, lipase 32, WBC 8.9, hemoglobin 12.5, platelets 326,000.  High-sensitivity troponin I 18 and 19 on repeat.  2 view chest x-ray shows stable right middle lobe mass unchanged compared to previous CT with otherwise no  acute intrathoracic process.  Patient was given 1 L LR, oral K 20 mEq, and aspirin 324 mg.  The hospitalist service was consulted to admit for further evaluation and management.  Review of Systems: All systems reviewed and are negative except as documented in history of present illness above.   Past Medical History:  Diagnosis Date  . Anxiety   . Breast cancer of upper-outer quadrant of left female breast ([place]) 08/18/2015  . Broken wrist    when younger x2  . COPD (chronic obstructive pulmonary disease) [facility])    Hospitalization March 2011  . Depression   . Diabetes mellitus without complication ([place])   . Dyspnea   . Full dentures   . H/O vaginal delivery 1962   x1   . High cholesterol   . History of home oxygen therapy   . Hypertension   . Pneumonia   . Radiation    11/18/15- 12/16/15 to her Left Breast    Past Surgical History:  Procedure Laterality Date  . ABDOMINAL HYSTERECTOMY    . ABDOMINAL HYSTERECTOMY    . APPENDECTOMY    . CHOLECYSTECTOMY    . EYE SURGERY     cataracts  . RADIOACTIVE SEED GUIDED PARTIAL MASTECTOMY WITH AXILLARY SENTINEL LYMPH NODE BIOPSY Left 09/17/2015   Procedure: RADIOACTIVE SEED LOCALIZATION LEFT BREAST LUMPECTOMY AND LEFT AXILLARY SENTINEL LYMPH NODE BIOPSY;  Surgeon: Excell Seltzer, MD;  Location: [place];  Service: General;  Laterality: Left;  . TONSILLECTOMY      Social History:  reports that she has been smoking cigarettes.  She has a 70.00 pack-year smoking history. She has never used smokeless tobacco. She reports current alcohol use. She reports that she does not use drugs.  Allergies  Allergen Reactions  . Ferumoxytol Shortness Of Breath, Itching and Other (See Comments)    Pt reported lightheadedness, a flushed feeling, SOB, and itching  . Pneumococcal Vaccines Hives, Swelling and Other (See Comments)    Arm became swollen  . Repaglinide Other (See Comments)    Dropped BGL too low    Family History  Problem Relation Age of Onset   . Colon cancer Sister   . Lung cancer Sister      Prior to Admission medications   Medication Sig Start Date End Date Taking? Authorizing Provider  albuterol (VENTOLIN HFA) 108 (90 Base) MCG/ACT inhaler Inhale 2 puffs into the lungs every 2 (two) hours as needed for wheezing or shortness of breath. Please instruct in usage Patient taking differently: Inhale 2 puffs into the lungs every 2 (two) hours as needed for wheezing or shortness of breath. 12/13/20  Yes Samuella Cota, MD  anastrozole (ARIMIDEX) 1 MG tablet Take 1 mg by mouth daily. 12/23/20  Yes [provider]  aspirin EC 81 MG tablet Take 81 mg by mouth daily.   Yes [provider]  Cholecalciferol (VITAMIN D3) 5000 UNITS TABS Take 5,000 Units by mouth daily.   Yes [provider]  metFORMIN (GLUCOPHAGE-XR) 500 MG 24 hr tablet Take 1 tablet (500 mg total) by mouth 2 (two) times daily. Patient taking differently: Take 100 mg by mouth daily with breakfast. 12/06/20 03/06/21 Yes Dahal, Marlowe Aschoff, MD  pantoprazole (PROTONIX) 40 MG tablet Take 40 mg by mouth daily before breakfast. 09/24/20  Yes [provider]  pentoxifylline (TRENTAL) 400 MG CR tablet Take 1 tablet (400 mg total) by mouth 3 (three) times daily with meals. Patient taking differently: Take 800 mg by mouth in the morning. 06/03/20  Yes Nicholas Lose, MD  pioglitazone (ACTOS) 45 MG tablet Take 45 mg by mouth daily. 12/18/20  Yes [provider]  quinapril (ACCUPRIL) 40 MG tablet Take 40 mg by mouth daily.    Yes [provider]  rosuvastatin (CRESTOR) 40 MG tablet Take 40 mg by mouth daily.   Yes [provider]  verapamil (CALAN-SR) 240 MG CR tablet Take 240 mg by mouth daily. 06/10/15  Yes [provider]  Tiotropium Bromide-Olodaterol (STIOLTO RESPIMAT) 2.5-2.5 MCG/ACT AERS Inhale 2 puffs into the lungs daily. 12/17/20   Collene Gobble, MD    Physical Exam: Vitals:   01/10/21 1700 01/10/21 1902 01/10/21  1915 01/10/21 2121  BP: (!) 104/52 137/87 (!) 103/55 114/70  Pulse: 77 71 77 72  Resp: '18 16 13 20  ' Temp:      TempSrc:      SpO2: 93% 100% 96% 95%   Constitutional: Resting supine in bed, NAD, calm, comfortable Eyes: PERRL, lids and conjunctivae normal ENMT: Mucous membranes are dry. Posterior pharynx clear of any exudate or lesions.Normal dentition.  Neck: normal, supple, no masses. Respiratory: Coarse expiratory wheezing throughout the lung fields.  Normal respiratory effort. No accessory muscle use.  Cardiovascular: Regular rate and rhythm, no murmurs / rubs / gallops. No extremity edema. 2+ pedal pulses. Abdomen: Epigastric and bilateral upper quadrant tenderness to palpation, no masses palpated. Bowel sounds positive.  Musculoskeletal: no clubbing / cyanosis. No joint deformity upper and lower extremities. Good ROM, no contractures. Normal muscle tone.  Skin: no rashes, lesions, ulcers. No induration Neurologic:  CN 2-12 grossly intact. Sensation intact. Strength 5/5 in all 4.  Psychiatric: Alert and oriented x 3. Normal mood.   Labs on Admission: I have personally reviewed following labs and imaging studies  CBC: Recent Labs  Lab 01/10/21 1551  WBC 8.9  HGB 12.5  HCT 39.0  MCV 83.0  PLT 300   Basic Metabolic Panel: Recent Labs  Lab 01/10/21 1551  NA 131*  K 3.0*  CL 93*  CO2 26  GLUCOSE 385*  BUN 15  CREATININE 1.51*  CALCIUM 9.1   GFR: Estimated Creatinine Clearance: 25.3 mL/min (A) (by C-G formula based on SCr of 1.51 mg/dL (H)). Liver Function Tests: Recent Labs  Lab 01/10/21 1551  AST 12*  ALT 9  ALKPHOS 81  BILITOT 0.5  PROT 5.8*  ALBUMIN 2.7*   Recent Labs  Lab 01/10/21 1551  LIPASE 32   No results for input(s): AMMONIA in the last 168 hours. Coagulation Profile: No results for input(s): INR, PROTIME in the last 168 hours. Cardiac Enzymes: No results for input(s): CKTOTAL, CKMB, CKMBINDEX, TROPONINI in the last 168 hours. BNP (last 3  results) No results for input(s): PROBNP in the last 8760 hours. HbA1C: No results for input(s): HGBA1C in the last 72 hours. CBG: No results for input(s): GLUCAP in the last 168 hours. Lipid Profile: No results for input(s): CHOL, HDL, LDLCALC, TRIG, CHOLHDL, LDLDIRECT in the last 72 hours. Thyroid Function Tests: No results for input(s): TSH, T4TOTAL, FREET4, T3FREE, THYROIDAB in the last 72 hours. Anemia Panel: No results for input(s): VITAMINB12, FOLATE, FERRITIN, TIBC, IRON, RETICCTPCT in the last 72 hours. Urine analysis:    Component Value Date/Time   COLORURINE YELLOW 11/25/2009 1109   APPEARANCEUR CLEAR 11/25/2009 1109   LABSPEC 1.015 11/25/2009 1109   PHURINE 6.0 11/25/2009 1109   GLUCOSEU NEGATIVE 11/25/2009 1109   HGBUR SMALL (A) 11/25/2009 1109   BILIRUBINUR NEGATIVE 11/25/2009 1109   [place] 11/25/2009 1109   PROTEINUR 100 (A) 11/25/2009 1109   UROBILINOGEN 0.2 11/25/2009 1109   NITRITE NEGATIVE 11/25/2009 1109   LEUKOCYTESUR NEGATIVE 11/25/2009 1109    Radiological Exams on Admission: DG Chest 2 View  Result Date: 01/10/2021 CLINICAL DATA:  Chest pain, short of breath, right lung mass EXAM: CHEST - 2 VIEW COMPARISON:  01/02/2021, 01/01/2021 FINDINGS: Frontal and lateral views of the chest demonstrate an unremarkable cardiac silhouette. Stable right middle lobe mass consistent with likely bronchogenic malignancy based on previous CT findings. No new airspace disease, effusion, or pneumothorax. No acute bony abnormalities. IMPRESSION: 1. Stable right middle lobe mass unchanged since previous CT, most consistent with lung cancer. 2. Otherwise no acute intrathoracic process. Electronically Signed   By: Randa Ngo M.D.   On: 01/10/2021 16:00    EKG: Personally reviewed. Normal sinus rhythm, low voltage, no acute ischemic changes.  Assessment/Plan Principal Problem:   Acute kidney injury ([place]) Active Problems:   Tobacco abuse   Type 2 diabetes  mellitus ([facility])   COPD (chronic obstructive pulmonary disease) ([facility])   Mass of right lung   Hypertension associated with diabetes ([place])   Hyperlipidemia associated with type 2 diabetes mellitus ([place])   Hypokalemia   Dawn [name] is a 80 y.o. female with medical history significant for COPD, T2DM, HTN, HLD, breast cancer, ongoing tobacco use, and known right middle lobe lung mass concerning for primary bronchogenic carcinoma who is admitted with AKI and generalized weakness/failure to thrive.  Acute kidney injury: Renal function worsen baseline although improved from most  recent labs on 4/21.  Likely multifactorial from poor oral intake, medication effect. -Check renal ultrasound -Start IV fluid hydration overnight with NS'@100'  mL/hour -Holding home metformin and quinapril -Recheck labs in a.m.  Generalized weakness/deconditioning/failure to thrive: Likely secondary to severe COPD and likely lung cancer. -Continue IV fluid hydration as above -PT/OT eval -Consult to nutrition  Chest discomfort: Right lower sternal chest discomfort likely related to right middle lobe lung mass.  Troponin minimally elevated and flat on repeat.  EKG without acute changes.  Doubt ACS.  Abdominal pain: Tender to palpation in the upper abdominal and epigastric region.  Lipase and LFTs reassuring.  Check KUB.  Hypokalemia: Supplement and check magnesium.  COPD: Chronic severe COPD with ongoing significant tobacco use.  Has chronic wheezing on exam without acute exacerbation. -Continue Brovana/Incruse, duonebs and albuterol as needed -Not currently requiring supplemental O2  Type 2 diabetes with hyperglycemia: Start moderate SSI with HS coverage.  Holding home metformin and Actos for now.  Known right middle lobe lung mass: Presumed primary bronchogenic carcinoma.  Following with pulmonology, Dr. Lamonte Sakai.  Outpatient bronchoscopy has been planned however patient has missed appointments.  She has follow-up  appointments with cardiology and pulmonology scheduled next week.  Hypertension: Continue verapamil.  Holding quinapril in setting of AKI.  Hyperlipidemia: Continue rosuvastatin.  History of left breast cancer s/p lumpectomy and adjuvant radiation therapy 2017: Follows with oncology, Dr. Lindi Adie.  Continue anastrozole and pentoxifylline.  Tobacco use: Still smoking up to 2 packs/day.  Patient strongly advised on smoking cessation although does not seem interested in cutting back.  She has declined nicotine patch.  DVT prophylaxis: Subcutaneous heparin Code Status: Full code, confirmed with patient Family Communication: Discussed with patient's son at bedside Disposition Plan: From home, dispo pending clinical progress and PT/OT eval Consults called: None Level of care: Med-Surg Admission status:  Status is: Observation  The patient remains OBS appropriate and will d/c before 2 midnights.  Dispo: The patient is from: Home              Anticipated d/c is to: Home              Patient currently is not medically stable to d/c.   Difficult to place patient No  Zada Finders MD [facility]  If 7PM-7AM, please contact night-coverage [email or web address]  01/10/2021, 9:51 PM

## 2021-01-10 NOTE — ED Provider Notes (Signed)
[facility] Provider Note   CSN: [id number] Arrival date & time: 01/10/21  1526     History Chief Complaint  Patient presents with  . Chest Pain    Dawn [name] is a 80 y.o. female.  HPI Patient is 80 year old female with a extensive medical history including breast cancer recently diagnosed uncertain etiology, COPD, diabetes, hypertension, hyperlipidemia presenting with a chief complaint of failure to thrive.  Patient has been in and out of the hospital multiple times over the past month due to this new diagnosis of intrathoracic mass.  She has been failing outpatient evaluation due to missed appointments and inability to take care of herself.  She has not been eating or drinking food that the son is bringing to wear and has been running out of her medicines multiple times. Son endorses concerns for failure to thrive at home.  She endorses middle of chest chest pain that has been bothering her persistently for 2 months.  Denies any fevers or chills, nausea or vomiting, syncope or shortness of breath at this time.  Patient does not know when she last ate.  Past Medical History:  Diagnosis Date  . Anxiety   . Breast cancer of upper-outer quadrant of left female breast ([place]) 08/18/2015  . Broken wrist    when younger x2  . COPD (chronic obstructive pulmonary disease) [facility])    Hospitalization March 2011  . Depression   . Diabetes mellitus without complication ([place])   . Dyspnea   . Full dentures   . H/O vaginal delivery 1962   x1   . High cholesterol   . History of home oxygen therapy   . Hypertension   . Pneumonia   . Radiation    11/18/15- 12/16/15 to her Left Breast    Patient Active Problem List   Diagnosis Date Noted  . Acute kidney injury ([place]) 01/10/2021  . Hypoxic 01/01/2021  . Shortness of breath 01/01/2021  . Mild mitral regurgitation 01/01/2021  . Pneumonia   . Hypertension   . History of home oxygen therapy   . High  cholesterol   . Full dentures   . Diabetes mellitus without complication ([place])   . Depression   . Broken wrist   . Anxiety   . Generalized weakness 12/12/2020  . Cigar smoker 12/12/2020  . Aortic atherosclerosis ([place]) 12/12/2020  . Hypertension associated with diabetes ([place]) 12/11/2020  . Hyperlipidemia associated with type 2 diabetes mellitus ([place]) 12/11/2020  . Hypokalemia 12/11/2020  . Symptomatic anemia 12/04/2020  . Type 2 diabetes mellitus ([place]) 12/04/2020  . COPD (chronic obstructive pulmonary disease) ([place]) 12/04/2020  . Mass of right lung 12/04/2020  . Tobacco abuse 09/30/2015  . Breast cancer of upper-outer quadrant of left female breast ([place]) 08/18/2015  . Essential hypertension, benign 05/22/2014  . Mixed hyperlipidemia 05/22/2014    Past Surgical History:  Procedure Laterality Date  . ABDOMINAL HYSTERECTOMY    . ABDOMINAL HYSTERECTOMY    . APPENDECTOMY    . CHOLECYSTECTOMY    . EYE SURGERY     cataracts  . RADIOACTIVE SEED GUIDED PARTIAL MASTECTOMY WITH AXILLARY SENTINEL LYMPH NODE BIOPSY Left 09/17/2015   Procedure: RADIOACTIVE SEED LOCALIZATION LEFT BREAST LUMPECTOMY AND LEFT AXILLARY SENTINEL LYMPH NODE BIOPSY;  Surgeon: Excell Seltzer, MD;  Location: [facility];  Service: General;  Laterality: Left;  . TONSILLECTOMY       OB History   No obstetric history on file.     Family History  Problem Relation Age of Onset  . Colon cancer Sister   . Lung cancer Sister     Social History   Tobacco Use  . Smoking status: Current Every Day Smoker    Packs/day: 1.00    Years: 70.00    Pack years: 70.00    Types: Cigarettes  . Smokeless tobacco: Never Used  . Tobacco comment: less then 1 pack smoked per day 12/17/20 [facility]   Vaping Use  . Vaping Use: Never used  Substance Use Topics  . Alcohol use: Yes    Comment: rare  . Drug use: No    Home Medications Prior to Admission medications   Medication Sig Start Date End Date Taking? Authorizing Provider   albuterol (VENTOLIN HFA) 108 (90 Base) MCG/ACT inhaler Inhale 2 puffs into the lungs every 2 (two) hours as needed for wheezing or shortness of breath. Please instruct in usage Patient taking differently: Inhale 2 puffs into the lungs every 2 (two) hours as needed for wheezing or shortness of breath. 12/13/20  Yes Samuella Cota, MD  anastrozole (ARIMIDEX) 1 MG tablet Take 1 mg by mouth daily. 12/23/20  Yes [provider]  aspirin EC 81 MG tablet Take 81 mg by mouth daily.   Yes [provider]  Cholecalciferol (VITAMIN D3) 5000 UNITS TABS Take 5,000 Units by mouth daily.   Yes [provider]  metFORMIN (GLUCOPHAGE-XR) 500 MG 24 hr tablet Take 1 tablet (500 mg total) by mouth 2 (two) times daily. Patient taking differently: Take 100 mg by mouth daily with breakfast. 12/06/20 03/06/21 Yes Dahal, Marlowe Aschoff, MD  pantoprazole (PROTONIX) 40 MG tablet Take 40 mg by mouth daily before breakfast. 09/24/20  Yes [provider]  pentoxifylline (TRENTAL) 400 MG CR tablet Take 1 tablet (400 mg total) by mouth 3 (three) times daily with meals. Patient taking differently: Take 800 mg by mouth in the morning. 06/03/20  Yes Nicholas Lose, MD  pioglitazone (ACTOS) 45 MG tablet Take 45 mg by mouth daily. 12/18/20  Yes [provider]  quinapril (ACCUPRIL) 40 MG tablet Take 40 mg by mouth daily.    Yes [provider]  rosuvastatin (CRESTOR) 40 MG tablet Take 40 mg by mouth daily.   Yes [provider]  verapamil (CALAN-SR) 240 MG CR tablet Take 240 mg by mouth daily. 06/10/15  Yes [provider]  Tiotropium Bromide-Olodaterol (STIOLTO RESPIMAT) 2.5-2.5 MCG/ACT AERS Inhale 2 puffs into the lungs daily. 12/17/20   Collene Gobble, MD    Allergies    Ferumoxytol, Pneumococcal vaccines, and Repaglinide  Review of Systems   Review of Systems  Constitutional: Negative for chills and fever.  HENT: Negative for ear pain and sore throat.   Eyes: Negative  for pain and visual disturbance.  Respiratory: Negative for cough and shortness of breath.   Cardiovascular: Positive for chest pain. Negative for palpitations.  Gastrointestinal: Negative for abdominal pain and vomiting.  Genitourinary: Negative for dysuria and hematuria.  Musculoskeletal: Negative for arthralgias and back pain.  Skin: Negative for color change and rash.  Neurological: Negative for seizures and syncope.  All other systems reviewed and are negative.   Physical Exam Updated Vital Signs BP 114/70   Pulse 72   Temp 97.6 F (36.4 C) (Oral)   Resp 20   SpO2 95%   Physical Exam Vitals and nursing note reviewed.  Constitutional:      General: She is not in acute distress.    Appearance: She is well-developed.  HENT:     Head: Normocephalic and atraumatic.  Eyes:     Conjunctiva/sclera: Conjunctivae normal.  Cardiovascular:     Rate and Rhythm: Normal rate and regular rhythm.     Heart sounds: No murmur heard.   Pulmonary:     Effort: Pulmonary effort is normal. No respiratory distress.     Breath sounds: Wheezing present.  Abdominal:     General: There is no distension.     Palpations: Abdomen is soft.     Tenderness: There is no abdominal tenderness. There is no right CVA tenderness or left CVA tenderness.  Musculoskeletal:        General: No swelling or tenderness. Normal range of motion.     Cervical back: Neck supple.  Skin:    General: Skin is warm and dry.  Neurological:     General: No focal deficit present.     Mental Status: She is alert and oriented to person, place, and time. Mental status is at baseline.     Cranial Nerves: No cranial nerve deficit.     ED Results / Procedures / Treatments   Labs (all labs ordered are listed, but only abnormal results are displayed) Labs Reviewed  BASIC METABOLIC PANEL - Abnormal; Notable for the following components:      Result Value   Sodium 131 (*)    Potassium 3.0 (*)    Chloride 93 (*)     Glucose, Bld 385 (*)    Creatinine, Ser 1.51 (*)    GFR, Estimated 35 (*)    All other components within normal limits  CBC - Abnormal; Notable for the following components:   RDW 21.8 (*)    All other components within normal limits  HEPATIC FUNCTION PANEL - Abnormal; Notable for the following components:   Total Protein 5.8 (*)    Albumin 2.7 (*)    AST 12 (*)    All other components within normal limits  TROPONIN I (HIGH SENSITIVITY) - Abnormal; Notable for the following components:   Troponin I (High Sensitivity) 18 (*)    All other components within normal limits  TROPONIN I (HIGH SENSITIVITY) - Abnormal; Notable for the following components:   Troponin I (High Sensitivity) 19 (*)    All other components within normal limits  SARS CORONAVIRUS 2 (TAT 6-24 HRS)  LIPASE, BLOOD  URINALYSIS, ROUTINE W REFLEX MICROSCOPIC  MAGNESIUM  MAGNESIUM  COMPREHENSIVE METABOLIC PANEL  CBC    EKG None  Radiology DG Chest 2 View  Result Date: 01/10/2021 CLINICAL DATA:  Chest pain, short of breath, right lung mass EXAM: CHEST - 2 VIEW COMPARISON:  01/02/2021, 01/01/2021 FINDINGS: Frontal and lateral views of the chest demonstrate an unremarkable cardiac silhouette. Stable right middle lobe mass consistent with likely bronchogenic malignancy based on previous CT findings. No new airspace disease, effusion, or pneumothorax. No acute bony abnormalities. IMPRESSION: 1. Stable right middle lobe mass unchanged since previous CT, most consistent with lung cancer. 2. Otherwise no acute intrathoracic process. Electronically Signed   By: Randa Ngo M.D.   On: 01/10/2021 16:00    Procedures Procedures   Medications Ordered in ED Medications  heparin injection 5,000 Units (has no administration in time range)  0.9 % NaCl with KCl 40 mEq / L  infusion (has no administration in time range)  acetaminophen (TYLENOL) tablet 650 mg (has no administration in time range)    Or  acetaminophen (TYLENOL)  suppository 650 mg (has no administration in time range)  ondansetron (ZOFRAN) tablet 4 mg (has no administration in time range)    Or  ondansetron (ZOFRAN) injection 4 mg (has no administration in time range)  senna-docusate (Senokot-S) tablet 1 tablet (has no administration in time range)  albuterol (VENTOLIN HFA) 108 (90 Base) MCG/ACT inhaler 2 puff (has no administration in time range)  aspirin EC tablet 81 mg (has no administration in time range)  anastrozole (ARIMIDEX) tablet 1 mg (has no administration in time range)  pantoprazole (PROTONIX) EC tablet 40 mg (has no administration in time range)  pentoxifylline (TRENTAL) CR tablet 400 mg (has no administration in time range)  rosuvastatin (CRESTOR) tablet 40 mg (has no administration in time range)  verapamil (CALAN-SR) CR tablet 240 mg (has no administration in time range)  arformoterol (BROVANA) nebulizer solution 15 mcg (has no administration in time range)  umeclidinium bromide (INCRUSE ELLIPTA) 62.5 MCG/INH 1 puff (has no administration in time range)  ipratropium-albuterol (DUONEB) 0.5-2.5 (3) MG/3ML nebulizer solution 3 mL (has no administration in time range)  insulin aspart (novoLOG) injection 0-15 Units (has no administration in time range)  insulin aspart (novoLOG) injection 0-5 Units (has no administration in time range)  aspirin chewable tablet 324 mg (324 mg Oral Given 01/10/21 1709)  potassium chloride SA (KLOR-CON) CR tablet 20 mEq (20 mEq Oral Given 01/10/21 2121)  lactated ringers bolus 1,000 mL (1,000 mLs Intravenous New Bag/Given 01/10/21 2121)    ED Course  I have reviewed the triage vital signs and the nursing notes.  Pertinent labs & imaging results that were available during my care of the patient were reviewed by me and considered in my medical decision making (see chart for details).    MDM Rules/Calculators/A&P HEAR Score: 5                        Patient is a 80 year old female with a extensive medical  history presenting with a chief complaint of failure to thrive and chest pain.  Patient's chest pain is similar over the past weeks secondary to her known intrathoracic cancer.  Patient endorses no change in her symptoms from this regards.  However patient son raises a secondary concern of failure to thrive.  He states that she has not been eating or drinking and has been running out of her medicines. Given this history we will proceed with delta troponin, CBC, CMP, chest x-ray to evaluate for new hematologic, anatomic or metabolic causes of her progressive confusion and altered mental status. Objective evaluation notes multiple electrolyte abnormalities and decreasing kidney function consistent with her known poor p.o. tolerance in the outpatient setting.  Labs otherwise unremarkable at this time.  Troponin stable at 18 and 19 in the setting of this AKI.  Communicated with hospitalist for admission given her multiple metabolic derangements that require close correction in the inpatient setting.  Family at bedside informed of admission plan for failure to thrive and expressed understanding.  Final Clinical Impression(s) / ED Diagnoses Final diagnoses:  Abdominal pain, unspecified abdominal location    Rx / DC Orders ED Discharge Orders    None       Tretha Sciara, MD 01/10/21 5790    Charlesetta Shanks, MD 01/13/21 1103

## 2021-01-11 ENCOUNTER — Other Ambulatory Visit: Payer: Self-pay

## 2021-01-11 DIAGNOSIS — R079 Chest pain, unspecified: Secondary | ICD-10-CM | POA: Diagnosis not present

## 2021-01-11 DIAGNOSIS — F17210 Nicotine dependence, cigarettes, uncomplicated: Secondary | ICD-10-CM | POA: Diagnosis not present

## 2021-01-11 DIAGNOSIS — N179 Acute kidney failure, unspecified: Secondary | ICD-10-CM | POA: Diagnosis not present

## 2021-01-11 DIAGNOSIS — Z7984 Long term (current) use of oral hypoglycemic drugs: Secondary | ICD-10-CM | POA: Diagnosis not present

## 2021-01-11 DIAGNOSIS — I152 Hypertension secondary to endocrine disorders: Secondary | ICD-10-CM

## 2021-01-11 DIAGNOSIS — Z79899 Other long term (current) drug therapy: Secondary | ICD-10-CM | POA: Diagnosis not present

## 2021-01-11 DIAGNOSIS — E119 Type 2 diabetes mellitus without complications: Secondary | ICD-10-CM | POA: Diagnosis not present

## 2021-01-11 DIAGNOSIS — R918 Other nonspecific abnormal finding of lung field: Secondary | ICD-10-CM | POA: Diagnosis not present

## 2021-01-11 DIAGNOSIS — Z20822 Contact with and (suspected) exposure to covid-19: Secondary | ICD-10-CM | POA: Diagnosis not present

## 2021-01-11 DIAGNOSIS — I10 Essential (primary) hypertension: Secondary | ICD-10-CM | POA: Diagnosis not present

## 2021-01-11 DIAGNOSIS — E785 Hyperlipidemia, unspecified: Secondary | ICD-10-CM | POA: Diagnosis not present

## 2021-01-11 DIAGNOSIS — R778 Other specified abnormalities of plasma proteins: Secondary | ICD-10-CM | POA: Diagnosis not present

## 2021-01-11 DIAGNOSIS — J441 Chronic obstructive pulmonary disease with (acute) exacerbation: Secondary | ICD-10-CM

## 2021-01-11 DIAGNOSIS — E1169 Type 2 diabetes mellitus with other specified complication: Secondary | ICD-10-CM | POA: Diagnosis not present

## 2021-01-11 DIAGNOSIS — Z01818 Encounter for other preprocedural examination: Secondary | ICD-10-CM | POA: Diagnosis not present

## 2021-01-11 DIAGNOSIS — E1159 Type 2 diabetes mellitus with other circulatory complications: Secondary | ICD-10-CM | POA: Diagnosis not present

## 2021-01-11 DIAGNOSIS — J449 Chronic obstructive pulmonary disease, unspecified: Secondary | ICD-10-CM | POA: Diagnosis not present

## 2021-01-11 DIAGNOSIS — Z853 Personal history of malignant neoplasm of breast: Secondary | ICD-10-CM | POA: Diagnosis not present

## 2021-01-11 DIAGNOSIS — Z7982 Long term (current) use of aspirin: Secondary | ICD-10-CM | POA: Diagnosis not present

## 2021-01-11 DIAGNOSIS — R627 Adult failure to thrive: Secondary | ICD-10-CM | POA: Diagnosis not present

## 2021-01-11 LAB — COMPREHENSIVE METABOLIC PANEL
ALT: 9 U/L (ref 0–44)
AST: 12 U/L — ABNORMAL LOW (ref 15–41)
Albumin: 2.3 g/dL — ABNORMAL LOW (ref 3.5–5.0)
Alkaline Phosphatase: 64 U/L (ref 38–126)
Anion gap: 8 (ref 5–15)
BUN: 13 mg/dL (ref 8–23)
CO2: 30 mmol/L (ref 22–32)
Calcium: 8.7 mg/dL — ABNORMAL LOW (ref 8.9–10.3)
Chloride: 98 mmol/L (ref 98–111)
Creatinine, Ser: 1.45 mg/dL — ABNORMAL HIGH (ref 0.44–1.00)
GFR, Estimated: 37 mL/min — ABNORMAL LOW (ref >60)
Glucose, Bld: 126 mg/dL — ABNORMAL HIGH (ref 70–99)
Potassium: 3.5 mmol/L (ref 3.5–5.1)
Sodium: 136 mmol/L (ref 135–145)
Total Bilirubin: 0.4 mg/dL (ref 0.3–1.2)
Total Protein: 4.9 g/dL — ABNORMAL LOW (ref 6.5–8.1)

## 2021-01-11 LAB — CBC
HCT: 35 % — ABNORMAL LOW (ref 36.0–46.0)
Hemoglobin: 11.4 g/dL — ABNORMAL LOW (ref 12.0–15.0)
MCH: 26.7 pg (ref 26.0–34.0)
MCHC: 32.6 g/dL (ref 30.0–36.0)
MCV: 82 fL (ref 80.0–100.0)
Platelets: 273 10*3/uL (ref 150–400)
RBC: 4.27 MIL/uL (ref 3.87–5.11)
RDW: 21.8 % — ABNORMAL HIGH (ref 11.5–15.5)
WBC: 7.8 10*3/uL (ref 4.0–10.5)
nRBC: 0 % (ref 0.0–0.2)

## 2021-01-11 LAB — GLUCOSE, CAPILLARY
Glucose-Capillary: 186 mg/dL — ABNORMAL HIGH (ref 70–99)
Glucose-Capillary: 220 mg/dL — ABNORMAL HIGH (ref 70–99)
Glucose-Capillary: 193 mg/dL — ABNORMAL HIGH (ref 70–99)
Glucose-Capillary: 171 mg/dL — ABNORMAL HIGH (ref 70–99)

## 2021-01-11 LAB — SARS CORONAVIRUS 2 (TAT 6-24 HRS): SARS Coronavirus 2: NEGATIVE

## 2021-01-11 LAB — MAGNESIUM: Magnesium: 1.5 mg/dL — ABNORMAL LOW (ref 1.7–2.4)

## 2021-01-11 LAB — TROPONIN I (HIGH SENSITIVITY): Troponin I (High Sensitivity): 16 ng/L (ref <18)

## 2021-01-11 MED ORDER — MORPHINE SULFATE (PF) 2 MG/ML IV SOLN
2.0000 mg | INTRAVENOUS | Status: DC | PRN
  Administered 2021-01-11 – 2021-01-12 (×2): 2 mg via INTRAVENOUS
  Filled 2021-01-11 (×2): qty 1

## 2021-01-11 MED ORDER — NICOTINE 14 MG/24HR TD PT24
14.0000 mg | MEDICATED_PATCH | Freq: Every day | TRANSDERMAL | Status: DC
  Administered 2021-01-11 – 2021-01-13 (×3): 14 mg via TRANSDERMAL
  Filled 2021-01-11 (×3): qty 1

## 2021-01-11 MED ORDER — MAGNESIUM SULFATE 2 GM/50ML IV SOLN
2.0000 g | Freq: Once | INTRAVENOUS | Status: AC
  Administered 2021-01-11: 2 g via INTRAVENOUS
  Filled 2021-01-11: qty 50

## 2021-01-11 MED ORDER — NITROGLYCERIN 0.4 MG SL SUBL
0.4000 mg | SUBLINGUAL_TABLET | SUBLINGUAL | Status: DC | PRN
  Administered 2021-01-11 – 2021-01-12 (×2): 0.4 mg via SUBLINGUAL
  Filled 2021-01-11: qty 1

## 2021-01-11 NOTE — Evaluation (Signed)
Physical Therapy Evaluation Patient Details Name: Dawn [name] MRN: [id number] DOB: 09-Apr-1941 Today's Date: 01/11/2021   History of Present Illness  This 80 y.o. female admitted with generalized weakness, poor oral intake, and chest pressure.  Work up underway.  Dx thus far:  AKI, FTT, hypokalemia.  PMH:  recent discovery of Rt middle lobe lung mass presumed primary bronchogenic carcinoma (pt awaiting bronchoscopy as OP); ongoing tobacco use, DM, COPD, h/o breast CA  Clinical Impression  Patient presents with decreased mobility due to general weakness, decreased balance, decreased activity tolerance with SOB.  She denies falls, but holds walls, etc and admits to not taking good care of herself.  Agreeable to STSNF level rehab at d/c.  PT will continue to follow acutely.     Follow Up Recommendations SNF    Equipment Recommendations  None recommended by PT    Recommendations for Other Services       Precautions / Restrictions Precautions Precautions: Fall      Mobility  Bed Mobility   Bed Mobility: Supine to Sit;Sit to Supine     Supine to sit: Min guard Sit to supine: Min guard   General bed mobility comments: increased time, assist for balance    Transfers Overall transfer level: Needs assistance Equipment used: Rolling walker (2 wheeled) Transfers: Sit to/from Stand Sit to Stand: Min assist;Mod assist;Max assist         General transfer comment: stood from EOB min A, from low toilet seat mod to max A to stand and increased time using grabbar  Ambulation/Gait Ambulation/Gait assistance: Min assist Gait Distance (Feet): 15 Feet (x 2) Assistive device: Rolling walker (2 wheeled) Gait Pattern/deviations: Step-through pattern;Decreased stride length;Shuffle     General Gait Details: limited foot clearance, using walker for safety, walked to bathroom, then to sink to wash hands prior to sitting on bed.  Stairs            Wheelchair Mobility    Modified  Rankin (Stroke Patients Only) Modified Rankin (Stroke Patients Only) Pre-Morbid Rankin Score: Slight disability Modified Rankin: Moderately severe disability     Balance Overall balance assessment: Needs assistance   Sitting balance-Leahy Scale: Good       Standing balance-Leahy Scale: Fair Standing balance comment: can stand to wash hands at sink                             Pertinent Vitals/Pain Pain Assessment: Faces Faces Pain Scale: Hurts little more Pain Location: chest Pain Descriptors / Indicators: Discomfort Pain Intervention(s): Monitored during session;Repositioned    Home Living Family/patient expects to be discharged to:: Private residence Living Arrangements: Alone Available Help at Discharge: Available PRN/intermittently Type of Home: Other(Comment) (condo) Home Access: Stairs to enter   Entrance Stairs-Number of Steps: 2 Home Layout: Two level;Able to live on main level with bedroom/bathroom Home Equipment: Walker - 2 wheels Additional Comments: Pt has intermittent assist from son, but he, per her report is out of town a lot and works long hours    Prior Function Level of Independence: Needs assistance      ADL's / Homemaking Assistance Needed: Pt reports she has been mostly sponge bathing.  She reports mod I with ADLs, but fatigues.  Requires assist for grocery shopping and occasional meal prep.  Poor intake        Hand Dominance        Extremity/Trunk Assessment   Upper Extremity Assessment Upper Extremity  Assessment: Defer to OT evaluation    Lower Extremity Assessment Lower Extremity Assessment: Generalized weakness       Communication   Communication: No difficulties  Cognition Arousal/Alertness: Awake/alert Behavior During Therapy: WFL for tasks assessed/performed Overall Cognitive Status: Impaired/Different from baseline Area of Impairment: Memory;Safety/judgement                     Memory: Decreased  short-term memory   Safety/Judgement: Decreased awareness of safety;Decreased awareness of deficits            General Comments      Exercises     Assessment/Plan    PT Assessment Patient needs continued PT services  PT Problem List Decreased strength;Decreased mobility;Decreased balance;Decreased knowledge of use of DME;Decreased knowledge of precautions;Decreased safety awareness       PT Treatment Interventions DME instruction;Therapeutic activities;Balance training;Patient/family education;Therapeutic exercise;Functional mobility training;Gait training    PT Goals (Current goals can be found in the Care Plan section)  Acute Rehab PT Goals Patient Stated Goal: to get stronger PT Goal Formulation: With patient Time For Goal Achievement: 01/25/21 Potential to Achieve Goals: Good    Frequency Min 2X/week   Barriers to discharge Decreased caregiver support      Co-evaluation               AM-PAC PT "6 Clicks" Mobility  Outcome Measure Help needed turning from your back to your side while in a flat bed without using bedrails?: A Little Help needed moving from lying on your back to sitting on the side of a flat bed without using bedrails?: A Little Help needed moving to and from a bed to a chair (including a wheelchair)?: A Little Help needed standing up from a chair using your arms (e.g., wheelchair or bedside chair)?: A Lot Help needed to walk in hospital room?: A Little Help needed climbing 3-5 steps with a railing? : A Lot 6 Click Score: 16    End of Session   Activity Tolerance: Patient limited by fatigue Patient left: in bed;with call bell/phone within reach;with bed alarm set   PT Visit Diagnosis: Other abnormalities of gait and mobility (R26.89);Muscle weakness (generalized) (M62.81)    Time: 7322-0254 PT Time Calculation (min) (ACUTE ONLY): 24 min   Charges:   PT Evaluation $PT Eval Moderate Complexity: 1 Mod PT Treatments $Gait Training:  8-22 mins        [name], PT Acute Rehabilitation Services [facility]:[phone] [phone] 01/11/2021   [name] 01/11/2021, 6:44 PM

## 2021-01-11 NOTE — Evaluation (Signed)
Occupational Therapy Evaluation Patient Details Name: Dawn [name] MRN: [id number] DOB: Oct 23, 1940 Today's Date: 01/11/2021    History of Present Illness This 80 y.o. female admitted with generalized weakness, poor oral intake, and chest pressure.  Work up underway.  Dx thus far:  AKI, FTT, hypokalemia.  PMH:  recent discovery of Rt middle lobe lung mass presumed primary bronchogenic carcinoma (pt awaiting bronchoscopy as OP); ongoing tobacco use, DM, COPD, h/o breast CA   Clinical Impression   Pt admitted with above. She demonstrates the below listed deficits and will benefit from continued OT to maximize safety and independence with BADLs.  Pt presents to OT with generalized weakness, decreased activity tolerance, impaired balance, and impaired cognition.  She requires set up to mod A for ADLs, and min guard to mod A for functional mobility.  She demonstrated LOB when fatigued requiring mod A to recover.  She did complain of dizziness and was + for othostatic hypotension (see below).  She reports she lives alone and has limited support at discharge.  Discussed SNF level rehab with her, and she is agreeable.    BP seated 133/58, HR 87 BP standing 114/58, HR 107      Follow Up Recommendations  SNF    Equipment Recommendations  Tub/shower bench    Recommendations for Other Services       Precautions / Restrictions        Mobility Bed Mobility Overal bed mobility: Needs Assistance Bed Mobility: Supine to Sit;Sit to Supine     Supine to sit: Min guard Sit to supine: Min guard   General bed mobility comments: required increased time to problem solve how to get out of the bed.  Heavy reliance on bedrails    Transfers Overall transfer level: Needs assistance Equipment used: 1 person hand held assist Transfers: Sit to/from [facility] Sit to Stand: Min guard;Mod assist Stand pivot transfers: Min assist       General transfer comment: Pt initially required  min guard to move sit to stand, but as she fatigued, she required mod A    Balance Overall balance assessment: Needs assistance Sitting-balance support: Feet supported Sitting balance-Leahy Scale: Good     Standing balance support: During functional activity;No upper extremity supported Standing balance-Leahy Scale: Fair                             ADL either performed or assessed with clinical judgement   ADL Overall ADL's : Needs assistance/impaired Eating/Feeding: Independent   Grooming: Wash/dry hands;Wash/dry face;Brushing hair;Minimal assistance Grooming Details (indicate cue type and reason): Pt with LOB, requiring min A to recover.  Pt with c/o dizziness and orthostatics taken - see general comments for details Upper Body Bathing: Set up;Supervision/ safety;Sitting   Lower Body Bathing: Moderate assistance;Sit to/from stand Lower Body Bathing Details (indicate cue type and reason): requires mod A to stand when fatigued Upper Body Dressing : Supervision/safety;Set up;Sitting   Lower Body Dressing: Moderate assistance;Sit to/from stand Lower Body Dressing Details (indicate cue type and reason): mod A for sit to stand when fatigued Toilet Transfer: Moderate assistance;Ambulation;Comfort height toilet;Grab bars   Toileting- Clothing Manipulation and Hygiene: Moderate assistance;Sit to/from stand       Functional mobility during ADLs: Minimal assistance;Moderate assistance General ADL Comments: Pt fatigues rapidly with activity.  She also was limited by orthostatic hypotension     Vision Patient Visual Report: No change from baseline  Perception     Praxis      Pertinent Vitals/Pain Pain Assessment: Faces Faces Pain Scale: Hurts little more Pain Location: Lt foot with palpation Pain Descriptors / Indicators: Guarding;Grimacing Pain Intervention(s): Monitored during session     Hand Dominance Left   Extremity/Trunk Assessment Upper Extremity  Assessment Upper Extremity Assessment: Generalized weakness   Lower Extremity Assessment Lower Extremity Assessment: Defer to PT evaluation   Cervical / Trunk Assessment Cervical / Trunk Assessment: Normal   Communication Communication Communication: No difficulties   Cognition Arousal/Alertness: Awake/alert Behavior During Therapy: WFL for tasks assessed/performed Overall Cognitive Status: Impaired/Different from baseline Area of Impairment: Orientation;Attention;Memory;Following commands                 Orientation Level: Disoriented to;Situation Current Attention Level: Selective Memory: Decreased short-term memory Following Commands: Follows one step commands consistently;Follows multi-step commands consistently       General Comments: Pt is oriented to time, but then will get a bit confused about which day it is and the month.  She knows she is in the hospital, but doesn't know what for.  She reports she was up walking in her room prior to OT session, however, nurse confirms this is not the case.  Pt is aware that she is confused and will cite this several times during session   General Comments  Pt with c/o dizziness while standing.  BP seated 133/58, HR 87; standing 114/58, HR 107.  Pt with edema Lt foot and c/o pain along lateral aspect when palpated    Exercises     Shoulder Instructions      Home Living Family/patient expects to be discharged to:: Private residence Living Arrangements: Alone Available Help at Discharge: Available PRN/intermittently Type of Home: Other(Comment) (condo) Home Access: Stairs to enter Entrance Stairs-Number of Steps: 2 Entrance Stairs-Rails: Right;Left Home Layout: Two level;Able to live on main level with bedroom/bathroom     Bathroom Shower/Tub: Teacher, early years/pre: Standard     Home Equipment: Environmental consultant - 2 wheels   Additional Comments: Pt has intermittent assist from son, but he, per her report is out of  town a lot and works long hours      Prior Functioning/Environment Level of Independence: Needs Product/process development scientist / Transfers Assistance Needed: Pt reports she has been ambulating in her home without AD, but she has a RW in her living room ADL's / Homemaking Assistance Needed: Pt reports she has been mostly sponge bathing.  She reports mod I with ADLs, but fatigues.  Requires assist for grocery shopping and occasional meal prep.  Poor intake   Comments: denies falls in past 6 months        OT Problem List: Decreased strength;Decreased activity tolerance;Impaired balance (sitting and/or standing);Decreased cognition;Decreased safety awareness      OT Treatment/Interventions: Self-care/ADL training;Therapeutic exercise;DME and/or AE instruction;Energy conservation;Therapeutic activities;Cognitive remediation/compensation;Patient/family education;Balance training    OT Goals(Current goals can be found in the care plan section) Acute Rehab OT Goals Patient Stated Goal: to get stronger OT Goal Formulation: With patient Time For Goal Achievement: 01/25/21 Potential to Achieve Goals: Good ADL Goals Pt Will Perform Grooming: with supervision;standing Pt Will Perform Lower Body Bathing: with supervision;sit to/from stand Pt Will Perform Lower Body Dressing: with supervision;sit to/from stand Pt Will Transfer to Toilet: with supervision;ambulating;regular height toilet Pt Will Perform Toileting - Clothing Manipulation and hygiene: with supervision;sit to/from stand Pt/caregiver will Perform Home Exercise Program: Increased strength;Right Upper extremity;Left upper extremity;With Supervision;With theraband Additional  ADL Goal #1: Pt will actively participate in 20 mins therapeutic activity with no more than 2 rest breaks  OT Frequency: Min 2X/week   Barriers to D/C: Decreased caregiver support          Co-evaluation              AM-PAC OT "6 Clicks" Daily Activity     Outcome  Measure Help from another person eating meals?: None Help from another person taking care of personal grooming?: A Little Help from another person toileting, which includes using toliet, bedpan, or urinal?: A Lot Help from another person bathing (including washing, rinsing, drying)?: A Lot Help from another person to put on and taking off regular upper body clothing?: A Little Help from another person to put on and taking off regular lower body clothing?: A Lot 6 Click Score: 16   End of Session Nurse Communication: Mobility status  Activity Tolerance: Patient limited by fatigue Patient left: in bed;with call bell/phone within reach;with bed alarm set  OT Visit Diagnosis: Unsteadiness on feet (R26.81);Cognitive communication deficit (R41.841)                Time: 8416-6063 OT Time Calculation (min): 29 min Charges:  OT General Charges $OT Visit: 1 Visit OT Evaluation $OT Eval Moderate Complexity: 1 Mod OT Treatments $Self Care/Home Management : 8-22 mins  Nilsa Nutting., OTR/L Acute Rehabilitation Services Pager [phone] Office [place], [place] 01/11/2021, 10:42 AM

## 2021-01-11 NOTE — Progress Notes (Signed)
PROGRESS NOTE    Dawn [name]  TDD:[id number] DOB: 13-Jul-1941 DOA: 01/10/2021 PCP: Lawerance Cruel, MD  Brief Narrative 80 year old female with history of longstanding tobacco abuse, 2 packs/day, COPD, type 2 diabetes mellitus, hypertension, dyslipidemia, history of breast cancer with known right hilar lung mass concerning for bronchogenic carcinoma-pending cardiology evaluation for bronchoscopy and biopsy presented to the ER yesterday with generalized weakness, ongoing intermittent chest pressure with exertion and overall failure to thrive -Work-up in the ED was notable for creatinine of 1.5 which is higher than baseline less than 1, high-sensitivity troponin was in the high normal range, chest x-ray noted right middle lobe mass  Assessment & Plan:   Intermittent chest pressure -Substernal, worse with exertion -Symptoms concerning for angina although they could be related to the hilar mass as well -EKG without acute findings -Due to see cardiology this week for clearance prior to bronchoscopy -Will request cardiology input -Echocardiogram from 3/20 was unremarkable with preserved LVEF and wall motion  Acute kidney injury -Likely prerenal -Holding metformin and quinapril -Discontinue IV fluids, renal ultrasound is unremarkable  Right lung mass -Concerning for primary bronchogenic carcinoma -Being worked up by Dr. Lamonte Sakai, plan for bronchoscopy following cardiac clearance  COPD Ongoing significant tobacco abuse -With intermittent wheezing, continue Brovana, duo nebs, albuterol PRN -Not requiring supplemental O2 at this time -Check ambulatory O2 sats prior to discharge  Type 2 diabetes mellitus -Actos and metformin on hold -Continue sliding scale insulin  Hypertension -Holding quinapril, continue verapamil  History of left breast cancer s/p lumpectomy and adjuvant radiation therapy 2017: Follows with oncology, Dr. Lindi Adie.  Continue anastrozole and pentoxifylline.  Tobacco  use: Still smoking up to 2 packs/day.  - Patient strongly advised on smoking cessation   DVT prophylaxis: Subcu heparin Code Status: Full code Family Communication: No family at bedside Disposition Plan:  Status is: Observation  The patient remains OBS appropriate and will d/c after cardiac eval  Dispo: The patient is from: Home              Anticipated d/c is to: Home              Patient currently is not medically stable to d/c.   Difficult to place patient No  Consultants:   Cardiology   Procedures:   Antimicrobials:    Subjective: -Intermittent chest pressure with exertion for weeks  Objective: Vitals:   01/10/21 2336 01/11/21 0430 01/11/21 0503 01/11/21 0840  BP: 127/62 (!) 164/58 (!) 160/64   Pulse: 77 72 (!) 50   Resp: 16     Temp: 97.8 F (36.6 C) 98.2 F (36.8 C) 97.9 F (36.6 C)   TempSrc: Oral Oral Oral   SpO2: 95% 99% 96% 96%    Intake/Output Summary (Last 24 hours) at 01/11/2021 1002 Last data filed at 01/11/2021 0600 Gross per 24 hour  Intake 1000 ml  Output 400 ml  Net 600 ml   There were no vitals filed for this visit.  Examination:  General exam: Awake alert oriented x3, chronically ill-appearing CVS: S1-S2, regular rate rhythm Lungs: Poor air movement bilaterally Abdomen: Soft, nontender, bowel sounds present Extremities: No edema Skin: No rashes on exposed skin  Psychiatry: Mood & affect appropriate.     Data Reviewed:   CBC: Recent Labs  Lab 01/10/21 1551 01/11/21 0305  WBC 8.9 7.8  HGB 12.5 11.4*  HCT 39.0 35.0*  MCV 83.0 82.0  PLT 326 623   Basic Metabolic Panel: Recent Labs  Lab 01/10/21 1551  01/11/21 0305  NA 131* 136  K 3.0* 3.5  CL 93* 98  CO2 26 30  GLUCOSE 385* 126*  BUN 15 13  CREATININE 1.51* 1.45*  CALCIUM 9.1 8.7*  MG  --  1.5*   GFR: Estimated Creatinine Clearance: 26.4 mL/min (A) (by C-G formula based on SCr of 1.45 mg/dL (H)). Liver Function Tests: Recent Labs  Lab 01/10/21 1551  01/11/21 0305  AST 12* 12*  ALT 9 9  ALKPHOS 81 64  BILITOT 0.5 0.4  PROT 5.8* 4.9*  ALBUMIN 2.7* 2.3*   Recent Labs  Lab 01/10/21 1551  LIPASE 32   No results for input(s): AMMONIA in the last 168 hours. Coagulation Profile: No results for input(s): INR, PROTIME in the last 168 hours. Cardiac Enzymes: No results for input(s): CKTOTAL, CKMB, CKMBINDEX, TROPONINI in the last 168 hours. BNP (last 3 results) No results for input(s): PROBNP in the last 8760 hours. HbA1C: No results for input(s): HGBA1C in the last 72 hours. CBG: Recent Labs  Lab 01/10/21 2344 01/11/21 0656  GLUCAP 224* 186*   Lipid Profile: No results for input(s): CHOL, HDL, LDLCALC, TRIG, CHOLHDL, LDLDIRECT in the last 72 hours. Thyroid Function Tests: No results for input(s): TSH, T4TOTAL, FREET4, T3FREE, THYROIDAB in the last 72 hours. Anemia Panel: No results for input(s): VITAMINB12, FOLATE, FERRITIN, TIBC, IRON, RETICCTPCT in the last 72 hours. Urine analysis:    Component Value Date/Time   COLORURINE YELLOW 11/25/2009 1109   APPEARANCEUR CLEAR 11/25/2009 1109   LABSPEC 1.015 11/25/2009 1109   PHURINE 6.0 11/25/2009 1109   GLUCOSEU NEGATIVE 11/25/2009 1109   HGBUR SMALL (A) 11/25/2009 1109   BILIRUBINUR NEGATIVE 11/25/2009 1109   KETONESUR NEGATIVE 11/25/2009 1109   PROTEINUR 100 (A) 11/25/2009 1109   UROBILINOGEN 0.2 11/25/2009 1109   NITRITE NEGATIVE 11/25/2009 1109   LEUKOCYTESUR NEGATIVE 11/25/2009 1109   Sepsis Labs: @LABRCNTIP (procalcitonin:4,lacticidven:4)  ) Recent Results (from the past 240 hour(s))  Resp Panel by RT-PCR (Flu A&B, Covid) Nasopharyngeal Swab     Status: None   Collection Time: 01/01/21  4:33 PM   Specimen: Nasopharyngeal Swab; Nasopharyngeal(NP) swabs in vial transport medium  Result Value Ref Range Status   SARS Coronavirus 2 by RT PCR NEGATIVE NEGATIVE Final    Comment: (NOTE) SARS-CoV-2 target nucleic acids are NOT DETECTED.  The SARS-CoV-2 RNA is  generally detectable in upper respiratory specimens during the acute phase of infection. The lowest concentration of SARS-CoV-2 viral copies this assay can detect is 138 copies/mL. A negative result does not preclude SARS-Cov-2 infection and should not be used as the sole basis for treatment or other patient management decisions. A negative result may occur with  improper specimen collection/handling, submission of specimen other than nasopharyngeal swab, presence of viral mutation(s) within the areas targeted by this assay, and inadequate number of viral copies(<138 copies/mL). A negative result must be combined with clinical observations, patient history, and epidemiological information. The expected result is Negative.  Fact Sheet for Patients:  EntrepreneurPulse.com.au  Fact Sheet for Healthcare Providers:  IncredibleEmployment.be  This test is no t yet approved or cleared by the [place] FDA and  has been authorized for detection and/or diagnosis of SARS-CoV-2 by FDA under an Emergency Use Authorization (EUA). This EUA will remain  in effect (meaning this test can be used) for the duration of the COVID-19 declaration under Section 564(b)(1) of the Act, 21 U.S.C.section 360bbb-3(b)(1), unless the authorization is terminated  or revoked sooner.  Influenza A by PCR NEGATIVE NEGATIVE Final   Influenza B by PCR NEGATIVE NEGATIVE Final    Comment: (NOTE) The Xpert Xpress SARS-CoV-2/FLU/RSV plus assay is intended as an aid in the diagnosis of influenza from Nasopharyngeal swab specimens and should not be used as a sole basis for treatment. Nasal washings and aspirates are unacceptable for Xpert Xpress SARS-CoV-2/FLU/RSV testing.  Fact Sheet for Patients: EntrepreneurPulse.com.au  Fact Sheet for Healthcare Providers: IncredibleEmployment.be  This test is not yet approved or cleared by the [place] FDA and has been authorized for detection and/or diagnosis of SARS-CoV-2 by FDA under an Emergency Use Authorization (EUA). This EUA will remain in effect (meaning this test can be used) for the duration of the COVID-19 declaration under Section 564(b)(1) of the Act, 21 U.S.C. section 360bbb-3(b)(1), unless the authorization is terminated or revoked.  Performed at [facility], [address] [place], [place] [address]   SARS CORONAVIRUS 2 (TAT 6-24 HRS) Nasopharyngeal Nasopharyngeal Swab     Status: None   Collection Time: 01/10/21  9:46 PM   Specimen: Nasopharyngeal Swab  Result Value Ref Range Status   SARS Coronavirus 2 NEGATIVE NEGATIVE Final    Comment: (NOTE) SARS-CoV-2 target nucleic acids are NOT DETECTED.  The SARS-CoV-2 RNA is generally detectable in upper and lower respiratory specimens during the acute phase of infection. Negative results do not preclude SARS-CoV-2 infection, do not rule out co-infections with other pathogens, and should not be used as the sole basis for treatment or other patient management decisions. Negative results must be combined with clinical observations, patient history, and epidemiological information. The expected result is Negative.  Fact Sheet for Patients: SugarRoll.be  Fact Sheet for Healthcare Providers: https://www.woods-mathews.com/  This test is not yet approved or cleared by the [place] FDA and  has been authorized for detection and/or diagnosis of SARS-CoV-2 by FDA under an Emergency Use Authorization (EUA). This EUA will remain  in effect (meaning this test can be used) for the duration of the COVID-19 declaration under Se ction 564(b)(1) of the Act, 21 U.S.C. section 360bbb-3(b)(1), unless the authorization is terminated or revoked sooner.  Performed at [facility], [place] [address], [place],  [address]      Radiology Studies: DG Chest 2  View  Result Date: 01/10/2021 CLINICAL DATA:  Chest pain, short of breath, right lung mass EXAM: CHEST - 2 VIEW COMPARISON:  01/02/2021, 01/01/2021 FINDINGS: Frontal and lateral views of the chest demonstrate an unremarkable cardiac silhouette. Stable right middle lobe mass consistent with likely bronchogenic malignancy based on previous CT findings. No new airspace disease, effusion, or pneumothorax. No acute bony abnormalities. IMPRESSION: 1. Stable right middle lobe mass unchanged since previous CT, most consistent with lung cancer. 2. Otherwise no acute intrathoracic process. Electronically Signed   By: Randa Ngo M.D.   On: 01/10/2021 16:00   Abd 1 View (KUB)  Result Date: 01/10/2021 CLINICAL DATA:  Initial evaluation for acute kidney injury, lung mass. EXAM: ABDOMEN - 1 VIEW COMPARISON:  Prior MRI from 03/17/2020. FINDINGS: Bowel gas pattern within normal limits without obstruction or ileus. No appreciable abnormal bowel wall thickening. No visible free air on this limited supine view the abdomen. No soft tissue mass or abnormal calcification. Cholecystectomy clips overlie the right upper quadrant. Additional surgical clip present at the left hemipelvis. Degenerative spondylosis noted within the lower lumbar spine. No acute osseous finding. Visualized lung bases are grossly clear. IMPRESSION: Nonobstructive bowel gas pattern with  no radiographic evidence for acute intra-abdominal pathology. Electronically Signed   By: Jeannine Boga M.D.   On: 01/10/2021 23:19   US RENAL  Result Date: 01/11/2021 CLINICAL DATA:  Acute kidney injury EXAM: RENAL / URINARY TRACT ULTRASOUND COMPLETE COMPARISON:  Ultrasound dated March 12, 2020.  MRI dated February 16, 2020 FINDINGS: Right Kidney: Renal measurements: 10.4.1 x 6.5 cm = volume: 150 mL. There is no hydronephrosis. The echogenicity is increased. There is a 1.5 cm hypoechoic lesion in the upper pole that is favored to represent a cyst. Left Kidney: Renal  measurements: 12.1 x 3.7 x 4.3 cm = volume: 101.4 mL. The echogenicity is increased. There is no hydronephrosis. There is a prominent extrarenal pelvis. Again noted is a cystic appearing lesion involving the interpolar region measuring approximately 1.6 cm Bladder: There is a questionable filling defect within the urinary bladder near the right UVJ. Other: None. IMPRESSION: 1. No hydronephrosis. 2. Echogenic kidneys bilaterally which can be seen in patients with medical renal disease. 3. Questionable filling defect in the urinary bladder at the level of the right UVJ. Outpatient urology follow-up is recommended for this finding. Electronically Signed   By: Constance Holster M.D.   On: 01/11/2021 00:01    Scheduled Meds: . anastrozole  1 mg Oral Daily  . arformoterol  15 mcg Nebulization BID  . aspirin EC  81 mg Oral Daily  . heparin  5,000 Units Subcutaneous Q8H  . insulin aspart  0-15 Units Subcutaneous TID WC  . insulin aspart  0-5 Units Subcutaneous QHS  . pantoprazole  40 mg Oral QAC breakfast  . pentoxifylline  400 mg Oral TID WC  . rosuvastatin  40 mg Oral Daily  . umeclidinium bromide  1 puff Inhalation Daily  . verapamil  240 mg Oral Daily   Continuous Infusions:   LOS: 0 days    Time spent: 59min  Domenic Polite, MD [facility] 01/11/2021, 10:02 AM

## 2021-01-11 NOTE — Progress Notes (Addendum)
New Admission Note: (Late Entry)  Arrival Method: Via stretcher from ED to 20m01 Mental Orientation: Alert & Oriented x4 (Forgetful) Telemetry: n/a Assessment: Completed Skin: Refer to flowsheet IV: Left AC Pain: 0/10 Tubes: Safety Measures: Safety Fall Prevention Plan discussed with patient. Admission: Completed [facility] [place] Orientation: Patient has been orientated to the room, unit and the staff.  Orders have been reviewed and are being implemented. Will continue to monitor the patient. Call light has been placed within reach and bed alarm has been activated.   Vassie Moselle, RN  Phone Number: [phone]

## 2021-01-11 NOTE — Consult Note (Signed)
Cardiology Consultation:   Patient ID: Dawn [name] MRN: [id number]; DOB: 23-Oct-1940  Admit date: 01/10/2021 Date of Consult: 01/11/2021  PCP:  Lawerance Cruel, [place]  Cardiologist:  Berniece Salines, DO  Advanced Practice Provider:  No care team member to display Electrophysiologist:  None   :[id number]}   Patient Profile:   Dawn [name] is a 80 y.o. female with a hx of COPD, active tobacco use,  HTN, HLD, type 2 DM, breast cancer, RML lung mass suspecting for bronchogenic carcinoma, who is being seen today for the evaluation of chest pain, elevated troponin and pre-op risk evaluation for bronchoscopy  at the request of Dr. Posey Pronto.   History of Present Illness:   [name] has no known cardiac disease, had her first visit with Dr Harriet Masson in the office on 01/01/21 for pre-op evaluation for bronchoscopy. She had a recent Echo on 12/05/20 showed EF 60-65%, no RWMA, grade I DD, RV systolic function and size normal, mild MS. She was noted significant SOB using accessory muscle and hypoxic with pox 87% on room air, where she was transferred from the office to ER for further evaluation. At ED, she c/o right side chest pain, trop was negative and EKG non-ischemic. She had improved respiratory symptoms after IVF and bronchodilators, and later discharged home.   She was recently hospitalized here from 12/11/20 -12/14/20 for COPD exacerbation. CT chest 12/04/20 showed RML mass-like consolidation concern for malignancy. She was arranged for follow up with Dr Lamonte Sakai on 12/17/20 and she was recommended bronchoscopy under general anesthesia. Pre-op cardiac risk evaluation was recommended. She saw Dr Harriet Masson on 01/01/21 (as mentioned above), pre-op evaluation was not done as patient was in respiratory distress and she was sent to ER.   Patient presented to ER 01/10/21 with c/o failure to thrive, poor PO intake, chest pain intermittent for 2 month, and recurrent ER visit and hospitalization over  the past 2 months. She was found to be hyponatremic 131, hypokalemia 3.1, hyperglycemic 385, Cr elevated 1.51, albumin 2.7, and eGFR 35. Hs Trop 18 >19. CBC unremarkable. CXR showed stable RML mass unchanged, no acute process. Renal U/S no hydronephrosis.  She was admitted to hospital medicine for AKI, given IVF, electrolyte repletion. Cardiology is consulted for chest pain, elevated trop, and pre-op risk evaluation for bronchoscopy.   During encounter she was coughing frequently.  She initially could not remember having chest pain but then admitted to midsternal severe sharp CP.  She does not recall the episodes of right sided pain she had prior.  She has had cough and SOB.  She denies any radiation of her CP and no associated nausea or diaphoresis.  She has smoked 1ppd of cig for years.    Past Medical History:  Diagnosis Date  . Anxiety   . Breast cancer of upper-outer quadrant of left female breast ([place]) 08/18/2015  . Broken wrist    when younger x2  . COPD (chronic obstructive pulmonary disease) [facility])    Hospitalization March 2011  . Depression   . Diabetes mellitus without complication ([place])   . Dyspnea   . Full dentures   . H/O vaginal delivery 1962   x1   . High cholesterol   . History of home oxygen therapy   . Hypertension   . Pneumonia   . Radiation    11/18/15- 12/16/15 to her Left Breast    Past Surgical History:  Procedure Laterality Date  . ABDOMINAL HYSTERECTOMY    .  ABDOMINAL HYSTERECTOMY    . APPENDECTOMY    . CHOLECYSTECTOMY    . EYE SURGERY     cataracts  . RADIOACTIVE SEED GUIDED PARTIAL MASTECTOMY WITH AXILLARY SENTINEL LYMPH NODE BIOPSY Left 09/17/2015   Procedure: RADIOACTIVE SEED LOCALIZATION LEFT BREAST LUMPECTOMY AND LEFT AXILLARY SENTINEL LYMPH NODE BIOPSY;  Surgeon: Excell Seltzer, MD;  Location: [place];  Service: General;  Laterality: Left;  . TONSILLECTOMY       Home Medications:  Prior to Admission medications   Medication Sig Start Date End Date  Taking? Authorizing Provider  albuterol (VENTOLIN HFA) 108 (90 Base) MCG/ACT inhaler Inhale 2 puffs into the lungs every 2 (two) hours as needed for wheezing or shortness of breath. Please instruct in usage Patient taking differently: Inhale 2 puffs into the lungs every 2 (two) hours as needed for wheezing or shortness of breath. 12/13/20  Yes Samuella Cota, MD  anastrozole (ARIMIDEX) 1 MG tablet Take 1 mg by mouth daily. 12/23/20  Yes [provider]  aspirin EC 81 MG tablet Take 81 mg by mouth daily.   Yes [provider]  Cholecalciferol (VITAMIN D3) 5000 UNITS TABS Take 5,000 Units by mouth daily.   Yes [provider]  metFORMIN (GLUCOPHAGE-XR) 500 MG 24 hr tablet Take 1 tablet (500 mg total) by mouth 2 (two) times daily. Patient taking differently: Take 100 mg by mouth daily with breakfast. 12/06/20 03/06/21 Yes Dahal, Marlowe Aschoff, MD  pantoprazole (PROTONIX) 40 MG tablet Take 40 mg by mouth daily before breakfast. 09/24/20  Yes [provider]  pentoxifylline (TRENTAL) 400 MG CR tablet Take 1 tablet (400 mg total) by mouth 3 (three) times daily with meals. Patient taking differently: Take 800 mg by mouth in the morning. 06/03/20  Yes Nicholas Lose, MD  pioglitazone (ACTOS) 45 MG tablet Take 45 mg by mouth daily. 12/18/20  Yes [provider]  quinapril (ACCUPRIL) 40 MG tablet Take 40 mg by mouth daily.    Yes [provider]  rosuvastatin (CRESTOR) 40 MG tablet Take 40 mg by mouth daily.   Yes [provider]  verapamil (CALAN-SR) 240 MG CR tablet Take 240 mg by mouth daily. 06/10/15  Yes [provider]  Tiotropium Bromide-Olodaterol (STIOLTO RESPIMAT) 2.5-2.5 MCG/ACT AERS Inhale 2 puffs into the lungs daily. 12/17/20   Collene Gobble, MD    Inpatient Medications: Scheduled Meds: . anastrozole  1 mg Oral Daily  . arformoterol  15 mcg Nebulization BID  . aspirin EC  81 mg Oral Daily  . heparin  5,000 Units Subcutaneous Q8H  .  insulin aspart  0-15 Units Subcutaneous TID WC  . insulin aspart  0-5 Units Subcutaneous QHS  . nicotine  14 mg Transdermal Daily  . pantoprazole  40 mg Oral QAC breakfast  . pentoxifylline  400 mg Oral TID WC  . rosuvastatin  40 mg Oral Daily  . umeclidinium bromide  1 puff Inhalation Daily  . verapamil  240 mg Oral Daily   Continuous Infusions: . magnesium sulfate bolus IVPB     PRN Meds: acetaminophen **OR** acetaminophen, albuterol, ipratropium-albuterol, ondansetron **OR** ondansetron (ZOFRAN) IV, senna-docusate  Allergies:    Allergies  Allergen Reactions  . Ferumoxytol Shortness Of Breath, Itching and Other (See Comments)    Pt reported lightheadedness, a flushed feeling, SOB, and itching  . Pneumococcal Vaccines Hives, Swelling and Other (See Comments)    Arm became swollen  . Repaglinide Other (See Comments)    Dropped BGL too low  Social History:   Social History   Socioeconomic History  . Marital status: Legally Separated    Spouse name: Not on file  . Number of children: Not on file  . Years of education: Not on file  . Highest education level: Not on file  Occupational History  . Not on file  Tobacco Use  . Smoking status: Current Every Day Smoker    Packs/day: 1.00    Years: 70.00    Pack years: 70.00    Types: Cigarettes  . Smokeless tobacco: Never Used  . Tobacco comment: less then 1 pack smoked per day 12/17/20 [facility]   Vaping Use  . Vaping Use: Never used  Substance and Sexual Activity  . Alcohol use: Yes    Comment: rare  . Drug use: No  . Sexual activity: Not on file  Other Topics Concern  . Not on file  Social History Narrative  . Not on file   Social Determinants of Health   Financial Resource Strain: Not on file  Food Insecurity: Not on file  Transportation Needs: Not on file  Physical Activity: Not on file  Stress: Not on file  Social Connections: Not on file  Intimate Partner Violence: Not on file    Family History:    Family  History  Problem Relation Age of Onset  . Colon cancer Sister   . Lung cancer Sister      ROS:   Constitutional: generalized weakness Eyes: Denied vision change or loss Ears/Nose/Mouth/Throat: Denied ear ache, sore throat, coughing, sinus pain Cardiovascular: see HPI  Respiratory: see HPI  Gastrointestinal: Denied nausea, vomiting, abdominal pain, diarrhea Genital/Urinary: Denied dysuria, hematuria, urinary frequency/urgency Musculoskeletal: Denied muscle ache, joint pain Skin: Denied rash, wound Neuro: Denied headache, dizziness, syncope Psych: Denied history of depression/anxiety  Endocrine: history of diabetes      Physical Exam/Data:   Vitals:   01/11/21 0430 01/11/21 0503 01/11/21 0840 01/11/21 0920  BP: (!) 164/58 (!) 160/64  (!) 155/66  Pulse: 72 (!) 50  68  Resp:    18  Temp: 98.2 F (36.8 C) 97.9 F (36.6 C)  98 F (36.7 C)  TempSrc: Oral Oral  Oral  SpO2: 99% 96% 96% 98%    Intake/Output Summary (Last 24 hours) at 01/11/2021 1110 Last data filed at 01/11/2021 0900 Gross per 24 hour  Intake 1240 ml  Output 650 ml  Net 590 ml   Last 3 Weights 01/01/2021 01/01/2021 12/17/2020  Weight (lbs) 115 lb 115 lb 1.3 oz 122 lb 3.2 oz  Weight (kg) 52.164 kg 52.2 kg 55.43 kg     There is no height or weight on file to calculate BMI.   Physical exam per Dr Radford Pax addendum   EKG:  The EKG was personally reviewed and demonstrates:  Sinus rhythm with ventricular rate of 95 bpm, no acute ST-T changes.   Telemetry:  Telemetry was personally reviewed and demonstrates:  NSR  Relevant CV Studies:  Echo on 12/05/20:  1. Left ventricular ejection fraction, by estimation, is 60 to 65%. The  left ventricle has normal function. The left ventricle has no regional  wall motion abnormalities. Left ventricular diastolic parameters are  consistent with Grade I diastolic  dysfunction (impaired relaxation). Elevated left atrial pressure.  2. Right ventricular systolic function is  normal. The right ventricular  size is normal. Tricuspid regurgitation signal is inadequate for assessing  PA pressure.  3. The mitral valve is normal in structure. No evidence of mitral valve  regurgitation. Mild mitral stenosis. The mean mitral valve gradient is 3.5  mmHg. Moderate mitral annular calcification.  4. The aortic valve is normal in structure. Aortic valve regurgitation is  not visualized. Mild aortic valve sclerosis is present, with no evidence  of aortic valve stenosis.  5. The inferior vena cava is normal in size with greater than 50%  respiratory variability, suggesting right atrial pressure of 3 mmHg.    Laboratory Data:  High Sensitivity Troponin:   Recent Labs  Lab 12/25/20 2008 01/01/21 1633 01/01/21 1836 01/10/21 1551 01/10/21 1748  TROPONINIHS _0 18* 19*     Chemistry Recent Labs  Lab 01/10/21 1551 01/11/21 0305  NA 131* 136  K 3.0* 3.5  CL 93* 98  CO2 26 30  GLUCOSE 385* 126*  BUN 15 13  CREATININE 1.51* 1.45*  CALCIUM 9.1 8.7*  GFRNONAA 35* 37*  ANIONGAP 12 8    Recent Labs  Lab 01/10/21 1551 01/11/21 0305  PROT 5.8* 4.9*  ALBUMIN 2.7* 2.3*  AST 12* 12*  ALT 9 9  ALKPHOS 81 64  BILITOT 0.5 0.4   Hematology Recent Labs  Lab 01/10/21 1551 01/11/21 0305  WBC 8.9 7.8  RBC 4.70 4.27  HGB 12.5 11.4*  HCT 39.0 35.0*  MCV 83.0 82.0  MCH 26.6 26.7  MCHC 32.1 32.6  RDW 21.8* 21.8*  PLT 326 273   BNPNo results for input(s): BNP, PROBNP in the last 168 hours.  DDimer No results for input(s): DDIMER in the last 168 hours.   Radiology/Studies:  DG Chest 2 View  Result Date: 01/10/2021 CLINICAL DATA:  Chest pain, short of breath, right lung mass EXAM: CHEST - 2 VIEW COMPARISON:  01/02/2021, 01/01/2021 FINDINGS: Frontal and lateral views of the chest demonstrate an unremarkable cardiac silhouette. Stable right middle lobe mass consistent with likely bronchogenic malignancy based on previous CT findings. No new airspace  disease, effusion, or pneumothorax. No acute bony abnormalities. IMPRESSION: 1. Stable right middle lobe mass unchanged since previous CT, most consistent with lung cancer. 2. Otherwise no acute intrathoracic process. Electronically Signed   By: Randa Ngo M.D.   On: 01/10/2021 16:00   Abd 1 View (KUB)  Result Date: 01/10/2021 CLINICAL DATA:  Initial evaluation for acute kidney injury, lung mass. EXAM: ABDOMEN - 1 VIEW COMPARISON:  Prior MRI from 03/17/2020. FINDINGS: Bowel gas pattern within normal limits without obstruction or ileus. No appreciable abnormal bowel wall thickening. No visible free air on this limited supine view the abdomen. No soft tissue mass or abnormal calcification. Cholecystectomy clips overlie the right upper quadrant. Additional surgical clip present at the left hemipelvis. Degenerative spondylosis noted within the lower lumbar spine. No acute osseous finding. Visualized lung bases are grossly clear. IMPRESSION: Nonobstructive bowel gas pattern with no radiographic evidence for acute intra-abdominal pathology. Electronically Signed   By: Jeannine Boga M.D.   On: 01/10/2021 23:19   US RENAL  Result Date: 01/11/2021 CLINICAL DATA:  Acute kidney injury EXAM: RENAL / URINARY TRACT ULTRASOUND COMPLETE COMPARISON:  Ultrasound dated March 12, 2020.  MRI dated February 16, 2020 FINDINGS: Right Kidney: Renal measurements: 10.4.1 x 6.5 cm = volume: 150 mL. There is no hydronephrosis. The echogenicity is increased. There is a 1.5 cm hypoechoic lesion in the upper pole that is favored to represent a cyst. Left Kidney: Renal measurements: 12.1 x 3.7 x 4.3 cm = volume: 101.4 mL. The echogenicity is increased. There is no hydronephrosis. There is a prominent extrarenal pelvis. Again  noted is a cystic appearing lesion involving the interpolar region measuring approximately 1.6 cm Bladder: There is a questionable filling defect within the urinary bladder near the right UVJ. Other: None.  IMPRESSION: 1. No hydronephrosis. 2. Echogenic kidneys bilaterally which can be seen in patients with medical renal disease. 3. Questionable filling defect in the urinary bladder at the level of the right UVJ. Outpatient urology follow-up is recommended for this finding. Electronically Signed   By: Constance Holster M.D.   On: 01/11/2021 00:01     Assessment and Plan:   Chest pain Elevated troponin - c/o ongoing intermittent right sided chest pain over the past 2 month, associated with SOB - Hs Trop flat 18 >19 - EKG without acute evidence of ischemia - Echo from 12/05/20 shows normal LVF with mild MS - chest pain is atypical in that is is sharp but is mid sternal.  There is no radiation of her pain and no associated sx of Nausea or diaphoresis.  - she does have CRFs including ongoing tobacco use, HTN, DM and HLD.  - I suspect that she does have underlying CAD as chest CT recently showed coronary artery calcifications - since she needs to go under anesthesia and cannot walk without sx of DOE I recommend ischemic evaluation with Lexiscan Myoview prior to being placed under anesthesia for bronchoscopy - NPO after midnight   Cardiac risk assessment for non-cardiac procedure - patient has no known hx of MI, CHF, severe arrhythmia, + ongoing chest pain for 2 month - Echo from 12/05/20 showed EF 60-65%, no RWMA, grade I DD, RV systolic function and size normal, mild MS - Not able to perform 4 METs - Revised cardiac risk index is 1 (current chest pain), she is at 6% risk of peri-operative adverse cardiac events  - recommend ischemic evaluation before bronchoscopy   Acute kidney injury  - sCr baseline around 1, was 2 on 01/01/21 ER visit, 1.51 POA - Renal U/S without hydronephrosis  - likely combination of pre-renal AKI + metformin /quinapril  - agree with IVF hydration and avoid nephrotoxin  - monitor intake and output, trend renal index daily   RML lung mass - concerning for malignancy,  pending bronchoscopy for evaluation   COPD Active tobacco use - recommend smoking cessation - management per primary team   Type 2 DM - A1C 6.5% on 12/05/20, fairly controlled  - home oral agent held (metformin and Actos) -  management per primary team   HTN - BP is low normal at admission, now elevated  - Continue verapamil.   - agree holding quinapril in setting of AKI  HLD - on crestor, will update lipid panel   Failure to thrive  Decondition  - albumin 2.7 POA - consider nutrition consult   Hx of breast cancer  - management per primary team      Risk Assessment/Risk Scores:     HEAR Score (for undifferentiated chest pain):  HEAR Score: 5{     For questions or updates, please contact [place] Please consult [email or web address] for contact info under    Signed, Margie Billet, NP  01/11/2021 11:10 AM    Patient seen and independently examined with Margie Billet, NP. We discussed all aspects of the encounter. I agree with the assessment and plan as stated above.  Patient admitted with SOB and found to have lung mass.  She has had sx of SOB and cough for weeks and episodes of atpyical very shart CP on the right  and midsternal.    GEN: Well nourished, well developed in no acute distress HEENT: Normal NECK: No JVD; No carotid bruits LYMPHATICS: No lymphadenopathy CARDIAC:RRR, no murmurs, rubs, gallops RESPIRATORY scattered wheezes when coughing ABDOMEN: Soft, non-tender, non-distended MUSCULOSKELETAL:  No edema; No deformity  SKIN: Warm and dry NEUROLOGIC:  Alert and oriented x 3 PSYCHIATRIC:  Normal affect   Her EKG was personally reviewed by me and shows NSR with anterior infarct and nonspecific ST abnormality.    She has very atypical CP with trivial hsTrop elevation in setting of normal LVF on recent echo.  I suspect this is due to demand ischemia in the setting of hypertensive urgency, acute COPD exacerbation.  She clearly has CRFs with DM, HTN, HLD, ongoing  tobacco abuse.  She is unable to perform > 4 mets due to severe DOE.  Will make NPO after MN for lexiscan myoview in am to rule out ischemia.

## 2021-01-12 ENCOUNTER — Observation Stay (HOSPITAL_BASED_OUTPATIENT_CLINIC_OR_DEPARTMENT_OTHER): Payer: Medicare Other

## 2021-01-12 DIAGNOSIS — E1159 Type 2 diabetes mellitus with other circulatory complications: Secondary | ICD-10-CM | POA: Diagnosis not present

## 2021-01-12 DIAGNOSIS — R079 Chest pain, unspecified: Secondary | ICD-10-CM

## 2021-01-12 DIAGNOSIS — N179 Acute kidney failure, unspecified: Secondary | ICD-10-CM | POA: Diagnosis not present

## 2021-01-12 DIAGNOSIS — R778 Other specified abnormalities of plasma proteins: Secondary | ICD-10-CM | POA: Diagnosis not present

## 2021-01-12 DIAGNOSIS — E785 Hyperlipidemia, unspecified: Secondary | ICD-10-CM | POA: Diagnosis not present

## 2021-01-12 DIAGNOSIS — I152 Hypertension secondary to endocrine disorders: Secondary | ICD-10-CM | POA: Diagnosis not present

## 2021-01-12 DIAGNOSIS — Z01818 Encounter for other preprocedural examination: Secondary | ICD-10-CM | POA: Diagnosis not present

## 2021-01-12 DIAGNOSIS — E1169 Type 2 diabetes mellitus with other specified complication: Secondary | ICD-10-CM | POA: Diagnosis not present

## 2021-01-12 LAB — GLUCOSE, CAPILLARY
Glucose-Capillary: 189 mg/dL — ABNORMAL HIGH (ref 70–99)
Glucose-Capillary: 282 mg/dL — ABNORMAL HIGH (ref 70–99)
Glucose-Capillary: 287 mg/dL — ABNORMAL HIGH (ref 70–99)
Glucose-Capillary: 259 mg/dL — ABNORMAL HIGH (ref 70–99)

## 2021-01-12 LAB — LIPID PANEL
Cholesterol: 93 mg/dL (ref 0–200)
HDL: 35 mg/dL — ABNORMAL LOW (ref >40)
LDL Cholesterol: 37 mg/dL (ref 0–99)
Total CHOL/HDL Ratio: 2.7 RATIO
Triglycerides: 107 mg/dL (ref <150)
VLDL: 21 mg/dL (ref 0–40)

## 2021-01-12 LAB — BASIC METABOLIC PANEL
Anion gap: 9 (ref 5–15)
BUN: 11 mg/dL (ref 8–23)
CO2: 25 mmol/L (ref 22–32)
Calcium: 8.6 mg/dL — ABNORMAL LOW (ref 8.9–10.3)
Chloride: 98 mmol/L (ref 98–111)
Creatinine, Ser: 1.55 mg/dL — ABNORMAL HIGH (ref 0.44–1.00)
GFR, Estimated: 34 mL/min — ABNORMAL LOW (ref >60)
Glucose, Bld: 203 mg/dL — ABNORMAL HIGH (ref 70–99)
Potassium: 3.7 mmol/L (ref 3.5–5.1)
Sodium: 132 mmol/L — ABNORMAL LOW (ref 135–145)

## 2021-01-12 LAB — NM MYOCAR MULTI W/SPECT W/WALL MOTION / EF
Estimated workload: 1 METS
Exercise duration (min): 5 min
Exercise duration (sec): 14 s
MPHR: 142 {beats}/min
Peak HR: 100 {beats}/min
Percent HR: 70 %
Rest HR: 76 {beats}/min

## 2021-01-12 LAB — SARS CORONAVIRUS 2 (TAT 6-24 HRS): SARS Coronavirus 2: NEGATIVE

## 2021-01-12 MED ORDER — REGADENOSON 0.4 MG/5ML IV SOLN
INTRAVENOUS | Status: AC
  Administered 2021-01-12: 0.4 mg via INTRAVENOUS
  Filled 2021-01-12: qty 5

## 2021-01-12 MED ORDER — TECHNETIUM TC 99M TETROFOSMIN IV KIT
32.3000 | PACK | Freq: Once | INTRAVENOUS | Status: AC | PRN
  Administered 2021-01-12: 32.3 via INTRAVENOUS

## 2021-01-12 MED ORDER — REGADENOSON 0.4 MG/5ML IV SOLN
0.4000 mg | Freq: Once | INTRAVENOUS | Status: AC
  Filled 2021-01-12: qty 5

## 2021-01-12 MED ORDER — NITROGLYCERIN 0.4 MG SL SUBL
SUBLINGUAL_TABLET | SUBLINGUAL | Status: AC
  Filled 2021-01-12: qty 1

## 2021-01-12 MED ORDER — TECHNETIUM TC 99M TETROFOSMIN IV KIT
11.0000 | PACK | Freq: Once | INTRAVENOUS | Status: AC | PRN
  Administered 2021-01-12: 11 via INTRAVENOUS

## 2021-01-12 NOTE — NC FL2 (Signed)
[place] LEVEL OF CARE SCREENING TOOL     IDENTIFICATION  Patient Name: Dawn [name] Birthdate: 02/12/1941 Sex: female Admission Date (Current Location): 01/10/2021  [facility] and [place] Number:  Herbalist and Address:  [facility], [place] [address], [place], [place] [address]      Provider Number: [id number]  Attending Physician Name and Address:  Domenic Polite, MD  Relative Name and Phone Number:  [name], son, [phone]    Current Level of Care: Hospital Recommended Level of Care: [place] Prior Approval Number:    Date Approved/Denied:   PASRR Number: [id number]  Discharge Plan: SNF    Current Diagnoses: Patient Active Problem List   Diagnosis Date Noted  . Acute kidney injury ([place]) 01/10/2021  . Hypoxic 01/01/2021  . Shortness of breath 01/01/2021  . Mild mitral regurgitation 01/01/2021  . Pneumonia   . Hypertension   . History of home oxygen therapy   . High cholesterol   . Full dentures   . Diabetes mellitus without complication ([name])   . Depression   . Broken wrist   . Anxiety   . Generalized weakness 12/12/2020  . Cigar smoker 12/12/2020  . Aortic atherosclerosis ([place]) 12/12/2020  . Hypertension associated with diabetes ([place]) 12/11/2020  . Hyperlipidemia associated with type 2 diabetes mellitus ([place]) 12/11/2020  . Hypokalemia 12/11/2020  . Symptomatic anemia 12/04/2020  . Type 2 diabetes mellitus ([place]) 12/04/2020  . COPD (chronic obstructive pulmonary disease) ([place]) 12/04/2020  . Mass of right lung 12/04/2020  . Tobacco abuse 09/30/2015  . Breast cancer of upper-outer quadrant of left female breast ([place]) 08/18/2015  . Essential hypertension, benign 05/22/2014  . Mixed hyperlipidemia 05/22/2014    Orientation RESPIRATION BLADDER Height & Weight     Self,Time,Situation,Place  Normal Continent Weight:   Height:     BEHAVIORAL SYMPTOMS/MOOD NEUROLOGICAL BOWEL NUTRITION  STATUS      Continent Diet (See Dc summary)  AMBULATORY STATUS COMMUNICATION OF NEEDS Skin   Limited Assist Verbally Normal                       Personal Care Assistance Level of Assistance  Bathing,Feeding,Dressing Bathing Assistance: Maximum assistance Feeding assistance: Independent Dressing Assistance: Limited assistance     Functional Limitations Info  Sight,Hearing,Speech Sight Info: Adequate Hearing Info: Adequate Speech Info: Adequate    SPECIAL CARE FACTORS FREQUENCY  PT (By licensed PT),OT (By licensed OT)     PT Frequency: 5x week OT Frequency: 5x week            Contractures Contractures Info: Not present    Additional Factors Info  Code Status,Allergies,Insulin Sliding Scale Code Status Info: Full Allergies Info: Ferumoxytol   Pneumococcal Vaccines   Repaglinide   Insulin Sliding Scale Info: Insulin Aspart (Novolog) 0-15 U 3x daily w/ meals; 0-5 U @ bedtime       Current Medications (01/12/2021):  This is the current hospital active medication list Current Facility-Administered Medications  Medication Dose Route Frequency Provider Last Rate Last Admin  . acetaminophen (TYLENOL) tablet 650 mg  650 mg Oral Q6H PRN Lenore Cordia, MD       Or  . acetaminophen (TYLENOL) suppository 650 mg  650 mg Rectal Q6H PRN Zada Finders R, MD      . albuterol (VENTOLIN HFA) 108 (90 Base) MCG/ACT inhaler 2 puff  2 puff Inhalation Q2H PRN Lenore Cordia, MD      .  anastrozole (ARIMIDEX) tablet 1 mg  1 mg Oral Daily Zada Finders R, MD   1 mg at 01/11/21 0931  . arformoterol (BROVANA) nebulizer solution 15 mcg  15 mcg Nebulization BID Lenore Cordia, MD   15 mcg at 01/11/21 2058  . aspirin EC tablet 81 mg  81 mg Oral Daily Zada Finders R, MD   81 mg at 01/11/21 0908  . heparin injection 5,000 Units  5,000 Units Subcutaneous Q8H Lenore Cordia, MD   5,000 Units at 01/12/21 [phone]  . insulin aspart (novoLOG) injection 0-15 Units  0-15 Units Subcutaneous TID WC  Lenore Cordia, MD   3 Units at 01/11/21 1657  . insulin aspart (novoLOG) injection 0-5 Units  0-5 Units Subcutaneous QHS Lenore Cordia, MD   2 Units at 01/10/21 2346  . ipratropium-albuterol (DUONEB) 0.5-2.5 (3) MG/3ML nebulizer solution 3 mL  3 mL Nebulization Q4H PRN Zada Finders R, MD      . morphine 2 MG/ML injection 2 mg  2 mg Intravenous Q2H PRN Mansy, Jan A, MD   2 mg at 01/11/21 2109  . nicotine (NICODERM CQ - dosed in mg/24 hours) patch 14 mg  14 mg Transdermal Daily Domenic Polite, MD   14 mg at 01/11/21 1156  . nitroGLYCERIN (NITROSTAT) 0.4 MG SL tablet           . nitroGLYCERIN (NITROSTAT) SL tablet 0.4 mg  0.4 mg Sublingual Q5 min PRN Mansy, Jan A, MD   0.4 mg at 01/12/21 0958  . ondansetron (ZOFRAN) tablet 4 mg  4 mg Oral Q6H PRN Lenore Cordia, MD       Or  . ondansetron (ZOFRAN) injection 4 mg  4 mg Intravenous Q6H PRN Zada Finders R, MD      . pantoprazole (PROTONIX) EC tablet 40 mg  40 mg Oral QAC breakfast Lenore Cordia, MD   40 mg at 01/11/21 0908  . pentoxifylline (TRENTAL) CR tablet 400 mg  400 mg Oral TID WC Zada Finders R, MD   400 mg at 01/11/21 1636  . rosuvastatin (CRESTOR) tablet 40 mg  40 mg Oral Daily Lenore Cordia, MD   40 mg at 01/11/21 0908  . senna-docusate (Senokot-S) tablet 1 tablet  1 tablet Oral QHS PRN Zada Finders R, MD      . umeclidinium bromide (INCRUSE ELLIPTA) 62.5 MCG/INH 1 puff  1 puff Inhalation Daily Lenore Cordia, MD   1 puff at 01/11/21 0850  . verapamil (CALAN-SR) CR tablet 240 mg  240 mg Oral Daily Lenore Cordia, MD   240 mg at 01/11/21 9407     Discharge Medications: Please see discharge summary for a list of discharge medications.  Relevant Imaging Results:  Relevant Lab Results:   Additional Information SS# [place], LCSWA

## 2021-01-12 NOTE — Progress Notes (Signed)
Progress Note  Patient Name: Dawn [name] Date of Encounter: 01/12/2021  [facility] Cardiologist: Berniece Salines, DO   Subjective   Coughing much improved and only minimal sharp pain in chest  Inpatient Medications    Scheduled Meds: . anastrozole  1 mg Oral Daily  . arformoterol  15 mcg Nebulization BID  . aspirin EC  81 mg Oral Daily  . heparin  5,000 Units Subcutaneous Q8H  . insulin aspart  0-15 Units Subcutaneous TID WC  . insulin aspart  0-5 Units Subcutaneous QHS  . nicotine  14 mg Transdermal Daily  . pantoprazole  40 mg Oral QAC breakfast  . pentoxifylline  400 mg Oral TID WC  . rosuvastatin  40 mg Oral Daily  . umeclidinium bromide  1 puff Inhalation Daily  . verapamil  240 mg Oral Daily   Continuous Infusions:  PRN Meds: acetaminophen **OR** acetaminophen, albuterol, ipratropium-albuterol, morphine injection, nitroGLYCERIN, ondansetron **OR** ondansetron (ZOFRAN) IV, senna-docusate   Vital Signs    Vitals:   01/11/21 0920 01/11/21 1628 01/11/21 2033 01/12/21 0442  BP: (!) 155/66 (!) 148/55 (!) 114/54 129/62  Pulse: 68 75 66 77  Resp: 18 18 14 16   Temp: 98 F (36.7 C) 97.8 F (36.6 C) 98 F (36.7 C) 97.9 F (36.6 C)  TempSrc: Oral Oral Oral   SpO2: 98% 95% 96% 94%    Intake/Output Summary (Last 24 hours) at 01/12/2021 0759 Last data filed at 01/12/2021 0601 Gross per 24 hour  Intake 890 ml  Output 2400 ml  Net -1510 ml   Last 3 Weights 01/01/2021 01/01/2021 12/17/2020  Weight (lbs) 115 lb 115 lb 1.3 oz 122 lb 3.2 oz  Weight (kg) 52.164 kg 52.2 kg 55.43 kg      Telemetry    Not on tele- Personally Reviewed  ECG    No new EKG to review - Personally Reviewed  Physical Exam   GEN: No acute distress.   Neck: No JVD Cardiac: RRR, no murmurs, rubs, or gallops.  Respiratory: Clear to auscultation bilaterally. GI: Soft, nontender, non-distended  MS: No edema; No deformity. Neuro:  Nonfocal  Psych: Normal affect   Labs    High Sensitivity  Troponin:   Recent Labs  Lab 01/01/21 1633 01/01/21 1836 01/10/21 1551 01/10/21 1748 01/11/21 2135  TROPONINIHS 10 9 18* 19* 16      Chemistry Recent Labs  Lab 01/10/21 1551 01/11/21 0305 01/12/21 0626  NA 131* 136 132*  K 3.0* 3.5 3.7  CL 93* 98 98  CO2 26 30 25   GLUCOSE 385* 126* 203*  BUN 15 13 11   CREATININE 1.51* 1.45* 1.55*  CALCIUM 9.1 8.7* 8.6*  PROT 5.8* 4.9*  --   ALBUMIN 2.7* 2.3*  --   AST 12* 12*  --   ALT 9 9  --   ALKPHOS 81 64  --   BILITOT 0.5 0.4  --   GFRNONAA 35* 37* 34*  ANIONGAP 12 8 9      Hematology Recent Labs  Lab 01/10/21 1551 01/11/21 0305  WBC 8.9 7.8  RBC 4.70 4.27  HGB 12.5 11.4*  HCT 39.0 35.0*  MCV 83.0 82.0  MCH 26.6 26.7  MCHC 32.1 32.6  RDW 21.8* 21.8*  PLT 326 273    BNPNo results for input(s): BNP, PROBNP in the last 168 hours.   DDimer No results for input(s): DDIMER in the last 168 hours.   Radiology    DG Chest 2 View  Result Date: 01/10/2021  CLINICAL DATA:  Chest pain, short of breath, right lung mass EXAM: CHEST - 2 VIEW COMPARISON:  01/02/2021, 01/01/2021 FINDINGS: Frontal and lateral views of the chest demonstrate an unremarkable cardiac silhouette. Stable right middle lobe mass consistent with likely bronchogenic malignancy based on previous CT findings. No new airspace disease, effusion, or pneumothorax. No acute bony abnormalities. IMPRESSION: 1. Stable right middle lobe mass unchanged since previous CT, most consistent with lung cancer. 2. Otherwise no acute intrathoracic process. Electronically Signed   By: Randa Ngo M.D.   On: 01/10/2021 16:00   Abd 1 View (KUB)  Result Date: 01/10/2021 CLINICAL DATA:  Initial evaluation for acute kidney injury, lung mass. EXAM: ABDOMEN - 1 VIEW COMPARISON:  Prior MRI from 03/17/2020. FINDINGS: Bowel gas pattern within normal limits without obstruction or ileus. No appreciable abnormal bowel wall thickening. No visible free air on this limited supine view the  abdomen. No soft tissue mass or abnormal calcification. Cholecystectomy clips overlie the right upper quadrant. Additional surgical clip present at the left hemipelvis. Degenerative spondylosis noted within the lower lumbar spine. No acute osseous finding. Visualized lung bases are grossly clear. IMPRESSION: Nonobstructive bowel gas pattern with no radiographic evidence for acute intra-abdominal pathology. Electronically Signed   By: Jeannine Boga M.D.   On: 01/10/2021 23:19   US RENAL  Result Date: 01/11/2021 CLINICAL DATA:  Acute kidney injury EXAM: RENAL / URINARY TRACT ULTRASOUND COMPLETE COMPARISON:  Ultrasound dated March 12, 2020.  MRI dated February 16, 2020 FINDINGS: Right Kidney: Renal measurements: 10.4.1 x 6.5 cm = volume: 150 mL. There is no hydronephrosis. The echogenicity is increased. There is a 1.5 cm hypoechoic lesion in the upper pole that is favored to represent a cyst. Left Kidney: Renal measurements: 12.1 x 3.7 x 4.3 cm = volume: 101.4 mL. The echogenicity is increased. There is no hydronephrosis. There is a prominent extrarenal pelvis. Again noted is a cystic appearing lesion involving the interpolar region measuring approximately 1.6 cm Bladder: There is a questionable filling defect within the urinary bladder near the right UVJ. Other: None. IMPRESSION: 1. No hydronephrosis. 2. Echogenic kidneys bilaterally which can be seen in patients with medical renal disease. 3. Questionable filling defect in the urinary bladder at the level of the right UVJ. Outpatient urology follow-up is recommended for this finding. Electronically Signed   By: Constance Holster M.D.   On: 01/11/2021 00:01    Cardiac Studies   2D echo 11/2020 IMPRESSIONS   1. Left ventricular ejection fraction, by estimation, is 60 to 65%. The  left ventricle has normal function. The left ventricle has no regional  wall motion abnormalities. Left ventricular diastolic parameters are  consistent with Grade I diastolic   dysfunction (impaired relaxation). Elevated left atrial pressure.  2. Right ventricular systolic function is normal. The right ventricular  size is normal. Tricuspid regurgitation signal is inadequate for assessing  PA pressure.  3. The mitral valve is normal in structure. No evidence of mitral valve  regurgitation. Mild mitral stenosis. The mean mitral valve gradient is 3.5  mmHg. Moderate mitral annular calcification.  4. The aortic valve is normal in structure. Aortic valve regurgitation is  not visualized. Mild aortic valve sclerosis is present, with no evidence  of aortic valve stenosis.  5. The inferior vena cava is normal in size with greater than 50%  respiratory variability, suggesting right atrial pressure of 3 mmHg.   Patient Profile     80 y.o. female with a hx of COPD, active  tobacco use,  HTN, HLD, type 2 DM, breast cancer, RML lung mass suspecting for bronchogenic carcinoma, who is being seen today for the evaluation of chest pain, elevated troponin and pre-op risk evaluation for bronchoscopy  at the request of Dr. Posey Pronto.   Assessment & Plan    Chest pain Elevated troponin - c/o ongoing intermittent right sided chest pain over the past 2 month, associated with SOB - Hs Trop flat 18 >19 - EKG without acute evidence of ischemia - Echo from 12/05/20 shows normal LVF with mild MS - chest pain is atypical in that is is sharp but is mid sternal.  There is no radiation of her pain and no associated sx of Nausea or diaphoresis.  - she does have CRFs including ongoing tobacco use, HTN, DM and HLD.  - I suspect that she does have underlying CAD as chest CT recently showed coronary artery calcifications - since she needs to go under anesthesia and cannot walk without sx of DOE I recommend ischemic evaluation with Lexiscan Myoview prior to being placed under anesthesia for bronchoscopy - Plan for [employer] today  Cardiac risk assessment for non-cardiac procedure -  patient has no known hx of MI, CHF, severe arrhythmia, + ongoing chest pain for 2 month - Echo from 12/05/20 showed EF 60-65%, no RWMA, grade I DD, RV systolic function and size normal, mild MS - Not able to perform 4 METs - Revised cardiac risk index is 1 (current chest pain), she is at 6% risk of peri-operative adverse cardiac events  - recommend ischemic evaluation before bronchoscopy   Acute kidney injury  - sCr baseline around 1, was 2 on 01/01/21 ER visit, 1.51 POA - SCr continues to climb and 1.55 today - Renal U/S without hydronephrosis  - likely combination of pre-renal AKI + metformin /quinapril  - agree with IVF hydration and avoid nephrotoxin  - monitor intake and output, trend renal index daily   RML lung mass - concerning for malignancy, pending bronchoscopy for evaluation   COPD Active tobacco use - recommend smoking cessation - management per primary team   Type 2 DM - A1C 6.5% on 12/05/20, fairly controlled  - home oral agent held (metformin and Actos) -  management per primary team   HTN - BP is low normal at admission, now elevated  - Continue verapamil.  - agree holding quinapril in setting of AKI  HLD - on crestor, will update lipid panel   Failure to thrive  Decondition  - albumin 2.7 POA - consider nutrition consult   Hx of breast cancer  - management per primary team         For questions or updates, please contact [facility] Please consult [email or web address] for contact info under        Signed, Fransico Him, MD  01/12/2021, 7:59 AM

## 2021-01-12 NOTE — TOC Initial Note (Addendum)
Transition of Care [facility]) - Initial/Assessment Note    Patient Details  Name: Dawn [name] MRN: [id number] Date of Birth: 24-Apr-1941  Transition of Care [facility]) CM/SW Contact:    Coralee Pesa, [place] Phone Number: 01/12/2021, 11:14 AM  Clinical Narrative:                 Pt is agreeable to SNF, but has memory issues, so pt's son [name] has been a contact and is assisting her to make decisions. Son noted that pt has been living home alone. He is currently renovating his house so she can live with him, but these are not completed. Currently he sets up her medications for her and has set up caregivers, PT, and a meals on wheels like company to deliver her food. He notes that at this time he is unable to provide 24 hour assistance. He also notes that at her baseline, she was far more independent and now is requiring a lot of assistance. Son also noted that pt has had 2 covid vaccines, but is unsure about her booster.  Son is agreeable to SNF and faxout, he has no preferences, but would like to stay in the [place] areas. He was advised of the medicare. gov website, and noted he would look at the available options. CSW will start insurance, request covid, and prepare for pos DC tomorrow. SW will continue to follow for DC planning.  Auth ID: [id number] 5/3-5/5 CM Arist Dagout  Expected Discharge Plan: [place] Barriers to Discharge: Continued Medical Work up,Insurance Authorization,SNF Pending bed offer   Patient Goals and CMS Choice Patient states their goals for this hospitalization and ongoing recovery are:: Pt and son are both agreeable to SNF placement with the goal of returning home. CMS Medicare.gov Compare Post Acute Care list provided to:: Patient Choice offered to / list presented to : [place]  Expected Discharge Plan and Services Expected Discharge Plan: [place] Choice: [place] Living  arrangements for the past 2 months: Single Family Home                                      Prior Living Arrangements/Services Living arrangements for the past 2 months: Single Family Home Lives with:: Self Patient language and need for interpreter reviewed:: Yes Do you feel safe going back to the place where you live?: Yes      Need for Family Participation in Patient Care: Yes (Comment) Care giver support system in place?: Yes (comment) Current home services: Home PT,Meals on wheels Criminal Activity/Legal Involvement Pertinent to Current Situation/Hospitalization: No - Comment as needed  Activities of Daily Living      Permission Sought/Granted Permission sought to share information with : Family Supports Permission granted to share information with : Yes, Verbal Permission Granted  Share Information with NAME: [name]     Permission granted to share info w Relationship: Son  Permission granted to share info w Contact Information: [phone]  Emotional Assessment Appearance:: Appears stated age Attitude/Demeanor/Rapport: Gracious Affect (typically observed): Pleasant Orientation: : Oriented to Self,Oriented to Place,Oriented to  Time,Oriented to Situation Alcohol / Substance Use: Not Applicable Psych Involvement: No (comment)  Admission diagnosis:  Acute kidney injury ([place]) [N17.9] Abdominal pain, unspecified abdominal location [R10.9] Patient Active Problem List   Diagnosis Date Noted  . Acute kidney injury (  [place]) 01/10/2021  . Hypoxic 01/01/2021  . Shortness of breath 01/01/2021  . Mild mitral regurgitation 01/01/2021  . Pneumonia   . Hypertension   . History of home oxygen therapy   . High cholesterol   . Full dentures   . Diabetes mellitus without complication ([place])   . Depression   . Broken wrist   . Anxiety   . Generalized weakness 12/12/2020  . Cigar smoker 12/12/2020  . Aortic atherosclerosis ([place]) 12/12/2020  . Hypertension  associated with diabetes ([place]) 12/11/2020  . Hyperlipidemia associated with type 2 diabetes mellitus ([place]) 12/11/2020  . Hypokalemia 12/11/2020  . Symptomatic anemia 12/04/2020  . Type 2 diabetes mellitus ([place]) 12/04/2020  . COPD (chronic obstructive pulmonary disease) ([place]) 12/04/2020  . Mass of right lung 12/04/2020  . Tobacco abuse 09/30/2015  . Breast cancer of upper-outer quadrant of left female breast ([place]) 08/18/2015  . Essential hypertension, benign 05/22/2014  . Mixed hyperlipidemia 05/22/2014   PCP:  Lawerance Cruel, MD Pharmacy:   [facility] [address] [place] [address] Phone: [phone] Fax: [phone]     Social Determinants of Health (SDOH) Interventions    Readmission Risk Interventions No flowsheet data found.

## 2021-01-12 NOTE — Progress Notes (Signed)
   Myoview low risk with no evidence of ischemia or infarction. Per Dr. Radford Pax, [place] for discharge from a cardiac standpoint. Will notify primary team.  Darreld Mclean, PA-C 01/12/2021 5:32 PM

## 2021-01-12 NOTE — Progress Notes (Signed)
PROGRESS NOTE    DEVLIN MCVEIGH  YCX:[id number] DOB: January 06, 1941 DOA: 01/10/2021 PCP: Lawerance Cruel, MD  Brief Narrative 80 year old female with history of longstanding tobacco abuse, 2 packs/day, COPD, type 2 diabetes mellitus, hypertension, dyslipidemia, history of breast cancer with known right hilar lung mass concerning for bronchogenic carcinoma-pending cardiology evaluation for bronchoscopy and biopsy presented to the ER yesterday with generalized weakness, ongoing intermittent chest pressure with exertion and overall failure to thrive -Work-up in the ED was notable for creatinine of 1.5 which is higher than baseline less than 1, high-sensitivity troponin was in the high normal range, chest x-ray noted right middle lobe mass  Assessment & Plan:   Intermittent chest pain/pressure -Substernal, worse with exertion -EKG without acute findings -Has multiple cardiac risk factors and was due to see cardiology this week for clearance prior to bronchoscopy -Echocardiogram from 3/20 was unremarkable with preserved LVEF and wall motion -Appreciate cardiology input, plan for Myoview today  Acute kidney injury -Likely prerenal -Holding metformin and quinapril -Remains unchanged, renal ultrasound was unremarkable -Continue to trend  Right lung mass -Concerning for primary bronchogenic carcinoma -Being worked up by Dr. Lamonte Sakai, plan for bronchoscopy following cardiac clearance  Debility and memory loss -PT OT eval completed, SNF recommended  COPD Ongoing significant tobacco abuse -With intermittent wheezing, continue Brovana, duo nebs, albuterol PRN -Not requiring supplemental O2 at this time -Check ambulatory O2 sats prior to discharge  Type 2 diabetes mellitus -Actos and metformin on hold -Continue sliding scale insulin  Hypertension -Holding quinapril, continue verapamil  History of left breast cancer s/p lumpectomy and adjuvant radiation therapy 2017: Follows with oncology, Dr.  Lindi Adie.  Continue anastrozole and pentoxifylline.  Tobacco use: Still smoking up to 2 packs/day.  - Patient strongly advised on smoking cessation   DVT prophylaxis: Subcu heparin Code Status: Full code Family Communication: No family at bedside Disposition Plan:  Status is: Observation  The patient remains OBS appropriate and will d/c after cardiac eval  Dispo: The patient is from: Home              Anticipated d/c is to: SNF              Patient currently is not medically stable to d/c.   Difficult to place patient No  Consultants:   Cardiology   Procedures:   Antimicrobials:    Subjective: -Feels okay, did have 1 episode of chest discomfort earlier today  Objective: Vitals:   01/12/21 0933 01/12/21 0952 01/12/21 0954 01/12/21 0956  BP: (!) 153/69 (!) 143/56 (!) 136/52 (!) 133/50  Pulse: 78 100 (!) 102 94  Resp:      Temp:      TempSrc:      SpO2:        Intake/Output Summary (Last 24 hours) at 01/12/2021 1134 Last data filed at 01/12/2021 0730 Gross per 24 hour  Intake 530 ml  Output 2150 ml  Net -1620 ml   There were no vitals filed for this visit.  Examination:  General exam: Chronically ill elderly female, sitting up in bed, awake alert oriented to self and place only, very forgetful, no distress CVS: S1-S2, regular rate rhythm Lungs: Poor air movement bilaterally Abdomen: Soft, nontender, bowel sounds present Extremities: No edema  Skin: No rashes on exposed skin  Psychiatry: Mood & affect appropriate.     Data Reviewed:   CBC: Recent Labs  Lab 01/10/21 1551 01/11/21 0305  WBC 8.9 7.8  HGB 12.5 11.4*  HCT 39.0  35.0*  MCV 83.0 82.0  PLT 326 093   Basic Metabolic Panel: Recent Labs  Lab 01/10/21 1551 01/11/21 0305 01/12/21 0626  NA 131* 136 132*  K 3.0* 3.5 3.7  CL 93* 98 98  CO2 26 30 25   GLUCOSE 385* 126* 203*  BUN 15 13 11   CREATININE 1.51* 1.45* 1.55*  CALCIUM 9.1 8.7* 8.6*  MG  --  1.5*  --    GFR: Estimated  Creatinine Clearance: 24.7 mL/min (A) (by C-G formula based on SCr of 1.55 mg/dL (H)). Liver Function Tests: Recent Labs  Lab 01/10/21 1551 01/11/21 0305  AST 12* 12*  ALT 9 9  ALKPHOS 81 64  BILITOT 0.5 0.4  PROT 5.8* 4.9*  ALBUMIN 2.7* 2.3*   Recent Labs  Lab 01/10/21 1551  LIPASE 32   No results for input(s): AMMONIA in the last 168 hours. Coagulation Profile: No results for input(s): INR, PROTIME in the last 168 hours. Cardiac Enzymes: No results for input(s): CKTOTAL, CKMB, CKMBINDEX, TROPONINI in the last 168 hours. BNP (last 3 results) No results for input(s): PROBNP in the last 8760 hours. HbA1C: No results for input(s): HGBA1C in the last 72 hours. CBG: Recent Labs  Lab 01/11/21 0656 01/11/21 1200 01/11/21 1656 01/11/21 2034 01/12/21 0637  GLUCAP 186* 220* 193* 171* 189*   Lipid Profile: Recent Labs    01/12/21 0626  CHOL 93  HDL 35*  LDLCALC 37  TRIG 107  CHOLHDL 2.7   Thyroid Function Tests: No results for input(s): TSH, T4TOTAL, FREET4, T3FREE, THYROIDAB in the last 72 hours. Anemia Panel: No results for input(s): VITAMINB12, FOLATE, FERRITIN, TIBC, IRON, RETICCTPCT in the last 72 hours. Urine analysis:    Component Value Date/Time   COLORURINE YELLOW 11/25/2009 1109   APPEARANCEUR CLEAR 11/25/2009 1109   LABSPEC 1.015 11/25/2009 1109   PHURINE 6.0 11/25/2009 1109   GLUCOSEU NEGATIVE 11/25/2009 1109   HGBUR SMALL (A) 11/25/2009 1109   BILIRUBINUR NEGATIVE 11/25/2009 1109   KETONESUR NEGATIVE 11/25/2009 1109   PROTEINUR 100 (A) 11/25/2009 1109   UROBILINOGEN 0.2 11/25/2009 1109   NITRITE NEGATIVE 11/25/2009 1109   LEUKOCYTESUR NEGATIVE 11/25/2009 1109   Sepsis Labs: @LABRCNTIP (procalcitonin:4,lacticidven:4)  ) Recent Results (from the past 240 hour(s))  SARS CORONAVIRUS 2 (TAT 6-24 HRS) Nasopharyngeal Nasopharyngeal Swab     Status: None   Collection Time: 01/10/21  9:46 PM   Specimen: Nasopharyngeal Swab  Result Value Ref Range  Status   SARS Coronavirus 2 NEGATIVE NEGATIVE Final    Comment: (NOTE) SARS-CoV-2 target nucleic acids are NOT DETECTED.  The SARS-CoV-2 RNA is generally detectable in upper and lower respiratory specimens during the acute phase of infection. Negative results do not preclude SARS-CoV-2 infection, do not rule out co-infections with other pathogens, and should not be used as the sole basis for treatment or other patient management decisions. Negative results must be combined with clinical observations, patient history, and epidemiological information. The expected result is Negative.  Fact Sheet for Patients: SugarRoll.be  Fact Sheet for Healthcare Providers: https://www.woods-mathews.com/  This test is not yet approved or cleared by the [place] FDA and  has been authorized for detection and/or diagnosis of SARS-CoV-2 by FDA under an Emergency Use Authorization (EUA). This EUA will remain  in effect (meaning this test can be used) for the duration of the COVID-19 declaration under Se ction 564(b)(1) of the Act, 21 U.S.C. section 360bbb-3(b)(1), unless the authorization is terminated or revoked sooner.  Performed at [facility],  [address], [place], [place] [address]      Radiology Studies: DG Chest 2 View  Result Date: 01/10/2021 CLINICAL DATA:  Chest pain, short of breath, right lung mass EXAM: CHEST - 2 VIEW COMPARISON:  01/02/2021, 01/01/2021 FINDINGS: Frontal and lateral views of the chest demonstrate an unremarkable cardiac silhouette. Stable right middle lobe mass consistent with likely bronchogenic malignancy based on previous CT findings. No new airspace disease, effusion, or pneumothorax. No acute bony abnormalities. IMPRESSION: 1. Stable right middle lobe mass unchanged since previous CT, most consistent with lung cancer. 2. Otherwise no acute intrathoracic process. Electronically Signed   By: Randa Ngo M.D.    On: 01/10/2021 16:00   Abd 1 View (KUB)  Result Date: 01/10/2021 CLINICAL DATA:  Initial evaluation for acute kidney injury, lung mass. EXAM: ABDOMEN - 1 VIEW COMPARISON:  Prior MRI from 03/17/2020. FINDINGS: Bowel gas pattern within normal limits without obstruction or ileus. No appreciable abnormal bowel wall thickening. No visible free air on this limited supine view the abdomen. No soft tissue mass or abnormal calcification. Cholecystectomy clips overlie the right upper quadrant. Additional surgical clip present at the left hemipelvis. Degenerative spondylosis noted within the lower lumbar spine. No acute osseous finding. Visualized lung bases are grossly clear. IMPRESSION: Nonobstructive bowel gas pattern with no radiographic evidence for acute intra-abdominal pathology. Electronically Signed   By: Jeannine Boga M.D.   On: 01/10/2021 23:19   US RENAL  Result Date: 01/11/2021 CLINICAL DATA:  Acute kidney injury EXAM: RENAL / URINARY TRACT ULTRASOUND COMPLETE COMPARISON:  Ultrasound dated March 12, 2020.  MRI dated February 16, 2020 FINDINGS: Right Kidney: Renal measurements: 10.4.1 x 6.5 cm = volume: 150 mL. There is no hydronephrosis. The echogenicity is increased. There is a 1.5 cm hypoechoic lesion in the upper pole that is favored to represent a cyst. Left Kidney: Renal measurements: 12.1 x 3.7 x 4.3 cm = volume: 101.4 mL. The echogenicity is increased. There is no hydronephrosis. There is a prominent extrarenal pelvis. Again noted is a cystic appearing lesion involving the interpolar region measuring approximately 1.6 cm Bladder: There is a questionable filling defect within the urinary bladder near the right UVJ. Other: None. IMPRESSION: 1. No hydronephrosis. 2. Echogenic kidneys bilaterally which can be seen in patients with medical renal disease. 3. Questionable filling defect in the urinary bladder at the level of the right UVJ. Outpatient urology follow-up is recommended for this finding.  Electronically Signed   By: Constance Holster M.D.   On: 01/11/2021 00:01    Scheduled Meds: . anastrozole  1 mg Oral Daily  . arformoterol  15 mcg Nebulization BID  . aspirin EC  81 mg Oral Daily  . heparin  5,000 Units Subcutaneous Q8H  . insulin aspart  0-15 Units Subcutaneous TID WC  . insulin aspart  0-5 Units Subcutaneous QHS  . nicotine  14 mg Transdermal Daily  . nitroGLYCERIN      . pantoprazole  40 mg Oral QAC breakfast  . pentoxifylline  400 mg Oral TID WC  . rosuvastatin  40 mg Oral Daily  . umeclidinium bromide  1 puff Inhalation Daily  . verapamil  240 mg Oral Daily   Continuous Infusions:   LOS: 0 days    Time spent: 75min  Domenic Polite, MD [facility] 01/12/2021, 11:34 AM

## 2021-01-13 ENCOUNTER — Other Ambulatory Visit (HOSPITAL_COMMUNITY): Payer: Self-pay

## 2021-01-13 DIAGNOSIS — R5383 Other fatigue: Secondary | ICD-10-CM | POA: Diagnosis not present

## 2021-01-13 DIAGNOSIS — R630 Anorexia: Secondary | ICD-10-CM | POA: Diagnosis not present

## 2021-01-13 DIAGNOSIS — C781 Secondary malignant neoplasm of mediastinum: Secondary | ICD-10-CM | POA: Diagnosis not present

## 2021-01-13 DIAGNOSIS — D381 Neoplasm of uncertain behavior of trachea, bronchus and lung: Secondary | ICD-10-CM | POA: Diagnosis not present

## 2021-01-13 DIAGNOSIS — Z7982 Long term (current) use of aspirin: Secondary | ICD-10-CM | POA: Diagnosis not present

## 2021-01-13 DIAGNOSIS — M2550 Pain in unspecified joint: Secondary | ICD-10-CM | POA: Diagnosis not present

## 2021-01-13 DIAGNOSIS — R0789 Other chest pain: Secondary | ICD-10-CM | POA: Diagnosis not present

## 2021-01-13 DIAGNOSIS — Z853 Personal history of malignant neoplasm of breast: Secondary | ICD-10-CM | POA: Diagnosis not present

## 2021-01-13 DIAGNOSIS — R9389 Abnormal findings on diagnostic imaging of other specified body structures: Secondary | ICD-10-CM | POA: Diagnosis not present

## 2021-01-13 DIAGNOSIS — Z800 Family history of malignant neoplasm of digestive organs: Secondary | ICD-10-CM | POA: Diagnosis not present

## 2021-01-13 DIAGNOSIS — R2681 Unsteadiness on feet: Secondary | ICD-10-CM | POA: Diagnosis not present

## 2021-01-13 DIAGNOSIS — Z720 Tobacco use: Secondary | ICD-10-CM | POA: Diagnosis not present

## 2021-01-13 DIAGNOSIS — R627 Adult failure to thrive: Secondary | ICD-10-CM | POA: Diagnosis not present

## 2021-01-13 DIAGNOSIS — E1169 Type 2 diabetes mellitus with other specified complication: Secondary | ICD-10-CM | POA: Diagnosis not present

## 2021-01-13 DIAGNOSIS — C342 Malignant neoplasm of middle lobe, bronchus or lung: Secondary | ICD-10-CM | POA: Diagnosis not present

## 2021-01-13 DIAGNOSIS — Z170 Estrogen receptor positive status [ER+]: Secondary | ICD-10-CM | POA: Diagnosis not present

## 2021-01-13 DIAGNOSIS — R1013 Epigastric pain: Secondary | ICD-10-CM | POA: Diagnosis not present

## 2021-01-13 DIAGNOSIS — R0600 Dyspnea, unspecified: Secondary | ICD-10-CM | POA: Diagnosis not present

## 2021-01-13 DIAGNOSIS — C50412 Malignant neoplasm of upper-outer quadrant of left female breast: Secondary | ICD-10-CM | POA: Diagnosis not present

## 2021-01-13 DIAGNOSIS — F17210 Nicotine dependence, cigarettes, uncomplicated: Secondary | ICD-10-CM | POA: Diagnosis not present

## 2021-01-13 DIAGNOSIS — R6889 Other general symptoms and signs: Secondary | ICD-10-CM | POA: Diagnosis not present

## 2021-01-13 DIAGNOSIS — R079 Chest pain, unspecified: Secondary | ICD-10-CM | POA: Diagnosis not present

## 2021-01-13 DIAGNOSIS — R279 Unspecified lack of coordination: Secondary | ICD-10-CM | POA: Diagnosis not present

## 2021-01-13 DIAGNOSIS — R911 Solitary pulmonary nodule: Secondary | ICD-10-CM | POA: Diagnosis not present

## 2021-01-13 DIAGNOSIS — E119 Type 2 diabetes mellitus without complications: Secondary | ICD-10-CM | POA: Diagnosis not present

## 2021-01-13 DIAGNOSIS — Z9049 Acquired absence of other specified parts of digestive tract: Secondary | ICD-10-CM | POA: Diagnosis not present

## 2021-01-13 DIAGNOSIS — Z03818 Encounter for observation for suspected exposure to other biological agents ruled out: Secondary | ICD-10-CM | POA: Diagnosis not present

## 2021-01-13 DIAGNOSIS — Z801 Family history of malignant neoplasm of trachea, bronchus and lung: Secondary | ICD-10-CM | POA: Diagnosis not present

## 2021-01-13 DIAGNOSIS — N179 Acute kidney failure, unspecified: Secondary | ICD-10-CM | POA: Diagnosis not present

## 2021-01-13 DIAGNOSIS — I10 Essential (primary) hypertension: Secondary | ICD-10-CM | POA: Diagnosis not present

## 2021-01-13 DIAGNOSIS — R1312 Dysphagia, oropharyngeal phase: Secondary | ICD-10-CM | POA: Diagnosis not present

## 2021-01-13 DIAGNOSIS — Z7401 Bed confinement status: Secondary | ICD-10-CM | POA: Diagnosis not present

## 2021-01-13 DIAGNOSIS — I517 Cardiomegaly: Secondary | ICD-10-CM | POA: Diagnosis not present

## 2021-01-13 DIAGNOSIS — I152 Hypertension secondary to endocrine disorders: Secondary | ICD-10-CM | POA: Diagnosis not present

## 2021-01-13 DIAGNOSIS — E1136 Type 2 diabetes mellitus with diabetic cataract: Secondary | ICD-10-CM | POA: Diagnosis not present

## 2021-01-13 DIAGNOSIS — J069 Acute upper respiratory infection, unspecified: Secondary | ICD-10-CM | POA: Diagnosis not present

## 2021-01-13 DIAGNOSIS — F0390 Unspecified dementia without behavioral disturbance: Secondary | ICD-10-CM | POA: Diagnosis not present

## 2021-01-13 DIAGNOSIS — E1165 Type 2 diabetes mellitus with hyperglycemia: Secondary | ICD-10-CM | POA: Diagnosis not present

## 2021-01-13 DIAGNOSIS — F17200 Nicotine dependence, unspecified, uncomplicated: Secondary | ICD-10-CM | POA: Diagnosis not present

## 2021-01-13 DIAGNOSIS — R918 Other nonspecific abnormal finding of lung field: Secondary | ICD-10-CM | POA: Diagnosis not present

## 2021-01-13 DIAGNOSIS — R059 Cough, unspecified: Secondary | ICD-10-CM | POA: Diagnosis not present

## 2021-01-13 DIAGNOSIS — Z79899 Other long term (current) drug therapy: Secondary | ICD-10-CM | POA: Diagnosis not present

## 2021-01-13 DIAGNOSIS — Z20822 Contact with and (suspected) exposure to covid-19: Secondary | ICD-10-CM | POA: Diagnosis not present

## 2021-01-13 DIAGNOSIS — E785 Hyperlipidemia, unspecified: Secondary | ICD-10-CM | POA: Diagnosis not present

## 2021-01-13 DIAGNOSIS — Z888 Allergy status to other drugs, medicaments and biological substances status: Secondary | ICD-10-CM | POA: Diagnosis not present

## 2021-01-13 DIAGNOSIS — U071 COVID-19: Secondary | ICD-10-CM | POA: Diagnosis not present

## 2021-01-13 DIAGNOSIS — Z538 Procedure and treatment not carried out for other reasons: Secondary | ICD-10-CM | POA: Diagnosis not present

## 2021-01-13 DIAGNOSIS — Z887 Allergy status to serum and vaccine status: Secondary | ICD-10-CM | POA: Diagnosis not present

## 2021-01-13 DIAGNOSIS — E7800 Pure hypercholesterolemia, unspecified: Secondary | ICD-10-CM | POA: Diagnosis not present

## 2021-01-13 DIAGNOSIS — Z7984 Long term (current) use of oral hypoglycemic drugs: Secondary | ICD-10-CM | POA: Diagnosis not present

## 2021-01-13 DIAGNOSIS — Z9012 Acquired absence of left breast and nipple: Secondary | ICD-10-CM | POA: Diagnosis not present

## 2021-01-13 DIAGNOSIS — M6281 Muscle weakness (generalized): Secondary | ICD-10-CM | POA: Diagnosis not present

## 2021-01-13 DIAGNOSIS — Z90710 Acquired absence of both cervix and uterus: Secondary | ICD-10-CM | POA: Diagnosis not present

## 2021-01-13 DIAGNOSIS — J90 Pleural effusion, not elsewhere classified: Secondary | ICD-10-CM | POA: Diagnosis not present

## 2021-01-13 DIAGNOSIS — Z743 Need for continuous supervision: Secondary | ICD-10-CM | POA: Diagnosis not present

## 2021-01-13 DIAGNOSIS — J449 Chronic obstructive pulmonary disease, unspecified: Secondary | ICD-10-CM | POA: Diagnosis not present

## 2021-01-13 DIAGNOSIS — R590 Localized enlarged lymph nodes: Secondary | ICD-10-CM | POA: Diagnosis not present

## 2021-01-13 DIAGNOSIS — J439 Emphysema, unspecified: Secondary | ICD-10-CM | POA: Diagnosis not present

## 2021-01-13 LAB — URINALYSIS, ROUTINE W REFLEX MICROSCOPIC
Bilirubin Urine: NEGATIVE
Glucose, UA: >=500 mg/dL — AB
Ketones, ur: 5 mg/dL — AB
Nitrite: NEGATIVE
Protein, ur: 30 mg/dL — AB
Specific Gravity, Urine: 1.003 — ABNORMAL LOW (ref 1.005–1.030)
pH: 6 (ref 5.0–8.0)

## 2021-01-13 LAB — GLUCOSE, CAPILLARY
Glucose-Capillary: 219 mg/dL — ABNORMAL HIGH (ref 70–99)
Glucose-Capillary: 217 mg/dL — ABNORMAL HIGH (ref 70–99)
Glucose-Capillary: 301 mg/dL — ABNORMAL HIGH (ref 70–99)

## 2021-01-13 MED ORDER — NICOTINE 14 MG/24HR TD PT24
14.0000 mg | MEDICATED_PATCH | Freq: Every day | TRANSDERMAL | 0 refills | Status: DC
  Filled 2021-01-13: qty 28, 28d supply, fill #0

## 2021-01-13 NOTE — Progress Notes (Signed)
[name] to be discharged [place] per MD order. Patient verbalized understanding.  Skin clean, dry and intact without evidence of skin break down, no evidence of skin tears noted. IV catheter discontinued intact. Site without signs and symptoms of complications. Dressing and pressure applied. Pt denies pain at the site currently. No complaints noted.  Patient free of lines, drains, and wounds.   Discharge packet assembled. Report called to Tillie Rung (nurse) accepting facility; all questions and concerns addressed. Waiting on [facility] to pick the patient.  Amaryllis Dyke, RN

## 2021-01-13 NOTE — Progress Notes (Signed)
Patient discharged to [facility]. [facility] here to pick patient patient up.

## 2021-01-13 NOTE — Discharge Summary (Signed)
Physician Discharge Summary  Dawn [name] SNK:[id number] DOB: 02-14-1941 DOA: 01/10/2021  PCP: Lawerance Cruel, MD  Admit date: 01/10/2021 Discharge date: 01/13/2021  Time spent: 35 minutes  Recommendations for Outpatient Follow-up:  1. Pulmonary Dr. Baltazar Apo in 1 week for bronchoscopy and biopsy 2. PCP in 1 week   Discharge Diagnoses:  Principal Problem:   Acute kidney injury ([place]) Chest pain Right lung mass concerning for bronchogenic CA   Tobacco abuse   Type 2 diabetes mellitus (HCC)   COPD (chronic obstructive pulmonary disease) (HCC)   Mass of right lung   Hypertension associated with diabetes ([place])   Hyperlipidemia associated with type 2 diabetes mellitus ([place])   Hypokalemia   Discharge Condition: Stable  Diet recommendation: Low-sodium, diabetic  There were no vitals filed for this visit.  History of present illness:   80 year old female with history of longstanding tobacco abuse, 2 packs/day, COPD, type 2 diabetes mellitus, hypertension, dyslipidemia, history of breast cancer with known right hilar lung mass concerning for bronchogenic carcinoma-pending cardiology evaluation for bronchoscopy and biopsy presented to the ER yesterday with generalized weakness, ongoing intermittent chest pressure with exertion and overall failure to thrive -Work-up in the ED was notable for creatinine of 1.5 which is higher than baseline less than 1, high-sensitivity troponin was in the high normal range, chest x-ray noted right middle lobe mass  Hospital Course:   Intermittent chest pain/pressure -Substernal, worse with exertion -EKG without acute findings, echocardiogram in March was unremarkable with preserved LVEF and wall motion -Has multiple cardiac risk factors, cardiology was consulted, underwent Lexiscan Myoview which was low risk, negative for ischemia or infarction -No further cardiac work-up recommended at this time -Due to overall debility and memory/cognitive  deficits she will be discharged to SNF for short-term rehab  Acute kidney injury -Likely prerenal -Creatinine remains in the 1.5 range, quinapril discontinued -Remains unchanged, renal ultrasound was unremarkable -Recheck in 1 week  Right lung mass -Concerning for primary bronchogenic carcinoma -Being worked up by Dr. Lamonte Sakai, plan for bronchoscopy in the near future  Debility and memory loss -PT OT eval completed, SNF recommended  COPD Ongoing significant tobacco abuse -With intermittent wheezing, continue Brovana, duo nebs, albuterol PRN -Not requiring supplemental O2 at this time  Type 2 diabetes mellitus -Actos and metformin resumed  Hypertension -Discontinued quinapril, continue verapamil  History of left breast cancer s/p lumpectomy and adjuvant radiation therapy 2017: Follows with oncology, Dr. Lindi Adie. Continue anastrozole and pentoxifylline.  Tobacco use: Still smoking up to 2 packs/day.  -Patient strongly advised on smoking cessation     Procedures:  Lexiscan Myoview: Low risk without evidence of ischemia or infarction  Consultations:  Cardiology  Discharge Exam: Vitals:   01/13/21 0319 01/13/21 0937  BP: (!) 135/50 (!) 116/47  Pulse: 71 75  Resp: 16 16  Temp: 97.9 F (36.6 C) 98.2 F (36.8 C)  SpO2: 95% 93%    General: Awake alert oriented x2, memory deficits noted Cardiovascular: S1-S2, regular rate rhythm Respiratory: Poor air movement bilaterally, otherwise clear  Discharge Instructions   Discharge Instructions    Diet - low sodium heart healthy   Complete by: As directed    Diet Carb Modified   Complete by: As directed    Increase activity slowly   Complete by: As directed      Allergies as of 01/13/2021      Reactions   Ferumoxytol Shortness Of Breath, Itching, Other (See Comments)   Pt reported lightheadedness, a flushed feeling, SOB,  and itching   Pneumococcal Vaccines Hives, Swelling, Other (See Comments)   Arm  became swollen   Repaglinide Other (See Comments)   Dropped BGL too low      Medication List    STOP taking these medications   quinapril 40 MG tablet Commonly known as: ACCUPRIL     TAKE these medications   albuterol 108 (90 Base) MCG/ACT inhaler Commonly known as: VENTOLIN HFA Inhale 2 puffs into the lungs every 2 (two) hours as needed for wheezing or shortness of breath. Please instruct in usage What changed: additional instructions   anastrozole 1 MG tablet Commonly known as: ARIMIDEX Take 1 mg by mouth daily.   aspirin EC 81 MG tablet Take 81 mg by mouth daily.   metFORMIN 500 MG 24 hr tablet Commonly known as: GLUCOPHAGE-XR Take 1 tablet (500 mg total) by mouth 2 (two) times daily. What changed:   how much to take  when to take this   nicotine 14 mg/24hr patch Commonly known as: NICODERM CQ - dosed in mg/24 hours Place 1 patch (14 mg total) onto the skin daily. Start taking on: Jan 14, 2021   pantoprazole 40 MG tablet Commonly known as: PROTONIX Take 40 mg by mouth daily before breakfast.   pentoxifylline 400 MG CR tablet Commonly known as: TRENTAL Take 1 tablet (400 mg total) by mouth 3 (three) times daily with meals. What changed:   how much to take  when to take this   pioglitazone 45 MG tablet Commonly known as: ACTOS Take 45 mg by mouth daily.   rosuvastatin 40 MG tablet Commonly known as: CRESTOR Take 40 mg by mouth daily.   Stiolto Respimat 2.5-2.5 MCG/ACT Aers Generic drug: Tiotropium Bromide-Olodaterol Inhale 2 puffs into the lungs daily.   verapamil 240 MG CR tablet Commonly known as: CALAN-SR Take 240 mg by mouth daily.   Vitamin D3 125 MCG (5000 UT) Tabs Take 5,000 Units by mouth daily.      Allergies  Allergen Reactions  . Ferumoxytol Shortness Of Breath, Itching and Other (See Comments)    Pt reported lightheadedness, a flushed feeling, SOB, and itching  . Pneumococcal Vaccines Hives, Swelling and Other (See Comments)     Arm became swollen  . Repaglinide Other (See Comments)    Dropped BGL too low    Contact information for follow-up providers    Lawerance Cruel, MD. Schedule an appointment as soon as possible for a visit in 1 week(s).   Specialty: Family Medicine Contact information: [address] [place] [address] [phone]            Contact information for after-discharge care    Destination    [facility] Preferred SNF .   Service: Skilled Nursing Contact information: [address] [place] [phone]                   The results of significant diagnostics from this hospitalization (including imaging, microbiology, ancillary and laboratory) are listed below for reference.    Significant Diagnostic Studies: DG Chest 2 View  Result Date: 01/10/2021 CLINICAL DATA:  Chest pain, short of breath, right lung mass EXAM: CHEST - 2 VIEW COMPARISON:  01/02/2021, 01/01/2021 FINDINGS: Frontal and lateral views of the chest demonstrate an unremarkable cardiac silhouette. Stable right middle lobe mass consistent with likely bronchogenic malignancy based on previous CT findings. No new airspace disease, effusion, or pneumothorax. No acute bony abnormalities. IMPRESSION: 1. Stable right  middle lobe mass unchanged since previous CT, most consistent with lung cancer. 2. Otherwise no acute intrathoracic process. Electronically Signed   By: Randa Ngo M.D.   On: 01/10/2021 16:00   Abd 1 View (KUB)  Result Date: 01/10/2021 CLINICAL DATA:  Initial evaluation for acute kidney injury, lung mass. EXAM: ABDOMEN - 1 VIEW COMPARISON:  Prior MRI from 03/17/2020. FINDINGS: Bowel gas pattern within normal limits without obstruction or ileus. No appreciable abnormal bowel wall thickening. No visible free air on this limited supine view the abdomen. No soft tissue mass or abnormal calcification. Cholecystectomy clips overlie the right upper  quadrant. Additional surgical clip present at the left hemipelvis. Degenerative spondylosis noted within the lower lumbar spine. No acute osseous finding. Visualized lung bases are grossly clear. IMPRESSION: Nonobstructive bowel gas pattern with no radiographic evidence for acute intra-abdominal pathology. Electronically Signed   By: Jeannine Boga M.D.   On: 01/10/2021 23:19   US RENAL  Result Date: 01/11/2021 CLINICAL DATA:  Acute kidney injury EXAM: RENAL / URINARY TRACT ULTRASOUND COMPLETE COMPARISON:  Ultrasound dated March 12, 2020.  MRI dated February 16, 2020 FINDINGS: Right Kidney: Renal measurements: 10.4.1 x 6.5 cm = volume: 150 mL. There is no hydronephrosis. The echogenicity is increased. There is a 1.5 cm hypoechoic lesion in the upper pole that is favored to represent a cyst. Left Kidney: Renal measurements: 12.1 x 3.7 x 4.3 cm = volume: 101.4 mL. The echogenicity is increased. There is no hydronephrosis. There is a prominent extrarenal pelvis. Again noted is a cystic appearing lesion involving the interpolar region measuring approximately 1.6 cm Bladder: There is a questionable filling defect within the urinary bladder near the right UVJ. Other: None. IMPRESSION: 1. No hydronephrosis. 2. Echogenic kidneys bilaterally which can be seen in patients with medical renal disease. 3. Questionable filling defect in the urinary bladder at the level of the right UVJ. Outpatient urology follow-up is recommended for this finding. Electronically Signed   By: Constance Holster M.D.   On: 01/11/2021 00:01   NM Myocar Multi W/Spect W/Wall Motion / EF  Result Date: 01/12/2021  There was no ST segment deviation noted during stress.  No T wave inversion was noted during stress.  The study is normal.  This is a low risk study.  The left ventricular ejection fraction is hyperdynamic (>65%).  Nuclear stress EF: 83%.  Normal stress nuclear study with no ischemia or infarction.  Gated ejection fraction 83% with  normal wall motion.   DG Chest Port 1 View  Result Date: 01/01/2021 CLINICAL DATA:  Chest pain and shortness of breath. EXAM: PORTABLE CHEST 1 VIEW COMPARISON:  12/25/2020. FINDINGS: Stable parenchymal density and adjacent pleural tenting in the right lower lung zone, shown by previous CT to be highly suspicious for malignancy. The remainder of the lungs are clear. Stable borderline enlarged cardiac silhouette. Stable left breast surgical clips. Diffuse osteopenia. IMPRESSION: 1. Stable previously demonstrated changes in the right lung suspicious for malignancy. 2. No acute abnormality. Electronically Signed   By: Claudie Revering M.D.   On: 01/01/2021 17:01   DG Chest Port 1 View  Result Date: 12/25/2020 CLINICAL DATA:  Chest pain. EXAM: PORTABLE CHEST 1 VIEW COMPARISON:  Chest radiograph December 11, 2020, chest CT December 04, 2020 FINDINGS: Calcific atherosclerotic disease of the aorta. Cardiomediastinal silhouette is normal. Mediastinal contours appear intact. Large airspace opacity in the right mid thorax extends into the pleura with associated tenting. When compared to patient's prior radiograph, the abnormality  has increased in prominence. Osseous structures are without acute abnormality. Stable postsurgical changes in the left breast/left thorax. IMPRESSION: Large airspace opacity in the right mid thorax extends to the lateral pleura with associated tenting. When compared to patient's prior radiograph, the abnormality has increased in prominence. As noted on the comparison chest CT, this finding is highly suspicious for malignancy. Recommend further evaluation with PET-CT, if one has not been performed. These results were called by telephone at the time of interpretation on 12/25/2020 at 6:00 pm to provider [facility] , who verbally acknowledged these results. Electronically Signed   By: Fidela Salisbury M.D.   On: 12/25/2020 18:02   CT Super D Chest Wo Contrast  Result Date: 01/04/2021 CLINICAL DATA:   Follow-up right lung mass. EXAM: CT CHEST WITHOUT CONTRAST TECHNIQUE: Multidetector CT imaging of the chest was performed using thin slice collimation for electromagnetic bronchoscopy planning purposes, without intravenous contrast. COMPARISON:  12/04/2020 FINDINGS: Cardiovascular: No acute findings. Aortic and coronary atherosclerotic calcification noted. Mediastinum/Nodes: No pathologically enlarged lymph nodes identified on this unenhanced exam. Mediastinal and hilar lymph node calcifications are seen, consistent with old granulomatous disease. Lungs/Pleura: Persistent spiculated mass is seen in the central right middle lobe, which abuts the minor and major fissures and the right hilum. This measures 5.2 x 3.5 cm on image 88/3, without significant change compared to prior study. No other suspicious pulmonary nodules or masses are identified. No evidence of pleural effusion. Upper Abdomen:  Unremarkable. Musculoskeletal:  No suspicious bone lesions. IMPRESSION: Stable 5.2 cm spiculated mass in central right middle lobe, consistent with primary bronchogenic carcinoma. No evidence of lymphadenopathy or pleural effusion. Aortic Atherosclerosis (ICD10-I70.0). Electronically Signed   By: Marlaine Hind M.D.   On: 01/04/2021 13:03    Microbiology: Recent Results (from the past 240 hour(s))  SARS CORONAVIRUS 2 (TAT 6-24 HRS) Nasopharyngeal Nasopharyngeal Swab     Status: None   Collection Time: 01/10/21  9:46 PM   Specimen: Nasopharyngeal Swab  Result Value Ref Range Status   SARS Coronavirus 2 NEGATIVE NEGATIVE Final    Comment: (NOTE) SARS-CoV-2 target nucleic acids are NOT DETECTED.  The SARS-CoV-2 RNA is generally detectable in upper and lower respiratory specimens during the acute phase of infection. Negative results do not preclude SARS-CoV-2 infection, do not rule out co-infections with other pathogens, and should not be used as the sole basis for treatment or other patient management  decisions. Negative results must be combined with clinical observations, patient history, and epidemiological information. The expected result is Negative.  Fact Sheet for Patients: SugarRoll.be  Fact Sheet for Healthcare Providers: https://www.woods-mathews.com/  This test is not yet approved or cleared by the [place] FDA and  has been authorized for detection and/or diagnosis of SARS-CoV-2 by FDA under an Emergency Use Authorization (EUA). This EUA will remain  in effect (meaning this test can be used) for the duration of the COVID-19 declaration under Se ction 564(b)(1) of the Act, 21 U.S.C. section 360bbb-3(b)(1), unless the authorization is terminated or revoked sooner.  Performed at [facility], [place] [address], [place], [place] [address]   SARS CORONAVIRUS 2 (TAT 6-24 HRS) Nasopharyngeal Nasopharyngeal Swab     Status: None   Collection Time: 01/12/21  2:41 PM   Specimen: Nasopharyngeal Swab  Result Value Ref Range Status   SARS Coronavirus 2 NEGATIVE NEGATIVE Final    Comment: (NOTE) SARS-CoV-2 target nucleic acids are NOT DETECTED.  The SARS-CoV-2 RNA is generally detectable in upper and lower  respiratory specimens during the acute phase of infection. Negative results do not preclude SARS-CoV-2 infection, do not rule out co-infections with other pathogens, and should not be used as the sole basis for treatment or other patient management decisions. Negative results must be combined with clinical observations, patient history, and epidemiological information. The expected result is Negative.  Fact Sheet for Patients: SugarRoll.be  Fact Sheet for Healthcare Providers: https://www.woods-mathews.com/  This test is not yet approved or cleared by the [place] FDA and  has been authorized for detection and/or diagnosis of SARS-CoV-2 by FDA under an Emergency Use  Authorization (EUA). This EUA will remain  in effect (meaning this test can be used) for the duration of the COVID-19 declaration under Se ction 564(b)(1) of the Act, 21 U.S.C. section 360bbb-3(b)(1), unless the authorization is terminated or revoked sooner.  Performed at [facility], [place] [address] [place], [place] [address]      Labs: Basic Metabolic Panel: Recent Labs  Lab 01/10/21 1551 01/11/21 0305 01/12/21 0626  NA 131* 136 132*  K 3.0* 3.5 3.7  CL 93* 98 98  CO2 26 30 25   GLUCOSE 385* 126* 203*  BUN 15 13 11   CREATININE 1.51* 1.45* 1.55*  CALCIUM 9.1 8.7* 8.6*  MG  --  1.5*  --    Liver Function Tests: Recent Labs  Lab 01/10/21 1551 01/11/21 0305  AST 12* 12*  ALT 9 9  ALKPHOS 81 64  BILITOT 0.5 0.4  PROT 5.8* 4.9*  ALBUMIN 2.7* 2.3*   Recent Labs  Lab 01/10/21 1551  LIPASE 32   No results for input(s): AMMONIA in the last 168 hours. CBC: Recent Labs  Lab 01/10/21 1551 01/11/21 0305  WBC 8.9 7.8  HGB 12.5 11.4*  HCT 39.0 35.0*  MCV 83.0 82.0  PLT 326 273   Cardiac Enzymes: No results for input(s): CKTOTAL, CKMB, CKMBINDEX, TROPONINI in the last 168 hours. BNP: BNP (last 3 results) Recent Labs    12/04/20 1903 12/11/20 1919 12/25/20 1722  BNP 96.3 103.7* 48.2    ProBNP (last 3 results) No results for input(s): PROBNP in the last 8760 hours.  CBG: Recent Labs  Lab 01/12/21 0637 01/12/21 1141 01/12/21 1657 01/12/21 2143 01/13/21 0644  GLUCAP 189* 282* 287* 259* 219*       Signed:  Domenic Polite MD.  [facility] 01/13/2021, 10:08 AM

## 2021-01-13 NOTE — Progress Notes (Signed)
Physical Therapy Treatment Patient Details Name: Dawn [name] MRN: [id number] DOB: 02-24-1941 Today's Date: 01/13/2021    History of Present Illness This 80 y.o. female admitted with generalized weakness, poor oral intake, and chest pressure.  Work up underway.  Dx thus far:  AKI, FTT, hypokalemia.  PMH:  recent discovery of Rt middle lobe lung mass presumed primary bronchogenic carcinoma (pt awaiting bronchoscopy as OP); ongoing tobacco use, DM, COPD, h/o breast CA    PT Comments    Pt making progress with mobility but remains weak and fatigues quickly. Pt remains confused and knows she is confused. Continue to recommend ST-SNF.    Follow Up Recommendations  SNF     Equipment Recommendations  None recommended by PT    Recommendations for Other Services       Precautions / Restrictions Precautions Precautions: Fall    Mobility  Bed Mobility Overal bed mobility: Needs Assistance Bed Mobility: Sit to Supine       Sit to supine: Min assist   General bed mobility comments: Assist to bring legs back into bed    Transfers Overall transfer level: Needs assistance Equipment used: Rolling walker (2 wheeled);4-wheeled walker Transfers: Sit to/from Stand Sit to Stand: Min guard         General transfer comment: Assist for safety and verbal cues for hand placement  Ambulation/Gait Ambulation/Gait assistance: Min guard Gait Distance (Feet): 30 Feet (30' x 2, 20' x1) Assistive device: Rolling walker (2 wheeled);4-wheeled walker Gait Pattern/deviations: Step-through pattern;Decreased stride length Gait velocity: decr Gait velocity interpretation: 1.31 - 2.62 ft/sec, indicative of limited community ambulator General Gait Details: Assist for safety. Pt fatigues quickly.   Stairs             Wheelchair Mobility    Modified Rankin (Stroke Patients Only)       Balance Overall balance assessment: Needs assistance Sitting-balance support: Feet supported Sitting  balance-Leahy Scale: Good     Standing balance support: During functional activity;No upper extremity supported Standing balance-Leahy Scale: Fair                              Cognition Arousal/Alertness: Awake/alert Behavior During Therapy: WFL for tasks assessed/performed Overall Cognitive Status: Impaired/Different from baseline Area of Impairment: Memory;Safety/judgement;Orientation                 Orientation Level: Disoriented to;Situation;Place;Time Current Attention Level: Selective Memory: Decreased short-term memory   Safety/Judgement: Decreased awareness of safety;Decreased awareness of deficits     General Comments: Pt states that she hates when she is confused.      Exercises      General Comments        Pertinent Vitals/Pain Pain Assessment: No/denies pain    Home Living                      Prior Function            PT Goals (current goals can now be found in the care plan section) Acute Rehab PT Goals Patient Stated Goal: to get stronger Progress towards PT goals: Progressing toward goals    Frequency    Min 2X/week      PT Plan Current plan remains appropriate    Co-evaluation              AM-PAC PT "6 Clicks" Mobility   Outcome Measure  Help needed turning from your back to your  side while in a flat bed without using bedrails?: A Little Help needed moving from lying on your back to sitting on the side of a flat bed without using bedrails?: A Little Help needed moving to and from a bed to a chair (including a wheelchair)?: A Little Help needed standing up from a chair using your arms (e.g., wheelchair or bedside chair)?: A Little Help needed to walk in hospital room?: A Little Help needed climbing 3-5 steps with a railing? : A Lot 6 Click Score: 17    End of Session Equipment Utilized During Treatment: Gait belt Activity Tolerance: Patient limited by fatigue Patient left: in bed;with call  bell/phone within reach;with bed alarm set   PT Visit Diagnosis: Other abnormalities of gait and mobility (R26.89);Muscle weakness (generalized) (M62.81)     Time: 1213-1227 PT Time Calculation (min) (ACUTE ONLY): 14 min  Charges:  $Gait Training: 8-22 mins                     [place] Pager [phone] Office [place] 01/13/2021, 12:38 PM

## 2021-01-13 NOTE — TOC Transition Note (Addendum)
Transition of Care (TOC) - CM/SW Discharge Note *Discharged to [facility] *Number for report: [phone]   Patient Details  Name: Dawn [name] MRN: [id number] Date of Birth: 1941-04-24  Transition of Care [facility]) CM/SW Contact:  Sable Feil, LCSW Phone Number: 01/13/2021, 12:40 PM   Clinical Narrative:  Patient medically stable for discharge and going to [facility] for [place] rehab. Visited with patient and informed her of today's discharge and son, [name] contacted [phone]) and informed of readiness for discharge today.  Talked with Perrin Smack at [facility] regarding patient discharging today. Discharge clinicals transmitted to facility.    Talked with Perrin Smack at [facility] regarding wait time for [facility]. Contacted [facility] and was informed that patient is 7th down on list and wait time a minimum of 2 hours. Kitty contacted and informed. Visited with patient again and updated her regarding approximate wait time for transport. Also informed patient that per [place] at [place], the facility is totally non-smoking inside and outside. A conversation ensued with patient regarding her smoking (50 plus years smoker), her desire to quit and prior efforts to quit. CSW actively listened and provided encouragement.    Final next level of care: [place] ([place]) Barriers to Discharge: Barriers Resolved   Patient Goals and CMS Choice Patient states their goals for this hospitalization and ongoing recovery are:: Patient and son agreeable to ST rehab, before returing home CMS Medicare.gov Compare Post Acute Care list provided to:: Patient Choice offered to / list presented to : [place]  Discharge Placement   Existing PASRR number confirmed : 01/12/21          Patient chooses bed at: [facility] Patient to be transferred to facility by: Non-emergency ambulance transport Name of family member notified:  [name] - son; [phone] Patient and family notified of of transfer: 01/13/21  Discharge Plan and Services     Post Acute Care Choice: [place]                              Social Determinants of Health (SDOH) Interventions  No SDOH interventions requested or needed at discharge.   Readmission Risk Interventions No flowsheet data found.

## 2021-01-14 ENCOUNTER — Telehealth: Payer: Self-pay | Admitting: Emergency Medicine

## 2021-01-14 ENCOUNTER — Encounter: Payer: Self-pay | Admitting: Internal Medicine

## 2021-01-14 ENCOUNTER — Non-Acute Institutional Stay (SKILLED_NURSING_FACILITY): Payer: Medicare Other | Admitting: Internal Medicine

## 2021-01-14 DIAGNOSIS — R918 Other nonspecific abnormal finding of lung field: Secondary | ICD-10-CM

## 2021-01-14 DIAGNOSIS — I10 Essential (primary) hypertension: Secondary | ICD-10-CM | POA: Diagnosis not present

## 2021-01-14 DIAGNOSIS — Z03818 Encounter for observation for suspected exposure to other biological agents ruled out: Secondary | ICD-10-CM | POA: Diagnosis not present

## 2021-01-14 DIAGNOSIS — N179 Acute kidney failure, unspecified: Secondary | ICD-10-CM

## 2021-01-14 DIAGNOSIS — R627 Adult failure to thrive: Secondary | ICD-10-CM | POA: Diagnosis not present

## 2021-01-14 DIAGNOSIS — J069 Acute upper respiratory infection, unspecified: Secondary | ICD-10-CM | POA: Diagnosis not present

## 2021-01-14 NOTE — Telephone Encounter (Signed)
Called and spoke with patient's son, Dawn [name], listed on the DPR, advised of recommendations per Dr. Lamonte Sakai.  Don needed to know where the PET scan would be done and what time.  His mom was called instead of him and she did not remember.  Advised per notes in appointment for PET scan:  arrive @ 10:30 @ EI:HDTPNSQ to eat 6hrs prior pt can drink water. He verbalized understanding.  He stated he would call his mom's cardiologist office to see if they had enough to clear her for Anesthesia.  Nothing further needed.

## 2021-01-14 NOTE — Assessment & Plan Note (Addendum)
PT/OT @ SNF as tolerated.

## 2021-01-14 NOTE — Telephone Encounter (Signed)
Called and spoke to pt's son, Dawn [name]. He states the pt was seen by Dr. Linna Darner. In Dr. Clayborn Heron note it states, "Cardiology consulted and ordered [facility] which was low risk, negative for ischemia or infarction".   Dawn [name] is questioning if pt still needs to see Cardiology tomorrow for clearance for bronch. Also, pt is scheduled for a PET scan tomorrow that was ordered by PCP. Dawn [name] is also questioning if this is necessary. Dr. Harrington Challenger' office (PCP) also called asking same question, if PET is needed.    Dr. Lamonte Sakai, please advise if pt needs to have PET scan and if info from Dr. Linna Darner will work for cardiology clearance and pt's OV with cards tomorrow can be cancelled. Thanks.

## 2021-01-14 NOTE — Telephone Encounter (Signed)
The PET scan will be helpful and I think she should still get it. Anesthesia had requested that she be cleared by cardiology.  I see that the Carlton Adam was reassuring.  If they want to call their cardiologist and see if they have enough information and that she does not need another office visit then that would be okay with me

## 2021-01-14 NOTE — Assessment & Plan Note (Signed)
BP controlled off ACE inhibitor; no change in verapamil dose.

## 2021-01-14 NOTE — Patient Instructions (Addendum)
See assessment and plan under each diagnosis in the problem list and acutely for this visit. I discussed code status with her son and explained likely ramifications of CPR. The designation "Full Code" subjects the patient to external cardiac compression, endotracheal intubation, and mechanical ventilator support.    In the presence of advanced and serious comorbid conditions as are present; no study has shown any benefit for these interventions. Unfortunately it simply prolongs the process of dying and and painfully so.    In fact external chest compression unfortunately will result in multiple rib and possibly sternum fractures with associated severe pain. 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    I strongly recommend that a "Limited Code" be considered. This would entail treating reversible processes such as serious heart irregularities with medications or even cardioversion; medication for pain control; and antibiotics for infections.    The emphasis should be on providing the individual you love so dearly with comfort and dignity. An incredibly sensitive and compassionate discussion of these issues is found in the book Being Mortal by Dr Eda Keys. I recommend  this to you without reservation. I think it will be a valuable guide for you in these difficult times.

## 2021-01-14 NOTE — Assessment & Plan Note (Addendum)
Please see Dr. Agustina Caroli assessment 4/6.  Cardiology has cleared her and bronchoscopy under general will be scheduled to facilitate navigation. Her son anticipates biopsy will reveal lung cancer.

## 2021-01-14 NOTE — Progress Notes (Signed)
NURSING HOME LOCATION:  [facility] ROOM NUMBER:  124 [id number]  CODE STATUS:  FULL  PCP:  Lawerance Cruel, MD  This is a comprehensive admission note to this SNFperformed on this date less than 30 days from date of admission. Included are preadmission medical/surgical history; reconciled medication list; family history; social history and comprehensive review of systems.  Corrections and additions to the records were documented. Comprehensive physical exam was also performed. Additionally a clinical summary was entered for each active diagnosis pertinent to this admission in the Problem List to enhance continuity of care.  HPI: Patient was hospitalized 4/30 - 01/13/2021 presenting to the ED with progressive and generalized weakness, ongoing intermittent chest pressure with exertion and overall failure to thrive.  This was in the context of a history of breast cancer and also documented right hilar lung mass of concern for bronchogenic carcinoma.  Bronchoscopy was to be scheduled once Cardiology clearance was provided. AKI was present with a creatinine of 1.5; baseline is felt to be less than 1.0.  EKG revealed no acute findings.  High sensitive troponin was in the high normal range.  Imaging documented the known right middle lobe mass.  Her COPD was associated with intermittent wheezing for which she received Brovana, Duo nebs, and albuterol as needed.  Supplemental oxygen was not necessary. Cardiology consulted and ordered [facility] which was low risk, negative for ischemia or infarction. Creatinine remained stable and quinapril was discontinued and verapamil continued. Metformin was resumed.  Renal ultrasound was unremarkable. Due to overall debility and associated memory/neurocognitive deficits she was discharged to the SNF for short-term rehab. Dr. Lamonte Sakai will perform bronchoscopy in the near future.  Past medical and surgical history: Includes history of breast  cancer with radiation, essential hypertension, oxygen dependent COPD, dyslipidemia, and diabetes with aortic atherosclerosis. Surgeries and procedures include abdominal hysterectomy, cholecystectomy, and partial mastectomy.  Social history: Rare alcohol intake; active smoker up until admission to the hospital with at least a 70-pack-year history of smoking.  Family history: Family history reveals colon and lung cancer in sisters.   Review of systems: Thankfully her son was in attendance and provided history.  He states that she has had neurocognitive issues for a while and her memory seems to wax and wane.  1 notable issue is she cannot remember where her granddaughter lives even though this has been discussed multiple times.  He states that she has been in the hospital 5 times in the last 6 weeks.  The initial hospitalization was related to a drop in hemoglobin from 10 to 7 requiring transfusion.  He states that she remains weak and shaky with profound exertional dyspnea.  She is unable to walk further than 6 feet due to the exertional dyspnea.  He states that he was having meals delivered to her home but found the vast majority of them in the refrigerator basically untouched.  He states she did eaten only part of 2 meals in the last 6 days prior to this last admission.  He states that she coughs repeatedly without sputum production.  She has smoked 1-2 packs/day for 65 years.  Most recently , he found a bill from [employer] for 5 cartons of cigarettes which may have been consumed in 3 weeks.  That would be a total of the 1000 cigarettes in 21 days.  He states when he was taking her home from 1 hospitalization, she mandated that he stop and buy cigarettes or she was going  to get out of the car and walk back to the store. She denied dyspepsia or heartburn but her son reminded her that she had complained of such.  She also describes dull lower sternal chest pain affected by position.  This is  nonradiating.  Constitutional: No fever Eyes: No redness, discharge, pain, vision change ENT/mouth: No nasal congestion, purulent discharge, earache, change in hearing, sore throat  Cardiovascular: No palpitations, paroxysmal nocturnal dyspnea, edema  Respiratory: No  hemoptysis,  significant snoring, apnea Gastrointestinal: No dysphagia, abdominal pain, nausea /vomiting, rectal bleeding, melena, change in bowels Genitourinary: No dysuria, hematuria, pyuria, incontinence, nocturia Musculoskeletal: No joint stiffness, joint swelling Dermatologic: No rash, pruritus, change in appearance of skin Neurologic: No  headache, syncope, seizures, numbness, tingling Psychiatric: No significant anxiety, depression, insomnia Endocrine: No change in hair/skin/nails, excessive thirst, excessive hunger, excessive urination  Hematologic/lymphatic: No significant bruising, lymphadenopathy, abnormal bleeding Allergy/immunology: No itchy/watery eyes, significant sneezing, urticaria, angioedema  Physical exam:  Pertinent or positive findings: She appears chronically ill.  Hair is disheveled.  She is edentulous.  Arcus senilis is present.  A soft S4 is present.  The second heart sound is slightly increased.  Bronchovesicular breath sounds are present with some musical expiratory rhonchi anteriorly.  Pedal pulses are palpable but decreased.  She is weak to opposition in all extremities.  Clubbing of the nailbeds is suggested.  General appearance: no acute distress, increased work of breathing is present.   Lymphatic: No lymphadenopathy about the head, neck, axilla. Eyes: No conjunctival inflammation or lid edema is present. There is no scleral icterus. Ears:  External ear exam shows no significant lesions or deformities.   Nose:  External nasal examination shows no deformity or inflammation. Nasal mucosa are pink and moist without lesions, exudates Oral exam: Lips and gums are healthy appearing.There is no  oropharyngeal erythema or exudate. Neck:  No thyromegaly, masses, tenderness noted.    Heart:  No gallop, murmur, click, rub.  Lungs:  without wheezes, rales, rubs. Abdomen: Bowel sounds are normal.  Abdomen is soft and nontender with no organomegaly, hernias, masses. GU: Deferred  Extremities:  No cyanosis, edema. Neurologic exam:  Balance, Rhomberg, finger to nose testing could not be completed due to clinical state Skin: Warm & dry w/o tenting. No significant lesions or rash.  See clinical summary under each active problem in the Problem List with associated updated therapeutic plan

## 2021-01-15 ENCOUNTER — Encounter (HOSPITAL_COMMUNITY): Payer: Self-pay

## 2021-01-15 ENCOUNTER — Other Ambulatory Visit: Payer: Self-pay | Admitting: *Deleted

## 2021-01-15 ENCOUNTER — Other Ambulatory Visit: Payer: Self-pay

## 2021-01-15 ENCOUNTER — Telehealth: Payer: Self-pay | Admitting: Emergency Medicine

## 2021-01-15 ENCOUNTER — Ambulatory Visit: Payer: Medicare Other | Admitting: Cardiology

## 2021-01-15 ENCOUNTER — Inpatient Hospital Stay: Admission: RE | Admit: 2021-01-15 | Payer: Medicare Other | Source: Ambulatory Visit

## 2021-01-15 ENCOUNTER — Ambulatory Visit (HOSPITAL_COMMUNITY)
Admission: RE | Admit: 2021-01-15 | Discharge: 2021-01-15 | Disposition: A | Discharge status: Home / Self Care | Payer: Medicare Other | Source: Ambulatory Visit | Attending: Family Medicine | Admitting: Family Medicine

## 2021-01-15 ENCOUNTER — Encounter (HOSPITAL_COMMUNITY): Payer: Medicare Other

## 2021-01-15 DIAGNOSIS — R9389 Abnormal findings on diagnostic imaging of other specified body structures: Secondary | ICD-10-CM | POA: Diagnosis not present

## 2021-01-15 DIAGNOSIS — C781 Secondary malignant neoplasm of mediastinum: Secondary | ICD-10-CM | POA: Insufficient documentation

## 2021-01-15 DIAGNOSIS — R918 Other nonspecific abnormal finding of lung field: Secondary | ICD-10-CM | POA: Insufficient documentation

## 2021-01-15 LAB — GLUCOSE, CAPILLARY
Glucose-Capillary: 314 mg/dL — ABNORMAL HIGH (ref 70–99)
Glucose-Capillary: 272 mg/dL — ABNORMAL HIGH (ref 70–99)
Glucose-Capillary: 279 mg/dL — ABNORMAL HIGH (ref 70–99)
Glucose-Capillary: 250 mg/dL — ABNORMAL HIGH (ref 70–99)

## 2021-01-15 MED ORDER — FLUDEOXYGLUCOSE F - 18 (FDG) INJECTION
5.5000 | Freq: Once | INTRAVENOUS | Status: AC
  Administered 2021-01-15: 5.77 via INTRAVENOUS

## 2021-01-15 NOTE — Progress Notes (Signed)
The proposed treatment discussed in cancer conference is for discussion purpose only and is not a binding recommendation. The patient was not physically examined nor present for their treatment options. Therefore, final treatment plans cannot be decided.  ?

## 2021-01-15 NOTE — Assessment & Plan Note (Signed)
Monitor renal function. Continue to hold ACE-I.

## 2021-01-15 NOTE — Telephone Encounter (Signed)
Spoke with son [name] (per [facility]) and notified of cancellation of Cardiology appointment and Office visit with Dr. Lamonte Sakai per Dr. Lamonte Sakai. Pt will be getting PET scan done today per son.

## 2021-01-15 NOTE — Telephone Encounter (Signed)
[place] is [place] I have had Dawn Bamberg do a letter to let them know what meds to stop it has been faxed covid test will be done at the facility. [facility] and her Son Dawn [name] have been informed about the appt and which meds to stop

## 2021-01-16 ENCOUNTER — Other Ambulatory Visit: Payer: Self-pay

## 2021-01-16 ENCOUNTER — Emergency Department (HOSPITAL_COMMUNITY)
Admission: EM | Admit: 2021-01-16 | Discharge: 2021-01-16 | Disposition: A | Discharge status: Home / Self Care | Payer: Medicare Other | Attending: Emergency Medicine | Admitting: Emergency Medicine

## 2021-01-16 ENCOUNTER — Encounter (HOSPITAL_COMMUNITY): Payer: Self-pay | Admitting: *Deleted

## 2021-01-16 ENCOUNTER — Emergency Department (HOSPITAL_COMMUNITY): Payer: Medicare Other

## 2021-01-16 ENCOUNTER — Ambulatory Visit: Payer: Medicare Other | Admitting: Emergency Medicine

## 2021-01-16 DIAGNOSIS — Z7984 Long term (current) use of oral hypoglycemic drugs: Secondary | ICD-10-CM | POA: Insufficient documentation

## 2021-01-16 DIAGNOSIS — I517 Cardiomegaly: Secondary | ICD-10-CM | POA: Diagnosis not present

## 2021-01-16 DIAGNOSIS — F17210 Nicotine dependence, cigarettes, uncomplicated: Secondary | ICD-10-CM | POA: Diagnosis not present

## 2021-01-16 DIAGNOSIS — E119 Type 2 diabetes mellitus without complications: Secondary | ICD-10-CM | POA: Diagnosis not present

## 2021-01-16 DIAGNOSIS — I10 Essential (primary) hypertension: Secondary | ICD-10-CM | POA: Diagnosis not present

## 2021-01-16 DIAGNOSIS — R079 Chest pain, unspecified: Secondary | ICD-10-CM | POA: Diagnosis not present

## 2021-01-16 DIAGNOSIS — M2550 Pain in unspecified joint: Secondary | ICD-10-CM | POA: Diagnosis not present

## 2021-01-16 DIAGNOSIS — Z7401 Bed confinement status: Secondary | ICD-10-CM | POA: Diagnosis not present

## 2021-01-16 DIAGNOSIS — Z853 Personal history of malignant neoplasm of breast: Secondary | ICD-10-CM | POA: Insufficient documentation

## 2021-01-16 DIAGNOSIS — R1013 Epigastric pain: Secondary | ICD-10-CM | POA: Diagnosis not present

## 2021-01-16 DIAGNOSIS — J449 Chronic obstructive pulmonary disease, unspecified: Secondary | ICD-10-CM | POA: Insufficient documentation

## 2021-01-16 DIAGNOSIS — Z743 Need for continuous supervision: Secondary | ICD-10-CM | POA: Diagnosis not present

## 2021-01-16 DIAGNOSIS — Z7982 Long term (current) use of aspirin: Secondary | ICD-10-CM | POA: Insufficient documentation

## 2021-01-16 DIAGNOSIS — R0789 Other chest pain: Secondary | ICD-10-CM | POA: Diagnosis not present

## 2021-01-16 DIAGNOSIS — Z79899 Other long term (current) drug therapy: Secondary | ICD-10-CM | POA: Insufficient documentation

## 2021-01-16 DIAGNOSIS — J90 Pleural effusion, not elsewhere classified: Secondary | ICD-10-CM | POA: Diagnosis not present

## 2021-01-16 DIAGNOSIS — R6889 Other general symptoms and signs: Secondary | ICD-10-CM | POA: Diagnosis not present

## 2021-01-16 DIAGNOSIS — E1165 Type 2 diabetes mellitus with hyperglycemia: Secondary | ICD-10-CM | POA: Diagnosis not present

## 2021-01-16 DIAGNOSIS — J439 Emphysema, unspecified: Secondary | ICD-10-CM | POA: Diagnosis not present

## 2021-01-16 LAB — COMPREHENSIVE METABOLIC PANEL
ALT: 11 U/L (ref 0–44)
AST: 13 U/L — ABNORMAL LOW (ref 15–41)
Albumin: 2.2 g/dL — ABNORMAL LOW (ref 3.5–5.0)
Alkaline Phosphatase: 59 U/L (ref 38–126)
Anion gap: 9 (ref 5–15)
BUN: 12 mg/dL (ref 8–23)
CO2: 24 mmol/L (ref 22–32)
Calcium: 8.5 mg/dL — ABNORMAL LOW (ref 8.9–10.3)
Chloride: 97 mmol/L — ABNORMAL LOW (ref 98–111)
Creatinine, Ser: 1.01 mg/dL — ABNORMAL HIGH (ref 0.44–1.00)
GFR, Estimated: 57 mL/min — ABNORMAL LOW (ref >60)
Glucose, Bld: 258 mg/dL — ABNORMAL HIGH (ref 70–99)
Potassium: 3.6 mmol/L (ref 3.5–5.1)
Sodium: 130 mmol/L — ABNORMAL LOW (ref 135–145)
Total Bilirubin: 0.4 mg/dL (ref 0.3–1.2)
Total Protein: 4.8 g/dL — ABNORMAL LOW (ref 6.5–8.1)

## 2021-01-16 LAB — CBC WITH DIFFERENTIAL/PLATELET
Abs Immature Granulocytes: 0.04 10*3/uL (ref 0.00–0.07)
Basophils Absolute: 0 10*3/uL (ref 0.0–0.1)
Basophils Relative: 1 %
Eosinophils Absolute: 0 10*3/uL (ref 0.0–0.5)
Eosinophils Relative: 1 %
HCT: 28.8 % — ABNORMAL LOW (ref 36.0–46.0)
Hemoglobin: 9.1 g/dL — ABNORMAL LOW (ref 12.0–15.0)
Immature Granulocytes: 1 %
Lymphocytes Relative: 24 %
Lymphs Abs: 1 10*3/uL (ref 0.7–4.0)
MCH: 27.2 pg (ref 26.0–34.0)
MCHC: 31.6 g/dL (ref 30.0–36.0)
MCV: 86 fL (ref 80.0–100.0)
Monocytes Absolute: 0.3 10*3/uL (ref 0.1–1.0)
Monocytes Relative: 7 %
Neutro Abs: 2.8 10*3/uL (ref 1.7–7.7)
Neutrophils Relative %: 66 %
Platelets: 226 10*3/uL (ref 150–400)
RBC: 3.35 MIL/uL — ABNORMAL LOW (ref 3.87–5.11)
RDW: 21.8 % — ABNORMAL HIGH (ref 11.5–15.5)
WBC: 4.2 10*3/uL (ref 4.0–10.5)
nRBC: 0 % (ref 0.0–0.2)

## 2021-01-16 LAB — POC OCCULT BLOOD, ED: Fecal Occult Bld: NEGATIVE

## 2021-01-16 LAB — HEMOGLOBIN AND HEMATOCRIT, BLOOD
HCT: 27.9 % — ABNORMAL LOW (ref 36.0–46.0)
Hemoglobin: 9 g/dL — ABNORMAL LOW (ref 12.0–15.0)

## 2021-01-16 LAB — LIPASE, BLOOD: Lipase: 33 U/L (ref 11–51)

## 2021-01-16 LAB — TROPONIN I (HIGH SENSITIVITY)
Troponin I (High Sensitivity): 8 ng/L (ref <18)
Troponin I (High Sensitivity): 9 ng/L (ref <18)

## 2021-01-16 MED ORDER — ALUM & MAG HYDROXIDE-SIMETH 200-200-20 MG/5ML PO SUSP
30.0000 mL | Freq: Once | ORAL | Status: AC
  Administered 2021-01-16: 30 mL via ORAL
  Filled 2021-01-16: qty 30

## 2021-01-16 MED ORDER — LIDOCAINE VISCOUS HCL 2 % MT SOLN
15.0000 mL | Freq: Once | OROMUCOSAL | Status: AC
  Administered 2021-01-16: 15 mL via ORAL
  Filled 2021-01-16: qty 15

## 2021-01-16 MED ORDER — PANTOPRAZOLE SODIUM 40 MG PO TBEC: 40.0000 mg | DELAYED_RELEASE_TABLET | Freq: Every day | ORAL | 0 refills | Status: DC

## 2021-01-16 NOTE — ED Triage Notes (Signed)
Pt sent here from [facility] for acute onset chest pain approx 2 hours ago.  Given protonix  With no relief.  Pt given 500 ns for sbp of 105 and 4 of zofran for feeling "swimmyheaded".

## 2021-01-16 NOTE — Discharge Instructions (Addendum)
Call your doctor in the morning, you need to see them very soon to follow-up your worsening anemia. Return to the emergency department if you have shortness of breath, worsening chest pain, bloody bowel movements, or throat blood as these would be signs of a medical emergency.  Start taking Protonix daily, this will help protect your stomach and start helping with your pain.

## 2021-01-16 NOTE — ED Notes (Signed)
Pt discharged back to facility by [facility] crew.

## 2021-01-16 NOTE — ED Provider Notes (Signed)
[place] EMERGENCY DEPARTMENT Provider Note   CSN: [id number] Arrival date & time: 01/16/21  1459     History Chief Complaint  Patient presents with  . Chest Pain    Dawn [name] is a 80 y.o. female.  HPI 80 year old female with history of COPD, diabetes, hypertension, hyperlipidemia, and lung cancer presents the emergency department for chest pain.  States it is in the middle of her chest, just above her abdomen.  Does not radiate.  Feels like a pressure.  She has not noticed any exacerbating or alleviating factors.  Was given Protonix at her facility without improvement.  States she is having a little bit worsened shortness of breath, but has not been hypoxic.  States she has had episodes like this previously, is uncertain what causes them.  States that it is improved and is now mild pain.  Denies nausea, fevers, dysuria, cough, headaches, and abdominal pain.  HPI: A 80 year old patient with a history of hypertension and hypercholesterolemia presents for evaluation of chest pain. Initial onset of pain was more than 6 hours ago. The patient's chest pain is well-localized, is described as heaviness/pressure/tightness and is not worse with exertion. The patient's chest pain is middle- or left-sided, is not sharp and does not radiate to the arms/jaw/neck. The patient does not complain of nausea and denies diaphoresis. The patient has no history of stroke, has no history of peripheral artery disease, has not smoked in the past 90 days, denies any history of treated diabetes, has no relevant family history of coronary artery disease (first degree relative at less than age 74) and does not have an elevated BMI (>=30).   Past Medical History:  Diagnosis Date  . Anxiety   . Breast cancer of upper-outer quadrant of left female breast ([place]) 08/18/2015  . Broken wrist    when younger x2  . COPD (chronic obstructive pulmonary disease) [facility])    Hospitalization March 2011  .  Depression   . Diabetes mellitus without complication ([place])   . Dyspnea   . Full dentures   . H/O vaginal delivery 1962   x1   . High cholesterol   . History of home oxygen therapy   . Hypertension   . Pneumonia   . Radiation    11/18/15- 12/16/15 to her Left Breast    Patient Active Problem List   Diagnosis Date Noted  . Adult failure to thrive 01/14/2021  . Acute kidney injury ([place]) 01/10/2021  . Hypoxic 01/01/2021  . Shortness of breath 01/01/2021  . Mild mitral regurgitation 01/01/2021  . Pneumonia   . Hypertension   . History of home oxygen therapy   . High cholesterol   . Full dentures   . Diabetes mellitus without complication ([place])   . Depression   . Broken wrist   . Anxiety   . Generalized weakness 12/12/2020  . Cigar smoker 12/12/2020  . Aortic atherosclerosis ([place]) 12/12/2020  . Hypertension associated with diabetes ([place]) 12/11/2020  . Hyperlipidemia associated with type 2 diabetes mellitus ([place]) 12/11/2020  . Hypokalemia 12/11/2020  . Symptomatic anemia 12/04/2020  . Type 2 diabetes mellitus ([place]) 12/04/2020  . COPD (chronic obstructive pulmonary disease) ([place]) 12/04/2020  . Mass of right lung 12/04/2020  . Tobacco abuse 09/30/2015  . Breast cancer of upper-outer quadrant of left female breast ([place]) 08/18/2015  . Essential hypertension, benign 05/22/2014  . Mixed hyperlipidemia 05/22/2014    Past Surgical History:  Procedure Laterality Date  . ABDOMINAL HYSTERECTOMY    .  APPENDECTOMY    . CHOLECYSTECTOMY    . EYE SURGERY     cataracts  . RADIOACTIVE SEED GUIDED PARTIAL MASTECTOMY WITH AXILLARY SENTINEL LYMPH NODE BIOPSY Left 09/17/2015   Procedure: RADIOACTIVE SEED LOCALIZATION LEFT BREAST LUMPECTOMY AND LEFT AXILLARY SENTINEL LYMPH NODE BIOPSY;  Surgeon: Excell Seltzer, MD;  Location: [place];  Service: General;  Laterality: Left;  . TONSILLECTOMY       OB History   No obstetric history on file.     Family History  Problem Relation Age of Onset   . Colon cancer Sister   . Lung cancer Sister     Social History   Tobacco Use  . Smoking status: Current Every Day Smoker    Packs/day: 1.00    Years: 70.00    Pack years: 70.00    Types: Cigarettes  . Smokeless tobacco: Never Used  . Tobacco comment: less then 1 pack smoked per day 12/17/20 [facility]   Vaping Use  . Vaping Use: Never used  Substance Use Topics  . Alcohol use: Yes    Comment: rare  . Drug use: No    Home Medications Prior to Admission medications   Medication Sig Start Date End Date Taking? Authorizing Provider  albuterol (VENTOLIN HFA) 108 (90 Base) MCG/ACT inhaler Inhale 2 puffs into the lungs every 2 (two) hours as needed for wheezing or shortness of breath. Please instruct in usage Patient taking differently: Inhale 2 puffs into the lungs every 2 (two) hours as needed for wheezing or shortness of breath. 12/13/20   Samuella Cota, MD  anastrozole (ARIMIDEX) 1 MG tablet Take 1 mg by mouth daily. 12/23/20   [provider]  aspirin EC 81 MG tablet Take 81 mg by mouth daily.    [provider]  Cholecalciferol (VITAMIN D3) 5000 UNITS TABS Take 5,000 Units by mouth daily.    [provider]  metFORMIN (GLUCOPHAGE-XR) 500 MG 24 hr tablet Take 1 tablet (500 mg total) by mouth 2 (two) times daily. Patient taking differently: Take 100 mg by mouth daily with breakfast. 12/06/20 03/06/21  Dahal, Marlowe Aschoff, MD  nicotine (NICODERM CQ - DOSED IN MG/24 HOURS) 14 mg/24hr patch Place 1 patch (14 mg total) onto the skin daily. 01/14/21   Domenic Polite, MD  pantoprazole (PROTONIX) 40 MG tablet Take 1 tablet (40 mg total) by mouth daily before breakfast. 01/16/21 02/15/21  Suzan Nailer, DO  pentoxifylline (TRENTAL) 400 MG CR tablet Take 1 tablet (400 mg total) by mouth 3 (three) times daily with meals. Patient taking differently: Take 800 mg by mouth in the morning. 06/03/20   Nicholas Lose, MD  pioglitazone (ACTOS) 45 MG tablet Take 45 mg by mouth daily.  12/18/20   [provider]  rosuvastatin (CRESTOR) 40 MG tablet Take 40 mg by mouth daily.    [provider]  Tiotropium Bromide-Olodaterol (STIOLTO RESPIMAT) 2.5-2.5 MCG/ACT AERS Inhale 2 puffs into the lungs daily. 12/17/20   Collene Gobble, MD  verapamil (CALAN-SR) 240 MG CR tablet Take 240 mg by mouth daily. 06/10/15   [provider]    Allergies    Ferumoxytol, Pneumococcal vaccines, and Repaglinide  Review of Systems   Review of Systems  Constitutional: Negative for chills and fever.  HENT: Negative for ear pain and sore throat.   Eyes: Negative for pain and visual disturbance.  Respiratory: Positive for shortness of breath. Negative for cough.   Cardiovascular: Positive for chest pain. Negative for palpitations and leg swelling.  Gastrointestinal: Negative for abdominal pain and vomiting.  Genitourinary: Negative for dysuria and hematuria.  Musculoskeletal: Negative for arthralgias and back pain.  Skin: Negative for color change and rash.  Neurological: Negative for seizures and syncope.  All other systems reviewed and are negative.   Physical Exam Updated Vital Signs BP (!) 114/52   Pulse 63   Temp 97.9 F (36.6 C) (Oral)   Resp (!) 22   Ht 5\' 4"  (1.626 m)   Wt 52.2 kg   SpO2 (!) 89%   BMI 19.74 kg/m   Physical Exam Vitals and nursing note reviewed.  Constitutional:      General: She is not in acute distress.    Appearance: She is well-developed.  HENT:     Head: Normocephalic and atraumatic.  Eyes:     Extraocular Movements: Extraocular movements intact.     Conjunctiva/sclera: Conjunctivae normal.  Cardiovascular:     Rate and Rhythm: Normal rate and regular rhythm.     Heart sounds: Normal heart sounds. No murmur heard.   Pulmonary:     Effort: Pulmonary effort is normal. No respiratory distress.     Breath sounds: Normal breath sounds.  Chest:     Chest wall: Tenderness present.  Abdominal:     Palpations: Abdomen is soft.      Tenderness: There is abdominal tenderness (epigastric, reproduces pain).  Musculoskeletal:     Cervical back: Neck supple.     Right lower leg: No tenderness. No edema.     Left lower leg: No tenderness. No edema.  Skin:    General: Skin is warm and dry.     Capillary Refill: Capillary refill takes less than 2 seconds.  Neurological:     General: No focal deficit present.     Mental Status: She is alert and oriented to person, place, and time.  Psychiatric:        Mood and Affect: Mood normal.        Behavior: Behavior normal.     ED Results / Procedures / Treatments   Labs (all labs ordered are listed, but only abnormal results are displayed) Labs Reviewed  CBC WITH DIFFERENTIAL/PLATELET - Abnormal; Notable for the following components:      Result Value   RBC 3.35 (*)    Hemoglobin 9.1 (*)    HCT 28.8 (*)    RDW 21.8 (*)    All other components within normal limits  COMPREHENSIVE METABOLIC PANEL - Abnormal; Notable for the following components:   Sodium 130 (*)    Chloride 97 (*)    Glucose, Bld 258 (*)    Creatinine, Ser 1.01 (*)    Calcium 8.5 (*)    Total Protein 4.8 (*)    Albumin 2.2 (*)    AST 13 (*)    GFR, Estimated 57 (*)    All other components within normal limits  HEMOGLOBIN AND HEMATOCRIT, BLOOD - Abnormal; Notable for the following components:   Hemoglobin 9.0 (*)    HCT 27.9 (*)    All other components within normal limits  LIPASE, BLOOD  POC OCCULT BLOOD, ED  TROPONIN I (HIGH SENSITIVITY)  TROPONIN I (HIGH SENSITIVITY)    EKG None  Radiology DG Chest 2 View  Result Date: 01/16/2021 CLINICAL DATA:  Chest pain EXAM: CHEST - 2 VIEW COMPARISON:  01/10/2021 radiograph and chest CT, chest x-ray 12/04/2020 FINDINGS: Right middle lobe lung mass redemonstrated, with extension to adjacent pleural surface. Small bilateral pleural effusions. Emphysema. Cardiomegaly with  aortic atherosclerosis. No pneumothorax. Calcified granuloma in the right lung  base. IMPRESSION: 1. Redemonstrated right middle lobe lung mass 2. Interim small bilateral pleural effusions. 3. Mild cardiomegaly.  Emphysema. Electronically Signed   By: Donavan Foil M.D.   On: 01/16/2021 16:07   NM PET Image Initial (PI) Skull Base To Thigh  Result Date: 01/16/2021 CLINICAL DATA:  Initial treatment strategy for lung mass. EXAM: NUCLEAR MEDICINE PET SKULL BASE TO THIGH TECHNIQUE: 5.8 mCi F-18 FDG was injected intravenously. Full-ring PET imaging was performed from the skull base to thigh after the radiotracer. CT data was obtained and used for attenuation correction and anatomic localization. Fasting blood glucose: 250 mg/dl COMPARISON:  01/02/2021 FINDINGS: Mediastinal blood pool activity: SUV max 2.2 Liver activity: SUV max NA NECK: No hypermetabolic lymph nodes in the neck. Incidental CT findings: none CHEST: Lobulated mass in the RIGHT middle lobe measures 6.5 x 4.1 cm with intense metabolic activity (SUV max equal 12.2). Mass abuts the horizontal fissure and oblique fissure. Hypermetabolic subcarinal lymph node measures 12 mm short axis with SUV max equal 7.1 no paratracheal adenopathy. No supraclavicular adenopathy. Incidental CT findings: Coronary artery calcification and aortic atherosclerotic calcification. Calcified hilar nodes and calcified pulmonary granulomas. ABDOMEN/PELVIS: No abnormal hypermetabolic activity within the liver, pancreas, adrenal glands, or spleen. No hypermetabolic lymph nodes in the abdomen or pelvis. Incidental CT findings: Atherosclerotic calcification of the aorta. Postcholecystectomy. Hysterectomy. SKELETON: No focal hypermetabolic activity to suggest skeletal metastasis. Incidental CT findings: none IMPRESSION: 1. Hypermetabolic mass in the RIGHT middle lobe consistent with primary bronchogenic carcinoma. Mass abuts the horizontal fissure and oblique fissure. 2. Mediastinal nodal metastasis to the subcarinal lymph node. 3. FDG PET staging: T3 N2 m0 4. No  distant metastatic disease Electronically Signed   By: Suzy Bouchard M.D.   On: 01/16/2021 09:43    Procedures Procedures   Medications Ordered in ED Medications  alum & mag hydroxide-simeth (MAALOX/MYLANTA) 200-200-20 MG/5ML suspension 30 mL (30 mLs Oral Given 01/16/21 1715)    And  lidocaine (XYLOCAINE) 2 % viscous mouth solution 15 mL (15 mLs Oral Given 01/16/21 1715)    ED Course  I have reviewed the triage vital signs and the nursing notes.  Pertinent labs & imaging results that were available during my care of the patient were reviewed by me and considered in my medical decision making (see chart for details).    MDM Rules/Calculators/A&P HEAR Score: 10                        80 year old female presents with chest pain.  She is hemodynamically stable, airway is intact.  Not in acute distress.  Exam shows more concern for epigastric pain and chest pain.  She is tender down her sternum, but then very tender in her epigastrium.  No rebound or guarding, does not have signs of peritonitis.  Heart auscultation without significant abnormality.  She does have some expiratory crackles diffusely, consistent with smoking history.    Differential includes: ACS, gastritis, GERD, pancreatitis, PE, pneumothorax, pericardial effusion, aortic dissection, aneurysm  Presentation less consistent with ACS due to reproducible pain.  EKG is nonischemic.  Additionally, patient had a negative stress test on 5/3 which is very reassuring.  Chest x-ray does not show pneumothorax.  Less likely PE with no signs or symptoms of DVT.  She is not really difficultly short of breath, that was brought up on review of systems.  Additionally, she is not hypoxic or tachycardic.  Story not consistent with dissection or aneurysm.  Could be pain due to her recent cancer diagnosis, but less likely localizing to that area.  Labs with troponin negative x2.  Does have worsening anemia over the last month.  Denies history of  bloody stools, but fecal occult blood test was taken.  This was negative.  Otherwise labs unremarkable.  Chest x-ray without acute cardiac or pulmonary abnormality.  Hear score 3, low risk.    Repeat hemoglobin shows that it is not significantly decreasing over 5 hours in the emergency department.  As she is not having active bleeding and is hemodynamically stable.  We will start Protonix daily and have her follow-up very closely in the next 1 to 2 days with her PCP.  Patient feels comfortable going home.  Discussed symptomatic management and return precautions.  I recommended close PCP follow-up.  Patient was discharged stable condition.    Final Clinical Impression(s) / ED Diagnoses Final diagnoses:  Chest pain, unspecified type  Epigastric pain    Rx / DC Orders ED Discharge Orders         Ordered    pantoprazole (PROTONIX) 40 MG tablet  Daily before breakfast        01/16/21 2125           Suzan Nailer, DO 01/16/21 2126    Varney Biles, MD 01/19/21 1555

## 2021-01-16 NOTE — ED Notes (Signed)
Pt to xray

## 2021-01-16 NOTE — ED Notes (Signed)
PTAR CALLED  °

## 2021-01-21 ENCOUNTER — Telehealth: Payer: Self-pay | Admitting: *Deleted

## 2021-01-21 ENCOUNTER — Encounter (HOSPITAL_COMMUNITY): Payer: Self-pay | Admitting: Emergency Medicine

## 2021-01-21 ENCOUNTER — Ambulatory Visit (HOSPITAL_COMMUNITY): Payer: Medicare Other | Admitting: Vascular Surgery

## 2021-01-21 DIAGNOSIS — R918 Other nonspecific abnormal finding of lung field: Secondary | ICD-10-CM

## 2021-01-21 NOTE — Anesthesia Preprocedure Evaluation (Deleted)
Anesthesia Evaluation  Patient identified by MRN, date of birth, ID band Patient awake and Patient confused    Reviewed: Allergy & Precautions, NPO status , Patient's Chart, lab work & pertinent test results  Airway Mallampati: III  TM Distance: >3 FB Neck ROM: Full    Dental  (+) Edentulous Lower, Edentulous Upper   Pulmonary shortness of breath and with exertion, pneumonia, COPD,  COPD inhaler and oxygen dependent, Current Smoker and Patient abstained from smoking.,  RUL mass   Pulmonary exam normal breath sounds clear to auscultation       Cardiovascular hypertension, Pt. on medications Normal cardiovascular exam Rhythm:Regular Rate:Normal  Nuclear stress test 01/12/21: There was no ST segment deviation noted during stress. No T wave inversion was noted during stress. The study is normal.This is a low risk study. The left ventricular ejection fraction is hyperdynamic (>65%).Nuclear stress EF: 83%.Normal stress nuclear study with no ischemia or infarction.  Gated ejection fraction 83% with normal wall motion.   Echo 12/05/20: IMPRESSIONS  1. Left ventricular ejection fraction, by estimation, is 60 to 65%. The left ventricle has normal function. The left ventricle has no regional wall motion abnormalities. Left ventricular diastolic parameters are consistent with Grade I diastolic dysfunction (impaired relaxation). Elevated left atrial pressure.  2. Right ventricular systolic function is normal. The right ventricular size is normal. Tricuspid regurgitation signal is inadequate for assessing PA pressure.  3. The mitral valve is normal in structure. No evidence of mitral valve regurgitation. Mild mitral stenosis. The mean mitral valve gradient is 3.5 mmHg. Moderate mitral annular calcification.  4. The aortic valve is normal in structure. Aortic valve regurgitation is not visualized. Mild aortic valve sclerosis is present, with no  evidence of aortic valve stenosis.  5. The inferior vena cava is normal in size with greater than 50% respiratory variability, suggesting right atrial pressure of 3 mmHg.   EKG 01/16/21 NSR, possible anteroseptal MI- old, no acute changes    Neuro/Psych neg Headaches, PSYCHIATRIC DISORDERS Anxiety Depression Failure to thrive Cognitive impairment which has yet to be diagnosed Poor memory according to son which has been getting progressively worse over last 3 monthsnegative neurological ROS     GI/Hepatic negative GI ROS, Neg liver ROS,   Endo/Other  diabetes, Poorly Controlled, Type 2, Oral Hypoglycemic AgentsLeft Breast Ca S/P Left partial mastectomy and AND , Hormonal therapy and RT  Hyperlipidemia  Renal/GU Renal InsufficiencyRenal disease  negative genitourinary   Musculoskeletal  (+) Arthritis , Osteoarthritis,    Abdominal   Peds  Hematology  (+) anemia ,   Anesthesia Other Findings   Reproductive/Obstetrics                          Anesthesia Physical Anesthesia Plan  ASA: III  Anesthesia Plan: General   Post-op Pain Management:    Induction: Intravenous  PONV Risk Score and Plan: 2 and Treatment may vary due to age or medical condition  Airway Management Planned: Oral ETT  Additional Equipment:   Intra-op Plan:   Post-operative Plan: Extubation in OR  Informed Consent: I have reviewed the patients History and Physical, chart, labs and discussed the procedure including the risks, benefits and alternatives for the proposed anesthesia with the patient or authorized representative who has indicated his/her understanding and acceptance.       Plan Discussed with: CRNA and Anesthesiologist  Anesthesia Plan Comments: (PAT note written 01/21/2021 by Myra Gianotti, PA-C.   Case cancelled due  to patient testing + for Covid. )      Anesthesia Quick Evaluation

## 2021-01-21 NOTE — Progress Notes (Signed)
Adonis Huguenin, LPN Surgical Instructions for Dawn [name] [phone]) Day of Surgery - 01/22/21      Your procedure is scheduled on Thursday, 01/22/21.  Report to [facility] Main Entrance "A" at 7 A.M., then check in with the Admitting office.  Call this number if you have problems the morning of surgery:  [phone]   If you have any questions prior to your surgery date call [phone]: Open Monday-Friday 8am-4pm    Remember:  Do not eat or drink after midnight tonight- Wednesday 01/21/21.     Take these medicines the morning of surgery with A SIP OF WATER: Anastrozole Protonix Trental  Crestor Stiolto Inhaler Albuterol inhaler if needed  Verapamil  As of today, STOP taking any Aspirin (unless otherwise instructed by your surgeon) Aleve, Naproxen, Ibuprofen, Motrin, Advil, Goody's, BC's, all herbal medications, fish oil, and all vitamins.                     Do not wear jewelry, make up, or nail polish            Do not wear lotions, powders, perfumes, or deodorant.            Do not shave 48 hours prior to surgery.              Do not bring valuables to the hospital.            [facility] is not responsible for any belongings or valuables.  Do NOT Smoke (Tobacco/Vaping) or drink Alcohol 24 hours prior to your procedure If you use a CPAP at night, you may bring all equipment for your overnight stay.   Contacts, glasses, dentures or bridgework may not be worn into surgery, please bring cases for these belongings   Oral Hygiene is also important to reduce your risk of infection.  Remember - BRUSH YOUR TEETH THE MORNING OF SURGERY WITH YOUR REGULAR TOOTHPASTE  Patients discharged the day of surgery will not be allowed to drive home, and someone needs to stay with them for 24 hours.  Remember on the Day of Surgery:  Wear Clean/Comfortable clothing the morning of surgery  Do not apply any deodorants/lotions.    Remember to brush your teeth WITH YOUR REGULAR  TOOTHPASTE.

## 2021-01-21 NOTE — Progress Notes (Signed)
Anesthesia Chart Review: SAME DAY WORK-UP   Case: [id number] Date/Time: 01/22/21 1000   Procedure: VIDEO BRONCHOSCOPY WITH ENDOBRONCHIAL NAVIGATION (N/A )   Anesthesia type: General   Pre-op diagnosis: RIGHT LUNG MASS   Location: [facility]   Surgeons: Collene Gobble, MD      DISCUSSION: Patient is a 80 year old female scheduled for the above procedure. Procedure was initially scheduled for 01/05/21, but when she presented for preoperative cardiology evaluation on 01/01/21 she was sent to the ED due to significant SOB with O2 sats ~ 87%. Serial troponin x2 negative. COVID negative. EKG and CXR felt stable. Glucose 561, Na 123 (corrected to 130), and Creatinine 2.0 (up from 1.28 12/25/20). She was treated with nebulizer, insulin and IVF. O2 sats improved to the low 90's and discharged on a steroid taper. Surgery was postponed until she could complete cardiology evaluation. Since then she was admitted 01/10/21-01/13/21 (see below) and was seen by cardiology during that admission and had a non-ischemic stress test.   History includes smoking (70 pack years), left breast cancer (stage IIA s/p left breast lumpectomy 09/17/15, s/p radiation, anti-estrogen therapy), HTN, DM2, hypercholesterolemia, COPD (home O2), dyspnea.   - [place] admission 12/04/20-12/06/20 with SOB and symptomatic anemia (HGB 7.6, hemoccult negative, s/p 2 units PRBC, low ferritin 6, but had allergic reaction to IV iron). CT chest showed RML masslike consolidation concerning for malignancy. Bronchoscopy planned for 12/09/19 while admitted but patient adamant about wanting to be discharged on 12/06/20. (She decided not to proceed with bronchoscopy on 12/08/20.). Also A1c 6.5% 12/05/20 with episodes of hypoglycemia, so DM regimen adjusted to glipizide 5 mg daily and metformin 500 mg BID (down from 1000 mg), and Actos stopped.   - [place] admission 12/11/20-12/14/20 for COPD exacerbation with underlying RML lung mass and ongoing  smoking. Improved with steroid taper. Out-patient pulmonology follow-up.  - [place] admission 01/10/21-01/13/21 with AKI, chest pain, failure to thrive. Hydrated with IVF. Quinapril held. HS Troponin 18->19->16 (flat). Echo from 3/25/22showed normal LVF with mild MS. Cardiology consulted Radford Pax, Tressia Miners, MD), and stress test ordered which was non-ischemic. Due to overall debility and memory/cognitive deficits she was discharged to SNF for short-term rehab. (ED visit 01/16/21 with recurrent chest pain, similar to before. Reproducible in ED. Troponins negative x2 with recent negative evaluation. CXR with known RML mass, small pleural effusions. Glucose 258. Cr 1.01. HGB down to 9 but no significant drop while in the ED. Discharged on Protonix with recommendation for PCP follow-up.)  She is a same day work-up, so labs/COVID test as indicated and anesthesia team evaluation on the day of surgery.    VS: Ht 5\' 4"  (1.626 m)   Wt 54.9 kg   BMI 20.77 kg/m   BP Readings from Last 3 Encounters:  01/16/21 (!) 122/52  01/14/21 116/61  01/13/21 (!) 111/59   Pulse Readings from Last 3 Encounters:  01/16/21 (!) 59  01/14/21 69  01/13/21 79    PROVIDERS: Lawerance Cruel, MD is PCP  Berniece Salines, DO is cardiologist Nicholas Lose, MD is HEM-ONC Eppie Gibson, MD is RAD-ONC   LABS: Lab results as of 01/16/21 include: Lab Results  Component Value Date   WBC 4.2 01/16/2021   HGB 9.0 (L) 01/16/2021   HCT 27.9 (L) 01/16/2021   PLT 226 01/16/2021   GLUCOSE 258 (H) 01/16/2021   CHOL 93 01/12/2021   TRIG 107 01/12/2021   HDL 35 (L) 01/12/2021   LDLCALC 37 01/12/2021  ALT 11 01/16/2021   AST 13 (L) 01/16/2021   NA 130 (L) 01/16/2021   K 3.6 01/16/2021   CL 97 (L) 01/16/2021   CREATININE 1.01 (H) 01/16/2021   BUN 12 01/16/2021   CO2 24 01/16/2021   HGBA1C 6.5 (H) 12/05/2020   CBC Latest Ref Rng & Units 01/16/2021 01/16/2021 01/11/2021  WBC 4.0 - 10.5 K/uL - 4.2 7.8  Hemoglobin 12.0 - 15.0 g/dL  9.0(L) 9.1(L) 11.4(L)  Hematocrit 36.0 - 46.0 % 27.9(L) 28.8(L) 35.0(L)  Platelets 150 - 400 K/uL - 226 273    IMAGES: CXR 01/16/21: FINDINGS: Right middle lobe lung mass redemonstrated, with extension to adjacent pleural surface. Small bilateral pleural effusions. Emphysema. Cardiomegaly with aortic atherosclerosis. No pneumothorax. Calcified granuloma in the right lung base. IMPRESSION: 1. Redemonstrated right middle lobe lung mass 2. Interim small bilateral pleural effusions. 3. Mild cardiomegaly.  Emphysema.  PET Scan 01/15/21: IMPRESSION: 1. Hypermetabolic mass in the RIGHT middle lobe consistent with primary bronchogenic carcinoma. Mass abuts the horizontal fissure and oblique fissure. 2. Mediastinal nodal metastasis to the subcarinal lymph node. 3. FDG PET staging: T3 N2 m0 4. No distant metastatic disease  CT Super D Chest 01/02/21: IMPRESSION: - Stable 5.2 cm spiculated mass in central right middle lobe, consistent with primary bronchogenic carcinoma. - No evidence of lymphadenopathy or pleural effusion. - Aortic Atherosclerosis (ICD10-I70.0).  MRI Brain 12/06/20: IMPRESSION: 1. Negative for metastatic disease or other cause of symptoms. 2. 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 3. Artifacts usually seen with recent iron infusion.   EKG:  EKG 01/16/21: Sinus rhythm Low voltage, precordial leads Probable anteroseptal infarct, old No acute changes No significant change since last tracing Confirmed by Varney Biles [phone]) on 01/16/2021 4:00:57 PM   CV: Nuclear stress test 01/12/21:  There was no ST segment deviation noted during stress.  No T wave inversion was noted during stress.  The study is normal.  This is a low risk study.  The left ventricular ejection fraction is hyperdynamic (>65%).  Nuclear stress EF: 83%. Normal stress nuclear study with no ischemia or infarction.  Gated ejection fraction 83% with normal wall  motion.   Echo 12/05/20: IMPRESSIONS  1. Left ventricular ejection fraction, by estimation, is 60 to 65%. The  left ventricle has normal function. The left ventricle has no regional  wall motion abnormalities. Left ventricular diastolic parameters are  consistent with Grade I diastolic  dysfunction (impaired relaxation). Elevated left atrial pressure.  2. Right ventricular systolic function is normal. The right ventricular  size is normal. Tricuspid regurgitation signal is inadequate for assessing  PA pressure.  3. The mitral valve is normal in structure. No evidence of mitral valve  regurgitation. Mild mitral stenosis. The mean mitral valve gradient is 3.5  mmHg. Moderate mitral annular calcification.  4. The aortic valve is normal in structure. Aortic valve regurgitation is  not visualized. Mild aortic valve sclerosis is present, with no evidence  of aortic valve stenosis.  5. The inferior vena cava is normal in size with greater than 50%  respiratory variability, suggesting right atrial pressure of 3 mmHg.    Past Medical History:  Diagnosis Date  . Anxiety   . Breast cancer of upper-outer quadrant of left female breast ([place]) 08/18/2015  . Broken wrist    when younger x2  . COPD (chronic obstructive pulmonary disease) [facility])    Hospitalization March 2011  . Depression   . Diabetes mellitus without complication ([place])    type  2  . Dyspnea   . Full dentures   . H/O vaginal delivery 1962   x1   . High cholesterol   . History of home oxygen therapy   . Hypertension   . Pneumonia   . Radiation    11/18/15- 12/16/15 to her Left Breast    Past Surgical History:  Procedure Laterality Date  . ABDOMINAL HYSTERECTOMY    . APPENDECTOMY    . CHOLECYSTECTOMY    . EYE SURGERY     cataracts  . RADIOACTIVE SEED GUIDED PARTIAL MASTECTOMY WITH AXILLARY SENTINEL LYMPH NODE BIOPSY Left 09/17/2015   Procedure: RADIOACTIVE SEED LOCALIZATION LEFT BREAST LUMPECTOMY AND LEFT AXILLARY  SENTINEL LYMPH NODE BIOPSY;  Surgeon: Excell Seltzer, MD;  Location: [facility];  Service: General;  Laterality: Left;  . TONSILLECTOMY      MEDICATIONS: No current facility-administered medications for this encounter.   Marland Kitchen albuterol (VENTOLIN HFA) 108 (90 Base) MCG/ACT inhaler  . anastrozole (ARIMIDEX) 1 MG tablet  . aspirin EC 81 MG tablet  . Cholecalciferol (VITAMIN D3) 5000 UNITS TABS  . metFORMIN (GLUCOPHAGE-XR) 500 MG 24 hr tablet  . nicotine (NICODERM CQ - DOSED IN MG/24 HOURS) 14 mg/24hr patch  . pantoprazole (PROTONIX) 40 MG tablet  . pentoxifylline (TRENTAL) 400 MG CR tablet  . pioglitazone (ACTOS) 45 MG tablet  . rosuvastatin (CRESTOR) 40 MG tablet  . Tiotropium Bromide-Olodaterol (STIOLTO RESPIMAT) 2.5-2.5 MCG/ACT AERS  . verapamil (CALAN-SR) 240 MG CR tablet    Myra Gianotti, PA-C Surgical Short Stay/Anesthesiology [facility] Phone [phone] [facility] Phone [phone] 01/21/2021 3:46 PM

## 2021-01-21 NOTE — Progress Notes (Signed)
Patient resides at [facility], spoke with Nurse Seth Bake, LPN  [phone] who states patient does not have any shortness of breath, fever, cough or chest pain.  PCP - Dr Unice Cobble Cardiologist - Berniece Salines, DO (saw on 01/01/21) Oncology - Dr Nicholas Lose  Chest x-ray - 01/16/21 (2V) EKG - 01/16/21 Stress Test - 01/12/21 ECHO - 12/05/20 Cardiac Cath - n/a  Fasting Blood Sugar - 200s - 300s Checks Blood Sugar 2 times a day  . Do not take metformin and actos diabetes medicines on the morning of surgery.  . If your blood sugar is less than 70 mg/dL, you will need to treat for low blood sugar: o Treat a low blood sugar (less than 70 mg/dL) with  cup of clear juice (cranberry or apple), 4 glucose tablets, OR glucose gel. o Recheck blood sugar in 15 minutes after treatment (to make sure it is greater than 70 mg/dL). If your blood sugar is not greater than 70 mg/dL on recheck, call [phone] for further instructions.  Anesthesia review: Yes  STOP now taking any Aspirin (unless otherwise instructed by your surgeon), Aleve, Naproxen, Ibuprofen, Motrin, Advil, Goody's, BC's, all herbal medications, fish oil, and all vitamins.   Coronavirus Screening Covid Test on DOS 01/23/11 Do you have any of the following symptoms:  Cough yes/no: No Fever (>100.65F)  yes/no: No Runny nose yes/no: No Sore throat yes/no: No Difficulty breathing/shortness of breath  yes/no: No  Have you traveled in the last 14 days and where? yes/no: No  Nurse Seth Bake verbalized understanding of instructions that were given via phone.  Instructions were also fax to [phone]

## 2021-01-21 NOTE — Telephone Encounter (Signed)
I received referral on Dawn [name].  I called and spoke to her son. Patient is scheduled to see Med onc and Rad onc next week.  01/29/21.

## 2021-01-22 ENCOUNTER — Ambulatory Visit (HOSPITAL_COMMUNITY)
Admission: RE | Admit: 2021-01-22 | Discharge: 2021-01-22 | Disposition: A | Discharge status: Skilled Nursing Facility | Payer: Medicare Other | Attending: Emergency Medicine | Admitting: Emergency Medicine

## 2021-01-22 ENCOUNTER — Encounter (HOSPITAL_COMMUNITY): Payer: Self-pay | Admitting: Emergency Medicine

## 2021-01-22 ENCOUNTER — Other Ambulatory Visit: Payer: Self-pay

## 2021-01-22 ENCOUNTER — Non-Acute Institutional Stay (SKILLED_NURSING_FACILITY): Payer: Medicare Other | Admitting: Adult Health

## 2021-01-22 ENCOUNTER — Ambulatory Visit (HOSPITAL_COMMUNITY): Payer: Medicare Other

## 2021-01-22 ENCOUNTER — Encounter (HOSPITAL_COMMUNITY)
Admission: RE | Disposition: A | Discharge status: Skilled Nursing Facility | Payer: Self-pay | Source: Home / Self Care | Attending: Emergency Medicine

## 2021-01-22 ENCOUNTER — Other Ambulatory Visit: Payer: Self-pay | Admitting: Emergency Medicine

## 2021-01-22 ENCOUNTER — Encounter: Payer: Self-pay | Admitting: Adult Health

## 2021-01-22 DIAGNOSIS — E7800 Pure hypercholesterolemia, unspecified: Secondary | ICD-10-CM | POA: Insufficient documentation

## 2021-01-22 DIAGNOSIS — F0390 Unspecified dementia without behavioral disturbance: Secondary | ICD-10-CM | POA: Insufficient documentation

## 2021-01-22 DIAGNOSIS — I10 Essential (primary) hypertension: Secondary | ICD-10-CM | POA: Insufficient documentation

## 2021-01-22 DIAGNOSIS — Z801 Family history of malignant neoplasm of trachea, bronchus and lung: Secondary | ICD-10-CM | POA: Diagnosis not present

## 2021-01-22 DIAGNOSIS — F17200 Nicotine dependence, unspecified, uncomplicated: Secondary | ICD-10-CM | POA: Insufficient documentation

## 2021-01-22 DIAGNOSIS — Z887 Allergy status to serum and vaccine status: Secondary | ICD-10-CM | POA: Insufficient documentation

## 2021-01-22 DIAGNOSIS — Z9049 Acquired absence of other specified parts of digestive tract: Secondary | ICD-10-CM | POA: Insufficient documentation

## 2021-01-22 DIAGNOSIS — Z90710 Acquired absence of both cervix and uterus: Secondary | ICD-10-CM | POA: Diagnosis not present

## 2021-01-22 DIAGNOSIS — Z7984 Long term (current) use of oral hypoglycemic drugs: Secondary | ICD-10-CM | POA: Diagnosis not present

## 2021-01-22 DIAGNOSIS — Z419 Encounter for procedure for purposes other than remedying health state, unspecified: Secondary | ICD-10-CM

## 2021-01-22 DIAGNOSIS — E1136 Type 2 diabetes mellitus with diabetic cataract: Secondary | ICD-10-CM | POA: Diagnosis not present

## 2021-01-22 DIAGNOSIS — Z538 Procedure and treatment not carried out for other reasons: Secondary | ICD-10-CM | POA: Insufficient documentation

## 2021-01-22 DIAGNOSIS — J189 Pneumonia, unspecified organism: Secondary | ICD-10-CM

## 2021-01-22 DIAGNOSIS — Z800 Family history of malignant neoplasm of digestive organs: Secondary | ICD-10-CM | POA: Insufficient documentation

## 2021-01-22 DIAGNOSIS — J449 Chronic obstructive pulmonary disease, unspecified: Secondary | ICD-10-CM | POA: Diagnosis not present

## 2021-01-22 DIAGNOSIS — U071 COVID-19: Secondary | ICD-10-CM

## 2021-01-22 DIAGNOSIS — Z888 Allergy status to other drugs, medicaments and biological substances status: Secondary | ICD-10-CM | POA: Insufficient documentation

## 2021-01-22 DIAGNOSIS — Z9012 Acquired absence of left breast and nipple: Secondary | ICD-10-CM | POA: Insufficient documentation

## 2021-01-22 DIAGNOSIS — Z79899 Other long term (current) drug therapy: Secondary | ICD-10-CM | POA: Diagnosis not present

## 2021-01-22 DIAGNOSIS — Z7982 Long term (current) use of aspirin: Secondary | ICD-10-CM | POA: Diagnosis not present

## 2021-01-22 DIAGNOSIS — R918 Other nonspecific abnormal finding of lung field: Secondary | ICD-10-CM | POA: Insufficient documentation

## 2021-01-22 DIAGNOSIS — Z853 Personal history of malignant neoplasm of breast: Secondary | ICD-10-CM | POA: Diagnosis not present

## 2021-01-22 DIAGNOSIS — J069 Acute upper respiratory infection, unspecified: Secondary | ICD-10-CM | POA: Diagnosis not present

## 2021-01-22 HISTORY — DX: Unspecified osteoarthritis, unspecified site: M19.90

## 2021-01-22 HISTORY — DX: Other amnesia: R41.3

## 2021-01-22 HISTORY — DX: COVID-19: U07.1

## 2021-01-22 HISTORY — DX: Anemia, unspecified: D64.9

## 2021-01-22 HISTORY — DX: Pneumonia, unspecified organism: J18.9

## 2021-01-22 LAB — RESP PANEL BY RT-PCR (FLU A&B, COVID) ARPGX2
Influenza A by PCR: NEGATIVE
Influenza B by PCR: NEGATIVE
SARS Coronavirus 2 by RT PCR: POSITIVE — AB

## 2021-01-22 SURGERY — CANCELLED PROCEDURE
Anesthesia: General

## 2021-01-22 NOTE — Progress Notes (Signed)
Per nurse Lyda Perone) at [facility], patient has not had anything to eat or drink since midnight and patient had all of her medications yesterday.

## 2021-01-22 NOTE — Discharge Summary (Signed)
Pulmonary Discharge Note  Mrs. Dawn [name] presented today for navigational bronchoscopy to evaluate a right middle lobe mass, endobronchial ultrasound to evaluate mediastinal adenopathy.  She reports feeling well although she has a chronic cough in the setting of COPD, the right middle lobe mass and her continued tobacco use. Screening COVID-19 testing was positive.  I feel that the safest approach here is to defer bronchoscopy given her significant COPD and the risks associated, particularly worsening of her respiratory status, dependence on mechanical ventilation.  We will try to reschedule when she has cleared COVID and we ensure respiratory stability.  Based on the outpatient algorithm for COVID treatment she meets criteria for referral for remdesivir.  I will make that referral now.  She will go back to [facility] for further care and to continue her PT/rehab. Helene Kelp has been made aware of her COVID-19 diagnosis.  Appropriate isolation precautions will be taken.  I will arrange for follow-up in office in 10+ days.  Baltazar Apo, MD, PhD 01/22/2021, 11:08 AM [facility] [phone] or if no answer before 7:00PM call [phone] For any issues after 7:00PM please call eLink [phone]

## 2021-01-22 NOTE — Progress Notes (Addendum)
CRITICAL RESULT PROVIDER NOTIFICATION  Test performed and critical result:  COVID positive  Date and time result received:  01/22/21, 0957  Provider name/title: Dr Baltazar Apo  Date and time provider notified: 10:29 AM  Date and time provider responded: 10:29 AM   Provider response:Procedure cancelled at this time

## 2021-01-22 NOTE — H&P (Signed)
Dawn [name] is an 80 y.o. female.   Chief Complaint: RML mass HPI:  80 year old active smoker with a history of breast cancer, hypertension, diabetes, anxiety and COPD.  Also dementia with fairly progressive worsening of memory loss over the last 6 months.  She was admitted in March and a CT chest identified a right middle lobe mass.  I have had an opportunity review the films with her and her son Dawn [name] and decision was made to pursue tissue diagnosis.  We initially scheduled the surgery for 01/05/2021 but then deferred in order to complete a full cardiology evaluation.  This was reassuring.  She presents today for navigational bronchoscopy with endobronchial ultrasound.  She does not remember our meeting, conversation regarding her CT.  This is her baseline neurological status.  Her son Dawn [name] is present, understands the issues, risk, benefits and agrees with the plan to proceed.  Past Medical History:  Diagnosis Date  . Anemia   . Anxiety   . Arthritis   . Breast cancer of upper-outer quadrant of left female breast ([place]) 08/18/2015  . Broken wrist    when younger x2  . COPD (chronic obstructive pulmonary disease) [facility])    Hospitalization March 2011  . Depression   . Diabetes mellitus without complication ([place])    type 2  . Dyspnea   . Full dentures   . H/O vaginal delivery 1962   x1   . High cholesterol   . History of home oxygen therapy   . Hypertension   . Memory changes    sharp decline over the last 3 months, per son as of 01/22/21,   . Pneumonia   . Radiation    11/18/15- 12/16/15 to her Left Breast    Past Surgical History:  Procedure Laterality Date  . ABDOMINAL HYSTERECTOMY    . APPENDECTOMY    . CHOLECYSTECTOMY    . EYE SURGERY     cataracts  . RADIOACTIVE SEED GUIDED PARTIAL MASTECTOMY WITH AXILLARY SENTINEL LYMPH NODE BIOPSY Left 09/17/2015   Procedure: RADIOACTIVE SEED LOCALIZATION LEFT BREAST LUMPECTOMY AND LEFT AXILLARY SENTINEL LYMPH NODE BIOPSY;  Surgeon: Excell Seltzer, MD;  Location: [place];  Service: General;  Laterality: Left;  . TONSILLECTOMY      Family History  Problem Relation Age of Onset  . Colon cancer Sister   . Lung cancer Sister    Social History:  reports that she has been smoking cigarettes. She has a 70.00 pack-year smoking history. She has never used smokeless tobacco. She reports current alcohol use. She reports that she does not use drugs.  Allergies:  Allergies  Allergen Reactions  . Ferumoxytol Shortness Of Breath, Itching and Other (See Comments)    Pt reported lightheadedness, a flushed feeling, SOB, and itching  . Pneumococcal Vaccines Hives, Swelling and Other (See Comments)    Arm became swollen  . Repaglinide Other (See Comments)    Dropped BGL too low    Medications Prior to Admission  Medication Sig Dispense Refill  . albuterol (VENTOLIN HFA) 108 (90 Base) MCG/ACT inhaler Inhale 2 puffs into the lungs every 2 (two) hours as needed for wheezing or shortness of breath. Please instruct in usage (Patient taking differently: Inhale 2 puffs into the lungs every 2 (two) hours as needed for wheezing or shortness of breath.) 1 each 2  . anastrozole (ARIMIDEX) 1 MG tablet Take 1 mg by mouth daily.    Marland Kitchen aspirin EC 81 MG tablet Take 81 mg by mouth daily.    Marland Kitchen  Cholecalciferol (VITAMIN D3) 5000 UNITS TABS Take 5,000 Units by mouth daily.    . metFORMIN (GLUCOPHAGE-XR) 500 MG 24 hr tablet Take 1 tablet (500 mg total) by mouth 2 (two) times daily. (Patient taking differently: Take 100 mg by mouth daily with breakfast.) 60 tablet 2  . pantoprazole (PROTONIX) 40 MG tablet Take 1 tablet (40 mg total) by mouth daily before breakfast. 30 tablet 0  . pentoxifylline (TRENTAL) 400 MG CR tablet Take 1 tablet (400 mg total) by mouth 3 (three) times daily with meals. (Patient taking differently: Take 800 mg by mouth in the morning.)    . pioglitazone (ACTOS) 45 MG tablet Take 45 mg by mouth daily.    . rosuvastatin (CRESTOR) 40 MG tablet  Take 40 mg by mouth daily.    . Tiotropium Bromide-Olodaterol (STIOLTO RESPIMAT) 2.5-2.5 MCG/ACT AERS Inhale 2 puffs into the lungs daily. 4 g 0  . verapamil (CALAN-SR) 240 MG CR tablet Take 240 mg by mouth daily.    . nicotine (NICODERM CQ - DOSED IN MG/24 HOURS) 14 mg/24hr patch Place 1 patch (14 mg total) onto the skin daily. 28 patch 0    No results found for this or any previous visit (from the past 48 hour(s)). No results found.  Review of Systems  Respiratory: Positive for cough.   Psychiatric/Behavioral: Positive for confusion.    Blood pressure (!) 120/99, pulse 71, temperature (!) 97.5 F (36.4 C), resp. rate 17, height 5\' 4"  (1.626 m), weight 54.9 kg, SpO2 91 %. Physical Exam  Gen: Pleasant but confused, thin elderly woman, in no distress,  normal affect  ENT: No lesions,  mouth clear,  oropharynx clear, no postnasal drip.  Cough with some upper airway secretions that she clears easily.  M3 airway  Neck: No JVD, no stridor  Lungs: Very distant, no wheezing  Cardiovascular: RRR, heart sounds normal, no murmur or gallops, no peripheral edema  Abdomen: soft and NT, no HSM,  BS normal  Musculoskeletal: No deformities, no cyanosis or clubbing  Neuro: alert, awake, non focal  Skin: Warm, no lesions or rashes   Assessment/Plan Right middle lobe mass, satellite pulmonary nodule with subcarinal lymphadenopathy in a lifelong smoker.  Most consistent with primary lung cancer.  She presents today for tissue diagnosis.  She had a reassuring cardiac evaluation, no evidence of an acute COPD exacerbation at this time.  Discussed the plan, risk, benefits with her son Dawn [name] who has signed her consent.  Plan for navigational bronchoscopy, endobronchial ultrasound to sample the subcarinal node.  Collene Gobble, MD 01/22/2021, 9:52 AM

## 2021-01-22 NOTE — Progress Notes (Signed)
Location:  [place] Room Number: 124 A Place of Service:  SNF (31) Provider:  Durenda Age, DNP, FNP-BC  Patient Care Team: Lawerance Cruel, MD as PCP - General (Family Medicine) Berniece Salines, DO as PCP - Cardiology (Cardiology) Excell Seltzer, MD (Inactive) as Consulting Physician (General Surgery) Nicholas Lose, MD as Consulting Physician (Hematology and Oncology) Arloa Koh, MD (Inactive) as Consulting Physician (Radiation Oncology) Sylvan Cheese, NP as Nurse Practitioner (Hematology and Oncology) Eppie Gibson, MD as Attending Physician (Radiation Oncology)  Extended Emergency Contact Information Primary Emergency Contact: Scarborough,Don          [place] [address] [address] of [place] Phone: [phone] Mobile Phone: [phone] Relation: Son  Code Status:   Full Code   Goals of care: Advanced Directive information Advanced Directives 01/22/2021  Does Patient Have a Medical Advance Directive? No  Type of Advance Directive -  Does patient want to make changes to medical advance directive? -  Copy of [place] in Chart? -  Would patient like information on creating a medical advance directive? No - Patient declined     Chief Complaint  Patient presents with  . Acute Visit    Positive for COVID-19    HPI:  Pt is a 80 y.o. female who tested positive for COVID-19 rapid test.  She was supposed to have bronchoscopy today due to right lung mass concerning for primary bronchogenic carcinoma. She has dry cough. She currently is on 2L/min via [place] with 97% O2 saturation. No reported fever nor chills. She had COVID-19 vaccination X 2.  She was admitted to [facility] on 01/13/21 post hospital admission 01/10/21 to 01/13/21. She has a PMH of longstanding tobacco abuse, 2 packs/day, COPD, type 2 diabetes mellitus, hypertension, dyslipidemia, history of breast cancer with known  right hilar lung mass concerning for bronchogenic carcinoma-pending cardiology evaluation for bronchoscopy and biopsy.  She presented to ER on 01/10/21 with generalized weakness, ongoing intermittent chest pressure with exertion and overall failure to thrive.  Work-up in the ED showed creatinine of 1.5, which is higher than baseline less than 1, high-sensitivity troponin was in the high normal range, chest x-ray noted with right middle lobe mass. Chest pain was substernal and worse with exertion.  EKG without acute findings, echocardiogram in March was unremarkable with preserved LVEF and wall motion.  Cardiology was consulted and underwent Lexiscan Myoview which was low risk, negative for ischemia or infarction.  No further cardiac work-up recommended.  Quinapril was discontinued due to elevated creatinine of 1.5.  Past Medical History:  Diagnosis Date  . Anemia   . Anxiety   . Arthritis   . Breast cancer of upper-outer quadrant of left female breast ([facility]) 08/18/2015  . Broken wrist    when younger x2  . COPD (chronic obstructive pulmonary disease) [facility])    Hospitalization March 2011  . Depression   . Diabetes mellitus without complication ([facility])    type 2  . Dyspnea   . Full dentures   . H/O vaginal delivery 1962   x1   . High cholesterol   . History of home oxygen therapy   . Hypertension   . Memory changes    sharp decline over the last 3 months, per son as of 01/22/21,   . Pneumonia   . Radiation    11/18/15- 12/16/15 to her Left Breast   Past Surgical History:  Procedure Laterality Date  . ABDOMINAL HYSTERECTOMY    .  APPENDECTOMY    . CHOLECYSTECTOMY    . EYE SURGERY     cataracts  . RADIOACTIVE SEED GUIDED PARTIAL MASTECTOMY WITH AXILLARY SENTINEL LYMPH NODE BIOPSY Left 09/17/2015   Procedure: RADIOACTIVE SEED LOCALIZATION LEFT BREAST LUMPECTOMY AND LEFT AXILLARY SENTINEL LYMPH NODE BIOPSY;  Surgeon: Excell Seltzer, MD;  Location: [facility];  Service: General;  Laterality: Left;  .  TONSILLECTOMY      Allergies  Allergen Reactions  . Ferumoxytol Shortness Of Breath, Itching and Other (See Comments)    Pt reported lightheadedness, a flushed feeling, SOB, and itching  . Pneumococcal Vaccines Hives, Swelling and Other (See Comments)    Arm became swollen  . Repaglinide Other (See Comments)    Dropped BGL too low    Outpatient Encounter Medications as of 01/22/2021  Medication Sig  . albuterol (VENTOLIN HFA) 108 (90 Base) MCG/ACT inhaler Inhale 2 puffs into the lungs every 2 (two) hours as needed for wheezing or shortness of breath. Please instruct in usage (Patient taking differently: Inhale 2 puffs into the lungs every 2 (two) hours as needed for wheezing or shortness of breath.)  . anastrozole (ARIMIDEX) 1 MG tablet Take 1 mg by mouth daily.  Marland Kitchen aspirin EC 81 MG tablet Take 81 mg by mouth daily.  . bisacodyl (DULCOLAX) 10 MG suppository Place 10 mg rectally as needed for moderate constipation.  Marland Kitchen dexamethasone (DECADRON) 4 MG tablet Take 4 mg by mouth 2 (two) times daily with a meal.  . guaiFENesin (ROBITUSSIN) 100 MG/5ML SOLN Take 5 mLs by mouth every 6 (six) hours as needed for cough or to loosen phlegm.  . Magnesium Hydroxide (MILK OF MAGNESIA PO) Take 30 mLs by mouth as needed.  . metFORMIN (GLUCOPHAGE-XR) 500 MG 24 hr tablet Take 1 tablet (500 mg total) by mouth 2 (two) times daily. (Patient taking differently: Take 100 mg by mouth daily with breakfast.)  . nicotine (NICODERM CQ - DOSED IN MG/24 HOURS) 14 mg/24hr patch Place 1 patch (14 mg total) onto the skin daily.  . pantoprazole (PROTONIX) 40 MG tablet Take 1 tablet (40 mg total) by mouth daily before breakfast.  . pentoxifylline (TRENTAL) 400 MG CR tablet Take 1 tablet (400 mg total) by mouth 3 (three) times daily with meals. (Patient taking differently: Take 800 mg by mouth in the morning.)  . pioglitazone (ACTOS) 45 MG tablet Take 45 mg by mouth daily.  . rosuvastatin (CRESTOR) 40 MG tablet Take 40 mg by  mouth daily.  . Sodium Phosphates (RA SALINE ENEMA) 19-7 GM/118ML ENEM Place rectally as needed.  . Tiotropium Bromide-Olodaterol (STIOLTO RESPIMAT) 2.5-2.5 MCG/ACT AERS Inhale 2 puffs into the lungs daily.  . verapamil (CALAN-SR) 240 MG CR tablet Take 240 mg by mouth daily.  . vitamin C (ASCORBIC ACID) 500 MG tablet Take 500 mg by mouth daily.  Marland Kitchen zinc sulfate 220 (50 Zn) MG capsule Take 220 mg by mouth daily.  . [DISCONTINUED] Cholecalciferol (VITAMIN D3) 5000 UNITS TABS Take 5,000 Units by mouth daily.   No facility-administered encounter medications on file as of 01/22/2021.    Review of Systems  GENERAL: No fever or chills  MOUTH and THROAT: Denies oral discomfort, gingival pain or bleeding RESPIRATORY: no cough, SOB, DOE, wheezing, hemoptysis CARDIAC: No chest pain, edema or palpitations GI: No abdominal pain, diarrhea, constipation, heart burn, nausea or vomiting GU: Denies dysuria, frequency, hematuria, incontinence, or discharge NEUROLOGICAL: Denies dizziness, syncope, numbness, or headache PSYCHIATRIC: Denies feelings of depression or anxiety. No report of hallucinations,  insomnia, paranoia, or agitation    Immunization History  Administered Date(s) Administered  . Influenza Split 06/27/2009  . Influenza, High Dose Seasonal PF 07/19/2018, 06/05/2019, 06/06/2020  . Influenza,inj,Quad PF,6+ Mos 06/06/2017  . Influenza,inj,quad, With Preservative 06/20/2014, 06/04/2016  . Influenza-Unspecified 07/10/2012, 05/24/2013, 05/07/2015  . PFIZER(Purple Top)SARS-COV-2 Vaccination 03/15/2020, 04/05/2020   Pertinent  Health Maintenance Due  Topic Date Due  . FOOT EXAM  Never done  . OPHTHALMOLOGY EXAM  Never done  . URINE MICROALBUMIN  Never done  . PNA vac Low Risk Adult (1 of 2 - PCV13) Never done  . INFLUENZA VACCINE  04/13/2021  . HEMOGLOBIN A1C  06/07/2021  . DEXA SCAN  Completed   Fall Risk  01/16/2016 10/15/2015  Falls in the past year? Yes No  Comment she fell two weeks ago  and doesn't remember how it happened -  Number falls in past yr: 1 -  Injury with Fall? Yes -  Comment she has some bruises to her arms -  Risk Factor Category  High Fall Risk -  Risk for fall due to : History of fall(s) -  Follow up Falls evaluation completed -     Vitals:   01/22/21 1632  BP: 106/69  Pulse: 69  Resp: 15  Temp: 97.7 F (36.5 C)  Weight: 118 lb 3.2 oz (53.6 kg)  Height: 5\' 4"  (1.626 m)   Body mass index is 20.29 kg/m.  Physical Exam  GENERAL APPEARANCE: Well nourished. In no acute distress. Normal body habitus SKIN:  Skin is warm and dry.  MOUTH and THROAT: Lips are without lesions. Oral mucosa is moist and without lesions. T RESPIRATORY: Breathing is even & unlabored CARDIAC: RRR, no murmur,no extra heart sounds, no edema GI: Abdomen soft, normal BS, no masses, no tenderness EXTREMITIES:  Able to move X 4 extremities NEUROLOGICAL: There is no tremor. Speech is clear. Alert and oriented X 3. PSYCHIATRIC:  Affect and behavior are appropriate  Labs reviewed: Recent Labs    12/11/20 2145 12/12/20 0454 01/11/21 0305 01/12/21 0626 01/16/21 1517  NA  --    < > 136 132* 130*  K  --    < > 3.5 3.7 3.6  CL  --    < > 98 98 97*  CO2  --    < > 30 25 24   GLUCOSE  --    < > 126* 203* 258*  BUN  --    < > 13 11 12   CREATININE  --    < > 1.45* 1.55* 1.01*  CALCIUM  --    < > 8.7* 8.6* 8.5*  MG 1.5*  --  1.5*  --   --    < > = values in this interval not displayed.   Recent Labs    01/10/21 1551 01/11/21 0305 01/16/21 1517  AST 12* 12* 13*  ALT 9 9 11   ALKPHOS 81 64 59  BILITOT 0.5 0.4 0.4  PROT 5.8* 4.9* 4.8*  ALBUMIN 2.7* 2.3* 2.2*   Recent Labs    01/01/21 1633 01/10/21 1551 01/11/21 0305 01/16/21 1517 01/16/21 2032  WBC 11.8* 8.9 7.8 4.2  --   NEUTROABS 11.1*  --   --  2.8  --   HGB 13.2 12.5 11.4* 9.1* 9.0*  HCT 40.7 39.0 35.0* 28.8* 27.9*  MCV 82.6 83.0 82.0 86.0  --   PLT 453* 326 273 226  --    Lab Results  Component Value  Date   TSH  11/25/2009    0.659 (NOTE) Please note change in reference ranges for ages 32W to 52Y.Test methodology is 3rd generation TSH   Lab Results  Component Value Date   HGBA1C 6.5 (H) 12/05/2020   Lab Results  Component Value Date   CHOL 93 01/12/2021   HDL 35 (L) 01/12/2021   LDLCALC 37 01/12/2021   TRIG 107 01/12/2021   CHOLHDL 2.7 01/12/2021    Significant Diagnostic Results in last 30 days:  DG Chest 2 View  Result Date: 01/16/2021 CLINICAL DATA:  Chest pain EXAM: CHEST - 2 VIEW COMPARISON:  01/10/2021 radiograph and chest CT, chest x-ray 12/04/2020 FINDINGS: Right middle lobe lung mass redemonstrated, with extension to adjacent pleural surface. Small bilateral pleural effusions. Emphysema. Cardiomegaly with aortic atherosclerosis. No pneumothorax. Calcified granuloma in the right lung base. IMPRESSION: 1. Redemonstrated right middle lobe lung mass 2. Interim small bilateral pleural effusions. 3. Mild cardiomegaly.  Emphysema. Electronically Signed   By: Donavan Foil M.D.   On: 01/16/2021 16:07   DG Chest 2 View  Result Date: 01/10/2021 CLINICAL DATA:  Chest pain, short of breath, right lung mass EXAM: CHEST - 2 VIEW COMPARISON:  01/02/2021, 01/01/2021 FINDINGS: Frontal and lateral views of the chest demonstrate an unremarkable cardiac silhouette. Stable right middle lobe mass consistent with likely bronchogenic malignancy based on previous CT findings. No new airspace disease, effusion, or pneumothorax. No acute bony abnormalities. IMPRESSION: 1. Stable right middle lobe mass unchanged since previous CT, most consistent with lung cancer. 2. Otherwise no acute intrathoracic process. Electronically Signed   By: Randa Ngo M.D.   On: 01/10/2021 16:00   Abd 1 View (KUB)  Result Date: 01/10/2021 CLINICAL DATA:  Initial evaluation for acute kidney injury, lung mass. EXAM: ABDOMEN - 1 VIEW COMPARISON:  Prior MRI from 03/17/2020. FINDINGS: Bowel gas pattern within normal limits  without obstruction or ileus. No appreciable abnormal bowel wall thickening. No visible free air on this limited supine view the abdomen. No soft tissue mass or abnormal calcification. Cholecystectomy clips overlie the right upper quadrant. Additional surgical clip present at the left hemipelvis. Degenerative spondylosis noted within the lower lumbar spine. No acute osseous finding. Visualized lung bases are grossly clear. IMPRESSION: Nonobstructive bowel gas pattern with no radiographic evidence for acute intra-abdominal pathology. Electronically Signed   By: Jeannine Boga M.D.   On: 01/10/2021 23:19   US RENAL  Result Date: 01/11/2021 CLINICAL DATA:  Acute kidney injury EXAM: RENAL / URINARY TRACT ULTRASOUND COMPLETE COMPARISON:  Ultrasound dated March 12, 2020.  MRI dated February 16, 2020 FINDINGS: Right Kidney: Renal measurements: 10.4.1 x 6.5 cm = volume: 150 mL. There is no hydronephrosis. The echogenicity is increased. There is a 1.5 cm hypoechoic lesion in the upper pole that is favored to represent a cyst. Left Kidney: Renal measurements: 12.1 x 3.7 x 4.3 cm = volume: 101.4 mL. The echogenicity is increased. There is no hydronephrosis. There is a prominent extrarenal pelvis. Again noted is a cystic appearing lesion involving the interpolar region measuring approximately 1.6 cm Bladder: There is a questionable filling defect within the urinary bladder near the right UVJ. Other: None. IMPRESSION: 1. No hydronephrosis. 2. Echogenic kidneys bilaterally which can be seen in patients with medical renal disease. 3. Questionable filling defect in the urinary bladder at the level of the right UVJ. Outpatient urology follow-up is recommended for this finding. Electronically Signed   By: Constance Holster M.D.   On: 01/11/2021 00:01   NM Myocar Multi W/Spect  W/Wall Motion / EF  Result Date: 01/12/2021  There was no ST segment deviation noted during stress.  No T wave inversion was noted during stress.  The  study is normal.  This is a low risk study.  The left ventricular ejection fraction is hyperdynamic (>65%).  Nuclear stress EF: 83%.  Normal stress nuclear study with no ischemia or infarction.  Gated ejection fraction 83% with normal wall motion.   NM PET Image Initial (PI) Skull Base To Thigh  Result Date: 01/16/2021 CLINICAL DATA:  Initial treatment strategy for lung mass. EXAM: NUCLEAR MEDICINE PET SKULL BASE TO THIGH TECHNIQUE: 5.8 mCi F-18 FDG was injected intravenously. Full-ring PET imaging was performed from the skull base to thigh after the radiotracer. CT data was obtained and used for attenuation correction and anatomic localization. Fasting blood glucose: 250 mg/dl COMPARISON:  01/02/2021 FINDINGS: Mediastinal blood pool activity: SUV max 2.2 Liver activity: SUV max NA NECK: No hypermetabolic lymph nodes in the neck. Incidental CT findings: none CHEST: Lobulated mass in the RIGHT middle lobe measures 6.5 x 4.1 cm with intense metabolic activity (SUV max equal 12.2). Mass abuts the horizontal fissure and oblique fissure. Hypermetabolic subcarinal lymph node measures 12 mm short axis with SUV max equal 7.1 no paratracheal adenopathy. No supraclavicular adenopathy. Incidental CT findings: Coronary artery calcification and aortic atherosclerotic calcification. Calcified hilar nodes and calcified pulmonary granulomas. ABDOMEN/PELVIS: No abnormal hypermetabolic activity within the liver, pancreas, adrenal glands, or spleen. No hypermetabolic lymph nodes in the abdomen or pelvis. Incidental CT findings: Atherosclerotic calcification of the aorta. Postcholecystectomy. Hysterectomy. SKELETON: No focal hypermetabolic activity to suggest skeletal metastasis. Incidental CT findings: none IMPRESSION: 1. Hypermetabolic mass in the RIGHT middle lobe consistent with primary bronchogenic carcinoma. Mass abuts the horizontal fissure and oblique fissure. 2. Mediastinal nodal metastasis to the subcarinal lymph node.  3. FDG PET staging: T3 N2 m0 4. No distant metastatic disease Electronically Signed   By: Suzy Bouchard M.D.   On: 01/16/2021 09:43   DG Chest Port 1 View  Result Date: 01/01/2021 CLINICAL DATA:  Chest pain and shortness of breath. EXAM: PORTABLE CHEST 1 VIEW COMPARISON:  12/25/2020. FINDINGS: Stable parenchymal density and adjacent pleural tenting in the right lower lung zone, shown by previous CT to be highly suspicious for malignancy. The remainder of the lungs are clear. Stable borderline enlarged cardiac silhouette. Stable left breast surgical clips. Diffuse osteopenia. IMPRESSION: 1. Stable previously demonstrated changes in the right lung suspicious for malignancy. 2. No acute abnormality. Electronically Signed   By: Claudie Revering M.D.   On: 01/01/2021 17:01   CT Super D Chest Wo Contrast  Result Date: 01/04/2021 CLINICAL DATA:  Follow-up right lung mass. EXAM: CT CHEST WITHOUT CONTRAST TECHNIQUE: Multidetector CT imaging of the chest was performed using thin slice collimation for electromagnetic bronchoscopy planning purposes, without intravenous contrast. COMPARISON:  12/04/2020 FINDINGS: Cardiovascular: No acute findings. Aortic and coronary atherosclerotic calcification noted. Mediastinum/Nodes: No pathologically enlarged lymph nodes identified on this unenhanced exam. Mediastinal and hilar lymph node calcifications are seen, consistent with old granulomatous disease. Lungs/Pleura: Persistent spiculated mass is seen in the central right middle lobe, which abuts the minor and major fissures and the right hilum. This measures 5.2 x 3.5 cm on image 88/3, without significant change compared to prior study. No other suspicious pulmonary nodules or masses are identified. No evidence of pleural effusion. Upper Abdomen:  Unremarkable. Musculoskeletal:  No suspicious bone lesions. IMPRESSION: Stable 5.2 cm spiculated mass in central right middle lobe,  consistent with primary bronchogenic carcinoma. No  evidence of lymphadenopathy or pleural effusion. Aortic Atherosclerosis (ICD10-I70.0). Electronically Signed   By: Marlaine Hind M.D.   On: 01/04/2021 13:03    Assessment/Plan  1. Real time reverse transcriptase PCR positive for COVID-19 virus -    Start Decadron 4 mg daily x7 days, vitamin C 500 mg twice daily x7 days, repeat this in 100 mg / 5 mL give 1 mL every 6 hours x5 days and zinc sulfate 220 mg daily x7 days -  Will transfer to isolation in-room -  All services provided in the room  2. Mass of right lung -  Concerning for primary bronchogenic carcinoma -   Supposedly for bronchoscopy today -  Will follow up with Dr. Lamonte Sakai, pulmonary, after COVID-19 isolation  3.  COPD -   No wheezing -  Continue O2 @ 2L/min via [place] -  Continue Stiolto Respimat inhaler, PRN Albuterol     Family/ staff Communication: Discussed plan of care with resident and charge nurse.  Labs/tests ordered: CBC with differential, BMP, C-reactive protein, D-dimer, chest x-ray PA and lateral  Goals of care:   Short-term care   Durenda Age, DNP, MSN, FNP-BC [facility] [phone] (Monday-Friday 8:00 a.m. - 5:00 p.m.) [phone] (after hours)

## 2021-01-22 NOTE — Progress Notes (Addendum)
Attempted to contact [facility] for clarification on if patient will be allowed to return with COVID positive diagnosis.  Phone rings and rings with no answer.  Will continue to call  10:39 AM left message for Nursing Director Renotta at [facility] for her to return call  10:53 AM Attempted to contact [facility] main number   11:02 AM Spoke to [place] with [facility].  Patient is ok to come back to [facility] with COVID diagnosis. Patient will get antiviral tx per Dr. Lamonte Sakai.  Dr Lamonte Sakai made aware of last COVID test from [facility] and that patient is ok to return there.        Patients last Covid test at [facility] 01/12/21 Negative  Patients last COVID test at [facility] - 01/12/21 Patients Positive Covid test 01/22/21

## 2021-01-22 NOTE — Progress Notes (Signed)
Spoke to patients Son Dawn [name] who helps manage her care.  Patient is currently staying at [facility] for her care.  Dawn [name] stated that patients memory has rapidly declined over the last 3 months but before that she would forget the occasional things.  Specifically where her granddaughter lives.  Son stated that his plan for her to be discharged back to [facility] with eventually to move in with him once he is able to get his home safe for her to stay in .  Patient stated that she is not sure how she got to [facility] today,  Her Son did bring her this morning to have her procedure completed.   I assessed patients cognitive status today.  Patient is able to tell me her name, birthday, where she is, who the president is, the month but not the day, who her PCP is.  She then asked where she would be on the 15th and I asked what was happening on the 15th, and she said it was her sons birthday and she wanted to celebrate with him.  When Patient was asked why she was here at [facility] today. She stated that she doesn't know why she is here and that no one has told her anything.  I explained that Dr. Lamonte Sakai would be by soon to talk with her about everything today.    I contacted Dr Lamonte Sakai to discuss my findings and to discuss her cognitive impairment.  Dr. Lamonte Sakai stated that he is aware of her memory impairment and that she has Dementia.  He stated that her Son Dawn [name] has been with her for all the appointments and that he has been making the patients medical decisions.  Per Dawn [name] patient does not have a POA/Living will as far as he knew.  Dawn [name] is not present this morning. Encouraged him to come to be with his mom today in hopes that it will help with her cognition.  Patient has threatened to leave since she doesn't know why she is here today.    Dr Royce Macadamia made aware.  Will continue to monitor patient to make sure she is safe and stays in her room.

## 2021-01-22 NOTE — Progress Notes (Signed)
PCCM Interval Note  Advised that the patient was positive for COVID on her screen today.  She does have cough, sputum but unable to really determine whether this is acute or chronic.  She does not give a good history due to her dementia.  She has a negative COVID 10 days ago in our system, probably has had COVID testing in the interim at [facility].  We will try to figure out the timing, determine in which window she became positive at this may impact suitability for outpatient antiviral therapy.  Given her severe COPD, continued smoking.  I feel that there would be undue risk performing mechanical ventilation in bronchoscopy given her positive COVID result.  This changes her risk profile.  I discussed this with her son [name] at bedside.    We will need to determine whether [facility] is able to accommodate her with the new test result so that she can return. Depending on whether she gets admitted, I may be able to reschedule bronchoscopy even if she is inside the 10-day isolation window if her symptoms, disease burden does not preclude.  Alternatively we will have to wait and reschedule as an outpatient once all of this clears.   Baltazar Apo, MD, PhD 01/22/2021, 10:40 AM [facility] [phone] or if no answer before 7:00PM call [phone] For any issues after 7:00PM please call eLink [phone]

## 2021-01-23 ENCOUNTER — Telehealth: Payer: Self-pay

## 2021-01-23 NOTE — Telephone Encounter (Signed)
Per chart, this was already addressed.

## 2021-01-23 NOTE — Telephone Encounter (Signed)
Called to discuss with patient about COVID-19 symptoms and the use of one of the available treatments for those with mild to moderate Covid symptoms and at a high risk of hospitalization.  Pt appears to qualify for outpatient treatment due to co-morbid conditions and/or a member of an at-risk group in accordance with the FDA Emergency Use Authorization.    Symptom onset: No symptoms Vaccinated: Yes Booster? No Immunocompromised?  Qualifiers: COPD,DM,HTN NIH Criteria:Tier 1  Son reports pt. Does not have any symptoms.  Marcello Moores

## 2021-01-26 ENCOUNTER — Telehealth: Payer: Self-pay | Admitting: Emergency Medicine

## 2021-01-26 NOTE — Telephone Encounter (Signed)
Pt's ENB was cancelled due to her positive COVID test. Will need to reschedule when able.

## 2021-01-27 ENCOUNTER — Telehealth: Payer: Self-pay | Admitting: Internal Medicine

## 2021-01-27 NOTE — Telephone Encounter (Signed)
Spoke to [name] to confirm Covid dx for mother, he advised she doesn't have symptoms and has been vaccinated, advised we would confirm with Dr. Julien Nordmann if they can come into office or if a phone call will be okay.

## 2021-01-28 ENCOUNTER — Ambulatory Visit: Payer: Medicare Other | Admitting: Cardiology

## 2021-01-29 ENCOUNTER — Ambulatory Visit
Admission: RE | Admit: 2021-01-29 | Discharge: 2021-01-29 | Disposition: A | Discharge status: Home / Self Care | Payer: Medicare Other | Source: Ambulatory Visit | Attending: Radiation Oncology | Admitting: Radiation Oncology

## 2021-01-29 ENCOUNTER — Inpatient Hospital Stay: Payer: Medicare Other | Attending: Internal Medicine | Admitting: Internal Medicine

## 2021-01-29 ENCOUNTER — Inpatient Hospital Stay: Payer: Medicare Other

## 2021-01-29 ENCOUNTER — Other Ambulatory Visit: Payer: Self-pay | Admitting: *Deleted

## 2021-01-29 ENCOUNTER — Other Ambulatory Visit: Payer: Self-pay

## 2021-01-29 DIAGNOSIS — C3491 Malignant neoplasm of unspecified part of right bronchus or lung: Secondary | ICD-10-CM

## 2021-01-29 DIAGNOSIS — F17210 Nicotine dependence, cigarettes, uncomplicated: Secondary | ICD-10-CM | POA: Diagnosis not present

## 2021-01-29 DIAGNOSIS — R911 Solitary pulmonary nodule: Secondary | ICD-10-CM | POA: Diagnosis not present

## 2021-01-29 DIAGNOSIS — Z801 Family history of malignant neoplasm of trachea, bronchus and lung: Secondary | ICD-10-CM

## 2021-01-29 DIAGNOSIS — Z800 Family history of malignant neoplasm of digestive organs: Secondary | ICD-10-CM | POA: Diagnosis not present

## 2021-01-29 DIAGNOSIS — R059 Cough, unspecified: Secondary | ICD-10-CM

## 2021-01-29 DIAGNOSIS — Z79899 Other long term (current) drug therapy: Secondary | ICD-10-CM

## 2021-01-29 DIAGNOSIS — R630 Anorexia: Secondary | ICD-10-CM

## 2021-01-29 DIAGNOSIS — R590 Localized enlarged lymph nodes: Secondary | ICD-10-CM | POA: Diagnosis not present

## 2021-01-29 DIAGNOSIS — R0600 Dyspnea, unspecified: Secondary | ICD-10-CM

## 2021-01-29 DIAGNOSIS — R918 Other nonspecific abnormal finding of lung field: Secondary | ICD-10-CM

## 2021-01-29 DIAGNOSIS — R5383 Other fatigue: Secondary | ICD-10-CM | POA: Diagnosis not present

## 2021-01-29 NOTE — Progress Notes (Signed)
The proposed treatment discussed in cancer conference is for discussion purpose only and is not a binding recommendation. The patient was not physically examined nor present for their treatment options. Therefore, final treatment plans cannot be decided.  ?

## 2021-01-29 NOTE — Progress Notes (Signed)
[place] [phone]   Fax:([phone]) [place], [place], [place] [address]  I connected with@ on 01/29/21 at  2:00 PM EDT by telephone visit and verified that I am speaking with the correct person using two identifiers.   I discussed the limitations, risks, security and privacy concerns of performing an evaluation and management service by telemedicine and the availability of in-person appointments. I also discussed with the patient that there may be a patient responsible charge related to this service. The patient expressed understanding and agreed to proceed.  Other persons participating in the visit and their role in the encounter: Son, [name]  Patient's location: [facility] Provider's location: [facility]   REFERRING PHYSICIAN: Dr. Baltazar Apo  REASON FOR CONSULTATION:  80 years old white female with likely lung cancer.  HPI ROSEALYNN MATEUS is a 80 y.o. female with past medical history significant for anemia, COPD, hypertension, diabetes mellitus, depression, dyslipidemia, history of pneumonia as well as history of left breast cancer diagnosed in December 2016 status postlumpectomy followed by adjuvant radiotherapy as well as hormonal therapy under the care of Dr. Lindi Adie.  The patient has been complaining of progressive memory loss as well as shortness of breath and weakness.  She was seen at the emergency department and during her evaluation she was found to have significant anemia with hemoglobin down to 7.0.  She received PRBCs transfusion.  During her evaluation she had chest x-ray on December 04, 2020 and it showed suspicious right middle lobe opacities concerning for pneumonia.  CT scan of the chest was performed on December 01, 2020 and that showed a 6.0 x 3.3 cm low-attenuation right middle lobe masslike consolidation extending from the right hilum to  the lateral pleural surface along the fissure most concerning for malignancy.  MRI of the brain performed on December 06, 2020 showed no evidence of metastatic disease to the brain.  The patient was referred to Dr. Lamonte Sakai.  She was supposed to have bronchoscopy in April 2022 but this was postponed for cardiac evaluation.  A PET scan was performed on Jan 15, 2021 and that showed lobulated mass in the right middle lobe measuring 6.5 x 4.1 cm with intense metabolic activity and SUV equal 12.2.  The mass abuts the horizontal fissure and oblique fissure.  There was hypermetabolic subcarinal lymph node measuring 1.2 cm in short axis with SUV equal 7.1.  There was no paratracheal adenopathy and no supraclavicular lymphadenopathy or distant metastatic disease. The patient was scheduled to have repeat bronchoscopy on Jan 22, 2021 but the preoperative COVID test was positive and the procedure was again delayed. [facility] is [facility] and undergoing physical therapy as well as occupational therapy and feeling better. She had the telephone virtual visit with [place] today for evaluation and recommendation regarding her condition.   Her son [name] was very helpful with the communication during the visit today.  The patient continues to have chest and stomach pain.  She is forgetful.  She has cough with mild shortness of breath and she is currently on home oxygen.  She denied having any nausea, vomiting, diarrhea or constipation.  She has no headache or visual changes.  HPI  Past Medical History:  Diagnosis Date  . Anemia   . Anxiety   . Arthritis   . Breast cancer of upper-outer quadrant of left female  breast ([place]) 08/18/2015  . Broken wrist    when younger x2  . COPD (chronic obstructive pulmonary disease) [facility])    Hospitalization March 2011  . Depression   . Diabetes mellitus without complication ([place])    type 2  . Dyspnea   . Full dentures   . H/O vaginal delivery 1962   x1    . High cholesterol   . History of home oxygen therapy   . Hypertension   . Memory changes    sharp decline over the last 3 months, per son as of 01/22/21,   . Pneumonia   . Radiation    11/18/15- 12/16/15 to her Left Breast    Past Surgical History:  Procedure Laterality Date  . ABDOMINAL HYSTERECTOMY    . APPENDECTOMY    . CHOLECYSTECTOMY    . EYE SURGERY     cataracts  . RADIOACTIVE SEED GUIDED PARTIAL MASTECTOMY WITH AXILLARY SENTINEL LYMPH NODE BIOPSY Left 09/17/2015   Procedure: RADIOACTIVE SEED LOCALIZATION LEFT BREAST LUMPECTOMY AND LEFT AXILLARY SENTINEL LYMPH NODE BIOPSY;  Surgeon: Excell Seltzer, MD;  Location: [facility];  Service: General;  Laterality: Left;  . TONSILLECTOMY      Family History  Problem Relation Age of Onset  . Colon cancer Sister   . Lung cancer Sister     Social History Social History   Tobacco Use  . Smoking status: Current Every Day Smoker    Packs/day: 1.00    Years: 70.00    Pack years: 70.00    Types: Cigarettes  . Smokeless tobacco: Never Used  . Tobacco comment: has not smoked since been at [facility] as of 01/22/21  Vaping Use  . Vaping Use: Never used  Substance Use Topics  . Alcohol use: Yes    Comment: rare  . Drug use: No    Allergies  Allergen Reactions  . Ferumoxytol Shortness Of Breath, Itching and Other (See Comments)    Pt reported lightheadedness, a flushed feeling, SOB, and itching  . Pneumococcal Vaccines Hives, Swelling and Other (See Comments)    Arm became swollen  . Repaglinide Other (See Comments)    Dropped BGL too low    Current Outpatient Medications  Medication Sig Dispense Refill  . albuterol (VENTOLIN HFA) 108 (90 Base) MCG/ACT inhaler Inhale 2 puffs into the lungs every 2 (two) hours as needed for wheezing or shortness of breath. Please instruct in usage (Patient taking differently: Inhale 2 puffs into the lungs every 2 (two) hours as needed for wheezing or shortness of breath.) 1 each 2  . anastrozole  (ARIMIDEX) 1 MG tablet Take 1 mg by mouth daily.    Marland Kitchen aspirin EC 81 MG tablet Take 81 mg by mouth daily.    . bisacodyl (DULCOLAX) 10 MG suppository Place 10 mg rectally as needed for moderate constipation.    Marland Kitchen dexamethasone (DECADRON) 4 MG tablet Take 4 mg by mouth 2 (two) times daily with a meal.    . guaiFENesin (ROBITUSSIN) 100 MG/5ML SOLN Take 5 mLs by mouth every 6 (six) hours as needed for cough or to loosen phlegm.    . Magnesium Hydroxide (MILK OF MAGNESIA PO) Take 30 mLs by mouth as needed.    . metFORMIN (GLUCOPHAGE-XR) 500 MG 24 hr tablet Take 1 tablet (500 mg total) by mouth 2 (two) times daily. (Patient taking differently: Take 100 mg by mouth daily with breakfast.) 60 tablet 2  . nicotine (NICODERM CQ - DOSED IN MG/24 HOURS) 14 mg/24hr patch  Place 1 patch (14 mg total) onto the skin daily. 28 patch 0  . pantoprazole (PROTONIX) 40 MG tablet Take 1 tablet (40 mg total) by mouth daily before breakfast. 30 tablet 0  . pentoxifylline (TRENTAL) 400 MG CR tablet Take 1 tablet (400 mg total) by mouth 3 (three) times daily with meals. (Patient taking differently: Take 800 mg by mouth in the morning.)    . pioglitazone (ACTOS) 45 MG tablet Take 45 mg by mouth daily.    . rosuvastatin (CRESTOR) 40 MG tablet Take 40 mg by mouth daily.    . Sodium Phosphates (RA SALINE ENEMA) 19-7 GM/118ML ENEM Place rectally as needed.    . Tiotropium Bromide-Olodaterol (STIOLTO RESPIMAT) 2.5-2.5 MCG/ACT AERS Inhale 2 puffs into the lungs daily. 4 g 0  . verapamil (CALAN-SR) 240 MG CR tablet Take 240 mg by mouth daily.    . vitamin C (ASCORBIC ACID) 500 MG tablet Take 500 mg by mouth daily.    Marland Kitchen zinc sulfate 220 (50 Zn) MG capsule Take 220 mg by mouth daily.     No current facility-administered medications for this visit.    Review of Systems  Constitutional: positive for anorexia and fatigue Eyes: negative Ears, nose, mouth, throat, and face: negative Respiratory: positive for cough, dyspnea on  exertion and pneumonia Cardiovascular: negative Gastrointestinal: negative Genitourinary:negative Integument/breast: negative Hematologic/lymphatic: negative Musculoskeletal:negative Neurological: positive for memory problems Behavioral/Psych: negative Endocrine: negative Allergic/Immunologic: negative   LABORATORY DATA: Lab Results  Component Value Date   WBC 4.2 01/16/2021   HGB 9.0 (L) 01/16/2021   HCT 27.9 (L) 01/16/2021   MCV 86.0 01/16/2021   PLT 226 01/16/2021      Chemistry      Component Value Date/Time   NA 130 (L) 01/16/2021 1517   NA 137 08/20/2015 1232   K 3.6 01/16/2021 1517   K 4.3 08/20/2015 1232   CL 97 (L) 01/16/2021 1517   CO2 24 01/16/2021 1517   CO2 25 08/20/2015 1232   BUN 12 01/16/2021 1517   BUN 10.4 08/20/2015 1232   CREATININE 1.01 (H) 01/16/2021 1517   CREATININE 1.1 08/20/2015 1232      Component Value Date/Time   CALCIUM 8.5 (L) 01/16/2021 1517   CALCIUM 10.0 08/20/2015 1232   ALKPHOS 59 01/16/2021 1517   ALKPHOS 142 08/20/2015 1232   AST 13 (L) 01/16/2021 1517   AST 18 08/20/2015 1232   ALT 11 01/16/2021 1517   ALT 11 08/20/2015 1232   BILITOT 0.4 01/16/2021 1517   BILITOT 0.32 08/20/2015 1232       RADIOGRAPHIC STUDIES: DG Chest 2 View  Result Date: 01/16/2021 CLINICAL DATA:  Chest pain EXAM: CHEST - 2 VIEW COMPARISON:  01/10/2021 radiograph and chest CT, chest x-ray 12/04/2020 FINDINGS: Right middle lobe lung mass redemonstrated, with extension to adjacent pleural surface. Small bilateral pleural effusions. Emphysema. Cardiomegaly with aortic atherosclerosis. No pneumothorax. Calcified granuloma in the right lung base. IMPRESSION: 1. Redemonstrated right middle lobe lung mass 2. Interim small bilateral pleural effusions. 3. Mild cardiomegaly.  Emphysema. Electronically Signed   By: Donavan Foil M.D.   On: 01/16/2021 16:07   DG Chest 2 View  Result Date: 01/10/2021 CLINICAL DATA:  Chest pain, short of breath, right lung mass  EXAM: CHEST - 2 VIEW COMPARISON:  01/02/2021, 01/01/2021 FINDINGS: Frontal and lateral views of the chest demonstrate an unremarkable cardiac silhouette. Stable right middle lobe mass consistent with likely bronchogenic malignancy based on previous CT findings. No new airspace disease, effusion,  or pneumothorax. No acute bony abnormalities. IMPRESSION: 1. Stable right middle lobe mass unchanged since previous CT, most consistent with lung cancer. 2. Otherwise no acute intrathoracic process. Electronically Signed   By: Randa Ngo M.D.   On: 01/10/2021 16:00   Abd 1 View (KUB)  Result Date: 01/10/2021 CLINICAL DATA:  Initial evaluation for acute kidney injury, lung mass. EXAM: ABDOMEN - 1 VIEW COMPARISON:  Prior MRI from 03/17/2020. FINDINGS: Bowel gas pattern within normal limits without obstruction or ileus. No appreciable abnormal bowel wall thickening. No visible free air on this limited supine view the abdomen. No soft tissue mass or abnormal calcification. Cholecystectomy clips overlie the right upper quadrant. Additional surgical clip present at the left hemipelvis. Degenerative spondylosis noted within the lower lumbar spine. No acute osseous finding. Visualized lung bases are grossly clear. IMPRESSION: Nonobstructive bowel gas pattern with no radiographic evidence for acute intra-abdominal pathology. Electronically Signed   By: Jeannine Boga M.D.   On: 01/10/2021 23:19   US RENAL  Result Date: 01/11/2021 CLINICAL DATA:  Acute kidney injury EXAM: RENAL / URINARY TRACT ULTRASOUND COMPLETE COMPARISON:  Ultrasound dated March 12, 2020.  MRI dated February 16, 2020 FINDINGS: Right Kidney: Renal measurements: 10.4.1 x 6.5 cm = volume: 150 mL. There is no hydronephrosis. The echogenicity is increased. There is a 1.5 cm hypoechoic lesion in the upper pole that is favored to represent a cyst. Left Kidney: Renal measurements: 12.1 x 3.7 x 4.3 cm = volume: 101.4 mL. The echogenicity is increased. There is no  hydronephrosis. There is a prominent extrarenal pelvis. Again noted is a cystic appearing lesion involving the interpolar region measuring approximately 1.6 cm Bladder: There is a questionable filling defect within the urinary bladder near the right UVJ. Other: None. IMPRESSION: 1. No hydronephrosis. 2. Echogenic kidneys bilaterally which can be seen in patients with medical renal disease. 3. Questionable filling defect in the urinary bladder at the level of the right UVJ. Outpatient urology follow-up is recommended for this finding. Electronically Signed   By: Constance Holster M.D.   On: 01/11/2021 00:01   NM Myocar Multi W/Spect W/Wall Motion / EF  Result Date: 01/12/2021  There was no ST segment deviation noted during stress.  No T wave inversion was noted during stress.  The study is normal.  This is a low risk study.  The left ventricular ejection fraction is hyperdynamic (>65%).  Nuclear stress EF: 83%.  Normal stress nuclear study with no ischemia or infarction.  Gated ejection fraction 83% with normal wall motion.   NM PET Image Initial (PI) Skull Base To Thigh  Result Date: 01/16/2021 CLINICAL DATA:  Initial treatment strategy for lung mass. EXAM: NUCLEAR MEDICINE PET SKULL BASE TO THIGH TECHNIQUE: 5.8 mCi F-18 FDG was injected intravenously. Full-ring PET imaging was performed from the skull base to thigh after the radiotracer. CT data was obtained and used for attenuation correction and anatomic localization. Fasting blood glucose: 250 mg/dl COMPARISON:  01/02/2021 FINDINGS: Mediastinal blood pool activity: SUV max 2.2 Liver activity: SUV max NA NECK: No hypermetabolic lymph nodes in the neck. Incidental CT findings: none CHEST: Lobulated mass in the RIGHT middle lobe measures 6.5 x 4.1 cm with intense metabolic activity (SUV max equal 12.2). Mass abuts the horizontal fissure and oblique fissure. Hypermetabolic subcarinal lymph node measures 12 mm short axis with SUV max equal 7.1 no  paratracheal adenopathy. No supraclavicular adenopathy. Incidental CT findings: Coronary artery calcification and aortic atherosclerotic calcification. Calcified hilar nodes and calcified  pulmonary granulomas. ABDOMEN/PELVIS: No abnormal hypermetabolic activity within the liver, pancreas, adrenal glands, or spleen. No hypermetabolic lymph nodes in the abdomen or pelvis. Incidental CT findings: Atherosclerotic calcification of the aorta. Postcholecystectomy. Hysterectomy. SKELETON: No focal hypermetabolic activity to suggest skeletal metastasis. Incidental CT findings: none IMPRESSION: 1. Hypermetabolic mass in the RIGHT middle lobe consistent with primary bronchogenic carcinoma. Mass abuts the horizontal fissure and oblique fissure. 2. Mediastinal nodal metastasis to the subcarinal lymph node. 3. FDG PET staging: T3 N2 m0 4. No distant metastatic disease Electronically Signed   By: Suzy Bouchard M.D.   On: 01/16/2021 09:43   DG Chest Port 1 View  Result Date: 01/01/2021 CLINICAL DATA:  Chest pain and shortness of breath. EXAM: PORTABLE CHEST 1 VIEW COMPARISON:  12/25/2020. FINDINGS: Stable parenchymal density and adjacent pleural tenting in the right lower lung zone, shown by previous CT to be highly suspicious for malignancy. The remainder of the lungs are clear. Stable borderline enlarged cardiac silhouette. Stable left breast surgical clips. Diffuse osteopenia. IMPRESSION: 1. Stable previously demonstrated changes in the right lung suspicious for malignancy. 2. No acute abnormality. Electronically Signed   By: Claudie Revering M.D.   On: 01/01/2021 17:01   CT Super D Chest Wo Contrast  Result Date: 01/04/2021 CLINICAL DATA:  Follow-up right lung mass. EXAM: CT CHEST WITHOUT CONTRAST TECHNIQUE: Multidetector CT imaging of the chest was performed using thin slice collimation for electromagnetic bronchoscopy planning purposes, without intravenous contrast. COMPARISON:  12/04/2020 FINDINGS: Cardiovascular: No  acute findings. Aortic and coronary atherosclerotic calcification noted. Mediastinum/Nodes: No pathologically enlarged lymph nodes identified on this unenhanced exam. Mediastinal and hilar lymph node calcifications are seen, consistent with old granulomatous disease. Lungs/Pleura: Persistent spiculated mass is seen in the central right middle lobe, which abuts the minor and major fissures and the right hilum. This measures 5.2 x 3.5 cm on image 88/3, without significant change compared to prior study. No other suspicious pulmonary nodules or masses are identified. No evidence of pleural effusion. Upper Abdomen:  Unremarkable. Musculoskeletal:  No suspicious bone lesions. IMPRESSION: Stable 5.2 cm spiculated mass in central right middle lobe, consistent with primary bronchogenic carcinoma. No evidence of lymphadenopathy or pleural effusion. Aortic Atherosclerosis (ICD10-I70.0). Electronically Signed   By: Marlaine Hind M.D.   On: 01/04/2021 13:03    ASSESSMENT: This is a very pleasant 80 years old white female with highly suspicious stage IIIb (T3, N2, MX) lung cancer pending tissue diagnosis and presented with large right middle lobe lung mass in addition to subcarinal lymphadenopathy.   PLAN: I had a lengthy discussion with the patient and her son today about her current condition and further investigation to confirm her diagnosis. I personally and independently reviewed the scan images and discussed the results with the patient and her son. I recommended for the patient to proceed with bronchoscopy with endobronchial ultrasound and biopsy under the care of Dr. Lamonte Sakai once she recovers from the recent COVID infection and it is safe for her to undergo the procedure. If the final pathology confirmed non-small cell lung cancer, the patient may benefit from a course of concurrent chemoradiation with weekly carboplatin for AUC of 2 and paclitaxel 45 Mg/M2 for 6/7 weeks followed by consolidation immunotherapy if  the patient has no evidence for disease progression. The patient will have a virtual visit with Dr. Tammi Klippel later today for evaluation and recommendation regarding the radiotherapy option of her treatment. She will come back for follow-up visit in around 2 weeks for evaluation and  more detailed discussion of her treatment options based on the final pathology. Also strongly recommend for the patient to continue with the smoking cessation. The patient was advised to call immediately if she has any concerning symptoms in the interval.  The patient voices understanding of current disease status and treatment options and is in agreement with the current care plan.  All questions were answered. The patient knows to call the clinic with any problems, questions or concerns. We can certainly see the patient much sooner if necessary.  Thank you so much for allowing me to participate in the care of [name]. I will continue to follow up the patient with you and assist in her care.    Disclaimer: This note was dictated with voice recognition software. Similar sounding words can inadvertently be transcribed and may not be corrected upon review.   I provided 65 minutes of non face-to-face telephone visit time during this encounter, and > 50% was spent counseling as documented under my assessment & plan.  Eilleen Kempf, MD 01/29/2021 1:57 PM  Disclaimer: This note was dictated with voice recognition software. Similar sounding words can inadvertently be transcribed and may not be corrected upon review.

## 2021-01-30 LAB — CBC AND DIFFERENTIAL
HCT: 23 — AB (ref 36–46)
Hemoglobin: 7.6 — AB (ref 12.0–16.0)
Platelets: 703 — AB (ref 150–399)
WBC: 8.3

## 2021-01-30 LAB — CBC: RBC: 2.8 — AB (ref 3.87–5.11)

## 2021-02-02 DIAGNOSIS — C3491 Malignant neoplasm of unspecified part of right bronchus or lung: Secondary | ICD-10-CM | POA: Insufficient documentation

## 2021-02-02 NOTE — Telephone Encounter (Signed)
Dr. Lamonte Sakai please advise on new dates/ time frame.

## 2021-02-02 NOTE — Telephone Encounter (Signed)
OK to try to schedule this for afternoon 5/25 or 5/26 in [facility]. If we do this she needs to stop her pletal now. You'll have to send word to her SNF to make this happen  Alternate date would be 02/16/21 in the am in [facility]. She would need to stop her Pletal 5 days prior.

## 2021-02-02 NOTE — Telephone Encounter (Signed)
We will need new dates to schedule with Dr Lamonte Sakai

## 2021-02-02 NOTE — Progress Notes (Addendum)
Radiation Oncology         [phone] ________________________________ Multidisciplinary Thoracic Oncology Clinic [facility]) Initial Outpatient Consultation - Conducted via telephone due to current COVID-19 concerns for limiting patient exposure  Name: Dawn [name] MRN: [id number]  Date of Service: 01/29/2021 DOB: [birth year]  WE:RXVQ, Dwyane Luo, MD  Valeta Harms Octavio Graves, DO   REFERRING PHYSICIAN: Garner Nash, DO  DIAGNOSIS: 80 year old female with newly diagnosed, putative, T3 N2 M0 primary bronchogenic carcinoma of the right lung, tissue confirmation pending.    ICD-10-CM   1. Primary lung cancer, right (HCC)  C34.91     HISTORY OF PRESENT ILLNESS: Dawn [name] is a 80 y.o. female seen at the request of Dr. Lamonte Sakai.  She has a history of COPD and continues to smoke cigarettes.  She also has a prior history of breast cancer in the left breast which has been in the remission and significant dementia.  Her son, Dawn [name] provides the majority of the history.  She initially presented to the [facility] ED on 12/04/2020 with complaints of increased shortness of breath, generalized weakness and forgetfulness which had been progressive over the prior month.  A CT chest was performed at that time and revealed a 6 x 3.3 cm right middle lobe lung mass extending from the right hilum to the lateral pleural surface, concerning for malignancy.  She was discharged home with plans for outpatient bronchoscopy for biopsy on 12/08/2020 but this was delayed due to the need for cardiac clearance.  She presented back to the emergency department on 12/11/2020 with increased shortness of breath related to COPD exacerbation and again discharged on 12/14/2020.  Unfortunately, she has had multiple emergency department visits since that time, related to her difficulty with breathing.  She had an MRI brain on 01/06/2021 which was negative for metastatic disease.  At the time of preoperative testing on 01/22/2021, she tested  positive for COVID and the procedure had to be canceled.  She did have a PET scan on 01/15/2021 confirming hypermetabolism in the right middle lobe mass abutting the horizontal and oblique fissures, consistent with primary bronchogenic carcinoma, as well as mediastinal nodal metastasis in a subcarinal lymph node (cT3N2M0) without evidence of distant metastatic disease.  The patient was referred today for presentation in the multidisciplinary thoracic oncology conference. Radiology studies and pathology slides were presented there for review and discussion of treatment options and consensus recommendation was to proceed with bronchoscopy for tissue confirmation to further guide appropriate treatment recommendations.  We have discussed this with the patient and her son, Dawn [name], today.  PREVIOUS RADIATION THERAPY: Yes overlapping with potential right lung radiotherapy Dates:   11/18/15- 12/16/15 Site/dose:   Left breast treated to 40.5 Gy in 15 fractions, Left breast boost treated to 10 Gy in 5 fractions Beams/energy:   Left breast: 3D-Conformal/ 6X , Left breast boost: Electron Monte Carlo/ 15 MeV   PAST MEDICAL HISTORY:  Past Medical History:  Diagnosis Date   Anemia    Anxiety    Arthritis    Breast cancer of upper-outer quadrant of left female breast ([place]) 08/18/2015   Broken wrist    when younger x2   COPD (chronic obstructive pulmonary disease) ([place])    Hospitalization March 2011   Depression    Diabetes mellitus without complication ([place])    type 2   Dyspnea    Full dentures    H/O vaginal delivery 1962   x1    High cholesterol    History  of home oxygen therapy    Hypertension    Memory changes    sharp decline over the last 3 months, per son as of 01/22/21,    Pneumonia    Radiation    11/18/15- 12/16/15 to her Left Breast      PAST SURGICAL HISTORY: Past Surgical History:  Procedure Laterality Date   ABDOMINAL HYSTERECTOMY     APPENDECTOMY     CHOLECYSTECTOMY     EYE SURGERY      cataracts   RADIOACTIVE SEED GUIDED PARTIAL MASTECTOMY WITH AXILLARY SENTINEL LYMPH NODE BIOPSY Left 09/17/2015   Procedure: RADIOACTIVE SEED LOCALIZATION LEFT BREAST LUMPECTOMY AND LEFT AXILLARY SENTINEL LYMPH NODE BIOPSY;  Surgeon: Excell Seltzer, MD;  Location: [facility] OR;  Service: General;  Laterality: Left;   TONSILLECTOMY      FAMILY HISTORY:  Family History  Problem Relation Age of Onset   Colon cancer Sister    Lung cancer Sister     SOCIAL HISTORY:  Social History   Socioeconomic History   Marital status: Legally Separated    Spouse name: Not on file   Number of children: Not on file   Years of education: Not on file   Highest education level: Not on file  Occupational History   Not on file  Tobacco Use   Smoking status: Current Every Day Smoker    Packs/day: 1.00    Years: 70.00    Pack years: 70.00    Types: Cigarettes   Smokeless tobacco: Never Used   Tobacco comment: has not smoked since been at [facility] as of 01/22/21  Vaping Use   Vaping Use: Never used  Substance and Sexual Activity   Alcohol use: Yes    Comment: rare   Drug use: No   Sexual activity: Not on file    Comment: Hysterectomy  Other Topics Concern   Not on file  Social History Narrative   Not on file   Social Determinants of Health   Financial Resource Strain: Not on file  Food Insecurity: Not on file  Transportation Needs: Not on file  Physical Activity: Not on file  Stress: Not on file  Social Connections: Not on file  Intimate Partner Violence: Not on file    ALLERGIES: Ferumoxytol, Pneumococcal vaccines, and Repaglinide  MEDICATIONS:  Current Outpatient Medications  Medication Sig Dispense Refill   albuterol (VENTOLIN HFA) 108 (90 Base) MCG/ACT inhaler Inhale 2 puffs into the lungs every 2 (two) hours as needed for wheezing or shortness of breath. Please instruct in usage (Patient taking differently: Inhale 2 puffs into the lungs every 2 (two) hours as needed for wheezing or  shortness of breath.) 1 each 2   anastrozole (ARIMIDEX) 1 MG tablet Take 1 mg by mouth daily.     aspirin EC 81 MG tablet Take 81 mg by mouth daily.     bisacodyl (DULCOLAX) 10 MG suppository Place 10 mg rectally as needed for moderate constipation.     dexamethasone (DECADRON) 4 MG tablet Take 4 mg by mouth 2 (two) times daily with a meal.     guaiFENesin (ROBITUSSIN) 100 MG/5ML SOLN Take 5 mLs by mouth every 6 (six) hours as needed for cough or to loosen phlegm.     Magnesium Hydroxide (MILK OF MAGNESIA PO) Take 30 mLs by mouth as needed.     metFORMIN (GLUCOPHAGE-XR) 500 MG 24 hr tablet Take 1 tablet (500 mg total) by mouth 2 (two) times daily. (Patient taking differently: Take 100 mg by mouth  daily with breakfast.) 60 tablet 2   nicotine (NICODERM CQ - DOSED IN MG/24 HOURS) 14 mg/24hr patch Place 1 patch (14 mg total) onto the skin daily. 28 patch 0   pantoprazole (PROTONIX) 40 MG tablet Take 1 tablet (40 mg total) by mouth daily before breakfast. 30 tablet 0   pentoxifylline (TRENTAL) 400 MG CR tablet Take 1 tablet (400 mg total) by mouth 3 (three) times daily with meals. (Patient taking differently: Take 800 mg by mouth in the morning.)     pioglitazone (ACTOS) 45 MG tablet Take 45 mg by mouth daily.     rosuvastatin (CRESTOR) 40 MG tablet Take 40 mg by mouth daily.     Sodium Phosphates (RA SALINE ENEMA) 19-7 GM/118ML ENEM Place rectally as needed.     Tiotropium Bromide-Olodaterol (STIOLTO RESPIMAT) 2.5-2.5 MCG/ACT AERS Inhale 2 puffs into the lungs daily. 4 g 0   verapamil (CALAN-SR) 240 MG CR tablet Take 240 mg by mouth daily.     vitamin C (ASCORBIC ACID) 500 MG tablet Take 500 mg by mouth daily.     zinc sulfate 220 (50 Zn) MG capsule Take 220 mg by mouth daily.     No current facility-administered medications for this encounter.    REVIEW OF SYSTEMS:  On review of systems, the patient's son, Dawn [name] reports that she is doing well overall. She she has not currently been complaining any  chest pain, shortness of breath, cough, fevers, chills, night sweats, or unintended weight changes.  She has not been complaining of any bowel or bladder disturbances, and denies abdominal pain, nausea or vomiting.  She denies any new musculoskeletal or joint aches or pains. A complete review of systems is obtained and is otherwise negative.  PHYSICAL EXAM:  Wt Readings from Last 3 Encounters:  01/22/21 118 lb 3.2 oz (53.6 kg)  01/21/21 121 lb (54.9 kg)  01/16/21 115 lb (52.2 kg)   Temp Readings from Last 3 Encounters:  01/22/21 97.7 F (36.5 C)  01/22/21 (!) 97.5 F (36.4 C)  01/16/21 98.1 F (36.7 C) (Oral)   BP Readings from Last 3 Encounters:  01/22/21 106/69  01/22/21 (!) 120/99  01/16/21 (!) 122/52   Pulse Readings from Last 3 Encounters:  01/22/21 69  01/22/21 71  01/16/21 (!) 59    /10  Unable to assess due to telephone consult visit format.  KPS = 50  100 - Normal; no complaints; no evidence of disease. 90   - Able to carry on normal activity; minor signs or symptoms of disease. 80   - Normal activity with effort; some signs or symptoms of disease. 57   - Cares for self; unable to carry on normal activity or to do active work. 60   - Requires occasional assistance, but is able to care for most of his personal needs. 50   - Requires considerable assistance and frequent medical care. 57   - Disabled; requires special care and assistance. 45   - Severely disabled; hospital admission is indicated although death not imminent. 107   - Very sick; hospital admission necessary; active supportive treatment necessary. 10   - Moribund; fatal processes progressing rapidly. 0     - Dead  Karnofsky DA, Abelmann Coolidge, Craver LS and Burchenal [facility] [phone]) The use of the nitrogen mustards in the palliative treatment of carcinoma: with particular reference to bronchogenic carcinoma Cancer 1 634-56  LABORATORY DATA:  Lab Results  Component Value Date   WBC 4.2 01/16/2021  HGB 9.0  (L) 01/16/2021   HCT 27.9 (L) 01/16/2021   MCV 86.0 01/16/2021   PLT 226 01/16/2021   Lab Results  Component Value Date   NA 130 (L) 01/16/2021   K 3.6 01/16/2021   CL 97 (L) 01/16/2021   CO2 24 01/16/2021   Lab Results  Component Value Date   ALT 11 01/16/2021   AST 13 (L) 01/16/2021   ALKPHOS 59 01/16/2021   BILITOT 0.4 01/16/2021     RADIOGRAPHY: DG Chest 2 View  Result Date: 01/16/2021 CLINICAL DATA:  Chest pain EXAM: CHEST - 2 VIEW COMPARISON:  01/10/2021 radiograph and chest CT, chest x-ray 12/04/2020 FINDINGS: Right middle lobe lung mass redemonstrated, with extension to adjacent pleural surface. Small bilateral pleural effusions. Emphysema. Cardiomegaly with aortic atherosclerosis. No pneumothorax. Calcified granuloma in the right lung base. IMPRESSION: 1. Redemonstrated right middle lobe lung mass 2. Interim small bilateral pleural effusions. 3. Mild cardiomegaly.  Emphysema. Electronically Signed   By: Donavan Foil M.D.   On: 01/16/2021 16:07   DG Chest 2 View  Result Date: 01/10/2021 CLINICAL DATA:  Chest pain, short of breath, right lung mass EXAM: CHEST - 2 VIEW COMPARISON:  01/02/2021, 01/01/2021 FINDINGS: Frontal and lateral views of the chest demonstrate an unremarkable cardiac silhouette. Stable right middle lobe mass consistent with likely bronchogenic malignancy based on previous CT findings. No new airspace disease, effusion, or pneumothorax. No acute bony abnormalities. IMPRESSION: 1. Stable right middle lobe mass unchanged since previous CT, most consistent with lung cancer. 2. Otherwise no acute intrathoracic process. Electronically Signed   By: Randa Ngo M.D.   On: 01/10/2021 16:00   Abd 1 View (KUB)  Result Date: 01/10/2021 CLINICAL DATA:  Initial evaluation for acute kidney injury, lung mass. EXAM: ABDOMEN - 1 VIEW COMPARISON:  Prior MRI from 03/17/2020. FINDINGS: Bowel gas pattern within normal limits without obstruction or ileus. No appreciable abnormal  bowel wall thickening. No visible free air on this limited supine view the abdomen. No soft tissue mass or abnormal calcification. Cholecystectomy clips overlie the right upper quadrant. Additional surgical clip present at the left hemipelvis. Degenerative spondylosis noted within the lower lumbar spine. No acute osseous finding. Visualized lung bases are grossly clear. IMPRESSION: Nonobstructive bowel gas pattern with no radiographic evidence for acute intra-abdominal pathology. Electronically Signed   By: Jeannine Boga M.D.   On: 01/10/2021 23:19   US RENAL  Result Date: 01/11/2021 CLINICAL DATA:  Acute kidney injury EXAM: RENAL / URINARY TRACT ULTRASOUND COMPLETE COMPARISON:  Ultrasound dated March 12, 2020.  MRI dated February 16, 2020 FINDINGS: Right Kidney: Renal measurements: 10.4.1 x 6.5 cm = volume: 150 mL. There is no hydronephrosis. The echogenicity is increased. There is a 1.5 cm hypoechoic lesion in the upper pole that is favored to represent a cyst. Left Kidney: Renal measurements: 12.1 x 3.7 x 4.3 cm = volume: 101.4 mL. The echogenicity is increased. There is no hydronephrosis. There is a prominent extrarenal pelvis. Again noted is a cystic appearing lesion involving the interpolar region measuring approximately 1.6 cm Bladder: There is a questionable filling defect within the urinary bladder near the right UVJ. Other: None. IMPRESSION: 1. No hydronephrosis. 2. Echogenic kidneys bilaterally which can be seen in patients with medical renal disease. 3. Questionable filling defect in the urinary bladder at the level of the right UVJ. Outpatient urology follow-up is recommended for this finding. Electronically Signed   By: Constance Holster M.D.   On: 01/11/2021 00:01  NM Myocar Multi W/Spect W/Wall Motion / EF  Result Date: 01/12/2021  There was no ST segment deviation noted during stress.  No T wave inversion was noted during stress.  The study is normal.  This is a low risk study.  The  left ventricular ejection fraction is hyperdynamic (>65%).  Nuclear stress EF: 83%.  Normal stress nuclear study with no ischemia or infarction.  Gated ejection fraction 83% with normal wall motion.   NM PET Image Initial (PI) Skull Base To Thigh  Result Date: 01/16/2021 CLINICAL DATA:  Initial treatment strategy for lung mass. EXAM: NUCLEAR MEDICINE PET SKULL BASE TO THIGH TECHNIQUE: 5.8 mCi F-18 FDG was injected intravenously. Full-ring PET imaging was performed from the skull base to thigh after the radiotracer. CT data was obtained and used for attenuation correction and anatomic localization. Fasting blood glucose: 250 mg/dl COMPARISON:  01/02/2021 FINDINGS: Mediastinal blood pool activity: SUV max 2.2 Liver activity: SUV max NA NECK: No hypermetabolic lymph nodes in the neck. Incidental CT findings: none CHEST: Lobulated mass in the RIGHT middle lobe measures 6.5 x 4.1 cm with intense metabolic activity (SUV max equal 12.2). Mass abuts the horizontal fissure and oblique fissure. Hypermetabolic subcarinal lymph node measures 12 mm short axis with SUV max equal 7.1 no paratracheal adenopathy. No supraclavicular adenopathy. Incidental CT findings: Coronary artery calcification and aortic atherosclerotic calcification. Calcified hilar nodes and calcified pulmonary granulomas. ABDOMEN/PELVIS: No abnormal hypermetabolic activity within the liver, pancreas, adrenal glands, or spleen. No hypermetabolic lymph nodes in the abdomen or pelvis. Incidental CT findings: Atherosclerotic calcification of the aorta. Postcholecystectomy. Hysterectomy. SKELETON: No focal hypermetabolic activity to suggest skeletal metastasis. Incidental CT findings: none IMPRESSION: 1. Hypermetabolic mass in the RIGHT middle lobe consistent with primary bronchogenic carcinoma. Mass abuts the horizontal fissure and oblique fissure. 2. Mediastinal nodal metastasis to the subcarinal lymph node. 3. FDG PET staging: T3 N2 m0 4. No distant  metastatic disease Electronically Signed   By: Suzy Bouchard M.D.   On: 01/16/2021 09:43      IMPRESSION/PLAN:  This visit was conducted via telephone to spare the patient unnecessary potential exposure in the healthcare setting during the current COVID-19 pandemic.  53. 80 y.o. female with newly diagnosed, putative, T3 N2 M0 primary bronchogenic carcinoma of the right lung, tissue confirmation pending. Today, we talked to the patient and her son, Dawn [name], about the findings and workup thus far. We discussed the natural history of primary bronchogenic carcinoma and general treatment, highlighting the role of radiotherapy in the management.  We discussed the consensus recommendation from multidisciplinary conference regarding proceeding with bronchoscopy for tissue confirmation to better guide our treatment recommendations.  They were informed that pending those results and the patient's recovery of functional performance status, her options for treatment would be either a 2-week course of palliative radiotherapy to improve associated symptoms versus a more definitive treatment approach with a 6-1/2-week course of concurrent chemoradiotherapy.  We discussed the available radiation techniques, and focused on the details of logistics and delivery. We reviewed the anticipated acute and late sequelae associated with radiation in this setting. The patient and her son, Dawn [name], were encouraged to ask questions that were answered to their stated satisfaction.  At the conclusion of our conversation, they are in agreement to proceed with bronchoscopy for tissue confirmation.  She will meet back with Dr. Lamonte Sakai in the near future to schedule this procedure and once we have those results, we will plan to reconnect with her and her son, Dawn [name],  to further discuss treatment recommendations at that time.  We enjoyed meeting with him today and they know that they are welcome to call at anytime in the interim with any questions or  concerns related to radiation.  Given current concerns for patient exposure during the COVID-19 pandemic, this encounter was conducted via telephone. The patient was notified in advance and was offered a [place] meeting to allow for face to face communication but unfortunately reported that she did not have the appropriate resources/technology to support such a visit and instead preferred to proceed with telephone consult. The patient has given verbal consent for this type of encounter. The time spent during this encounter was 75 minutes. The attendants for this meeting include Tyler Pita MD, Ashlyn Bruning PA-C, patient, Janey Petron.Aarons and her son, Dawn [name]. During the encounter, Tyler Pita MD, and Andi Devon, were located at [facility] Radiation Oncology Department.  Patient,  Dawn [name] and her son, Dawn [name], were located at [facility] and [place].   Nicholos Johns, PA-C    Tyler Pita, MD  [facility] Direct Dial: [phone]  Fax: [phone] [email or web address]  Skype  [email or web address]

## 2021-02-03 ENCOUNTER — Encounter: Payer: Self-pay | Admitting: Emergency Medicine

## 2021-02-03 ENCOUNTER — Encounter: Payer: Self-pay | Admitting: *Deleted

## 2021-02-03 NOTE — Telephone Encounter (Signed)
Son is aware of appt I have also faxed a note to stop meds the only day they could do Dr Lamonte Sakai was 6/6

## 2021-02-06 ENCOUNTER — Encounter: Payer: Self-pay | Admitting: Adult Health

## 2021-02-06 ENCOUNTER — Non-Acute Institutional Stay (SKILLED_NURSING_FACILITY): Payer: Medicare Other | Admitting: Adult Health

## 2021-02-06 DIAGNOSIS — R918 Other nonspecific abnormal finding of lung field: Secondary | ICD-10-CM

## 2021-02-06 DIAGNOSIS — Z720 Tobacco use: Secondary | ICD-10-CM

## 2021-02-06 DIAGNOSIS — U071 COVID-19: Secondary | ICD-10-CM

## 2021-02-06 DIAGNOSIS — J449 Chronic obstructive pulmonary disease, unspecified: Secondary | ICD-10-CM

## 2021-02-06 DIAGNOSIS — M6281 Muscle weakness (generalized): Secondary | ICD-10-CM | POA: Diagnosis not present

## 2021-02-06 DIAGNOSIS — C50412 Malignant neoplasm of upper-outer quadrant of left female breast: Secondary | ICD-10-CM

## 2021-02-06 DIAGNOSIS — D381 Neoplasm of uncertain behavior of trachea, bronchus and lung: Secondary | ICD-10-CM | POA: Diagnosis not present

## 2021-02-06 DIAGNOSIS — Z170 Estrogen receptor positive status [ER+]: Secondary | ICD-10-CM | POA: Diagnosis not present

## 2021-02-06 DIAGNOSIS — E1169 Type 2 diabetes mellitus with other specified complication: Secondary | ICD-10-CM

## 2021-02-06 NOTE — Progress Notes (Signed)
Location:  [facility] Room Number: 124-A Place of Service:  SNF (31) Provider:  Durenda Age, DNP, FNP-BC  Patient Care Team: Lawerance Cruel, MD as PCP - General (Family Medicine) Berniece Salines, DO as PCP - Cardiology (Cardiology) Excell Seltzer, MD (Inactive) as Consulting Physician (General Surgery) Nicholas Lose, MD as Consulting Physician (Hematology and Oncology) Arloa Koh, MD (Inactive) as Consulting Physician (Radiation Oncology) Sylvan Cheese, NP as Nurse Practitioner (Hematology and Oncology) Eppie Gibson, MD as Attending Physician (Radiation Oncology)  Extended Emergency Contact Information Primary Emergency Contact: Scarborough,Don          [place] [address] [address] [place] Phone: [phone] Mobile Phone: [phone] Relation: Son  Code Status:  Full Code  Goals of care: Advanced Directive information Advanced Directives 02/06/2021  Does Patient Have a Medical Advance Directive? No  Type of Advance Directive -  Does patient want to make changes to medical advance directive? -  Copy of [place] in Chart? -  Would patient like information on creating a medical advance directive? No - Patient declined     Chief Complaint  Patient presents with  . Discharge Note    Discharge from SNF    HPI:  Pt is a 80 y.o. female who is for discharge home on 02/06/21 with Home health PT, OT and Aide.  She was admitted to [facility] on 01/13/21 post hospital admission 01/10/21 to 01/13/21.  She has a PMH of longstanding tobacco abuse, 2 packs/day, COPD, type 2 diabetes mellitus, hypertension, dyslipidemia, history of breast cancer with known right hilar lung mass concerning for bronchogenic carcinoma --pending cardiology evaluation for bronchoscopy and biopsy.  She presented to the ER on 01/10/2021 with generalized weakness, ongoing intermittent chest pressure with exertion  and overall failure to thrive.  Work-up in the ED showed creatinine of 1.5, which is higher than baseline, less than 1, high-sensitivity troponin was in the high normal range, chest x-ray noted with right middle lobe mass.  Chest pain was substernal and worse with exertion.  EKG without acute findings, echocardiogram in March was unremarkable with preserved LVEF and wall motion.  Cardiology was consulted and underwent Lexiscan Myoview which was low risk, negative for ischemia or infarction.  No further cardiac work-up recommended. Quinalapril was discontinued due to elevated creatinine of 1.5.  At [facility], she tested positive for COVID-19 rapid test on 01/22/2021, which was the day she was supposed to have bronchoscopy. She was treated with Decadron, zinc sulfate, vitamin C, guaifenesin and doxycycline x10 days.  Patient was admitted to this facility for short-term rehabilitation after the patient's recent hospitalization.  Patient has completed SNF rehabilitation and therapy has cleared the patient for discharge.   Past Medical History:  Diagnosis Date  . Anemia   . Anxiety   . Arthritis   . Breast cancer of upper-outer quadrant of left female breast ([place]) 08/18/2015  . Broken wrist    when younger x2  . COPD (chronic obstructive pulmonary disease) [facility])    Hospitalization March 2011  . Depression   . Diabetes mellitus without complication ([place])    type 2  . Dyspnea   . Full dentures   . H/O vaginal delivery 1962   x1   . High cholesterol   . History of home oxygen therapy   . Hypertension   . Memory changes    sharp decline over the last 3 months, per son as of 01/22/21,   . Pneumonia   .  Radiation    11/18/15- 12/16/15 to her Left Breast   Past Surgical History:  Procedure Laterality Date  . ABDOMINAL HYSTERECTOMY    . APPENDECTOMY    . CHOLECYSTECTOMY    . EYE SURGERY     cataracts  . RADIOACTIVE SEED GUIDED PARTIAL MASTECTOMY WITH AXILLARY SENTINEL LYMPH NODE BIOPSY Left  09/17/2015   Procedure: RADIOACTIVE SEED LOCALIZATION LEFT BREAST LUMPECTOMY AND LEFT AXILLARY SENTINEL LYMPH NODE BIOPSY;  Surgeon: Excell Seltzer, MD;  Location: [facility];  Service: General;  Laterality: Left;  . TONSILLECTOMY      Allergies  Allergen Reactions  . Ferumoxytol Shortness Of Breath, Itching and Other (See Comments)    Pt reported lightheadedness, a flushed feeling, SOB, and itching  . Pneumococcal Vaccines Hives, Swelling and Other (See Comments)    Arm became swollen  . Repaglinide Other (See Comments)    Dropped BGL too low    Outpatient Encounter Medications as of 02/06/2021  Medication Sig  . albuterol (VENTOLIN HFA) 108 (90 Base) MCG/ACT inhaler Inhale 2 puffs into the lungs every 2 (two) hours as needed for wheezing or shortness of breath. Please instruct in usage  . anastrozole (ARIMIDEX) 1 MG tablet Take 1 mg by mouth daily.  Marland Kitchen aspirin EC 81 MG tablet Take 81 mg by mouth daily.  . bisacodyl (DULCOLAX) 10 MG suppository Place 10 mg rectally as needed for moderate constipation.  . famotidine (PEPCID) 20 MG tablet Take 20 mg by mouth 2 (two) times daily.  . ferrous sulfate 325 (65 FE) MG tablet Take 325 mg by mouth 2 (two) times daily.  . Magnesium Hydroxide (MILK OF MAGNESIA PO) Take 30 mLs by mouth as needed.  . metFORMIN (GLUCOPHAGE) 500 MG tablet Take 500 mg by mouth daily.  . nicotine (NICODERM CQ - DOSED IN MG/24 HOURS) 14 mg/24hr patch Place 1 patch (14 mg total) onto the skin daily.  . nitroGLYCERIN (NITROSTAT) 0.4 MG SL tablet Place 0.4 mg under the tongue every 15 (fifteen) minutes as needed for chest pain.  . pantoprazole (PROTONIX) 40 MG tablet Take 1 tablet (40 mg total) by mouth daily before breakfast.  . pentoxifylline (TRENTAL) 400 MG CR tablet Take 1 tablet (400 mg total) by mouth 3 (three) times daily with meals.  . pioglitazone (ACTOS) 45 MG tablet Take 45 mg by mouth daily.  . rosuvastatin (CRESTOR) 40 MG tablet Take 40 mg by mouth daily.  .  sennosides-docusate sodium (SENOKOT-S) 8.6-50 MG tablet Take 2 tablets by mouth in the morning and at bedtime.  . Sodium Phosphates (RA SALINE ENEMA) 19-7 GM/118ML ENEM Place rectally as needed.  . Tiotropium Bromide-Olodaterol (STIOLTO RESPIMAT) 2.5-2.5 MCG/ACT AERS Inhale 2 puffs into the lungs daily.  . verapamil (CALAN-SR) 240 MG CR tablet Take 240 mg by mouth daily.  . [DISCONTINUED] dexamethasone (DECADRON) 4 MG tablet Take 4 mg by mouth 2 (two) times daily with a meal.  . [DISCONTINUED] guaiFENesin (ROBITUSSIN) 100 MG/5ML SOLN Take 5 mLs by mouth every 6 (six) hours as needed for cough or to loosen phlegm.  . [DISCONTINUED] metFORMIN (GLUCOPHAGE-XR) 500 MG 24 hr tablet Take 1 tablet (500 mg total) by mouth 2 (two) times daily. (Patient taking differently: Take 100 mg by mouth daily with breakfast.)  . [DISCONTINUED] vitamin C (ASCORBIC ACID) 500 MG tablet Take 500 mg by mouth daily.  . [DISCONTINUED] zinc sulfate 220 (50 Zn) MG capsule Take 220 mg by mouth daily.   No facility-administered encounter medications on file as of 02/06/2021.  Review of Systems  GENERAL: No fever or chills  MOUTH and THROAT: Denies oral discomfort, gingival pain or bleeding RESPIRATORY: no cough, wheezing, hemoptysis CARDIAC: No chest pain, edema or palpitations GI: No abdominal pain, diarrhea, constipation, heart burn, nausea or vomiting GU: Denies dysuria, frequency, hematuria, incontinence, or discharge NEUROLOGICAL: Denies dizziness, syncope, numbness, or headache PSYCHIATRIC: Denies feelings of depression or anxiety. No report of hallucinations, insomnia, paranoia, or agitation   Immunization History  Administered Date(s) Administered  . Influenza Split 06/27/2009  . Influenza, High Dose Seasonal PF 07/19/2018, 06/05/2019, 06/06/2020  . Influenza,inj,Quad PF,6+ Mos 06/06/2017  . Influenza,inj,quad, With Preservative 06/20/2014, 06/04/2016  . Influenza-Unspecified 07/10/2012, 05/24/2013,  05/07/2015  . PFIZER(Purple Top)SARS-COV-2 Vaccination 03/15/2020, 04/05/2020   Pertinent  Health Maintenance Due  Topic Date Due  . FOOT EXAM  Never done  . OPHTHALMOLOGY EXAM  Never done  . URINE MICROALBUMIN  Never done  . PNA vac Low Risk Adult (1 of 2 - PCV13) Never done  . INFLUENZA VACCINE  04/13/2021  . HEMOGLOBIN A1C  06/07/2021  . DEXA SCAN  Completed   Fall Risk  01/16/2016 10/15/2015  Falls in the past year? Yes No  Comment she fell two weeks ago and doesn't remember how it happened -  Number falls in past yr: 1 -  Injury with Fall? Yes -  Comment she has some bruises to her arms -  Risk Factor Category  High Fall Risk -  Risk for fall due to : History of fall(s) -  Follow up Falls evaluation completed -     Vitals:   02/06/21 1116  BP: (!) 98/54  Pulse: 91  Resp: 18  Temp: (!) 97 F (36.1 C)  Weight: 111 lb 9.6 oz (50.6 kg)  Height: 5\' 4"  (1.626 m)   Body mass index is 19.16 kg/m.  Physical Exam  GENERAL APPEARANCE:  In no acute distress.  SKIN:  Skin is warm and dry.  MOUTH and THROAT: Lips are without lesions. Oral mucosa is moist and without lesions.  RESPIRATORY: Breathing is even & unlabored, BS CTAB CARDIAC: RRR, no murmur,no extra heart sounds, no edema GI: Abdomen soft, normal BS, no masses, no tenderness NEUROLOGICAL: There is no tremor. Speech is clear. Alert and oriented X 3. PSYCHIATRIC:  Affect and behavior are appropriate  Labs reviewed: Recent Labs    12/11/20 2145 12/12/20 0454 01/11/21 0305 01/12/21 0626 01/16/21 1517  NA  --    < > 136 132* 130*  K  --    < > 3.5 3.7 3.6  CL  --    < > 98 98 97*  CO2  --    < > 30 25 24   GLUCOSE  --    < > 126* 203* 258*  BUN  --    < > 13 11 12   CREATININE  --    < > 1.45* 1.55* 1.01*  CALCIUM  --    < > 8.7* 8.6* 8.5*  MG 1.5*  --  1.5*  --   --    < > = values in this interval not displayed.   Recent Labs    01/10/21 1551 01/11/21 0305 01/16/21 1517  AST 12* 12* 13*  ALT 9 9 11    ALKPHOS 81 64 59  BILITOT 0.5 0.4 0.4  PROT 5.8* 4.9* 4.8*  ALBUMIN 2.7* 2.3* 2.2*   Recent Labs    01/01/21 1633 01/10/21 1551 01/11/21 0305 01/16/21 1517 01/16/21 2032 01/30/21 0000  WBC 11.8*  8.9 7.8 4.2  --  8.3  NEUTROABS 11.1*  --   --  2.8  --   --   HGB 13.2 12.5 11.4* 9.1* 9.0* 7.6*  HCT 40.7 39.0 35.0* 28.8* 27.9* 23*  MCV 82.6 83.0 82.0 86.0  --   --   PLT 453* 326 273 226  --  703*   Lab Results  Component Value Date   TSH  11/25/2009    0.659 (NOTE)  Please note change in reference ranges for ages 51W to 66Y.Test methodology is 3rd generation TSH   Lab Results  Component Value Date   HGBA1C 6.5 (H) 12/05/2020   Lab Results  Component Value Date   CHOL 93 01/12/2021   HDL 35 (L) 01/12/2021   LDLCALC 37 01/12/2021   TRIG 107 01/12/2021   CHOLHDL 2.7 01/12/2021    Significant Diagnostic Results in last 30 days:  DG Chest 2 View  Result Date: 01/16/2021 CLINICAL DATA:  Chest pain EXAM: CHEST - 2 VIEW COMPARISON:  01/10/2021 radiograph and chest CT, chest x-ray 12/04/2020 FINDINGS: Right middle lobe lung mass redemonstrated, with extension to adjacent pleural surface. Small bilateral pleural effusions. Emphysema. Cardiomegaly with aortic atherosclerosis. No pneumothorax. Calcified granuloma in the right lung base. IMPRESSION: 1. Redemonstrated right middle lobe lung mass 2. Interim small bilateral pleural effusions. 3. Mild cardiomegaly.  Emphysema. Electronically Signed   By: Donavan Foil M.D.   On: 01/16/2021 16:07   DG Chest 2 View  Result Date: 01/10/2021 CLINICAL DATA:  Chest pain, short of breath, right lung mass EXAM: CHEST - 2 VIEW COMPARISON:  01/02/2021, 01/01/2021 FINDINGS: Frontal and lateral views of the chest demonstrate an unremarkable cardiac silhouette. Stable right middle lobe mass consistent with likely bronchogenic malignancy based on previous CT findings. No new airspace disease, effusion, or pneumothorax. No acute bony abnormalities.  IMPRESSION: 1. Stable right middle lobe mass unchanged since previous CT, most consistent with lung cancer. 2. Otherwise no acute intrathoracic process. Electronically Signed   By: Randa Ngo M.D.   On: 01/10/2021 16:00   Abd 1 View (KUB)  Result Date: 01/10/2021 CLINICAL DATA:  Initial evaluation for acute kidney injury, lung mass. EXAM: ABDOMEN - 1 VIEW COMPARISON:  Prior MRI from 03/17/2020. FINDINGS: Bowel gas pattern within normal limits without obstruction or ileus. No appreciable abnormal bowel wall thickening. No visible free air on this limited supine view the abdomen. No soft tissue mass or abnormal calcification. Cholecystectomy clips overlie the right upper quadrant. Additional surgical clip present at the left hemipelvis. Degenerative spondylosis noted within the lower lumbar spine. No acute osseous finding. Visualized lung bases are grossly clear. IMPRESSION: Nonobstructive bowel gas pattern with no radiographic evidence for acute intra-abdominal pathology. Electronically Signed   By: Jeannine Boga M.D.   On: 01/10/2021 23:19   US RENAL  Result Date: 01/11/2021 CLINICAL DATA:  Acute kidney injury EXAM: RENAL / URINARY TRACT ULTRASOUND COMPLETE COMPARISON:  Ultrasound dated March 12, 2020.  MRI dated February 16, 2020 FINDINGS: Right Kidney: Renal measurements: 10.4.1 x 6.5 cm = volume: 150 mL. There is no hydronephrosis. The echogenicity is increased. There is a 1.5 cm hypoechoic lesion in the upper pole that is favored to represent a cyst. Left Kidney: Renal measurements: 12.1 x 3.7 x 4.3 cm = volume: 101.4 mL. The echogenicity is increased. There is no hydronephrosis. There is a prominent extrarenal pelvis. Again noted is a cystic appearing lesion involving the interpolar region measuring approximately 1.6 cm Bladder: There is a  questionable filling defect within the urinary bladder near the right UVJ. Other: None. IMPRESSION: 1. No hydronephrosis. 2. Echogenic kidneys bilaterally which  can be seen in patients with medical renal disease. 3. Questionable filling defect in the urinary bladder at the level of the right UVJ. Outpatient urology follow-up is recommended for this finding. Electronically Signed   By: Constance Holster M.D.   On: 01/11/2021 00:01   NM Myocar Multi W/Spect W/Wall Motion / EF  Result Date: 01/12/2021  There was no ST segment deviation noted during stress.  No T wave inversion was noted during stress.  The study is normal.  This is a low risk study.  The left ventricular ejection fraction is hyperdynamic (>65%).  Nuclear stress EF: 83%.  Normal stress nuclear study with no ischemia or infarction.  Gated ejection fraction 83% with normal wall motion.   NM PET Image Initial (PI) Skull Base To Thigh  Result Date: 01/16/2021 CLINICAL DATA:  Initial treatment strategy for lung mass. EXAM: NUCLEAR MEDICINE PET SKULL BASE TO THIGH TECHNIQUE: 5.8 mCi F-18 FDG was injected intravenously. Full-ring PET imaging was performed from the skull base to thigh after the radiotracer. CT data was obtained and used for attenuation correction and anatomic localization. Fasting blood glucose: 250 mg/dl COMPARISON:  01/02/2021 FINDINGS: Mediastinal blood pool activity: SUV max 2.2 Liver activity: SUV max NA NECK: No hypermetabolic lymph nodes in the neck. Incidental CT findings: none CHEST: Lobulated mass in the RIGHT middle lobe measures 6.5 x 4.1 cm with intense metabolic activity (SUV max equal 12.2). Mass abuts the horizontal fissure and oblique fissure. Hypermetabolic subcarinal lymph node measures 12 mm short axis with SUV max equal 7.1 no paratracheal adenopathy. No supraclavicular adenopathy. Incidental CT findings: Coronary artery calcification and aortic atherosclerotic calcification. Calcified hilar nodes and calcified pulmonary granulomas. ABDOMEN/PELVIS: No abnormal hypermetabolic activity within the liver, pancreas, adrenal glands, or spleen. No hypermetabolic lymph nodes  in the abdomen or pelvis. Incidental CT findings: Atherosclerotic calcification of the aorta. Postcholecystectomy. Hysterectomy. SKELETON: No focal hypermetabolic activity to suggest skeletal metastasis. Incidental CT findings: none IMPRESSION: 1. Hypermetabolic mass in the RIGHT middle lobe consistent with primary bronchogenic carcinoma. Mass abuts the horizontal fissure and oblique fissure. 2. Mediastinal nodal metastasis to the subcarinal lymph node. 3. FDG PET staging: T3 N2 m0 4. No distant metastatic disease Electronically Signed   By: Suzy Bouchard M.D.   On: 01/16/2021 09:43    Assessment/Plan  1. Real time reverse transcriptase PCR positive for COVID-19 virus -   treated with Decadron, zinc sulfate, vitamin C, guaifenesin and doxycycline x10 days -    Completed isolation  2. Mass of right lung -  For bronchoscopy on 02/16/2021 -To stop Trental 400 mg 5 days (02/11/21) prior and ASA 2 days prior (02/14/21) -  Follow up with pulmonary  3. Chronic obstructive pulmonary disease, unspecified COPD type (HCC) - albuterol (VENTOLIN HFA) 108 (90 Base) MCG/ACT inhaler; Inhale 2 puffs into the lungs every 2 (two) hours as needed for wheezing or shortness of breath. Please instruct in usage  Dispense: 1 each; Refill: 2 - Tiotropium Bromide-Olodaterol (STIOLTO RESPIMAT) 2.5-2.5 MCG/ACT AERS; Inhale 2 puffs into the lungs daily.  Dispense: 4 g; Refill: 0  4. Malignant neoplasm of upper-outer quadrant of left breast in female, estrogen receptor positive (HCC) - anastrozole (ARIMIDEX) 1 MG tablet; Take 1 tablet (1 mg total) by mouth daily.  Dispense: 30 tablet; Refill: 0  5. Type 2 diabetes mellitus with other specified complication, without  long-term current use of insulin (HCC) - metFORMIN (GLUCOPHAGE) 500 MG tablet; Take 1 tablet (500 mg total) by mouth daily.  Dispense: 30 tablet; Refill: 0 - pioglitazone (ACTOS) 45 MG tablet; Take 1 tablet (45 mg total) by mouth daily.  Dispense: 30 tablet;  Refill: 0  6. Tobacco abuse - nicotine (NICODERM CQ - DOSED IN MG/24 HOURS) 14 mg/24hr patch; Place 1 patch (14 mg total) onto the skin daily.  Dispense: 28 patch; Refill: 0     I have filled out patient's discharge paperwork and e-prescribed medications.  Patient will have home health PT, OT and Aide.  DME provided:  Semi-electric hospital bed and 3-in-1  Semielectric hospital bed -patient suffers from COPD and has trouble breathing at night when head is elevated less than 30 degrees or more.  Bed wedges do not provide enough elevation to resolve breathing issues.  Shortness of breath cause patient to require frequent and immediate changes in body position which cannot be achieved with a normal bed.   Total discharge time: Greater than 30 minutes  Greater than 50% was spent in counseling and coordination of care.   Discharge time involved coordination of the discharge process with social worker, nursing staff and therapy department. Medical justification for home health services/DME verified.   Durenda Age, DNP, MSN, FNP-BC [name] [phone] (Monday-Friday 8:00 a.m. - 5:00 p.m.) [phone] (after hours)

## 2021-02-07 DIAGNOSIS — C50412 Malignant neoplasm of upper-outer quadrant of left female breast: Secondary | ICD-10-CM | POA: Diagnosis not present

## 2021-02-07 DIAGNOSIS — E1165 Type 2 diabetes mellitus with hyperglycemia: Secondary | ICD-10-CM | POA: Diagnosis not present

## 2021-02-07 DIAGNOSIS — E1169 Type 2 diabetes mellitus with other specified complication: Secondary | ICD-10-CM | POA: Diagnosis not present

## 2021-02-07 DIAGNOSIS — U071 COVID-19: Secondary | ICD-10-CM | POA: Diagnosis not present

## 2021-02-07 DIAGNOSIS — D630 Anemia in neoplastic disease: Secondary | ICD-10-CM | POA: Diagnosis not present

## 2021-02-07 DIAGNOSIS — I10 Essential (primary) hypertension: Secondary | ICD-10-CM | POA: Diagnosis not present

## 2021-02-07 DIAGNOSIS — M1990 Unspecified osteoarthritis, unspecified site: Secondary | ICD-10-CM | POA: Diagnosis not present

## 2021-02-07 DIAGNOSIS — D381 Neoplasm of uncertain behavior of trachea, bronchus and lung: Secondary | ICD-10-CM | POA: Diagnosis not present

## 2021-02-07 DIAGNOSIS — R413 Other amnesia: Secondary | ICD-10-CM | POA: Diagnosis not present

## 2021-02-07 DIAGNOSIS — R627 Adult failure to thrive: Secondary | ICD-10-CM | POA: Diagnosis not present

## 2021-02-07 DIAGNOSIS — E785 Hyperlipidemia, unspecified: Secondary | ICD-10-CM | POA: Diagnosis not present

## 2021-02-07 DIAGNOSIS — E876 Hypokalemia: Secondary | ICD-10-CM | POA: Diagnosis not present

## 2021-02-07 DIAGNOSIS — J449 Chronic obstructive pulmonary disease, unspecified: Secondary | ICD-10-CM | POA: Diagnosis not present

## 2021-02-07 MED ORDER — METFORMIN HCL 500 MG PO TABS: 500.0000 mg | ORAL_TABLET | Freq: Every day | ORAL | 0 refills | Status: DC

## 2021-02-07 MED ORDER — PENTOXIFYLLINE ER 400 MG PO TBCR: 400.0000 mg | EXTENDED_RELEASE_TABLET | Freq: Three times a day (TID) | ORAL | 0 refills | Status: DC

## 2021-02-07 MED ORDER — ALBUTEROL SULFATE HFA 108 (90 BASE) MCG/ACT IN AERS: 2.0000 | INHALATION_SPRAY | RESPIRATORY_TRACT | 2 refills | Status: AC | PRN

## 2021-02-07 MED ORDER — ANASTROZOLE 1 MG PO TABS: 1.0000 mg | ORAL_TABLET | Freq: Every day | ORAL | 0 refills | Status: DC

## 2021-02-07 MED ORDER — PANTOPRAZOLE SODIUM 40 MG PO TBEC: 40.0000 mg | DELAYED_RELEASE_TABLET | Freq: Every day | ORAL | 0 refills | Status: DC

## 2021-02-07 MED ORDER — PIOGLITAZONE HCL 45 MG PO TABS: 45.0000 mg | ORAL_TABLET | Freq: Every day | ORAL | 0 refills | Status: DC

## 2021-02-07 MED ORDER — NICOTINE 14 MG/24HR TD PT24: 14.0000 mg | MEDICATED_PATCH | Freq: Every day | TRANSDERMAL | 0 refills | Status: DC

## 2021-02-07 MED ORDER — STIOLTO RESPIMAT 2.5-2.5 MCG/ACT IN AERS
2.0000 | INHALATION_SPRAY | Freq: Every day | RESPIRATORY_TRACT | 0 refills | Status: DC
Start: 2021-02-07 — End: 2021-02-13

## 2021-02-07 MED ORDER — ROSUVASTATIN CALCIUM 40 MG PO TABS: 40.0000 mg | ORAL_TABLET | Freq: Every day | ORAL | 0 refills | Status: AC

## 2021-02-08 ENCOUNTER — Other Ambulatory Visit: Payer: Self-pay | Admitting: Adult Health

## 2021-02-08 DIAGNOSIS — E1169 Type 2 diabetes mellitus with other specified complication: Secondary | ICD-10-CM

## 2021-02-10 ENCOUNTER — Telehealth: Payer: Self-pay | Admitting: *Deleted

## 2021-02-10 ENCOUNTER — Other Ambulatory Visit: Payer: Self-pay | Admitting: Adult Health

## 2021-02-10 DIAGNOSIS — M1990 Unspecified osteoarthritis, unspecified site: Secondary | ICD-10-CM | POA: Diagnosis not present

## 2021-02-10 DIAGNOSIS — E876 Hypokalemia: Secondary | ICD-10-CM | POA: Diagnosis not present

## 2021-02-10 DIAGNOSIS — C50412 Malignant neoplasm of upper-outer quadrant of left female breast: Secondary | ICD-10-CM

## 2021-02-10 DIAGNOSIS — E1165 Type 2 diabetes mellitus with hyperglycemia: Secondary | ICD-10-CM | POA: Diagnosis not present

## 2021-02-10 DIAGNOSIS — E1169 Type 2 diabetes mellitus with other specified complication: Secondary | ICD-10-CM

## 2021-02-10 DIAGNOSIS — R413 Other amnesia: Secondary | ICD-10-CM | POA: Diagnosis not present

## 2021-02-10 DIAGNOSIS — U071 COVID-19: Secondary | ICD-10-CM | POA: Diagnosis not present

## 2021-02-10 DIAGNOSIS — R918 Other nonspecific abnormal finding of lung field: Secondary | ICD-10-CM

## 2021-02-10 DIAGNOSIS — R627 Adult failure to thrive: Secondary | ICD-10-CM | POA: Diagnosis not present

## 2021-02-10 DIAGNOSIS — D630 Anemia in neoplastic disease: Secondary | ICD-10-CM | POA: Diagnosis not present

## 2021-02-10 DIAGNOSIS — E785 Hyperlipidemia, unspecified: Secondary | ICD-10-CM | POA: Diagnosis not present

## 2021-02-10 DIAGNOSIS — I10 Essential (primary) hypertension: Secondary | ICD-10-CM | POA: Diagnosis not present

## 2021-02-10 DIAGNOSIS — D381 Neoplasm of uncertain behavior of trachea, bronchus and lung: Secondary | ICD-10-CM | POA: Diagnosis not present

## 2021-02-10 DIAGNOSIS — J449 Chronic obstructive pulmonary disease, unspecified: Secondary | ICD-10-CM | POA: Diagnosis not present

## 2021-02-10 NOTE — Telephone Encounter (Signed)
I followed up on Dawn [name]'s schedule.  She is getting her bx on 6/6. I called and spoke to her son and scheduled her a follow up with Cassie and Dr. Julien Nordmann on 6/9.  He verbalized understanding of appt

## 2021-02-11 ENCOUNTER — Other Ambulatory Visit: Payer: Self-pay

## 2021-02-11 ENCOUNTER — Encounter: Payer: Self-pay | Admitting: Emergency Medicine

## 2021-02-11 ENCOUNTER — Ambulatory Visit: Payer: Medicare Other | Admitting: Emergency Medicine

## 2021-02-11 DIAGNOSIS — J449 Chronic obstructive pulmonary disease, unspecified: Secondary | ICD-10-CM

## 2021-02-11 DIAGNOSIS — C3491 Malignant neoplasm of unspecified part of right bronchus or lung: Secondary | ICD-10-CM | POA: Diagnosis not present

## 2021-02-11 MED ORDER — STIOLTO RESPIMAT 2.5-2.5 MCG/ACT IN AERS: 2.0000 | INHALATION_SPRAY | Freq: Every day | RESPIRATORY_TRACT | 0 refills | Status: DC

## 2021-02-11 NOTE — Patient Instructions (Signed)
We will plan to proceed with bronchoscopy on 02/16/2021. Stop her Trental 5 days prior to the procedure. We will restart Stiolto 2 puffs once daily. Keep albuterol available to use 2 puffs when needed for shortness of breath, chest tightness, wheezing. Okay to continue to use ibuprofen for chest discomfort if needed. Follow with Dr Lamonte Sakai in 1 month

## 2021-02-11 NOTE — Progress Notes (Signed)
Subjective:    Patient ID: Dawn [name], female    DOB: [birth year], 80 y.o.   MRN: [id number]  HPI 80 year old active smoker (70 pack years) with history of breast cancer (left partial mastectomy 2017, XRT, maintained on anastrozole), hypertension, hyperlipidemia, diabetes type 2, anxiety, COPD.  I do not have any PFT available.  She was seen by our group when she was admitted end of March 2022 with weakness, poor p.o. intake, memory loss and dyspnea, found to be anemic.  Chest imaging was performed that revealed a right middle lobe mass.  Bronchoscopy was discussed and decision was made to defer until we could meet here in the office.  She is improved since the hospital - able to ambulate, less shakiness. Still w poor appetite, her breathing is improved.   CT chest 12/04/2020 reviewed by me showed a 6 x 3.3 cm low attenuating right middle lobe mass extending from the hilum to the lateral pleural surface superimposed on background emphysema.  There is also a 1 cm pleural-based nodule.  No mediastinal or hilar lymphadenopathy.  MRI brain 12/06/2020 reviewed by me, showed no evidence of intracranial metastatic disease, some chronic small vessel ischemia and remote lacunar infarcts  ROV 02/11/21 --follow-up visit for 80 year old active smoker with a history as above including breast cancer, hypertension, COPD, diabetes.  She also has significant dementia with little insight into her medical condition.  I have met her to evaluate a right middle lobe mass.  We schedule bronchoscopy twice but they had to be canceled due to her clinical condition, including most recently chance diagnosis with COVID-19 01/22/2021.  I currently have her scheduled for 02/16/2021 for bronchoscopy. She is on Trental. She is having some lethargy, some exertional SOB. She has had some mid chest discomfort, has benefited from ibuprofen. Also has been given NTG, possibly some relief. She is not currently on scheduled stiolto. She uses  albuterol 2x a day, probably some relief.   PET scan 01/16/2021 shows hypermetabolic right middle lobe mass consistent with primary lung cancer that abuts the horizontal fissure, mediastinal nodal metastatic disease at station 7.   Review of Systems as per HPI      Objective:   Physical Exam Vitals:   02/11/21 1026  BP: (!) 96/58  Pulse: 89  Temp: (!) 97.5 F (36.4 C)  TempSrc: Temporal  SpO2: 95%  Weight: 115 lb (52.2 kg)  Height: _0  (1.702 m)   Gen: Pleasant, well-nourished, in no distress, somewhat depressed affect  ENT: No lesions,  mouth clear,  oropharynx clear, no postnasal drip  Neck: No JVD, no stridor  Lungs: No use of accessory muscles, distant, no crackles or wheezing on normal respiration, no wheeze on forced expiration  Cardiovascular: RRR, heart sounds normal, no murmur or gallops, no peripheral edema  Musculoskeletal: No deformities, no cyanosis or clubbing  Neuro: alert, awake, more quiet today, not interacting as much.  Fully oriented, no insight into her medical condition.  Skin: Warm, no lesions or rash      Assessment & Plan:  Primary lung cancer, right ([place]) Right middle lobe mass with evidence for mediastinal adenopathy.  She has poor functional capacity but there is a strong push to get a tissue diagnosis and talk about possible therapeutic options.  We have had to cancel bronchoscopy twice.  She is scheduled for 02/16/2021.  She is overall debilitated but I do not see any absolute barriers.  Discussed this with her son Dawn [name] today.  Planning to  proceed on Monday.  She will stop Trental all 5 days prior.  She has follow-up with Dr. Julien Nordmann in oncology 2 days later.   COPD (chronic obstructive pulmonary disease) ([place]) She has not smoked in 30 days.  She had COVID-19 but no real pulmonary manifestations.  She is off Stiolto right now we will try to restart it.  She has albuterol that she can use as needed.  Continue same.   Baltazar Apo, MD,  PhD 02/11/2021, 10:52 AM [facility] [phone] or if no answer before 7:00PM call [phone] For any issues after 7:00PM please call eLink [phone]

## 2021-02-11 NOTE — Assessment & Plan Note (Signed)
Right middle lobe mass with evidence for mediastinal adenopathy.  She has poor functional capacity but there is a strong push to get a tissue diagnosis and talk about possible therapeutic options.  We have had to cancel bronchoscopy twice.  She is scheduled for 02/16/2021.  She is overall debilitated but I do not see any absolute barriers.  Discussed this with her son Dawn [name] today.  Planning to proceed on Monday.  She will stop Trental all 5 days prior.  She has follow-up with Dr. Julien Nordmann in oncology 2 days later.

## 2021-02-11 NOTE — Addendum Note (Signed)
Addended by: Gavin Potters R on: 02/11/2021 11:04 AM   Modules accepted: Orders

## 2021-02-11 NOTE — Assessment & Plan Note (Addendum)
She has not smoked in 30 days.  She had COVID-19 but no real pulmonary manifestations.  She is off Stiolto right now we will try to restart it.  She has albuterol that she can use as needed.  Continue same.

## 2021-02-11 NOTE — H&P (View-Only) (Signed)
 Subjective:    Patient ID: Dawn [name], female    DOB: 10/25/1940, 79 y.o.   MRN: [id number]  HPI 79-year-old active smoker (70 pack years) with history of breast cancer (left partial mastectomy 2017, XRT, maintained on anastrozole), hypertension, hyperlipidemia, diabetes type 2, anxiety, COPD.  I do not have any PFT available.  She was seen by our group when she was admitted end of March 2022 with weakness, poor p.o. intake, memory loss and dyspnea, found to be anemic.  Chest imaging was performed that revealed a right middle lobe mass.  Bronchoscopy was discussed and decision was made to defer until we could meet here in the office.  She is improved since the hospital - able to ambulate, less shakiness. Still w poor appetite, her breathing is improved.   CT chest 12/04/2020 reviewed by me showed a 6 x 3.3 cm low attenuating right middle lobe mass extending from the hilum to the lateral pleural surface superimposed on background emphysema.  There is also a 1 cm pleural-based nodule.  No mediastinal or hilar lymphadenopathy.  MRI brain 12/06/2020 reviewed by me, showed no evidence of intracranial metastatic disease, some chronic small vessel ischemia and remote lacunar infarcts  ROV 02/11/21 --follow-up visit for 79-year-old active smoker with a history as above including breast cancer, hypertension, COPD, diabetes.  She also has significant dementia with little insight into her medical condition.  I have met her to evaluate a right middle lobe mass.  We schedule bronchoscopy twice but they had to be canceled due to her clinical condition, including most recently chance diagnosis with COVID-19 01/22/2021.  I currently have her scheduled for 02/16/2021 for bronchoscopy. She is on Trental. She is having some lethargy, some exertional SOB. She has had some mid chest discomfort, has benefited from ibuprofen. Also has been given NTG, possibly some relief. She is not currently on scheduled stiolto. She uses  albuterol 2x a day, probably some relief.   PET scan 01/16/2021 shows hypermetabolic right middle lobe mass consistent with primary lung cancer that abuts the horizontal fissure, mediastinal nodal metastatic disease at station 7.   Review of Systems as per HPI      Objective:   Physical Exam Vitals:   02/11/21 1026  BP: (!) 96/58  Pulse: 89  Temp: (!) 97.5 F (36.4 C)  TempSrc: Temporal  SpO2: 95%  Weight: 115 lb (52.2 kg)  Height: 5' 7" (1.702 m)   Gen: Pleasant, well-nourished, in no distress, somewhat depressed affect  ENT: No lesions,  mouth clear,  oropharynx clear, no postnasal drip  Neck: No JVD, no stridor  Lungs: No use of accessory muscles, distant, no crackles or wheezing on normal respiration, no wheeze on forced expiration  Cardiovascular: RRR, heart sounds normal, no murmur or gallops, no peripheral edema  Musculoskeletal: No deformities, no cyanosis or clubbing  Neuro: alert, awake, more quiet today, not interacting as much.  Fully oriented, no insight into her medical condition.  Skin: Warm, no lesions or rash      Assessment & Plan:  Primary lung cancer, right (HCC) Right middle lobe mass with evidence for mediastinal adenopathy.  She has poor functional capacity but there is a strong push to get a tissue diagnosis and talk about possible therapeutic options.  We have had to cancel bronchoscopy twice.  She is scheduled for 02/16/2021.  She is overall debilitated but I do not see any absolute barriers.  Discussed this with her son Don today.  Planning to   proceed on Monday.  She will stop Trental all 5 days prior.  She has follow-up with Dr. Mohamed in oncology 2 days later.   COPD (chronic obstructive pulmonary disease) (HCC) She has not smoked in 30 days.  She had COVID-19 but no real pulmonary manifestations.  She is off Stiolto right now we will try to restart it.  She has albuterol that she can use as needed.  Continue same.   Altair Appenzeller, MD,  PhD 02/11/2021, 10:52 AM [facility] [phone] or if no answer before 7:00PM call [phone] For any issues after 7:00PM please call eLink [phone]  

## 2021-02-12 ENCOUNTER — Inpatient Hospital Stay (HOSPITAL_COMMUNITY)
Admission: EM | Admit: 2021-02-12 | Discharge: 2021-02-14 | DRG: 190 | Disposition: A | Discharge status: Home Health | Payer: Medicare Other | Attending: Student | Admitting: Student

## 2021-02-12 ENCOUNTER — Other Ambulatory Visit: Payer: Self-pay

## 2021-02-12 ENCOUNTER — Encounter (HOSPITAL_COMMUNITY): Payer: Self-pay | Admitting: Emergency Medicine

## 2021-02-12 ENCOUNTER — Emergency Department (HOSPITAL_COMMUNITY): Payer: Medicare Other

## 2021-02-12 DIAGNOSIS — D630 Anemia in neoplastic disease: Secondary | ICD-10-CM | POA: Diagnosis not present

## 2021-02-12 DIAGNOSIS — E871 Hypo-osmolality and hyponatremia: Secondary | ICD-10-CM | POA: Diagnosis not present

## 2021-02-12 DIAGNOSIS — N1831 Chronic kidney disease, stage 3a: Secondary | ICD-10-CM | POA: Diagnosis not present

## 2021-02-12 DIAGNOSIS — R918 Other nonspecific abnormal finding of lung field: Secondary | ICD-10-CM | POA: Diagnosis not present

## 2021-02-12 DIAGNOSIS — F32A Depression, unspecified: Secondary | ICD-10-CM | POA: Diagnosis present

## 2021-02-12 DIAGNOSIS — N179 Acute kidney failure, unspecified: Secondary | ICD-10-CM | POA: Diagnosis not present

## 2021-02-12 DIAGNOSIS — E1165 Type 2 diabetes mellitus with hyperglycemia: Secondary | ICD-10-CM | POA: Diagnosis not present

## 2021-02-12 DIAGNOSIS — E861 Hypovolemia: Secondary | ICD-10-CM | POA: Diagnosis present

## 2021-02-12 DIAGNOSIS — M4854XA Collapsed vertebra, not elsewhere classified, thoracic region, initial encounter for fracture: Secondary | ICD-10-CM | POA: Diagnosis present

## 2021-02-12 DIAGNOSIS — E1122 Type 2 diabetes mellitus with diabetic chronic kidney disease: Secondary | ICD-10-CM | POA: Diagnosis present

## 2021-02-12 DIAGNOSIS — D649 Anemia, unspecified: Secondary | ICD-10-CM | POA: Diagnosis not present

## 2021-02-12 DIAGNOSIS — I959 Hypotension, unspecified: Secondary | ICD-10-CM | POA: Diagnosis present

## 2021-02-12 DIAGNOSIS — J9811 Atelectasis: Secondary | ICD-10-CM | POA: Diagnosis not present

## 2021-02-12 DIAGNOSIS — Z853 Personal history of malignant neoplasm of breast: Secondary | ICD-10-CM

## 2021-02-12 DIAGNOSIS — Z20822 Contact with and (suspected) exposure to covid-19: Secondary | ICD-10-CM | POA: Diagnosis present

## 2021-02-12 DIAGNOSIS — F419 Anxiety disorder, unspecified: Secondary | ICD-10-CM | POA: Diagnosis present

## 2021-02-12 DIAGNOSIS — J9601 Acute respiratory failure with hypoxia: Secondary | ICD-10-CM | POA: Diagnosis present

## 2021-02-12 DIAGNOSIS — U071 COVID-19: Secondary | ICD-10-CM | POA: Diagnosis not present

## 2021-02-12 DIAGNOSIS — Z7984 Long term (current) use of oral hypoglycemic drugs: Secondary | ICD-10-CM

## 2021-02-12 DIAGNOSIS — Z09 Encounter for follow-up examination after completed treatment for conditions other than malignant neoplasm: Secondary | ICD-10-CM | POA: Diagnosis not present

## 2021-02-12 DIAGNOSIS — E7800 Pure hypercholesterolemia, unspecified: Secondary | ICD-10-CM | POA: Diagnosis present

## 2021-02-12 DIAGNOSIS — E119 Type 2 diabetes mellitus without complications: Secondary | ICD-10-CM

## 2021-02-12 DIAGNOSIS — R627 Adult failure to thrive: Secondary | ICD-10-CM | POA: Diagnosis not present

## 2021-02-12 DIAGNOSIS — Z515 Encounter for palliative care: Secondary | ICD-10-CM

## 2021-02-12 DIAGNOSIS — Z79899 Other long term (current) drug therapy: Secondary | ICD-10-CM

## 2021-02-12 DIAGNOSIS — I10 Essential (primary) hypertension: Secondary | ICD-10-CM | POA: Diagnosis not present

## 2021-02-12 DIAGNOSIS — J449 Chronic obstructive pulmonary disease, unspecified: Secondary | ICD-10-CM | POA: Diagnosis not present

## 2021-02-12 DIAGNOSIS — I129 Hypertensive chronic kidney disease with stage 1 through stage 4 chronic kidney disease, or unspecified chronic kidney disease: Secondary | ICD-10-CM | POA: Diagnosis present

## 2021-02-12 DIAGNOSIS — D638 Anemia in other chronic diseases classified elsewhere: Secondary | ICD-10-CM | POA: Diagnosis present

## 2021-02-12 DIAGNOSIS — Z7982 Long term (current) use of aspirin: Secondary | ICD-10-CM

## 2021-02-12 DIAGNOSIS — Z923 Personal history of irradiation: Secondary | ICD-10-CM

## 2021-02-12 DIAGNOSIS — C3490 Malignant neoplasm of unspecified part of unspecified bronchus or lung: Secondary | ICD-10-CM | POA: Diagnosis not present

## 2021-02-12 DIAGNOSIS — Z9049 Acquired absence of other specified parts of digestive tract: Secondary | ICD-10-CM

## 2021-02-12 DIAGNOSIS — Z87891 Personal history of nicotine dependence: Secondary | ICD-10-CM

## 2021-02-12 DIAGNOSIS — Z9981 Dependence on supplemental oxygen: Secondary | ICD-10-CM

## 2021-02-12 DIAGNOSIS — E876 Hypokalemia: Secondary | ICD-10-CM | POA: Diagnosis not present

## 2021-02-12 DIAGNOSIS — R0602 Shortness of breath: Secondary | ICD-10-CM | POA: Diagnosis not present

## 2021-02-12 DIAGNOSIS — I739 Peripheral vascular disease, unspecified: Secondary | ICD-10-CM | POA: Diagnosis not present

## 2021-02-12 DIAGNOSIS — E785 Hyperlipidemia, unspecified: Secondary | ICD-10-CM | POA: Diagnosis present

## 2021-02-12 DIAGNOSIS — R5381 Other malaise: Secondary | ICD-10-CM | POA: Diagnosis not present

## 2021-02-12 DIAGNOSIS — Z9889 Other specified postprocedural states: Secondary | ICD-10-CM

## 2021-02-12 DIAGNOSIS — R531 Weakness: Secondary | ICD-10-CM | POA: Diagnosis not present

## 2021-02-12 DIAGNOSIS — C50412 Malignant neoplasm of upper-outer quadrant of left female breast: Secondary | ICD-10-CM | POA: Diagnosis not present

## 2021-02-12 DIAGNOSIS — F0390 Unspecified dementia without behavioral disturbance: Secondary | ICD-10-CM | POA: Diagnosis present

## 2021-02-12 DIAGNOSIS — R413 Other amnesia: Secondary | ICD-10-CM | POA: Diagnosis not present

## 2021-02-12 DIAGNOSIS — Z887 Allergy status to serum and vaccine status: Secondary | ICD-10-CM

## 2021-02-12 DIAGNOSIS — E782 Mixed hyperlipidemia: Secondary | ICD-10-CM | POA: Diagnosis not present

## 2021-02-12 DIAGNOSIS — Z8616 Personal history of COVID-19: Secondary | ICD-10-CM | POA: Diagnosis not present

## 2021-02-12 DIAGNOSIS — Z7189 Other specified counseling: Secondary | ICD-10-CM | POA: Diagnosis not present

## 2021-02-12 DIAGNOSIS — Z90710 Acquired absence of both cervix and uterus: Secondary | ICD-10-CM

## 2021-02-12 DIAGNOSIS — E1169 Type 2 diabetes mellitus with other specified complication: Secondary | ICD-10-CM | POA: Diagnosis not present

## 2021-02-12 DIAGNOSIS — Z888 Allergy status to other drugs, medicaments and biological substances status: Secondary | ICD-10-CM

## 2021-02-12 DIAGNOSIS — J90 Pleural effusion, not elsewhere classified: Secondary | ICD-10-CM | POA: Diagnosis not present

## 2021-02-12 DIAGNOSIS — S22080A Wedge compression fracture of T11-T12 vertebra, initial encounter for closed fracture: Secondary | ICD-10-CM | POA: Diagnosis not present

## 2021-02-12 DIAGNOSIS — R1013 Epigastric pain: Secondary | ICD-10-CM | POA: Diagnosis not present

## 2021-02-12 DIAGNOSIS — I700 Atherosclerosis of aorta: Secondary | ICD-10-CM | POA: Diagnosis present

## 2021-02-12 DIAGNOSIS — M549 Dorsalgia, unspecified: Secondary | ICD-10-CM | POA: Diagnosis not present

## 2021-02-12 DIAGNOSIS — J441 Chronic obstructive pulmonary disease with (acute) exacerbation: Principal | ICD-10-CM | POA: Diagnosis present

## 2021-02-12 DIAGNOSIS — R079 Chest pain, unspecified: Secondary | ICD-10-CM | POA: Diagnosis not present

## 2021-02-12 DIAGNOSIS — M1990 Unspecified osteoarthritis, unspecified site: Secondary | ICD-10-CM | POA: Diagnosis not present

## 2021-02-12 DIAGNOSIS — D381 Neoplasm of uncertain behavior of trachea, bronchus and lung: Secondary | ICD-10-CM | POA: Diagnosis not present

## 2021-02-12 DIAGNOSIS — Z801 Family history of malignant neoplasm of trachea, bronchus and lung: Secondary | ICD-10-CM

## 2021-02-12 LAB — COMPREHENSIVE METABOLIC PANEL
ALT: 10 U/L (ref 0–44)
AST: 15 U/L (ref 15–41)
Albumin: 2 g/dL — ABNORMAL LOW (ref 3.5–5.0)
Alkaline Phosphatase: 65 U/L (ref 38–126)
Anion gap: 8 (ref 5–15)
BUN: 17 mg/dL (ref 8–23)
CO2: 28 mmol/L (ref 22–32)
Calcium: 8.8 mg/dL — ABNORMAL LOW (ref 8.9–10.3)
Chloride: 93 mmol/L — ABNORMAL LOW (ref 98–111)
Creatinine, Ser: 1.08 mg/dL — ABNORMAL HIGH (ref 0.44–1.00)
GFR, Estimated: 53 mL/min — ABNORMAL LOW (ref >60)
Glucose, Bld: 207 mg/dL — ABNORMAL HIGH (ref 70–99)
Potassium: 4.1 mmol/L (ref 3.5–5.1)
Sodium: 129 mmol/L — ABNORMAL LOW (ref 135–145)
Total Bilirubin: 0.4 mg/dL (ref 0.3–1.2)
Total Protein: 5.1 g/dL — ABNORMAL LOW (ref 6.5–8.1)

## 2021-02-12 LAB — CBC WITH DIFFERENTIAL/PLATELET
Abs Immature Granulocytes: 0.05 10*3/uL (ref 0.00–0.07)
Basophils Absolute: 0 10*3/uL (ref 0.0–0.1)
Basophils Relative: 0 %
Eosinophils Absolute: 0 10*3/uL (ref 0.0–0.5)
Eosinophils Relative: 0 %
HCT: 23.3 % — ABNORMAL LOW (ref 36.0–46.0)
Hemoglobin: 6.8 g/dL — CL (ref 12.0–15.0)
Immature Granulocytes: 1 %
Lymphocytes Relative: 18 %
Lymphs Abs: 1.3 10*3/uL (ref 0.7–4.0)
MCH: 26.9 pg (ref 26.0–34.0)
MCHC: 29.2 g/dL — ABNORMAL LOW (ref 30.0–36.0)
MCV: 92.1 fL (ref 80.0–100.0)
Monocytes Absolute: 0.3 10*3/uL (ref 0.1–1.0)
Monocytes Relative: 5 %
Neutro Abs: 5.8 10*3/uL (ref 1.7–7.7)
Neutrophils Relative %: 76 %
Platelets: 380 10*3/uL (ref 150–400)
RBC: 2.53 MIL/uL — ABNORMAL LOW (ref 3.87–5.11)
RDW: 22.2 % — ABNORMAL HIGH (ref 11.5–15.5)
WBC: 7.6 10*3/uL (ref 4.0–10.5)
nRBC: 0 % (ref 0.0–0.2)

## 2021-02-12 LAB — LIPASE, BLOOD: Lipase: 27 U/L (ref 11–51)

## 2021-02-12 LAB — PREPARE RBC (CROSSMATCH)

## 2021-02-12 LAB — POC OCCULT BLOOD, ED: Fecal Occult Bld: NEGATIVE

## 2021-02-12 LAB — TROPONIN I (HIGH SENSITIVITY): Troponin I (High Sensitivity): 10 ng/L (ref <18)

## 2021-02-12 MED ORDER — SODIUM CHLORIDE 0.9 % IV SOLN
10.0000 mL/h | Freq: Once | INTRAVENOUS | Status: AC
  Administered 2021-02-13: 10 mL/h via INTRAVENOUS

## 2021-02-12 NOTE — ED Notes (Signed)
Per son, patient hasn't had BM in over a week.

## 2021-02-12 NOTE — ED Provider Notes (Signed)
Emergency Medicine Provider Triage Evaluation Note  Dawn [name] , a 80 y.o. female  was evaluated in triage.  Pt complains of generalized weakness and upper abdomen/lower chest pain.  Triage note states that she was sent in for low hemoglobin.  Review of Systems  Positive: Generalized weakness and pain Negative: Syncope  Physical Exam  BP (!) 86/42 (BP Location: Left Arm)   Pulse 89   Temp 97.7 F (36.5 C) (Oral)   Resp 15   SpO2 95%  Gen:   Awake, no distress Resp:  Normal effort MSK:   Moves extremities without difficulty Other:  Appears pale and frail  Medical Decision Making  Medically screening exam initiated at 6:48 PM.  Appropriate orders placed.  [name] was informed that the remainder of the evaluation will be completed by another provider, this initial triage assessment does not replace that evaluation, and the importance of remaining in the ED until their evaluation is complete.  Will order lab work including type and screen, EKG and troponin   [name], PA-C 02/12/21 1849    Blanchie Dessert, MD 02/13/21 [phone]

## 2021-02-12 NOTE — ED Triage Notes (Signed)
Pt reports generalized weakness with chest pain and epigastric pain. Pt came in for low hemoglobin per doctor. BP in triage 86/42.

## 2021-02-12 NOTE — Progress Notes (Signed)
Spoke with pt's son, [name] for pre-op call. DPR on file. He states pt does not have a cardiac history. Pt is diabetic. Last A1C was 6.5 on 12/05/20. He states he does not know what her fasting blood sugar usually runs. Instructed Don that pt is not to take her Actos or Metformin the morning of surgery. Pt is on Trental, note from Dr. Lamonte Sakai stated that pt is to hold 5 days prior to procedure. Last dose was   Pt tested positive for Covid on 01/22/21. No Covid test needed.

## 2021-02-12 NOTE — ED Provider Notes (Signed)
[facility] EMERGENCY DEPARTMENT Provider Note   CSN: [id number] Arrival date & time: 02/12/21  1814     History Chief Complaint  Patient presents with  . Abnormal Lab  . Weakness    Dawn [name] is a 80 y.o. female who presents for evaluation of generalized weakness, anemia.  Patient with history of cancer in her lung.  She has had anemia previously.  She went to go see her doctor today and she was noted to be hypotensive with systolic blood pressure in the 80s.  They did some blood work and called patient states that her hemoglobin was low and that she needed to come to the emergency department.  Patient does report she has had some generalized weakness.  She also has had intermittent chest pain and shortness of breath which son states has been ongoing since she was diagnosed with lung cancer.  She is not currently on any blood thinners.  She has not noted any blood in her stools, bleeding from her gums.  She occasionally will have some abdominal pain. No fevers, n/v.   The history is provided by the patient.       Past Medical History:  Diagnosis Date  . Anemia   . Anxiety   . Arthritis   . Breast cancer of upper-outer quadrant of left female breast ([place]) 08/18/2015  . Broken wrist    when younger x2  . COPD (chronic obstructive pulmonary disease) [facility])    Hospitalization March 2011  . Dementia ([place])   . Depression   . Diabetes mellitus without complication ([place])    type 2  . Dyspnea   . Full dentures   . H/O vaginal delivery 1962   x1   . High cholesterol   . History of home oxygen therapy   . Hypertension   . Memory changes    sharp decline over the last 3 months, per son as of 01/22/21,   . Pneumonia   . Radiation    11/18/15- 12/16/15 to her Left Breast    Patient Active Problem List   Diagnosis Date Noted  . Primary lung cancer, right ([place]) 02/02/2021  . Mass of middle lobe of right lung 01/29/2021  . Adult failure to thrive 01/14/2021  . Acute  kidney injury ([place]) 01/10/2021  . Hypoxic 01/01/2021  . Shortness of breath 01/01/2021  . Mild mitral regurgitation 01/01/2021  . Pneumonia   . Hypertension   . History of home oxygen therapy   . High cholesterol   . Full dentures   . Diabetes mellitus without complication ([place])   . Depression   . Broken wrist   . Anxiety   . Generalized weakness 12/12/2020  . Cigar smoker 12/12/2020  . Aortic atherosclerosis ([place]) 12/12/2020  . Hypertension associated with diabetes ([place]) 12/11/2020  . Hyperlipidemia associated with type 2 diabetes mellitus ([place]) 12/11/2020  . Hypokalemia 12/11/2020  . Symptomatic anemia 12/04/2020  . Type 2 diabetes mellitus ([place]) 12/04/2020  . COPD (chronic obstructive pulmonary disease) ([place]) 12/04/2020  . Mass of right lung 12/04/2020  . Tobacco abuse 09/30/2015  . Breast cancer of upper-outer quadrant of left female breast ([place]) 08/18/2015  . Essential hypertension, benign 05/22/2014  . Mixed hyperlipidemia 05/22/2014    Past Surgical History:  Procedure Laterality Date  . ABDOMINAL HYSTERECTOMY    . APPENDECTOMY    . CHOLECYSTECTOMY    . EYE SURGERY     cataracts  . RADIOACTIVE SEED GUIDED PARTIAL MASTECTOMY WITH AXILLARY  SENTINEL LYMPH NODE BIOPSY Left 09/17/2015   Procedure: RADIOACTIVE SEED LOCALIZATION LEFT BREAST LUMPECTOMY AND LEFT AXILLARY SENTINEL LYMPH NODE BIOPSY;  Surgeon: Excell Seltzer, MD;  Location: [facility];  Service: General;  Laterality: Left;  . TONSILLECTOMY       OB History   No obstetric history on file.     Family History  Problem Relation Age of Onset  . Colon cancer Sister   . Lung cancer Sister     Social History   Tobacco Use  . Smoking status: Former Smoker    Packs/day: 1.00    Years: 70.00    Pack years: 70.00    Types: Cigarettes  . Smokeless tobacco: Never Used  . Tobacco comment: quit 5 weeks ago [facility] 02/11/21  Vaping Use  . Vaping Use: Never used  Substance Use Topics  . Alcohol use: Not Currently     Comment: rare  . Drug use: No    Home Medications Prior to Admission medications   Medication Sig Start Date End Date Taking? Authorizing Provider  albuterol (VENTOLIN HFA) 108 (90 Base) MCG/ACT inhaler Inhale 2 puffs into the lungs every 2 (two) hours as needed for wheezing or shortness of breath. Please instruct in usage 02/07/21   Medina-Vargas, Monina C, NP  anastrozole (ARIMIDEX) 1 MG tablet Take 1 tablet (1 mg total) by mouth daily. 02/07/21   Medina-Vargas, Monina C, NP  aspirin EC 81 MG tablet Take 81 mg by mouth daily.    [provider]  Cholecalciferol (VITAMIN D3) 125 MCG (5000 UT) CAPS Take 5,000 Units by mouth daily.    [provider]  ferrous sulfate 325 (65 FE) MG tablet Take 325 mg by mouth 2 (two) times daily.    [provider]  metFORMIN (GLUCOPHAGE) 500 MG tablet Take 1 tablet (500 mg total) by mouth daily. 02/07/21   Medina-Vargas, Monina C, NP  nicotine (NICODERM CQ - DOSED IN MG/24 HOURS) 14 mg/24hr patch Place 1 patch (14 mg total) onto the skin daily. Patient not taking: No sig reported 02/07/21   Medina-Vargas, Monina C, NP  nitroGLYCERIN (NITROSTAT) 0.4 MG SL tablet Place 0.4 mg under the tongue every 15 (fifteen) minutes as needed for chest pain.    [provider]  pantoprazole (PROTONIX) 40 MG tablet Take 1 tablet (40 mg total) by mouth daily before breakfast. 02/07/21 03/09/21  Medina-Vargas, Monina C, NP  pentoxifylline (TRENTAL) 400 MG CR tablet Take 1 tablet (400 mg total) by mouth 3 (three) times daily with meals. 02/07/21   Medina-Vargas, Monina C, NP  pioglitazone (ACTOS) 45 MG tablet Take 1 tablet (45 mg total) by mouth daily. 02/07/21   Medina-Vargas, Monina C, NP  quinapril (ACCUPRIL) 40 MG tablet Take 40 mg by mouth daily.    [provider]  rosuvastatin (CRESTOR) 40 MG tablet Take 1 tablet (40 mg total) by mouth daily. 02/07/21   Medina-Vargas, Monina C, NP  Tiotropium Bromide-Olodaterol (STIOLTO RESPIMAT) 2.5-2.5  MCG/ACT AERS Inhale 2 puffs into the lungs daily. Patient not taking: No sig reported 02/07/21   Medina-Vargas, Monina C, NP  Tiotropium Bromide-Olodaterol (STIOLTO RESPIMAT) 2.5-2.5 MCG/ACT AERS Inhale 2 puffs into the lungs daily. 02/11/21   Collene Gobble, MD  verapamil (CALAN-SR) 240 MG CR tablet Take 240 mg by mouth daily. 06/10/15   [provider]    Allergies    Ferumoxytol, Pneumococcal vaccines, and Repaglinide  Review of Systems   Review of Systems  Constitutional: Negative for fever.  Respiratory:  Positive for shortness of breath. Negative for cough.   Cardiovascular: Positive for chest pain.  Gastrointestinal: Negative for abdominal pain, nausea and vomiting.  Genitourinary: Negative for dysuria and hematuria.  Neurological: Positive for weakness (generalized). Negative for headaches.  All other systems reviewed and are negative.   Physical Exam Updated Vital Signs BP (!) 109/55   Pulse 76   Temp 97.9 F (36.6 C) (Oral)   Resp (!) 23   SpO2 97%   Physical Exam Vitals and nursing note reviewed.  Constitutional:      Appearance: Normal appearance. She is well-developed.  HENT:     Head: Normocephalic and atraumatic.  Eyes:     General: Lids are normal.     Conjunctiva/sclera: Conjunctivae normal.     Pupils: Pupils are equal, round, and reactive to light.     Comments: Pale conjunctive a bilaterally.  Cardiovascular:     Rate and Rhythm: Normal rate and regular rhythm.     Pulses: Normal pulses.     Heart sounds: Normal heart sounds. No murmur heard. No friction rub. No gallop.   Pulmonary:     Effort: Pulmonary effort is normal.     Breath sounds: Normal breath sounds.     Comments: Intermittent coughing. Able to speak in full sentences. No evidence of respiratory distress.  Abdominal:     Palpations: Abdomen is soft. Abdomen is not rigid.     Tenderness: There is no abdominal tenderness. There is no guarding.  Genitourinary:    Comments: The  exam was performed with a chaperone present (Kami, RN). Digital Rectal Exam reveals sphincter with good tone. No external hemorrhoids. No masses or fissures. Stool color is brown with no overt blood. Musculoskeletal:        General: Normal range of motion.     Cervical back: Full passive range of motion without pain.  Skin:    General: Skin is warm and dry.     Capillary Refill: Capillary refill takes less than 2 seconds.  Neurological:     Mental Status: She is alert and oriented to person, place, and time.  Psychiatric:        Speech: Speech normal.     ED Results / Procedures / Treatments   Labs (all labs ordered are listed, but only abnormal results are displayed) Labs Reviewed  CBC WITH DIFFERENTIAL/PLATELET - Abnormal; Notable for the following components:      Result Value   RBC 2.53 (*)    Hemoglobin 6.8 (*)    HCT 23.3 (*)    MCHC 29.2 (*)    RDW 22.2 (*)    All other components within normal limits  COMPREHENSIVE METABOLIC PANEL - Abnormal; Notable for the following components:   Sodium 129 (*)    Chloride 93 (*)    Glucose, Bld 207 (*)    Creatinine, Ser 1.08 (*)    Calcium 8.8 (*)    Total Protein 5.1 (*)    Albumin 2.0 (*)    GFR, Estimated 53 (*)    All other components within normal limits  CULTURE, BLOOD (ROUTINE X 2)  CULTURE, BLOOD (ROUTINE X 2)  URINE CULTURE  RESP PANEL BY RT-PCR (FLU A&B, COVID) ARPGX2  LIPASE, BLOOD  URINALYSIS, ROUTINE W REFLEX MICROSCOPIC  POC OCCULT BLOOD, ED  TYPE AND SCREEN  PREPARE RBC (CROSSMATCH)  TROPONIN I (HIGH SENSITIVITY)    EKG EKG Interpretation  Date/Time:  Thursday February 12 2021 18:58:22 EDT Ventricular Rate:  86 PR Interval:  188 QRS Duration: 72 QT Interval:  350 QTC Calculation: 418 R Axis:   61 Text Interpretation: Normal sinus rhythm Low voltage QRS Nonspecific ST abnormality Abnormal ECG No significant change since last tracing Confirmed by Isla Pence [phone]) on 02/12/2021 9:06:25  PM   Radiology DG Chest 2 View  Result Date: 02/12/2021 CLINICAL DATA:  Generalized weakness, chest and epigastric pain, anemia, lung cancer EXAM: CHEST - 2 VIEW COMPARISON:  01/16/2021 FINDINGS: Frontal and lateral views of the chest demonstrate an unremarkable cardiac silhouette. There is persistent consolidation within the right middle lobe compatible with known lung cancer. Small bilateral pleural effusions, right greater than left. The left chest is clear. No pneumothorax. IMPRESSION: 1. Persistent right middle lobe mass consistent with known lung cancer. 2. Small bilateral pleural effusions, right greater than left. Electronically Signed   By: Randa Ngo M.D.   On: 02/12/2021 23:34    Procedures .Critical Care Performed by: Volanda Napoleon, PA-C Authorized by: Volanda Napoleon, PA-C   Critical care provider statement:    Critical care time (minutes):  35   Critical care was time spent personally by me on the following activities:  Discussions with consultants, evaluation of patient's response to treatment, examination of patient, ordering and performing treatments and interventions, ordering and review of laboratory studies, ordering and review of radiographic studies, pulse oximetry, re-evaluation of patient's condition, obtaining history from patient or surrogate and review of old charts   I assumed direction of critical care for this patient from another provider in my specialty: yes     Care discussed with: admitting provider       Medications Ordered in ED Medications  0.9 %  sodium chloride infusion (has no administration in time range)    ED Course  I have reviewed the triage vital signs and the nursing notes.  Pertinent labs & imaging results that were available during my care of the patient were reviewed by me and considered in my medical decision making (see chart for details).    MDM Rules/Calculators/A&P                          80 year old female who  presents for evaluation of generalized weakness.  She also has had some intermittent shortness of breath, chest clear of when that exactly started.  Went to see her primary care doctor today and was noted to be hypertensive.  They did blood work and showed her hemoglobin was 7.  She had a recent transfusion.  Hemoglobin had improved to about 9 after transfusion.  Patient does report she feels weak and tired.  On initially arrival, she is afebrile, nontoxic-appearing.  She initially had some soft blood pressures at triage.  Blood pressure improved to 116/60.  She does not have any bleeding from rectal exam.  Labs ordered at triage.  Fecal occult negative.  CBC shows hemoglobin of 6.8.  CMP shows sodium 129.  BUN is 19, creatinine 1.08. Lipase normal.  Troponin normal.  Chest x-ray shows persistent lung mass.  Small pleural effusion.  Patient's blood pressure have remained stable here in ED.  Last one was 109/55.  Given history of hypotension, symptomatic anemia, will plan for admission.  Discussed with Dr. Nevada Crane (hospitalist) who accepts patient for admission.  Portions of this note were generated with [employer]. Dictation errors may occur despite best attempts at proofreading.   Final Clinical Impression(s) / ED Diagnoses Final diagnoses:  Symptomatic anemia  Rx / DC Orders ED Discharge Orders    None       Desma Mcgregor 02/13/21 0006    Isla Pence, MD 02/16/21 1615

## 2021-02-13 ENCOUNTER — Telehealth: Payer: Self-pay | Admitting: Emergency Medicine

## 2021-02-13 ENCOUNTER — Observation Stay (HOSPITAL_COMMUNITY): Payer: Medicare Other

## 2021-02-13 DIAGNOSIS — F32A Depression, unspecified: Secondary | ICD-10-CM | POA: Diagnosis present

## 2021-02-13 DIAGNOSIS — M4854XA Collapsed vertebra, not elsewhere classified, thoracic region, initial encounter for fracture: Secondary | ICD-10-CM | POA: Diagnosis present

## 2021-02-13 DIAGNOSIS — Z7189 Other specified counseling: Secondary | ICD-10-CM

## 2021-02-13 DIAGNOSIS — Z853 Personal history of malignant neoplasm of breast: Secondary | ICD-10-CM | POA: Diagnosis not present

## 2021-02-13 DIAGNOSIS — I959 Hypotension, unspecified: Secondary | ICD-10-CM | POA: Diagnosis present

## 2021-02-13 DIAGNOSIS — D649 Anemia, unspecified: Secondary | ICD-10-CM | POA: Diagnosis present

## 2021-02-13 DIAGNOSIS — J9811 Atelectasis: Secondary | ICD-10-CM | POA: Diagnosis not present

## 2021-02-13 DIAGNOSIS — Z20822 Contact with and (suspected) exposure to covid-19: Secondary | ICD-10-CM | POA: Diagnosis present

## 2021-02-13 DIAGNOSIS — E861 Hypovolemia: Secondary | ICD-10-CM | POA: Diagnosis present

## 2021-02-13 DIAGNOSIS — E7800 Pure hypercholesterolemia, unspecified: Secondary | ICD-10-CM | POA: Diagnosis present

## 2021-02-13 DIAGNOSIS — E871 Hypo-osmolality and hyponatremia: Secondary | ICD-10-CM

## 2021-02-13 DIAGNOSIS — I129 Hypertensive chronic kidney disease with stage 1 through stage 4 chronic kidney disease, or unspecified chronic kidney disease: Secondary | ICD-10-CM | POA: Diagnosis present

## 2021-02-13 DIAGNOSIS — E1165 Type 2 diabetes mellitus with hyperglycemia: Secondary | ICD-10-CM | POA: Diagnosis present

## 2021-02-13 DIAGNOSIS — R918 Other nonspecific abnormal finding of lung field: Secondary | ICD-10-CM | POA: Diagnosis present

## 2021-02-13 DIAGNOSIS — E1122 Type 2 diabetes mellitus with diabetic chronic kidney disease: Secondary | ICD-10-CM | POA: Diagnosis present

## 2021-02-13 DIAGNOSIS — F419 Anxiety disorder, unspecified: Secondary | ICD-10-CM | POA: Diagnosis present

## 2021-02-13 DIAGNOSIS — J9601 Acute respiratory failure with hypoxia: Secondary | ICD-10-CM | POA: Diagnosis present

## 2021-02-13 DIAGNOSIS — E785 Hyperlipidemia, unspecified: Secondary | ICD-10-CM | POA: Diagnosis present

## 2021-02-13 DIAGNOSIS — J90 Pleural effusion, not elsewhere classified: Secondary | ICD-10-CM | POA: Diagnosis present

## 2021-02-13 DIAGNOSIS — F0390 Unspecified dementia without behavioral disturbance: Secondary | ICD-10-CM | POA: Diagnosis present

## 2021-02-13 DIAGNOSIS — Z8616 Personal history of COVID-19: Secondary | ICD-10-CM | POA: Diagnosis not present

## 2021-02-13 DIAGNOSIS — Z515 Encounter for palliative care: Secondary | ICD-10-CM | POA: Diagnosis not present

## 2021-02-13 DIAGNOSIS — S22080A Wedge compression fracture of T11-T12 vertebra, initial encounter for closed fracture: Secondary | ICD-10-CM | POA: Diagnosis not present

## 2021-02-13 DIAGNOSIS — J441 Chronic obstructive pulmonary disease with (acute) exacerbation: Secondary | ICD-10-CM | POA: Diagnosis present

## 2021-02-13 DIAGNOSIS — N1831 Chronic kidney disease, stage 3a: Secondary | ICD-10-CM | POA: Diagnosis present

## 2021-02-13 DIAGNOSIS — D638 Anemia in other chronic diseases classified elsewhere: Secondary | ICD-10-CM | POA: Diagnosis present

## 2021-02-13 DIAGNOSIS — R5381 Other malaise: Secondary | ICD-10-CM

## 2021-02-13 DIAGNOSIS — I700 Atherosclerosis of aorta: Secondary | ICD-10-CM | POA: Diagnosis present

## 2021-02-13 DIAGNOSIS — Z9981 Dependence on supplemental oxygen: Secondary | ICD-10-CM | POA: Diagnosis not present

## 2021-02-13 LAB — CBC
HCT: 35 % — ABNORMAL LOW (ref 36.0–46.0)
Hemoglobin: 11 g/dL — ABNORMAL LOW (ref 12.0–15.0)
MCH: 28.7 pg (ref 26.0–34.0)
MCHC: 31.4 g/dL (ref 30.0–36.0)
MCV: 91.4 fL (ref 80.0–100.0)
Platelets: 352 10*3/uL (ref 150–400)
RBC: 3.83 MIL/uL — ABNORMAL LOW (ref 3.87–5.11)
RDW: 18 % — ABNORMAL HIGH (ref 11.5–15.5)
WBC: 7.3 10*3/uL (ref 4.0–10.5)
nRBC: 0 % (ref 0.0–0.2)

## 2021-02-13 LAB — RENAL FUNCTION PANEL
Albumin: 2.1 g/dL — ABNORMAL LOW (ref 3.5–5.0)
Anion gap: 11 (ref 5–15)
BUN: 13 mg/dL (ref 8–23)
CO2: 24 mmol/L (ref 22–32)
Calcium: 8.7 mg/dL — ABNORMAL LOW (ref 8.9–10.3)
Chloride: 92 mmol/L — ABNORMAL LOW (ref 98–111)
Creatinine, Ser: 1.02 mg/dL — ABNORMAL HIGH (ref 0.44–1.00)
GFR, Estimated: 56 mL/min — ABNORMAL LOW (ref >60)
Glucose, Bld: 128 mg/dL — ABNORMAL HIGH (ref 70–99)
Phosphorus: 3.2 mg/dL (ref 2.5–4.6)
Potassium: 3.5 mmol/L (ref 3.5–5.1)
Sodium: 127 mmol/L — ABNORMAL LOW (ref 135–145)

## 2021-02-13 LAB — URINALYSIS, ROUTINE W REFLEX MICROSCOPIC
Bacteria, UA: NONE SEEN
Bilirubin Urine: NEGATIVE
Glucose, UA: 50 mg/dL — AB
Hgb urine dipstick: NEGATIVE
Ketones, ur: NEGATIVE mg/dL
Leukocytes,Ua: NEGATIVE
Nitrite: NEGATIVE
Protein, ur: 30 mg/dL — AB
Specific Gravity, Urine: 1.015 (ref 1.005–1.030)
pH: 5 (ref 5.0–8.0)

## 2021-02-13 LAB — BASIC METABOLIC PANEL
Anion gap: 7 (ref 5–15)
BUN: 13 mg/dL (ref 8–23)
CO2: 32 mmol/L (ref 22–32)
Calcium: 9.3 mg/dL (ref 8.9–10.3)
Chloride: 95 mmol/L — ABNORMAL LOW (ref 98–111)
Creatinine, Ser: 0.94 mg/dL (ref 0.44–1.00)
GFR, Estimated: >60 mL/min (ref >60)
Glucose, Bld: 179 mg/dL — ABNORMAL HIGH (ref 70–99)
Potassium: 3.8 mmol/L (ref 3.5–5.1)
Sodium: 134 mmol/L — ABNORMAL LOW (ref 135–145)

## 2021-02-13 LAB — GLUCOSE, CAPILLARY
Glucose-Capillary: 152 mg/dL — ABNORMAL HIGH (ref 70–99)
Glucose-Capillary: 142 mg/dL — ABNORMAL HIGH (ref 70–99)
Glucose-Capillary: 187 mg/dL — ABNORMAL HIGH (ref 70–99)

## 2021-02-13 LAB — CBG MONITORING, ED
Glucose-Capillary: 159 mg/dL — ABNORMAL HIGH (ref 70–99)
Glucose-Capillary: 183 mg/dL — ABNORMAL HIGH (ref 70–99)

## 2021-02-13 LAB — D-DIMER, QUANTITATIVE: D-Dimer, Quant: 1.15 ug/mL-FEU — ABNORMAL HIGH (ref 0.00–0.50)

## 2021-02-13 LAB — RESP PANEL BY RT-PCR (FLU A&B, COVID) ARPGX2
Influenza A by PCR: NEGATIVE
Influenza B by PCR: NEGATIVE
SARS Coronavirus 2 by RT PCR: POSITIVE — AB

## 2021-02-13 LAB — MAGNESIUM: Magnesium: 1.5 mg/dL — ABNORMAL LOW (ref 1.7–2.4)

## 2021-02-13 LAB — BRAIN NATRIURETIC PEPTIDE: B Natriuretic Peptide: 70.3 pg/mL (ref 0.0–100.0)

## 2021-02-13 LAB — PHOSPHORUS: Phosphorus: 3.3 mg/dL (ref 2.5–4.6)

## 2021-02-13 MED ORDER — ANASTROZOLE 1 MG PO TABS
1.0000 mg | ORAL_TABLET | Freq: Every day | ORAL | Status: DC
  Administered 2021-02-13 – 2021-02-14 (×2): 1 mg via ORAL
  Filled 2021-02-13 (×3): qty 1

## 2021-02-13 MED ORDER — BUDESONIDE 0.5 MG/2ML IN SUSP
0.5000 mg | Freq: Two times a day (BID) | RESPIRATORY_TRACT | Status: DC
  Administered 2021-02-14: 0.5 mg via RESPIRATORY_TRACT
  Filled 2021-02-13 (×2): qty 2

## 2021-02-13 MED ORDER — VITAMIN D 25 MCG (1000 UNIT) PO TABS
5000.0000 [IU] | ORAL_TABLET | Freq: Every day | ORAL | Status: DC
  Administered 2021-02-13 – 2021-02-14 (×2): 5000 [IU] via ORAL
  Filled 2021-02-13 (×4): qty 5

## 2021-02-13 MED ORDER — ACETAMINOPHEN 325 MG PO TABS
650.0000 mg | ORAL_TABLET | Freq: Four times a day (QID) | ORAL | Status: DC | PRN
  Administered 2021-02-13 – 2021-02-14 (×4): 650 mg via ORAL
  Filled 2021-02-13 (×4): qty 2

## 2021-02-13 MED ORDER — FERROUS SULFATE 325 (65 FE) MG PO TABS
325.0000 mg | ORAL_TABLET | Freq: Two times a day (BID) | ORAL | Status: DC
  Administered 2021-02-13 – 2021-02-14 (×3): 325 mg via ORAL
  Filled 2021-02-13 (×3): qty 1

## 2021-02-13 MED ORDER — VERAPAMIL HCL ER 240 MG PO TBCR
240.0000 mg | EXTENDED_RELEASE_TABLET | Freq: Every day | ORAL | Status: DC
  Filled 2021-02-13: qty 1

## 2021-02-13 MED ORDER — LACTATED RINGERS IV SOLN: INTRAVENOUS | Status: DC

## 2021-02-13 MED ORDER — IOHEXOL 350 MG/ML SOLN
50.0000 mL | Freq: Once | INTRAVENOUS | Status: AC | PRN
  Administered 2021-02-13: 50 mL via INTRAVENOUS

## 2021-02-13 MED ORDER — ASPIRIN EC 81 MG PO TBEC
81.0000 mg | DELAYED_RELEASE_TABLET | Freq: Every day | ORAL | Status: DC
  Administered 2021-02-13 – 2021-02-14 (×2): 81 mg via ORAL
  Filled 2021-02-13 (×2): qty 1

## 2021-02-13 MED ORDER — ALBUTEROL SULFATE (2.5 MG/3ML) 0.083% IN NEBU: 2.5000 mg | INHALATION_SOLUTION | RESPIRATORY_TRACT | Status: DC | PRN

## 2021-02-13 MED ORDER — ROSUVASTATIN CALCIUM 20 MG PO TABS
40.0000 mg | ORAL_TABLET | Freq: Every day | ORAL | Status: DC
  Administered 2021-02-13 – 2021-02-14 (×2): 40 mg via ORAL
  Filled 2021-02-13 (×2): qty 2

## 2021-02-13 MED ORDER — MAGNESIUM SULFATE 2 GM/50ML IV SOLN
2.0000 g | Freq: Once | INTRAVENOUS | Status: AC
  Administered 2021-02-13: 2 g via INTRAVENOUS
  Filled 2021-02-13: qty 50

## 2021-02-13 MED ORDER — INSULIN ASPART 100 UNIT/ML IJ SOLN
0.0000 [IU] | Freq: Three times a day (TID) | INTRAMUSCULAR | Status: DC
  Administered 2021-02-13 (×2): 2 [IU] via SUBCUTANEOUS
  Administered 2021-02-13: 1 [IU] via SUBCUTANEOUS
  Administered 2021-02-14 (×2): 2 [IU] via SUBCUTANEOUS

## 2021-02-13 MED ORDER — INSULIN ASPART 100 UNIT/ML IJ SOLN: 0.0000 [IU] | Freq: Every day | INTRAMUSCULAR | Status: DC

## 2021-02-13 MED ORDER — PANTOPRAZOLE SODIUM 40 MG PO TBEC
40.0000 mg | DELAYED_RELEASE_TABLET | Freq: Every day | ORAL | Status: DC
  Administered 2021-02-13 – 2021-02-14 (×2): 40 mg via ORAL
  Filled 2021-02-13 (×2): qty 1

## 2021-02-13 MED ORDER — UMECLIDINIUM-VILANTEROL 62.5-25 MCG/INH IN AEPB
1.0000 | INHALATION_SPRAY | Freq: Every day | RESPIRATORY_TRACT | Status: DC
  Administered 2021-02-13 – 2021-02-14 (×2): 1 via RESPIRATORY_TRACT
  Filled 2021-02-13: qty 14

## 2021-02-13 MED ORDER — IPRATROPIUM-ALBUTEROL 0.5-2.5 (3) MG/3ML IN SOLN: 3.0000 mL | RESPIRATORY_TRACT | Status: DC | PRN

## 2021-02-13 NOTE — Evaluation (Addendum)
Physical Therapy Evaluation Patient Details Name: Dawn [name] MRN: [id number] DOB: [birth year] Today's Date: 02/13/2021   History of Present Illness  80 y.o. presented to [facility] ED apparently from home (recent rehab at [facility] - discharged 5/27 from SNF to home) due to symptomatic anemia and hypotension. PMH: left breast cancer diagnosed in December 2016 status post lumpectomy followed by adjuvant radiotherapy as well as hormonal therapy, large right middle lobe lung mass with subcarinal lymphadenopathy pending tissue biopsy, anemia of chronic disease, COPD, Covid 5/22; Anxiety; Arthritis; Dementia; DM; Depression; recent hospital admission 5/6 with DC to  SNF  Clinical Impression  Pt admitted secondary to problem above with deficits below. Poor cognition noted throughout, especially with short term memory. Pt requiring min A for transfers from elevated height, but required mod A +2 from lower toilet height. Min A for steadying during gait. Per notes, d/c'd from SNF recently on 5/27? In order to DC home, pt would need 24/7 S. If 24/7 S not available, recommend SNF for rehab then further assessment for placement at ALF. Will follow acutely.     Follow Up Recommendations SNF;Supervision/Assistance - 24 hour    Equipment Recommendations  Rolling walker with 5" wheels    Recommendations for Other Services       Precautions / Restrictions Precautions Precautions: Fall Precaution Comments: watch O2 Sats Restrictions Weight Bearing Restrictions: No      Mobility  Bed Mobility Overal bed mobility: Needs Assistance Bed Mobility: Supine to Sit;Sit to Supine     Supine to sit: Min assist Sit to supine: Min assist   General bed mobility comments: Min A for trunk assist to come to sitting. Min A for LE assist to return to supine.    Transfers Overall transfer level: Needs assistance Equipment used: 2 person hand held assist Transfers: Sit to/from Stand Sit to Stand: Mod assist;+2  physical assistance (from low toilet; Min A from higher surface)         General transfer comment: Required mod A +2 for lift assist to stand from toilet. Otherwise requiring min A for steadying to stand from higher surface.  Ambulation/Gait Ambulation/Gait assistance: Min assist Gait Distance (Feet): 50 Feet Assistive device: Rolling walker (2 wheeled);2 person hand held assist Gait Pattern/deviations: Step-through pattern;Decreased stride length Gait velocity: Decreased   General Gait Details: Unsteady gait requiring min A +2 for steadying and safety to ambulate. Improved steadiness with RW, but pt required safety cues on how to use.  Stairs            Wheelchair Mobility    Modified Rankin (Stroke Patients Only)       Balance Overall balance assessment: Needs assistance Sitting-balance support: Feet supported Sitting balance-Leahy Scale: Fair     Standing balance support: Single extremity supported;Bilateral upper extremity supported Standing balance-Leahy Scale: Poor Standing balance comment: Reliant on UE and external support                             Pertinent Vitals/Pain Pain Assessment: 0-10 Pain Score: 6  Pain Location: back Pain Descriptors / Indicators: Discomfort;Grimacing;Aching;Moaning Pain Intervention(s): Limited activity within patient's tolerance;Monitored during session;Repositioned    Home Living Family/patient expects to be discharged to:: Private residence Living Arrangements: Alone Available Help at Discharge: Available PRN/intermittently Type of Home: Other(Comment) (condo) Home Access: Stairs to enter Entrance Stairs-Rails: Right;Left Entrance Stairs-Number of Steps: 2 Home Layout: One level Home Equipment: Civil engineer, contracting  Prior Function Level of Independence: Needs assistance   Gait / Transfers Assistance Needed: Reports she has been independent  ADL's / Homemaking Assistance Needed: poor historian - reports she  is independent however previous notes say she needs assistance        Hand Dominance   Dominant Hand: Left    Extremity/Trunk Assessment   Upper Extremity Assessment Upper Extremity Assessment: Defer to OT evaluation    Lower Extremity Assessment Lower Extremity Assessment: Generalized weakness    Cervical / Trunk Assessment Cervical / Trunk Assessment: Normal  Communication   Communication: HOH  Cognition Arousal/Alertness: Awake/alert Behavior During Therapy: Anxious Overall Cognitive Status: No family/caregiver present to determine baseline cognitive functioning Area of Impairment: Orientation;Attention;Memory;Following commands;Safety/judgement;Awareness;Problem solving                 Orientation Level: Disoriented to;Place;Time;Situation Current Attention Level: Selective Memory: Decreased short-term memory Following Commands: Follows one step commands with increased time Safety/Judgement: Decreased awareness of safety;Decreased awareness of deficits Awareness: Intellectual Problem Solving: Slow processing General Comments: Very poor STM      General Comments General comments (skin integrity, edema, etc.): Easily fatigues; Increased SOB wtih activity    Exercises     Assessment/Plan    PT Assessment Patient needs continued PT services  PT Problem List Decreased strength;Decreased balance;Decreased activity tolerance;Decreased mobility;Decreased cognition;Decreased knowledge of use of DME;Decreased safety awareness;Decreased knowledge of precautions       PT Treatment Interventions DME instruction;Gait training;Stair training;Therapeutic activities;Functional mobility training;Therapeutic exercise;Neuromuscular re-education;Balance training;Patient/family education;Cognitive remediation    PT Goals (Current goals can be found in the Care Plan section)  Acute Rehab PT Goals Patient Stated Goal: to go to the bathroom PT Goal Formulation: With  patient Time For Goal Achievement: 02/27/21 Potential to Achieve Goals: Fair    Frequency Min 2X/week   Barriers to discharge        Co-evaluation PT/OT/SLP Co-Evaluation/Treatment: Yes Reason for Co-Treatment: For patient/therapist safety;To address functional/ADL transfers PT goals addressed during session: Mobility/safety with mobility;Balance OT goals addressed during session: ADL's and self-care       AM-PAC PT "6 Clicks" Mobility  Outcome Measure Help needed turning from your back to your side while in a flat bed without using bedrails?: A Little Help needed moving from lying on your back to sitting on the side of a flat bed without using bedrails?: A Little Help needed moving to and from a bed to a chair (including a wheelchair)?: A Little Help needed standing up from a chair using your arms (e.g., wheelchair or bedside chair)?: A Lot Help needed to walk in hospital room?: A Little Help needed climbing 3-5 steps with a railing? : A Lot 6 Click Score: 16    End of Session   Activity Tolerance: Patient limited by fatigue Patient left: in bed;with call bell/phone within reach (on stretcher in ED) Nurse Communication: Mobility status PT Visit Diagnosis: Unsteadiness on feet (R26.81);Muscle weakness (generalized) (M62.81)    Time: 4174-0814 PT Time Calculation (min) (ACUTE ONLY): 29 min   Charges:   PT Evaluation $PT Eval Moderate Complexity: 1 Mod          Reuel Derby, PT, DPT  Acute Rehabilitation Services  Pager: [phone] Office: [phone]   Rudean Hitt 02/13/2021, 11:11 AM

## 2021-02-13 NOTE — Progress Notes (Signed)
PROGRESS NOTE  Dawn [name] PIR:[id number] DOB: 1941/08/18   PCP: Lawerance Cruel, MD  Patient is from: Home.  Lives alone.  Independently ambulates at baseline.  DOA: 02/12/2021 LOS: 0  Chief complaints:  Chief Complaint  Patient presents with  . Abnormal Lab  . Weakness     Brief Narrative / Interim history: 80 year old female with history of COPD, left breast cancer s/p lumpectomy, radiotherapy and hormonal therapy, cognitive deficits, RML mass with subcarinal LAD pending tissue biopsy anxiety, recent COVID-19 infection, tobacco use disorder, CKD-3A and anemia of chronic disease presented to PCP for routine follow and directed to ED due to hypotension and anemia with hemoglobin down to 7.0.  Repeat Hgb in ED 6.8.  She was transfused 2 units with appropriate response.  Per patient's son, patient has chronic dyspnea and progressive generalized weakness.   Patient was to be discharged from ED but desaturated to 88% with increased shortness of breath and chest pain during therapy evaluation.  Therapy recommended SNF. Patient Further work-up reveals normal BNP but elevated D-dimer. CTA chest ordered.  Started on breathing treatments.   Subjective: Seen and examined earlier this morning.  No major events overnight of this morning.  Complains mid back pain that she attributes to lying in bed.  She reports "some" shortness of breath.  She also reports pain over RUQ but not able to characterize.  She denies nausea or vomiting.  She is awake and alert but only oriented to self.  She thinks she is in "some sort of facility".  She does not know she is in the hospital.  She has no insight to why she is in the hospital either.  Objective: Vitals:   02/13/21 0855 02/13/21 0900 02/13/21 1015 02/13/21 1107  BP:  (!) 169/80 (!) 151/68 (!) 158/76  Pulse:  73 74 75  Resp:  15 17 16   Temp: 97.8 F (36.6 C)  98 F (36.7 C) 97.9 F (36.6 C)  TempSrc: Oral  Oral Oral  SpO2:  90% 94% 91%     Intake/Output Summary (Last 24 hours) at 02/13/2021 1254 Last data filed at 02/13/2021 6301 Gross per 24 hour  Intake 354 ml  Output --  Net 354 ml   There were no vitals filed for this visit.  Examination:  GENERAL: No apparent distress.  Nontoxic. HEENT: MMM.  Vision and hearing grossly intact.  NECK: Supple.  No apparent JVD.  RESP: On RA.  No IWOB.  Fair aeration bilaterally. CVS:  RRR. Heart sounds normal.  ABD/GI/GU: BS+. Abd soft.  RUQ tenderness.  No rebound.  Negative Murphy. MSK/EXT:  Moves extremities. No apparent deformity. No edema.  Tenderness across mid back SKIN: no apparent skin lesion or wound NEURO: Awake and alert.  Oriented to self.  Follows commands.  No apparent focal neuro deficit. PSYCH: Calm. Normal affect.  Procedures:  None  Microbiology summarized: [id number] reactive Blood cultures NGTD  Assessment & Plan: Acute hypoxic respiratory failure-could be due to anemia, COPD, lung mass, atelectasis, recent COVID infection or PE.  D-dimer elevated, which could be from recent COVID-19 infection but need to rule out PE. -CTA chest -Start inhalers and nebulizers -Incentive spirometry  Symptomatic anemia: Likely anemia of chronic disease.  Hemoccult negative. Recent Labs    12/12/20 0454 12/25/20 1755 01/01/21 1633 01/10/21 1551 01/11/21 0305 01/16/21 1517 01/16/21 2032 01/30/21 0000 02/12/21 1850 02/13/21 1034  HGB 11.2* 11.7* 13.2 12.5 11.4* 9.1* 9.0* 7.6* 6.8* 11.0*  -Transfused 2 units with exaggerated  response.  Recheck H&H -Check anemia panel  COPD exacerbation-has hypoxia, shortness of breath and dry cough.  Desaturated to 88% on RA.  Was dyspneic with chest pain per therapy.  Not on oxygen at home. -Start inhalers and nebulizers -Would hold off systemic steroid unless worse given diabetes.  Large RML lung mass with subcarinal LAD-concern for malignancy -Followed by Dr. Lamonte Sakai.  Rescheduled for bronchoscopy on 6/6 due to recent COVID-19  infection. -Will notify PCCM  Hyponatremia-clinical picture suggestive of hypovolemic hyponatremia but patient's son states she does drink about a gallon of water a day.  He does not think she is dehydrated.  She is also on quinapril which could contribute.  Hyponatremia improved. -Continue IV LR for now -Recheck this evening  NIDDM-2 with hyperglycemia Recent Labs  Lab 02/13/21 0147 02/13/21 0807 02/13/21 1222  GLUCAP 159* 183* 152*  -Continue current regimen -Check hemoglobin A1c  Recent AKI-thought to have CKD which seems to be not the case Recent Labs    12/11/20 1919 12/12/20 0454 12/25/20 1755 01/01/21 1633 01/10/21 1551 01/11/21 0305 01/12/21 0626 01/16/21 1517 02/12/21 1850 02/13/21 1034  BUN 13 13 12  42* 15 13 11 12 17 13   CREATININE 0.91 1.01* 1.28* 2.00* 1.51* 1.45* 1.55* 1.01* 1.08* 0.94  -Discontinue quinapril  History of left breast cancer status postlumpectomy, radiation and hormonal therapy -Continue home medications  Hypotension in patient with history of hypertension: Hypotension resolved -Start amlodipine -Discontinue home quinapril and Cardizem  Mild RUQ tenderness: LFT within normal.  Doubt cholecystitis.  Murphy sign negative. -Continue monitoring unless worse.  Dementia without behavioral disturbance: Significant progressive cognitive decline with short-term memories over the last 3 months per patient's son. -Reorientation and delirium precautions  Generalized weakness/physical debility-was at [facility] for 24 days and discharged home on 5/27 -Therapy recommending SNF but patient's son would like to take her home when clinically ready  Goals of care -Palliative care consulted on admission   There is no height or weight on file to calculate BMI.         DVT prophylaxis:  SCDs Start: 02/13/21 0002  Code Status: Full code Family Communication: Updated patient's son over the phone. Level of care: Med-Surg Status is:  Observation  The patient will require care spanning > 2 midnights and should be moved to inpatient because: Persistent severe electrolyte disturbances, Ongoing diagnostic testing needed not appropriate for outpatient work up, IV treatments appropriate due to intensity of illness or inability to take PO and Inpatient level of care appropriate due to severity of illness  Dispo:  Patient From: Home  Planned Disposition: Home  Medically stable for discharge: No      Consultants:  Pulmonology   Sch Meds:  Scheduled Meds: . anastrozole  1 mg Oral Daily  . ferrous sulfate  325 mg Oral BID  . insulin aspart  0-5 Units Subcutaneous QHS  . insulin aspart  0-9 Units Subcutaneous TID WC  . pantoprazole  40 mg Oral QAC breakfast  . rosuvastatin  40 mg Oral Daily   Continuous Infusions: . lactated ringers Stopped (02/13/21 0911)   PRN Meds:.acetaminophen, albuterol  Antimicrobials: Anti-infectives (From admission, onward)   None       I have personally reviewed the following labs and images: CBC: Recent Labs  Lab 02/12/21 1850 02/13/21 1034  WBC 7.6 7.3  NEUTROABS 5.8  --   HGB 6.8* 11.0*  HCT 23.3* 35.0*  MCV 92.1 91.4  PLT 380 352   BMP &GFR Recent Labs  Lab  02/12/21 1850 02/13/21 1034  NA 129* 134*  K 4.1 3.8  CL 93* 95*  CO2 28 32  GLUCOSE 207* 179*  BUN 17 13  CREATININE 1.08* 0.94  CALCIUM 8.8* 9.3  MG  --  1.5*  PHOS  --  3.3   Estimated Creatinine Clearance: 40.6 mL/min (by C-G formula based on SCr of 0.94 mg/dL). Liver & Pancreas: Recent Labs  Lab 02/12/21 1850  AST 15  ALT 10  ALKPHOS 65  BILITOT 0.4  PROT 5.1*  ALBUMIN 2.0*   Recent Labs  Lab 02/12/21 1850  LIPASE 27   No results for input(s): AMMONIA in the last 168 hours. Diabetic: No results for input(s): HGBA1C in the last 72 hours. Recent Labs  Lab 02/13/21 0147 02/13/21 0807 02/13/21 1222  GLUCAP 159* 183* 152*   Cardiac Enzymes: No results for input(s): CKTOTAL, CKMB,  CKMBINDEX, TROPONINI in the last 168 hours. No results for input(s): PROBNP in the last 8760 hours. Coagulation Profile: No results for input(s): INR, PROTIME in the last 168 hours. Thyroid Function Tests: No results for input(s): TSH, T4TOTAL, FREET4, T3FREE, THYROIDAB in the last 72 hours. Lipid Profile: No results for input(s): CHOL, HDL, LDLCALC, TRIG, CHOLHDL, LDLDIRECT in the last 72 hours. Anemia Panel: No results for input(s): VITAMINB12, FOLATE, FERRITIN, TIBC, IRON, RETICCTPCT in the last 72 hours. Urine analysis:    Component Value Date/Time   COLORURINE YELLOW 02/13/2021 0610   APPEARANCEUR CLOUDY (A) 02/13/2021 0610   LABSPEC 1.015 02/13/2021 0610   PHURINE 5.0 02/13/2021 0610   GLUCOSEU 50 (A) 02/13/2021 0610   HGBUR NEGATIVE 02/13/2021 0610   BILIRUBINUR NEGATIVE 02/13/2021 0610   KETONESUR NEGATIVE 02/13/2021 0610   PROTEINUR 30 (A) 02/13/2021 0610   UROBILINOGEN 0.2 11/25/2009 1109   NITRITE NEGATIVE 02/13/2021 0610   LEUKOCYTESUR NEGATIVE 02/13/2021 0610   Sepsis Labs: Invalid input(s): PROCALCITONIN, [place]  Microbiology: Recent Results (from the past 240 hour(s))  Culture, blood (routine x 2)     Status: None (Preliminary result)   Collection Time: 02/12/21  7:15 PM   Specimen: BLOOD  Result Value Ref Range Status   Specimen Description BLOOD SITE NOT SPECIFIED  Final   Special Requests   Final    BOTTLES DRAWN AEROBIC AND ANAEROBIC Blood Culture results may not be optimal due to an excessive volume of blood received in culture bottles   Culture   Final    NO GROWTH < 12 HOURS Performed at [facility], [address] [place], [place] [address]    Report Status PENDING  Incomplete  Culture, blood (routine x 2)     Status: None (Preliminary result)   Collection Time: 02/12/21  7:29 PM   Specimen: BLOOD  Result Value Ref Range Status   Specimen Description BLOOD SITE NOT SPECIFIED  Final   Special Requests   Final    BOTTLES DRAWN AEROBIC  ONLY Blood Culture results may not be optimal due to an inadequate volume of blood received in culture bottles   Culture   Final    NO GROWTH < 12 HOURS Performed at [facility], [place] [address] [place], [place] [address]    Report Status PENDING  Incomplete  Resp Panel by RT-PCR (Flu A&B, Covid) Nasopharyngeal Swab     Status: Abnormal   Collection Time: 02/12/21 10:56 PM   Specimen: Nasopharyngeal Swab; Nasopharyngeal(NP) swabs in vial transport medium  Result Value Ref Range Status   SARS Coronavirus 2 by RT PCR  POSITIVE (A) NEGATIVE Final    Comment: RESULT CALLED TO, READ BACK BY AND VERIFIED WITH: RN K MUNETT [phone] FCP (NOTE) SARS-CoV-2 target nucleic acids are DETECTED.  The SARS-CoV-2 RNA is generally detectable in upper respiratory specimens during the acute phase of infection. Positive results are indicative of the presence of the identified virus, but do not rule out bacterial infection or co-infection with other pathogens not detected by the test. Clinical correlation with patient history and other diagnostic information is necessary to determine patient infection status. The expected result is Negative.  Fact Sheet for Patients: EntrepreneurPulse.com.au  Fact Sheet for Healthcare Providers: IncredibleEmployment.be  This test is not yet approved or cleared by the [place] FDA and  has been authorized for detection and/or diagnosis of SARS-CoV-2 by FDA under an Emergency Use Authorization (EUA).  This EUA will remain in effect (meaning this test can be used)  for the duration of  the COVID-19 declaration under Section 564(b)(1) of the Act, 21 U.S.C. section 360bbb-3(b)(1), unless the authorization is terminated or revoked sooner.     Influenza A by PCR NEGATIVE NEGATIVE Final   Influenza B by PCR NEGATIVE NEGATIVE Final    Comment: (NOTE) The Xpert Xpress SARS-CoV-2/FLU/RSV plus assay is intended as an aid in  the diagnosis of influenza from Nasopharyngeal swab specimens and should not be used as a sole basis for treatment. Nasal washings and aspirates are unacceptable for Xpert Xpress SARS-CoV-2/FLU/RSV testing.  Fact Sheet for Patients: EntrepreneurPulse.com.au  Fact Sheet for Healthcare Providers: IncredibleEmployment.be  This test is not yet approved or cleared by the [place] FDA and has been authorized for detection and/or diagnosis of SARS-CoV-2 by FDA under an Emergency Use Authorization (EUA). This EUA will remain in effect (meaning this test can be used) for the duration of the COVID-19 declaration under Section 564(b)(1) of the Act, 21 U.S.C. section 360bbb-3(b)(1), unless the authorization is terminated or revoked.  Performed at [facility], [place] [address], [place], [place] [address]     Radiology Studies: DG Chest 2 View  Result Date: 02/12/2021 CLINICAL DATA:  Generalized weakness, chest and epigastric pain, anemia, lung cancer EXAM: CHEST - 2 VIEW COMPARISON:  01/16/2021 FINDINGS: Frontal and lateral views of the chest demonstrate an unremarkable cardiac silhouette. There is persistent consolidation within the right middle lobe compatible with known lung cancer. Small bilateral pleural effusions, right greater than left. The left chest is clear. No pneumothorax. IMPRESSION: 1. Persistent right middle lobe mass consistent with known lung cancer. 2. Small bilateral pleural effusions, right greater than left. Electronically Signed   By: Randa Ngo M.D.   On: 02/12/2021 23:34       Lorraina [address] [place]  If 7PM-7AM, please contact night-coverage [email or web address] 02/13/2021, 12:54 PM

## 2021-02-13 NOTE — Progress Notes (Signed)
Per ID no need for isolation as patient tested positive for Cvovid 19 on 01/22/21 and is outside her infectious window. No need to retest for up to 90 days after initial infection.

## 2021-02-13 NOTE — Progress Notes (Signed)
Called patient's son, [name], to let him know that the patient has been moved to 6N16. Patient recently discharged from [facility] after post hospitalization admission on 01/13/2021. Patient's son states that she was there for 24 days. He received a bill for the last 4 days and believes his mother may be out of coverage for SNF. He has a hospital bed and equipment at his house and has the ability to work from home if needed. He states that she has [facility] set up that provides PT and nursing care during the day. May be [facility]. He would prefer if mother can stay until Monday since she has an upcoming bronchoscopy on Monday at 830.

## 2021-02-13 NOTE — TOC Initial Note (Addendum)
Transition of Care [facility]) - Initial/Assessment Note    Patient Details  Name: Dawn [name] MRN: [id number] Date of Birth: 06-Nov-1940  Transition of Care [name]) CM/SW Contact:    Marilu Favre, RN Phone Number: 02/13/2021, 12:32 PM  Clinical Narrative:                 Patient from home with son. Recently discharged from SNF and is co pay days.   NCM spoke to son via phone Don [phone] , he can take his mom home at discharge. He can work from home and provide 24 /7 care . Patient already active with [place] [facility] and [facility]. Confirmed with Ramond Marrow with [facility]. She will need resumption of care orders.   [name] can transport patient home at discharge in private car.   Patient has bedside commode and hospital bed   Expected Discharge Plan: [place]     Patient Goals and CMS Choice   CMS Medicare.gov Compare Post Acute Care list provided to:: Patient Choice offered to / list presented to : [place]  Expected Discharge Plan and Services Expected Discharge Plan: [name]   Discharge Planning Services: CM Consult Post Acute Care Choice: [place] arrangements for the past 2 months: Single Family Home                 DME Arranged: N/A         HH Arranged: RN,PT [place] Agency: [place] ([place]) Date HH Agency Contacted: 02/13/21 Time HH Agency Contacted: 49 Representative spoke with at [place]: [place] Arrangements/Services Living arrangements for the past 2 months: [place] with:: Adult Children Patient language and need for interpreter reviewed:: Yes Do you feel safe going back to the place where you live?: Yes      Need for Family Participation in Patient Care: Yes (Comment) Care giver support system in place?: Yes (comment) Current home services: DME Criminal Activity/Legal Involvement Pertinent to Current Situation/Hospitalization: No - Comment as  needed  Activities of Daily Living      Permission Sought/Granted   Permission granted to share information with : Yes, Verbal Permission Granted  Share Information with NAME: [name] [phone]  Permission granted to share info w AGENCY: [place] granted to share info w Relationship: son     Emotional Assessment              Admission diagnosis:  Symptomatic anemia [D64.9] Patient Active Problem List   Diagnosis Date Noted  . Primary lung cancer, right ([place]) 02/02/2021  . Mass of middle lobe of right lung 01/29/2021  . Adult failure to thrive 01/14/2021  . Acute kidney injury ([place]) 01/10/2021  . Hypoxic 01/01/2021  . Shortness of breath 01/01/2021  . Mild mitral regurgitation 01/01/2021  . Pneumonia   . Hypertension   . History of home oxygen therapy   . High cholesterol   . Full dentures   . Diabetes mellitus without complication ([place])   . Depression   . Broken wrist   . Anxiety   . Generalized weakness 12/12/2020  . Cigar smoker 12/12/2020  . Aortic atherosclerosis ([place]) 12/12/2020  . Hypertension associated with diabetes ([place]) 12/11/2020  . Hyperlipidemia associated with type 2 diabetes mellitus ([place]) 12/11/2020  . Hypokalemia 12/11/2020  . Symptomatic anemia 12/04/2020  . Type 2 diabetes mellitus ([place]) 12/04/2020  . COPD (chronic  obstructive pulmonary disease) ([place]) 12/04/2020  . Mass of right lung 12/04/2020  . Tobacco abuse 09/30/2015  . Breast cancer of upper-outer quadrant of left female breast ([place]) 08/18/2015  . Essential hypertension, benign 05/22/2014  . Mixed hyperlipidemia 05/22/2014   PCP:  Lawerance Cruel, MD Pharmacy:   [facility] [address] [place] [address] Phone: [phone] Fax: [place], [place] [address] [place] AT [facility] [address] [place] [address] Phone: [phone] Fax:  [phone]  [id number] - [place], [place] - [address] [place] [address] Phone: [phone] Fax: [phone]     Social Determinants of Health (SDOH) Interventions    Readmission Risk Interventions No flowsheet data found.

## 2021-02-13 NOTE — Telephone Encounter (Signed)
Spoke with patient's son [name]. He stated that the patient is currently in the hospital due to low BP and hemoglobin levels. Her BP yesterday was 86/32. She received 2 units of blood while in the ED. Her BP recovered back to 120/86. She is still currently at the hospital. Her covid test is also still positive.   She is currently scheduled to have a bronch done on Monday. Per son, he is hoping that she can still have the procedure done as this has been cancelled and rescheduled at least 3 times. I explained to him that if she is not stable enough for the procedure, it may have to be rescheduled again. He verbalized understanding.   Text has been sent to [place] since he is off today but doing the procedure on Monday.

## 2021-02-13 NOTE — Telephone Encounter (Signed)
Spoke with RB over the phone. I explained to him the patient's situation. While on the phone, it looked like her chart was updated to for her to be admitted at [facility]. He stated that it is too early to cancel the procedure. For now, he wants to proceed forward with the procedure. He has until early Monday to cancel the case.   I called and spoke with the patient's son. He is aware that the procedure will still occur on Monday for now. He verbalized understanding.   Nothing further needed at time of call.

## 2021-02-13 NOTE — ED Notes (Signed)
Pt's O2 drops into mid 80's with ambulation

## 2021-02-13 NOTE — Progress Notes (Signed)
Occupational Therapy Evaluation Patient Details Name: Dawn [name] MRN: [id number] DOB: [birth year] Today's Date: 02/13/2021    History of Present Illness 80 y.o. presented to [facility] ED apparently from home (recent rehab at [facility] - discharged 5/27 from SNF to home) due to symptomatic anemia and hypotension. PMH: left breast cancer diagnosed in December 2016 status post lumpectomy followed by adjuvant radiotherapy as well as hormonal therapy, large right middle lobe lung mass with subcarinal lymphadenopathy pending tissue biopsy, anemia of chronic disease, COPD, Covid 5/22; Anxiety; Arthritis; Dementia; DM; Depression; recent hospital admission 5/6 with DC to  SNF   Clinical Impression   Apparently pt recently discharged from [facility] 02/06/21 after rehab  -per chart review. Pt currently requires Mod A with sit - stand from lower surfaces, ambulates with min A @ RW level and requires min A with ADL tasks due to below listed deficits. Easily fatigues with increased SOB and complaints of chest pain. SpO2 on RA difficult to read,but appeared to be @ 88 - Would need further walking O2 sats. Pt scored a 19/28 on the Short Blessed Test of Cognition, indicating significant cognitive deficits. In order to DC home, pt would need 24/7 S. If 24/7 S not available, recommend SNF for rehab then further assessment for placement at ALF. Will follow acutely.      Follow Up Recommendations  SNF    Equipment Recommendations  None recommended by OT    Recommendations for Other Services       Precautions / Restrictions Precautions Precautions: Fall Precaution Comments: watch O2 Sats      Mobility Bed Mobility Overal bed mobility: Needs Assistance Bed Mobility: Supine to Sit     Supine to sit: Min assist          Transfers Overall transfer level: Needs assistance Equipment used: 2 person hand held assist Transfers: Sit to/from Stand Sit to Stand: Mod assist;+2 physical assistance (from low toilet;  Min A from higher surface)              Balance Overall balance assessment: Needs assistance   Sitting balance-Leahy Scale: Fair       Standing balance-Leahy Scale: Poor                             ADL either performed or assessed with clinical judgement   ADL Overall ADL's : Needs assistance/impaired Eating/Feeding: Independent   Grooming: Set up;Sitting   Upper Body Bathing: Set up;Sitting   Lower Body Bathing: Minimal assistance;Sit to/from stand   Upper Body Dressing : Set up;Sitting   Lower Body Dressing: Minimal assistance;Sit to/from stand   Toilet Transfer: Grab bars;Moderate assistance;+2 for physical assistance;Ambulation (Mod A to stand from lower surface)   Toileting- Clothing Manipulation and Hygiene: Moderate assistance       Functional mobility during ADLs: Minimal assistance;+2 for safety/equipment General ADL Comments: unsteady     Vision   Vision Assessment?: Vision impaired- to be further tested in functional context Additional Comments: most likely baseline     Perception     Praxis      Pertinent Vitals/Pain Pain Assessment: 0-10 Pain Score: 6  Pain Location: back Pain Descriptors / Indicators: Discomfort;Grimacing;Aching Pain Intervention(s): Limited activity within patient's tolerance     Hand Dominance Left   Extremity/Trunk Assessment Upper Extremity Assessment Upper Extremity Assessment: Generalized weakness   Lower Extremity Assessment Lower Extremity Assessment: Defer to PT evaluation   Cervical / Trunk Assessment  Cervical / Trunk Assessment: Normal   Communication Communication Communication: HOH   Cognition Arousal/Alertness: Awake/alert Behavior During Therapy: Anxious Overall Cognitive Status: No family/caregiver present to determine baseline cognitive functioning Area of Impairment: Orientation;Attention;Memory;Following commands;Safety/judgement;Awareness;Problem solving                  Orientation Level: Disoriented to;Place;Time;Situation Current Attention Level: Selective Memory: Decreased short-term memory Following Commands: Follows one step commands with increased time Safety/Judgement: Decreased awareness of safety;Decreased awareness of deficits Awareness: Intellectual Problem Solving: Slow processing General Comments: Scored a 19/28 on the Short Blessed Test indicating significant cognitive impairment   General Comments  Easily fatigues; Increased SOB wtih activity    Exercises     Shoulder Instructions      Home Living Family/patient expects to be discharged to:: Private residence Living Arrangements: Alone Available Help at Discharge: Available PRN/intermittently Type of Home: Other(Comment) ([place]) Home Access: Stairs to enter Entrance Stairs-Number of Steps: 2 Entrance Stairs-Rails: Right;Left Home Layout: One level     Bathroom Shower/Tub: Teacher, early years/pre: Standard Bathroom Accessibility: Yes   Home Equipment: Shower seat          Prior Functioning/Environment Level of Independence: Needs assistance  Gait / Transfers Assistance Needed: Reports she has been independent ADL's / Homemaking Assistance Needed: poor historian - reports she is independent however previous notes say she needs assistance Communication / Swallowing Assistance Needed: HOH          OT Problem List: Decreased strength;Decreased activity tolerance;Impaired balance (sitting and/or standing);Decreased cognition;Decreased safety awareness;Decreased knowledge of use of DME or AE;Cardiopulmonary status limiting activity;Pain      OT Treatment/Interventions: Self-care/ADL training;Therapeutic exercise;Neuromuscular education;Energy conservation;DME and/or AE instruction;Therapeutic activities;Cognitive remediation/compensation;Patient/family education;Balance training    OT Goals(Current goals can be found in the care plan section) Acute Rehab OT  Goals Patient Stated Goal: to go to the bathroom OT Goal Formulation: Patient unable to participate in goal setting Time For Goal Achievement: 02/27/21 Potential to Achieve Goals: Good  OT Frequency: Min 2X/week   Barriers to D/C:            Co-evaluation PT/OT/SLP Co-Evaluation/Treatment: Yes Reason for Co-Treatment: For patient/therapist safety PT goals addressed during session: Mobility/safety with mobility;Balance OT goals addressed during session: ADL's and self-care      AM-PAC OT "6 Clicks" Daily Activity     Outcome Measure Help from another person eating meals?: None Help from another person taking care of personal grooming?: A Little Help from another person toileting, which includes using toliet, bedpan, or urinal?: A Lot Help from another person bathing (including washing, rinsing, drying)?: A Little Help from another person to put on and taking off regular upper body clothing?: A Little Help from another person to put on and taking off regular lower body clothing?: A Little 6 Click Score: 18   End of Session Equipment Utilized During Treatment: Gait belt Nurse Communication: Mobility status  Activity Tolerance: Patient tolerated treatment well Patient left: in bed;with call bell/phone within reach;Other (comment) (stretcher)  OT Visit Diagnosis: Unsteadiness on feet (R26.81);Muscle weakness (generalized) (M62.81);Other symptoms and signs involving cognitive function;Pain Pain - part of body:  (bacj)                Time: 1096-0454 OT Time Calculation (min): 29 min Charges:  OT General Charges $OT Visit: 1 Visit OT Evaluation $OT Eval Moderate Complexity: [place], OT/L   Acute OT Clinical Specialist [place] Pager [phone] Office [phone]  Keen Ewalt,HILLARY 02/13/2021, 10:41 AM

## 2021-02-13 NOTE — H&P (Addendum)
History and Physical  Dawn [name] WJX:[id number] DOB: [birth year] DOA: 02/12/2021  Referring physician: Dr. Evette Cristal, [place] PCP: Lawerance Cruel, MD  Outpatient Specialists: Pulmonary, oncology. Patient coming from: Home.  Chief Complaint: Referred to the ED from her PCP's office due to symptomatic anemia.  HPI: Dawn [name] is a 80 y.o. female with medical history significant for left breast cancer diagnosed in December 2016 status post lumpectomy followed by adjuvant radiotherapy as well as hormonal therapy, large right middle lobe lung mass with subcarinal lymphadenopathy pending tissue biopsy, anemia of chronic disease, COPD, recent covid-19 viral infection, who presented to [facility] ED from home at her PCPs recommendation due to symptomatic anemia and hypotension.  Patient presented to her PCPs office with complaints of generalized weakness and worsening shortness of breath.  She was found to have a systolic blood pressure in the 80s, hemoglobin 7.  She was sent to the ED for further evaluation.  While in the ED her hemoglobin was repeated 6.8 with negative FOBT.  SBP was in the 80s and improved to the low 213'Y systolically.  2 units PRBCs were ordered to be transfused by EDP.  TRH, hospitalist team, was asked to admit.  ED Course:  Temperature 97.5.  BP 129/61, pulse 76, respiratory 17, O2 saturation 98% on room air.  Lab studies remarkable for serum sodium 129, glucose 207, BUN 17, creatinine 1.08, albumin 2.0, troponin 10, hemoglobin 6.8, WBC 7.6, platelet count 380.  Review of Systems: Review of systems as noted in the HPI. All other systems reviewed and are negative.   Past Medical History:  Diagnosis Date  . Anemia   . Anxiety   . Arthritis   . Breast cancer of upper-outer quadrant of left female breast ([place]) 08/18/2015  . Broken wrist    when younger x2  . COPD (chronic obstructive pulmonary disease) [facility])    Hospitalization March 2011  . Dementia ([place])   . Depression   . Diabetes  mellitus without complication ([place])    type 2  . Dyspnea   . Full dentures   . H/O vaginal delivery 1962   x1   . High cholesterol   . History of home oxygen therapy   . Hypertension   . Memory changes    sharp decline over the last 3 months, per son as of 01/22/21,   . Pneumonia   . Radiation    11/18/15- 12/16/15 to her Left Breast   Past Surgical History:  Procedure Laterality Date  . ABDOMINAL HYSTERECTOMY    . APPENDECTOMY    . CHOLECYSTECTOMY    . EYE SURGERY     cataracts  . RADIOACTIVE SEED GUIDED PARTIAL MASTECTOMY WITH AXILLARY SENTINEL LYMPH NODE BIOPSY Left 09/17/2015   Procedure: RADIOACTIVE SEED LOCALIZATION LEFT BREAST LUMPECTOMY AND LEFT AXILLARY SENTINEL LYMPH NODE BIOPSY;  Surgeon: Excell Seltzer, MD;  Location: [place];  Service: General;  Laterality: Left;  . TONSILLECTOMY      Social History:  reports that she has quit smoking. Her smoking use included cigarettes. She has a 70.00 pack-year smoking history. She has never used smokeless tobacco. She reports previous alcohol use. She reports that she does not use drugs.   Allergies  Allergen Reactions  . Ferumoxytol Shortness Of Breath, Itching and Other (See Comments)    Pt reported lightheadedness, a flushed feeling, SOB, and itching  . Pneumococcal Vaccines Hives, Swelling and Other (See Comments)    Arm became swollen  . Repaglinide Other (See Comments)  Dropped BGL too low    Family History  Problem Relation Age of Onset  . Colon cancer Sister   . Lung cancer Sister       Prior to Admission medications   Medication Sig Start Date End Date Taking? Authorizing Provider  albuterol (VENTOLIN HFA) 108 (90 Base) MCG/ACT inhaler Inhale 2 puffs into the lungs every 2 (two) hours as needed for wheezing or shortness of breath. Please instruct in usage 02/07/21   Medina-Vargas, Monina C, NP  anastrozole (ARIMIDEX) 1 MG tablet Take 1 tablet (1 mg total) by mouth daily. 02/07/21   Medina-Vargas, Monina C, NP   aspirin EC 81 MG tablet Take 81 mg by mouth daily.    [provider]  Cholecalciferol (VITAMIN D3) 125 MCG (5000 UT) CAPS Take 5,000 Units by mouth daily.    [provider]  ferrous sulfate 325 (65 FE) MG tablet Take 325 mg by mouth 2 (two) times daily.    [provider]  metFORMIN (GLUCOPHAGE) 500 MG tablet Take 1 tablet (500 mg total) by mouth daily. 02/07/21   Medina-Vargas, Monina C, NP  nicotine (NICODERM CQ - DOSED IN MG/24 HOURS) 14 mg/24hr patch Place 1 patch (14 mg total) onto the skin daily. Patient not taking: No sig reported 02/07/21   Medina-Vargas, Monina C, NP  nitroGLYCERIN (NITROSTAT) 0.4 MG SL tablet Place 0.4 mg under the tongue every 15 (fifteen) minutes as needed for chest pain.    [provider]  pantoprazole (PROTONIX) 40 MG tablet Take 1 tablet (40 mg total) by mouth daily before breakfast. 02/07/21 03/09/21  Medina-Vargas, Monina C, NP  pentoxifylline (TRENTAL) 400 MG CR tablet Take 1 tablet (400 mg total) by mouth 3 (three) times daily with meals. 02/07/21   Medina-Vargas, Monina C, NP  pioglitazone (ACTOS) 45 MG tablet Take 1 tablet (45 mg total) by mouth daily. 02/07/21   Medina-Vargas, Monina C, NP  quinapril (ACCUPRIL) 40 MG tablet Take 40 mg by mouth daily.    [provider]  rosuvastatin (CRESTOR) 40 MG tablet Take 1 tablet (40 mg total) by mouth daily. 02/07/21   Medina-Vargas, Monina C, NP  Tiotropium Bromide-Olodaterol (STIOLTO RESPIMAT) 2.5-2.5 MCG/ACT AERS Inhale 2 puffs into the lungs daily. Patient not taking: No sig reported 02/07/21   Medina-Vargas, Monina C, NP  Tiotropium Bromide-Olodaterol (STIOLTO RESPIMAT) 2.5-2.5 MCG/ACT AERS Inhale 2 puffs into the lungs daily. 02/11/21   Collene Gobble, MD  verapamil (CALAN-SR) 240 MG CR tablet Take 240 mg by mouth daily. 06/10/15   [provider]    Physical Exam: BP (!) 120/51 (BP Location: Right Arm)   Pulse 72   Temp 97.6 F (36.4 C) (Oral)   Resp 16    SpO2 98%   . General: 80 y.o. year-old female well developed well nourished in no acute distress.  Alert and interactive. . Cardiovascular: Regular rate and rhythm with no rubs or gallops.  No thyromegaly or JVD noted.  No lower extremity edema. 2/4 pulses in all 4 extremities. Marland Kitchen Respiratory: Mild rales at bases with no wheezing noted.  Poor inspiratory effort. . Abdomen: Soft nontender nondistended with normal bowel sounds x4 quadrants. . Muskuloskeletal: No cyanosis, clubbing or edema noted bilaterally . Neuro: CN II-XII intact, strength, sensation, reflexes . Skin: No ulcerative lesions noted or rashes . Psychiatry: Mood is appropriate for condition and setting          Labs on Admission:  Basic Metabolic Panel: Recent Labs  Lab 02/12/21 1850  NA 129*  K 4.1  CL 93*  CO2 28  GLUCOSE 207*  BUN 17  CREATININE 1.08*  CALCIUM 8.8*   Liver Function Tests: Recent Labs  Lab 02/12/21 1850  AST 15  ALT 10  ALKPHOS 65  BILITOT 0.4  PROT 5.1*  ALBUMIN 2.0*   Recent Labs  Lab 02/12/21 1850  LIPASE 27   No results for input(s): AMMONIA in the last 168 hours. CBC: Recent Labs  Lab 02/12/21 1850  WBC 7.6  NEUTROABS 5.8  HGB 6.8*  HCT 23.3*  MCV 92.1  PLT 380   Cardiac Enzymes: No results for input(s): CKTOTAL, CKMB, CKMBINDEX, TROPONINI in the last 168 hours.  BNP (last 3 results) Recent Labs    12/04/20 1903 12/11/20 1919 12/25/20 1722  BNP 96.3 103.7* 48.2    ProBNP (last 3 results) No results for input(s): PROBNP in the last 8760 hours.  CBG: No results for input(s): GLUCAP in the last 168 hours.  Radiological Exams on Admission: DG Chest 2 View  Result Date: 02/12/2021 CLINICAL DATA:  Generalized weakness, chest and epigastric pain, anemia, lung cancer EXAM: CHEST - 2 VIEW COMPARISON:  01/16/2021 FINDINGS: Frontal and lateral views of the chest demonstrate an unremarkable cardiac silhouette. There is persistent consolidation within the right middle  lobe compatible with known lung cancer. Small bilateral pleural effusions, right greater than left. The left chest is clear. No pneumothorax. IMPRESSION: 1. Persistent right middle lobe mass consistent with known lung cancer. 2. Small bilateral pleural effusions, right greater than left. Electronically Signed   By: Randa Ngo M.D.   On: 02/12/2021 23:34    EKG: I independently viewed the EKG done and my findings are as followed: Sinus rhythm rate of 86, nonspecific ST-T changes.  QTc 418.  Assessment/Plan Present on Admission: . Symptomatic anemia  Active Problems:   Symptomatic anemia  Symptomatic anemia, referred by PCP. Presented to her PCPs office with complaints of generalized weakness, worsening shortness of breath. Found to be anemic and was asked to go to the ED for further evaluation and management In the ED hemoglobin 6.8 with negative FOBT. Suspect anemia related to malignancy. 2 units PRBCs ordered to be transfused Repeat CBC posttransfusion.  Large right middle lobe lung mass with concern for malignancy. History of left breast cancer Pending tissue biopsy, previously postponed in the setting of positive COVID-19 screening test. She follows with pulmonary and oncology outpatient. She is on hormonal therapy for history of left breast cancer, resume  Hypovolemic hyponatremia  Serum sodium 129 Continue gentle IV fluid hydration Repeat renal panel in the morning.  Type 2 diabetes with hyperglycemia Obtain hemoglobin A1c Hold off home oral hypoglycemics. Insulin sliding scale.  CKD 3A Appears to be at her baseline creatinine 1.08 with GFR 53 Continue to avoid nephrotoxic agents, dehydration and hypotension. Monitor urine output Repeat BMP in the morning.  Recent covid-19 viral infection Previously diagnosed on 01/22/21  Generalized weakness/physical debility PT OT to assess Fall precautions  Goals of care Palliative care team consulted to assist with  establishing goals of care Currently the patient is full code.   DVT prophylaxis: SCDs.  Code Status: Full code.  Family Communication: None at bedside.  Disposition Plan: Admit to MedSurg with remote telemetry.  Consults called: None.  Admission status: Observation status.   Status is: Observation    Dispo:  Patient From: Home  Planned Disposition: Home likely on 02/13/2021 post PRBCs transfusion.  Medically stable for discharge: No  Kayleen Memos MD [facility] Pager [phone]  If 7PM-7AM, please contact night-coverage [email or web address] Password [email or web address]  02/13/2021, 1:00 AM

## 2021-02-13 NOTE — ED Notes (Signed)
Pt is currently receiving blood- [place]

## 2021-02-13 NOTE — Consult Note (Signed)
Consultation Note Date: 02/13/2021   Patient Name: Dawn [name]  DOB: March 26, 1941  MRN: [id number]  Age / Sex: 80 y.o., female  PCP: Lawerance Cruel, MD Referring Physician: Mercy Riding, MD  Reason for Consultation: Establishing goals of care  HPI/Patient Profile: 80 y.o. female  with past medical history of dementia, chronic anemia, breast cancer s/p lumpectomy, radiation and hormonal therapy, new finding of lung mass with no evidence of metastatic disease- bronchoscopy has been cancelled twice (once for cardiac workup, and once due to COVID pre procedure + test), admitted on 02/12/2021 with symptomatic anemia. Palliative medicine consulted for [place] discussion.    Clinical Assessment and Goals of Care:  I have reviewed medical records including EPIC notes, labs and imaging, received report from RN, assessed the patient and then met at the bedside along with to discuss diagnosis prognosis, GOC, EOL wishes, disposition and options.  I introduced Palliative Medicine as specialized medical care for people living with serious illness. It focuses on providing relief from the symptoms and stress of a serious illness. The goal is to improve quality of life for both the patient and the family.  Patient was in bed awake and alert.  Oriented only to herself.  She would briefly remember she is in the hospital when she was told.  However, a few minutes later, she would ask again, where am I?  Why am I here? She is not able to retain information regarding her health or plan of care.  Unfortunately she is not able to participate in a goals of care discussion, or make decisions regarding her care.  I called her son, [name].  Prior to this admission patient had been residing at [facility] and then was discharged to his home he had been working on getting care in his home for her in addition to the care that he provides.  she  ambulates minimally takes a lot of encouragement to get up out of bed.  She requires assistance with all of her ADLs, except that she is able to feed herself.  [name] has been frustrated by the delays in getting her lung mass biopsied.  He shares that her primary provider, Dr. Harrington Challenger, had mentioned that it may be time to start discussing hospice.  We discussed Dawn [name]'s multiple comorbidities that put her at a high risk of decompensation and dying within the next few years and that also affect her quality of life.  CODE STATUS was discussed.  The risks and benefits and likely outcomes of full resuscitative efforts were presented.   We also discussed the various levels of care that can be provided to chronically ill patients.  These include continued aggressive care with all treatments all interventions tests labs surgeries etc.  Versus intermediate level care which would treat what is treatable but no invasive procedures or surgeries, versus comfort measures only intended to keep patient pain-free clean dry and comfortable however allow natural illnesses to take their natural course and support with symptom management.  [name] was somewhat overwhelmed  as this is the first time he has had this type of discussion.  I encouraged Jenny Reichmann that he did not have to make these decisions today that this is only the beginning of an ongoing conversation.  He received another phone call from the hospital and our conversation ended there.      Primary Decision Maker NEXT OF KIN- son- [name]    SUMMARY OF RECOMMENDATIONS -Continue full scope, full code -Plan for patient to d/c home with son -PMT plan to call [name] tomorrow for further discussion- anticipate referring to outpatient Palliative    Code Status/Advance Care Planning:  Full code  Prognosis:    Unable to determine  Discharge Planning: To Be Determined  Primary Diagnoses: Present on Admission: . Symptomatic anemia   I have reviewed the  medical record, interviewed the patient and family, and examined the patient. The following aspects are pertinent.  Past Medical History:  Diagnosis Date  . Anemia   . Anxiety   . Arthritis   . Breast cancer of upper-outer quadrant of left female breast ([place]) 08/18/2015  . Broken wrist    when younger x2  . COPD (chronic obstructive pulmonary disease) [facility])    Hospitalization March 2011  . Dementia ([place])   . Depression   . Diabetes mellitus without complication ([place])    type 2  . Dyspnea   . Full dentures   . H/O vaginal delivery 1962   x1   . High cholesterol   . History of home oxygen therapy   . Hypertension   . Memory changes    sharp decline over the last 3 months, per son as of 01/22/21,   . Pneumonia   . Radiation    11/18/15- 12/16/15 to her Left Breast   Social History   Socioeconomic History  . Marital status: Legally Separated    Spouse name: Not on file  . Number of children: Not on file  . Years of education: Not on file  . Highest education level: Not on file  Occupational History  . Not on file  Tobacco Use  . Smoking status: Former Smoker    Packs/day: 1.00    Years: 70.00    Pack years: 70.00    Types: Cigarettes  . Smokeless tobacco: Never Used  . Tobacco comment: quit 5 weeks ago AR[facility] 02/11/21  Vaping Use  . Vaping Use: Never used  Substance and Sexual Activity  . Alcohol use: Not Currently    Comment: rare  . Drug use: No  . Sexual activity: Not on file    Comment: Hysterectomy  Other Topics Concern  . Not on file  Social History Narrative  . Not on file   Social Determinants of Health   Financial Resource Strain: Not on file  Food Insecurity: Not on file  Transportation Needs: Not on file  Physical Activity: Not on file  Stress: Not on file  Social Connections: Not on file   Scheduled Meds: . anastrozole  1 mg Oral Daily  . ferrous sulfate  325 mg Oral BID  . insulin aspart  0-5 Units Subcutaneous QHS  . insulin aspart  0-9 Units  Subcutaneous TID WC  . pantoprazole  40 mg Oral QAC breakfast  . rosuvastatin  40 mg Oral Daily   Continuous Infusions: . lactated ringers Stopped (02/13/21 0911)   PRN Meds:.acetaminophen, albuterol Medications Prior to Admission:  Prior to Admission medications   Medication Sig Start Date End Date Taking? Authorizing Provider  albuterol (  VENTOLIN HFA) 108 (90 Base) MCG/ACT inhaler Inhale 2 puffs into the lungs every 2 (two) hours as needed for wheezing or shortness of breath. Please instruct in usage 02/07/21  Yes Medina-Vargas, Monina C, NP  anastrozole (ARIMIDEX) 1 MG tablet Take 1 tablet (1 mg total) by mouth daily. 02/07/21  Yes Medina-Vargas, Monina C, NP  aspirin EC 81 MG tablet Take 81 mg by mouth daily.   Yes [provider]  Cholecalciferol (VITAMIN D3) 125 MCG (5000 UT) CAPS Take 5,000 Units by mouth daily.   Yes [provider]  ferrous sulfate 325 (65 FE) MG tablet Take 325 mg by mouth 2 (two) times daily.   Yes [provider]  metFORMIN (GLUCOPHAGE) 500 MG tablet Take 1 tablet (500 mg total) by mouth daily. 02/07/21  Yes Medina-Vargas, Monina C, NP  nitroGLYCERIN (NITROSTAT) 0.4 MG SL tablet Place 0.4 mg under the tongue every 15 (fifteen) minutes as needed for chest pain.   Yes [provider]  pantoprazole (PROTONIX) 40 MG tablet Take 1 tablet (40 mg total) by mouth daily before breakfast. 02/07/21 03/09/21 Yes Medina-Vargas, Monina C, NP  pentoxifylline (TRENTAL) 400 MG CR tablet Take 1 tablet (400 mg total) by mouth 3 (three) times daily with meals. 02/07/21  Yes Medina-Vargas, Monina C, NP  pioglitazone (ACTOS) 45 MG tablet Take 1 tablet (45 mg total) by mouth daily. 02/07/21  Yes Medina-Vargas, Monina C, NP  quinapril (ACCUPRIL) 40 MG tablet Take 40 mg by mouth daily.   Yes [provider]  rosuvastatin (CRESTOR) 40 MG tablet Take 1 tablet (40 mg total) by mouth daily. 02/07/21  Yes Medina-Vargas, Monina C, NP  Tiotropium  Bromide-Olodaterol (STIOLTO RESPIMAT) 2.5-2.5 MCG/ACT AERS Inhale 2 puffs into the lungs daily. 02/11/21  Yes Collene Gobble, MD  verapamil (CALAN-SR) 240 MG CR tablet Take 240 mg by mouth daily. 06/10/15  Yes [provider]  nicotine (NICODERM CQ - DOSED IN MG/24 HOURS) 14 mg/24hr patch Place 1 patch (14 mg total) onto the skin daily. 02/07/21   Medina-Vargas, Monina C, NP   Allergies  Allergen Reactions  . Ferumoxytol Shortness Of Breath, Itching and Other (See Comments)    Pt reported lightheadedness, a flushed feeling, SOB, and itching  . Pneumococcal Vaccines Hives, Swelling and Other (See Comments)    Arm became swollen  . Repaglinide Other (See Comments)    Dropped BGL too low   Review of Systems  Unable to perform ROS: Dementia    Physical Exam Vitals and nursing note reviewed.  Constitutional:      Appearance: Normal appearance.  Cardiovascular:     Pulses: Normal pulses.  Pulmonary:     Effort: Pulmonary effort is normal.  Neurological:     Mental Status: She is alert. She is disoriented.     Vital Signs: BP (!) 158/76 (BP Location: Right Arm)   Pulse 75   Temp 97.9 F (36.6 C) (Oral)   Resp 16   SpO2 91%  Pain Scale: 0-10   Pain Score: 2    SpO2: SpO2: 91 % O2 Device:SpO2: 91 % O2 Flow Rate: .   IO: Intake/output summary:   Intake/Output Summary (Last 24 hours) at 02/13/2021 1235 Last data filed at 02/13/2021 0332 Gross per 24 hour  Intake 354 ml  Output --  Net 354 ml    LBM:   Baseline Weight:   Most recent weight:       Palliative Assessment/Data: PPS: 30%     Thank you  for this consult. Palliative medicine will continue to follow and assist as needed.   Time In: 1120 Time Out: 1252 Time Total: 92 minutes Greater than 50%  of this time was spent counseling and coordinating care related to the above assessment and plan.  Signed by: Mariana Kaufman, AGNP-C Palliative Medicine    Please contact Palliative Medicine Team phone at  [phone] for questions and concerns.  For individual provider: See Shea Evans

## 2021-02-14 ENCOUNTER — Inpatient Hospital Stay (HOSPITAL_COMMUNITY): Payer: Medicare Other

## 2021-02-14 DIAGNOSIS — J90 Pleural effusion, not elsewhere classified: Secondary | ICD-10-CM

## 2021-02-14 DIAGNOSIS — E1165 Type 2 diabetes mellitus with hyperglycemia: Secondary | ICD-10-CM

## 2021-02-14 DIAGNOSIS — S22080A Wedge compression fracture of T11-T12 vertebra, initial encounter for closed fracture: Secondary | ICD-10-CM

## 2021-02-14 DIAGNOSIS — F0390 Unspecified dementia without behavioral disturbance: Secondary | ICD-10-CM

## 2021-02-14 DIAGNOSIS — I739 Peripheral vascular disease, unspecified: Secondary | ICD-10-CM

## 2021-02-14 LAB — RENAL FUNCTION PANEL
Albumin: 2 g/dL — ABNORMAL LOW (ref 3.5–5.0)
Anion gap: 10 (ref 5–15)
BUN: 12 mg/dL (ref 8–23)
CO2: 27 mmol/L (ref 22–32)
Calcium: 8.9 mg/dL (ref 8.9–10.3)
Chloride: 96 mmol/L — ABNORMAL LOW (ref 98–111)
Creatinine, Ser: 1.1 mg/dL — ABNORMAL HIGH (ref 0.44–1.00)
GFR, Estimated: 51 mL/min — ABNORMAL LOW (ref >60)
Glucose, Bld: 163 mg/dL — ABNORMAL HIGH (ref 70–99)
Phosphorus: 3.2 mg/dL (ref 2.5–4.6)
Potassium: 3.5 mmol/L (ref 3.5–5.1)
Sodium: 133 mmol/L — ABNORMAL LOW (ref 135–145)

## 2021-02-14 LAB — CBC
HCT: 32.3 % — ABNORMAL LOW (ref 36.0–46.0)
Hemoglobin: 10.3 g/dL — ABNORMAL LOW (ref 12.0–15.0)
MCH: 28.6 pg (ref 26.0–34.0)
MCHC: 31.9 g/dL (ref 30.0–36.0)
MCV: 89.7 fL (ref 80.0–100.0)
Platelets: 321 10*3/uL (ref 150–400)
RBC: 3.6 MIL/uL — ABNORMAL LOW (ref 3.87–5.11)
RDW: 17.6 % — ABNORMAL HIGH (ref 11.5–15.5)
WBC: 6.3 10*3/uL (ref 4.0–10.5)
nRBC: 0.3 % — ABNORMAL HIGH (ref 0.0–0.2)

## 2021-02-14 LAB — URINE CULTURE

## 2021-02-14 LAB — TYPE AND SCREEN
ABO/RH(D): O POS
Antibody Screen: NEGATIVE
Unit division: 0
Unit division: 0

## 2021-02-14 LAB — BPAM RBC
Blood Product Expiration Date: 202207052359
Blood Product Expiration Date: 202207062359
ISSUE DATE / TIME: 202206030026
ISSUE DATE / TIME: 202206030532
Unit Type and Rh: 5100
Unit Type and Rh: 5100

## 2021-02-14 LAB — BODY FLUID CELL COUNT WITH DIFFERENTIAL
Eos, Fluid: 0 %
Lymphs, Fluid: 70 %
Monocyte-Macrophage-Serous Fluid: 21 % — ABNORMAL LOW (ref 50–90)
Neutrophil Count, Fluid: 9 % (ref 0–25)
Other Cells, Fluid: 1 %
Total Nucleated Cell Count, Fluid: 635 cu mm (ref 0–1000)

## 2021-02-14 LAB — LACTATE DEHYDROGENASE, PLEURAL OR PERITONEAL FLUID: LD, Fluid: 121 U/L — ABNORMAL HIGH (ref 3–23)

## 2021-02-14 LAB — HEMOGLOBIN A1C
Hgb A1c MFr Bld: 7.4 % — ABNORMAL HIGH (ref 4.8–5.6)
Mean Plasma Glucose: 166 mg/dL

## 2021-02-14 LAB — MAGNESIUM: Magnesium: 1.9 mg/dL (ref 1.7–2.4)

## 2021-02-14 LAB — PROTEIN, PLEURAL OR PERITONEAL FLUID: Total protein, fluid: <3 g/dL

## 2021-02-14 LAB — GLUCOSE, CAPILLARY
Glucose-Capillary: 167 mg/dL — ABNORMAL HIGH (ref 70–99)
Glucose-Capillary: 162 mg/dL — ABNORMAL HIGH (ref 70–99)

## 2021-02-14 MED ORDER — METFORMIN HCL 500 MG PO TABS: 500.0000 mg | ORAL_TABLET | Freq: Two times a day (BID) | ORAL | 1 refills | Status: DC

## 2021-02-14 MED ORDER — LIDOCAINE HCL (PF) 1 % IJ SOLN
INTRAMUSCULAR | Status: AC
  Filled 2021-02-14: qty 30

## 2021-02-14 MED ORDER — ACETAMINOPHEN 500 MG PO TABS: 500.0000 mg | ORAL_TABLET | Freq: Three times a day (TID) | ORAL | Status: AC

## 2021-02-14 MED ORDER — POTASSIUM CHLORIDE CRYS ER 20 MEQ PO TBCR
40.0000 meq | EXTENDED_RELEASE_TABLET | Freq: Once | ORAL | Status: AC
  Administered 2021-02-14: 40 meq via ORAL
  Filled 2021-02-14: qty 2

## 2021-02-14 MED ORDER — PIOGLITAZONE HCL 15 MG PO TABS: 15.0000 mg | ORAL_TABLET | Freq: Every day | ORAL | 1 refills | Status: AC

## 2021-02-14 MED ORDER — PENTOXIFYLLINE ER 400 MG PO TBCR: 400.0000 mg | EXTENDED_RELEASE_TABLET | Freq: Three times a day (TID) | ORAL | 0 refills | Status: DC

## 2021-02-14 NOTE — TOC Transition Note (Signed)
Transition of Care [facility]) - CM/SW Discharge Note   Patient Details  Name: ROSABEL SERMENO MRN: [id number] Date of Birth: 1941-01-26  Transition of Care [facility]) CM/SW Contact:  Bartholomew Crews, RN Phone Number: [phone] 02/14/2021, 1:02 PM   Clinical Narrative:     Patient to transition home today. HH arranged by previous TOC RNCM. Jason at [facility] notified of discharge. No further TOC needs identified at this time.   Final next level of care: Home w Home Health Services Barriers to Discharge: No Barriers Identified   Patient Goals and CMS Choice   CMS Medicare.gov Compare Post Acute Care list provided to:: Patient Choice offered to / list presented to : [place]  Discharge Placement                       Discharge Plan and Services   Discharge Planning Services: CM Consult Post Acute Care Choice: Home Health          DME Arranged: N/A         HH Arranged: RN,PT [place] Agency: [place] ([facility]) Date HH Agency Contacted: 02/14/21 Time [place]: [place] Representative spoke with at [place]: [place] ([place]) Interventions     Readmission Risk Interventions No flowsheet data found.

## 2021-02-14 NOTE — Procedures (Signed)
PROCEDURE SUMMARY:  Successful image-guided right thoracentesis. Yielded 500 milliliters of clear gold fluid. Patient tolerated procedure well. No immediate complications. EBL = 0 mL.  Specimen was sent for labs. CXR ordered.  Please see imaging section of Epic for full dictation.   Claris Pong Dariusz Brase PA-C 02/14/2021 9:50 AM

## 2021-02-14 NOTE — Progress Notes (Signed)
Discharge instructions given to patient Dawn [name] and son Dawn [name]. Both verbalized understanding

## 2021-02-14 NOTE — Progress Notes (Signed)
Patient discharged to home with son via wheelchair with all belongings

## 2021-02-14 NOTE — Progress Notes (Signed)
Patients oxygen oxygen level was 92% on room air while ambulating.

## 2021-02-14 NOTE — Discharge Summary (Signed)
Physician Discharge Summary  Dawn [name]:[id number] DOB: 1941/03/11 DOA: 02/12/2021  PCP: Lawerance Cruel, MD  Admit date: 02/12/2021 Discharge date: 02/14/2021  Admitted From: Home Disposition: Home  Recommendations for Outpatient Follow-up:  1. Follow ups as below. 2. Please obtain CBC/BMP/Mag at follow up 3. Recommended outpatient referral to palliative medicine 4. Please follow up on the following pending results: Pleural fluid studies  Home Health: PT/OT Equipment/Devices: None  Discharge Condition: Stable CODE STATUS: Full code   [name], Dawn Follow up.   Contact information: [place] [address] [phone]               Hospital Course: 80 year old female with history of COPD, left breast cancer s/p lumpectomy, radiotherapy and hormonal therapy, cognitive deficits, RML mass with subcarinal LAD pending tissue biopsy anxiety, recent COVID-19 infection, tobacco use disorder, CKD-3A and anemia of chronic disease presented to PCP for routine follow and directed to ED due to hypotension and anemia with hemoglobin down to 7.0.  Repeat Hgb in ED 6.8.  She was transfused 2 units with appropriate response.  Per patient's son, patient has chronic dyspnea and progressive generalized weakness.   Patient was to be discharged from ED but desaturated to 88% with increased shortness of breath and chest pain during therapy evaluation.  Therapy recommended SNF. Patient Further work-up reveals normal BNP but elevated D-dimer. CTA chest ordered.  Started on breathing treatments.  CTA chest negative for PE but known RML mass with possible atelectasis, moderate right-sided pleural effusion, new mild T12 compression fracture and intra-abdominal PAD.  She had thoracocentesis with removal of 500 cc clear golden fluid without complication.  On the day of discharge, H&H remained stable.  Hypotension resolved.   Breathing improved.  Ambulated on room air and maintain saturation above 92%.  Fluid culture and cytology pending.   Discharge Diagnoses:  Acute hypoxic respiratory failure-likely due to anemia, COPD, lung mass, atelectasis, recent COVID infection and right pleural effusion.  CTA chest as above.  Status post thoracocentesis.  Resolved. -Continue Stiolto and as needed albuterol -Has upcoming appointment with pulm for bronchoscopy  Symptomatic anemia: Likely anemia of chronic disease.  Hemoccult negative. Transfused 2 units with exaggerated response.   Anemia panel basically normal.  H&H stable. Recent Labs    12/25/20 1755 01/01/21 1633 01/10/21 1551 01/11/21 0305 01/16/21 1517 01/16/21 2032 01/30/21 0000 02/12/21 1850 02/13/21 1034 02/14/21 0136  HGB 11.7* 13.2 12.5 11.4* 9.1* 9.0* 7.6* 6.8* 11.0* 10.3*  -Recheck CBC at follow-up  Mild COPD exacerbation-has hypoxia, shortness of breath and dry cough -Continue Stiolto and as needed albuterol -Did not need systemic steroid  Moderate right pleural effusion-s/p thoracocentesis with removal of 500 cc fluid. -Follow fluid cytology and culture  Large RML lung mass with subcarinal LAD-concern for malignancy -Followed by Dr. Lamonte Sakai.    Upcoming bronchoscopy on 6/6.  Mild T12 compression fracture-reportedly had a couple of falls days before admission. -As needed Tylenol. -Resume home PT/OT  Hyponatremia-clinical picture suggestive of hypovolemic hyponatremia but patient's son states she does drink about a gallon of water a day.  He does not think she is dehydrated.  She is also on quinapril which could contribute.  Hyponatremia improved. -Discontinued quinapril. -Recheck BMP in 1 to 2 weeks  NIDDM-2 with hyperglycemia: A1c 7.4%. Recent Labs  Lab 02/13/21 1222 02/13/21 1759 02/13/21 2255 02/14/21 0819 02/14/21 1138  GLUCAP 152* 142* 187* 167* 162*  -Increase  metformin to 500 mg twice daily -Decrease Actos to 15 mg given  risk for fluid retention -Continue statin.  Recent AKI-thought to have CKD which seems to be not the case -Discontinued quinapril -Recheck renal function at follow-up.  History of left breast cancer status postlumpectomy, radiation and hormonal therapy -Continue home medications  Mild RUQ tenderness: LFT within normal.  Doubt cholecystitis.  Murphy sign negative. -Continue monitoring unless worse.  Dementia without behavioral disturbance: Significant progressive cognitive decline with short-term memories over the last 3 months per patient's son. -Reorientation and delirium precautions  Generalized weakness/physical debility-was at [facility] for 24 days and discharged home on 5/27 -Therapy recommending SNF but patient's son chose to take patient home  Intra-abdominal PAD -Continue statin  Goals of care -Palliative medicine recommended outpatient follow-up.   There is no height or weight on file to calculate BMI.            Discharge Exam: Vitals:   02/14/21 1350 02/14/21 1404  BP:  (!) 131/56  Pulse:  91  Resp:  16  Temp:  97.8 F (36.6 C)  SpO2: 92% 94%    GENERAL: No apparent distress.  Nontoxic. HEENT: MMM.  Vision and hearing grossly intact.  NECK: Supple.  No apparent JVD.  RESP:  No IWOB.  Fair aeration bilaterally. CVS:  RRR. Heart sounds normal.  ABD/GI/GU: Bowel sounds present. Soft. Non tender.  MSK/EXT:   No edema.  Tenderness over T12 spine and adjacent paraspinal muscles SKIN: no apparent skin lesion or wound NEURO: Awake and alert.  Oriented to self, place, year and the president.  No apparent focal neuro deficit. PSYCH: Calm. Normal affect.   Discharge Instructions  Discharge Instructions    Call MD for:  difficulty breathing, headache or visual disturbances   Complete by: As directed    Call MD for:  extreme fatigue   Complete by: As directed    Call MD for:  persistant dizziness or light-headedness   Complete by: As directed     Call MD for:  severe uncontrolled pain   Complete by: As directed    Call MD for:  temperature >100.4   Complete by: As directed    Diet - low sodium heart healthy   Complete by: As directed    Diet Carb Modified   Complete by: As directed    Discharge instructions   Complete by: As directed    It has been a pleasure taking care of you!  You were hospitalized due to low blood pressure and low hemoglobin.  You have been treated with blood transfusion and your hemoglobin improved.  We have stopped your blood pressure medications.  We also adjusted your diabetic medication during this hospitalization.  Please review your new medication list and the directions on your medications before you take them.  Follow-up with Dr. Lamonte Sakai as previously planned.  Follow-up with your primary care doctor in 1 to 2 weeks.   Take care,   Increase activity slowly   Complete by: As directed      Allergies as of 02/14/2021      Reactions   Ferumoxytol Shortness Of Breath, Itching, Other (See Comments)   Pt reported lightheadedness, a flushed feeling, SOB, and itching   Pneumococcal Vaccines Hives, Swelling, Other (See Comments)   Arm became swollen   Repaglinide Other (See Comments)   Dropped BGL too low      Medication List    STOP taking these medications   quinapril 40 MG tablet  Commonly known as: ACCUPRIL   verapamil 240 MG CR tablet Commonly known as: CALAN-SR     TAKE these medications   acetaminophen 500 MG tablet Commonly known as: TYLENOL Take 1 tablet (500 mg total) by mouth every 8 (eight) hours.   albuterol 108 (90 Base) MCG/ACT inhaler Commonly known as: VENTOLIN HFA Inhale 2 puffs into the lungs every 2 (two) hours as needed for wheezing or shortness of breath. Please instruct in usage   anastrozole 1 MG tablet Commonly known as: ARIMIDEX Take 1 tablet (1 mg total) by mouth daily.   aspirin EC 81 MG tablet Take 81 mg by mouth daily.   ferrous sulfate 325 (65 FE) MG  tablet Take 325 mg by mouth 2 (two) times daily.   metFORMIN 500 MG tablet Commonly known as: GLUCOPHAGE Take 1 tablet (500 mg total) by mouth 2 (two) times daily with a meal. What changed: when to take this   nicotine 14 mg/24hr patch Commonly known as: NICODERM CQ - dosed in mg/24 hours Place 1 patch (14 mg total) onto the skin daily.   nitroGLYCERIN 0.4 MG SL tablet Commonly known as: NITROSTAT Place 0.4 mg under the tongue every 15 (fifteen) minutes as needed for chest pain.   pantoprazole 40 MG tablet Commonly known as: PROTONIX Take 1 tablet (40 mg total) by mouth daily before breakfast.   pentoxifylline 400 MG CR tablet Commonly known as: TRENTAL Take 1 tablet (400 mg total) by mouth 3 (three) times daily with meals. Start taking on: February 18, 2021 What changed: These instructions start on February 18, 2021. If you are unsure what to do until then, ask your doctor or other care provider.   pioglitazone 15 MG tablet Commonly known as: ACTOS Take 1 tablet (15 mg total) by mouth daily. What changed:   medication strength  how much to take   rosuvastatin 40 MG tablet Commonly known as: CRESTOR Take 1 tablet (40 mg total) by mouth daily.   Stiolto Respimat 2.5-2.5 MCG/ACT Aers Generic drug: Tiotropium Bromide-Olodaterol Inhale 2 puffs into the lungs daily.   Vitamin D3 125 MCG (5000 UT) Caps Take 5,000 Units by mouth daily.       Consultations:  Interventional radiology for thoracocentesis  Procedures/Studies:  6/4-thoracocentesis for right moderate pleural effusion with removal of 500 cc fluid   DG Chest 1 View  Result Date: 02/14/2021 CLINICAL DATA:  Status post thoracentesis EXAM: CHEST  1 VIEW COMPARISON:  02/12/2021 FINDINGS: Interval decrease in volume of a right pleural effusion, with improved aeration of the right lung. There is a redemonstrated large mass of the right middle lobe. The left lung is normally aerated. No pneumothorax. IMPRESSION: 1.  Interval decrease in volume of a right pleural effusion, with improved aeration of the right lung. No pneumothorax. 2. There is a redemonstrated large mass of the right middle lobe. Electronically Signed   By: Eddie Candle M.D.   On: 02/14/2021 09:48   DG Chest 2 View  Result Date: 02/12/2021 CLINICAL DATA:  Generalized weakness, chest and epigastric pain, anemia, lung cancer EXAM: CHEST - 2 VIEW COMPARISON:  01/16/2021 FINDINGS: Frontal and lateral views of the chest demonstrate an unremarkable cardiac silhouette. There is persistent consolidation within the right middle lobe compatible with known lung cancer. Small bilateral pleural effusions, right greater than left. The left chest is clear. No pneumothorax. IMPRESSION: 1. Persistent right middle lobe mass consistent with known lung cancer. 2. Small bilateral pleural effusions, right greater than left. Electronically  Signed   By: Randa Ngo M.D.   On: 02/12/2021 23:34   DG Chest 2 View  Result Date: 01/16/2021 CLINICAL DATA:  Chest pain EXAM: CHEST - 2 VIEW COMPARISON:  01/10/2021 radiograph and chest CT, chest x-ray 12/04/2020 FINDINGS: Right middle lobe lung mass redemonstrated, with extension to adjacent pleural surface. Small bilateral pleural effusions. Emphysema. Cardiomegaly with aortic atherosclerosis. No pneumothorax. Calcified granuloma in the right lung base. IMPRESSION: 1. Redemonstrated right middle lobe lung mass 2. Interim small bilateral pleural effusions. 3. Mild cardiomegaly.  Emphysema. Electronically Signed   By: Donavan Foil M.D.   On: 01/16/2021 16:07   CT ANGIO CHEST PE W OR WO CONTRAST  Result Date: 02/13/2021 CLINICAL DATA:  History of breast cancer.  Elevated D-dimer. EXAM: CT ANGIOGRAPHY CHEST WITH CONTRAST TECHNIQUE: Multidetector CT imaging of the chest was performed using the standard protocol during bolus administration of intravenous contrast. Multiplanar CT image reconstructions and MIPs were obtained to evaluate the  vascular anatomy. CONTRAST:  33mL OMNIPAQUE IOHEXOL 350 MG/ML SOLN COMPARISON:  01/02/2021 FINDINGS: Cardiovascular: No filling defects in the pulmonary arteries to suggest pulmonary emboli. Aortic atherosclerosis. Scattered coronary artery calcifications. Heart is normal size. Aorta normal caliber. Mediastinum/Nodes: Prominent mediastinal lymph nodes. 12 mm precarinal lymph node. Similar sized AP window lymph node. No axillary adenopathy. Lungs/Pleura: Central mass in the right middle lobe is again noted, difficult to measure precisely given peripheral postobstructive atelectasis or infiltrate, but measures approximately 6 x 3.8 cm. Moderate right pleural effusion. Compressive atelectasis in the right lower lobe. Left lung clear. Upper Abdomen: Calcifications in the spleen compatible with old granulomatous disease. No acute findings. Musculoskeletal: Mild compression fracture through the superior endplate of L46, new since prior study. Review of the MIP images confirms the above findings. IMPRESSION: No evidence of pulmonary embolus. Large central right middle lobe mass again noted, difficult to measure due to adjacent postobstructive atelectasis or infiltrate. Moderate right pleural effusion. New mild compression fracture through the superior endplate of T03. Coronary artery disease. Aortic Atherosclerosis (ICD10-I70.0). Electronically Signed   By: Rolm Baptise M.D.   On: 02/13/2021 15:17   US THORACENTESIS ASP PLEURAL SPACE W/IMG GUIDE  Result Date: 02/14/2021 INDICATION: Patient with history of RML lung mass, COPD exacerbation, acute hypoxic respiratory failure, and right pleural effusion. Request is made for diagnostic and therapeutic right thoracentesis. EXAM: ULTRASOUND GUIDED DIAGNOSTIC AND THERAPEUTIC RIGHT THORACENTESIS MEDICATIONS: 10 mL 1% lidocaine COMPLICATIONS: None immediate. PROCEDURE: An ultrasound guided thoracentesis was thoroughly discussed with the patient and questions answered. The  benefits, risks, alternatives and complications were also discussed. The patient understands and wishes to proceed with the procedure. Written consent was obtained. Ultrasound was performed to localize and mark an adequate pocket of fluid in the right chest. The area was then prepped and draped in the normal sterile fashion. 1% Lidocaine was used for local anesthesia. Under ultrasound guidance a 6 Fr Safe-T-Centesis catheter was introduced. Thoracentesis was performed. The catheter was removed and a dressing applied. FINDINGS: A total of approximately 500 mL of clear gold fluid was removed. Samples were sent to the laboratory as requested by the clinical team. IMPRESSION: Successful ultrasound guided right thoracentesis yielding 500 mL of pleural fluid. Read by: Earley Abide, PA-C Electronically Signed   By: Aletta Edouard M.D.   On: 02/14/2021 10:02        The results of significant diagnostics from this hospitalization (including imaging, microbiology, ancillary and laboratory) are listed below for reference.     Microbiology:  Recent Results (from the past 240 hour(s))  Urine culture     Status: Abnormal   Collection Time: 02/12/21  6:50 PM   Specimen: Urine, Random  Result Value Ref Range Status   Specimen Description URINE, RANDOM  Final   Special Requests   Final    NONE Performed at [facility], [address] [place], [place] [address]    Culture MULTIPLE SPECIES PRESENT, SUGGEST RECOLLECTION (A)  Final   Report Status 02/14/2021 FINAL  Final  Culture, blood (routine x 2)     Status: None (Preliminary result)   Collection Time: 02/12/21  7:15 PM   Specimen: BLOOD  Result Value Ref Range Status   Specimen Description BLOOD SITE NOT SPECIFIED  Final   Special Requests   Final    BOTTLES DRAWN AEROBIC AND ANAEROBIC Blood Culture results may not be optimal due to an excessive volume of blood received in culture bottles   Culture   Final    NO GROWTH 2 DAYS Performed at  [facility], [place] [address] [place], [place] [address]    Report Status PENDING  Incomplete  Culture, blood (routine x 2)     Status: None (Preliminary result)   Collection Time: 02/12/21  7:29 PM   Specimen: BLOOD  Result Value Ref Range Status   Specimen Description BLOOD SITE NOT SPECIFIED  Final   Special Requests   Final    BOTTLES DRAWN AEROBIC ONLY Blood Culture results may not be optimal due to an inadequate volume of blood received in culture bottles   Culture   Final    NO GROWTH 2 DAYS Performed at [facility], [place] [address] [place], [place] [address]    Report Status PENDING  Incomplete  Resp Panel by RT-PCR (Flu A&B, Covid) Nasopharyngeal Swab     Status: Abnormal   Collection Time: 02/12/21 10:56 PM   Specimen: Nasopharyngeal Swab; Nasopharyngeal(NP) swabs in vial transport medium  Result Value Ref Range Status   SARS Coronavirus 2 by RT PCR POSITIVE (A) NEGATIVE Final    Comment: RESULT CALLED TO, READ BACK BY AND VERIFIED WITH: RN K MUNETT [id number] [id number] FCP (NOTE) SARS-CoV-2 target nucleic acids are DETECTED.  The SARS-CoV-2 RNA is generally detectable in upper respiratory specimens during the acute phase of infection. Positive results are indicative of the presence of the identified virus, but do not rule out bacterial infection or co-infection with other pathogens not detected by the test. Clinical correlation with patient history and other diagnostic information is necessary to determine patient infection status. The expected result is Negative.  Fact Sheet for Patients: EntrepreneurPulse.com.au  Fact Sheet for Healthcare Providers: IncredibleEmployment.be  This test is not yet approved or cleared by the [place] FDA and  has been authorized for detection and/or diagnosis of SARS-CoV-2 by FDA under an Emergency Use Authorization (EUA).  This EUA will remain in effect (meaning this test can be used)   for the duration of  the COVID-19 declaration under Section 564(b)(1) of the Act, 21 U.S.C. section 360bbb-3(b)(1), unless the authorization is terminated or revoked sooner.     Influenza A by PCR NEGATIVE NEGATIVE Final   Influenza B by PCR NEGATIVE NEGATIVE Final    Comment: (NOTE) The Xpert Xpress SARS-CoV-2/FLU/RSV plus assay is intended as an aid in the diagnosis of influenza from Nasopharyngeal swab specimens and should not be used as a sole basis for treatment. Nasal washings and aspirates are unacceptable for Xpert  Xpress SARS-CoV-2/FLU/RSV testing.  Fact Sheet for Patients: EntrepreneurPulse.com.au  Fact Sheet for Healthcare Providers: IncredibleEmployment.be  This test is not yet approved or cleared by the [place] FDA and has been authorized for detection and/or diagnosis of SARS-CoV-2 by FDA under an Emergency Use Authorization (EUA). This EUA will remain in effect (meaning this test can be used) for the duration of the COVID-19 declaration under Section 564(b)(1) of the Act, 21 U.S.C. section 360bbb-3(b)(1), unless the authorization is terminated or revoked.  Performed at [facility], [place] [address], [place], [place] [address]      Labs:  CBC: Recent Labs  Lab 02/12/21 1850 02/13/21 1034 02/14/21 0136  WBC 7.6 7.3 6.3  NEUTROABS 5.8  --   --   HGB 6.8* 11.0* 10.3*  HCT 23.3* 35.0* 32.3*  MCV 92.1 91.4 89.7  PLT 380 352 321   BMP &GFR Recent Labs  Lab 02/12/21 1850 02/13/21 1034 02/13/21 1803 02/14/21 0136  NA 129* 134* 127* 133*  K 4.1 3.8 3.5 3.5  CL 93* 95* 92* 96*  CO2 28 32 24 27  GLUCOSE 207* 179* 128* 163*  BUN 17 13 13 12   CREATININE 1.08* 0.94 1.02* 1.10*  CALCIUM 8.8* 9.3 8.7* 8.9  MG  --  1.5*  --  1.9  PHOS  --  3.3 3.2 3.2   Estimated Creatinine Clearance: 34.7 mL/min (A) (by C-G formula based on SCr of 1.1 mg/dL (H)). Liver & Pancreas: Recent Labs  Lab 02/12/21 1850  02/13/21 1803 02/14/21 0136  AST 15  --   --   ALT 10  --   --   ALKPHOS 65  --   --   BILITOT 0.4  --   --   PROT 5.1*  --   --   ALBUMIN 2.0* 2.1* 2.0*   Recent Labs  Lab 02/12/21 1850  LIPASE 27   No results for input(s): AMMONIA in the last 168 hours. Diabetic: Recent Labs    02/13/21 1035  HGBA1C 7.4*   Recent Labs  Lab 02/13/21 1222 02/13/21 1759 02/13/21 2255 02/14/21 0819 02/14/21 1138  GLUCAP 152* 142* 187* 167* 162*   Cardiac Enzymes: No results for input(s): CKTOTAL, CKMB, CKMBINDEX, TROPONINI in the last 168 hours. No results for input(s): PROBNP in the last 8760 hours. Coagulation Profile: No results for input(s): INR, PROTIME in the last 168 hours. Thyroid Function Tests: No results for input(s): TSH, T4TOTAL, FREET4, T3FREE, THYROIDAB in the last 72 hours. Lipid Profile: No results for input(s): CHOL, HDL, LDLCALC, TRIG, CHOLHDL, LDLDIRECT in the last 72 hours. Anemia Panel: No results for input(s): VITAMINB12, FOLATE, FERRITIN, TIBC, IRON, RETICCTPCT in the last 72 hours. Urine analysis:    Component Value Date/Time   COLORURINE YELLOW 02/13/2021 0610   APPEARANCEUR CLOUDY (A) 02/13/2021 0610   LABSPEC 1.015 02/13/2021 0610   PHURINE 5.0 02/13/2021 0610   GLUCOSEU 50 (A) 02/13/2021 0610   HGBUR NEGATIVE 02/13/2021 0610   BILIRUBINUR NEGATIVE 02/13/2021 0610   KETONESUR NEGATIVE 02/13/2021 0610   PROTEINUR 30 (A) 02/13/2021 0610   UROBILINOGEN 0.2 11/25/2009 1109   NITRITE NEGATIVE 02/13/2021 0610   LEUKOCYTESUR NEGATIVE 02/13/2021 0610   Sepsis Labs: Invalid input(s): PROCALCITONIN, LACTICIDVEN   Time coordinating discharge: 40 minutes  SIGNED:  Mercy Riding, MD  [facility] 02/14/2021, 10:42 PM  If 7PM-7AM, please contact night-coverage [email or web address]

## 2021-02-15 DIAGNOSIS — J948 Other specified pleural conditions: Secondary | ICD-10-CM | POA: Diagnosis not present

## 2021-02-16 ENCOUNTER — Other Ambulatory Visit: Payer: Self-pay

## 2021-02-16 ENCOUNTER — Ambulatory Visit (HOSPITAL_COMMUNITY): Payer: Medicare Other

## 2021-02-16 ENCOUNTER — Ambulatory Visit (HOSPITAL_COMMUNITY): Payer: Medicare Other | Admitting: Certified Registered Nurse Anesthetist

## 2021-02-16 ENCOUNTER — Ambulatory Visit (HOSPITAL_COMMUNITY)
Admission: RE | Admit: 2021-02-16 | Discharge: 2021-02-16 | Disposition: A | Discharge status: Home / Self Care | Payer: Medicare Other | Attending: Emergency Medicine | Admitting: Emergency Medicine

## 2021-02-16 ENCOUNTER — Encounter (HOSPITAL_COMMUNITY): Payer: Self-pay | Admitting: Emergency Medicine

## 2021-02-16 ENCOUNTER — Encounter (HOSPITAL_COMMUNITY)
Admission: RE | Disposition: A | Discharge status: Home / Self Care | Payer: Self-pay | Source: Home / Self Care | Attending: Emergency Medicine

## 2021-02-16 DIAGNOSIS — R918 Other nonspecific abnormal finding of lung field: Secondary | ICD-10-CM | POA: Diagnosis not present

## 2021-02-16 DIAGNOSIS — E782 Mixed hyperlipidemia: Secondary | ICD-10-CM | POA: Diagnosis not present

## 2021-02-16 DIAGNOSIS — F0390 Unspecified dementia without behavioral disturbance: Secondary | ICD-10-CM | POA: Diagnosis not present

## 2021-02-16 DIAGNOSIS — J449 Chronic obstructive pulmonary disease, unspecified: Secondary | ICD-10-CM | POA: Insufficient documentation

## 2021-02-16 DIAGNOSIS — Z923 Personal history of irradiation: Secondary | ICD-10-CM | POA: Diagnosis not present

## 2021-02-16 DIAGNOSIS — I509 Heart failure, unspecified: Secondary | ICD-10-CM

## 2021-02-16 DIAGNOSIS — Z8616 Personal history of COVID-19: Secondary | ICD-10-CM | POA: Diagnosis not present

## 2021-02-16 DIAGNOSIS — E119 Type 2 diabetes mellitus without complications: Secondary | ICD-10-CM | POA: Diagnosis not present

## 2021-02-16 DIAGNOSIS — I10 Essential (primary) hypertension: Secondary | ICD-10-CM | POA: Diagnosis not present

## 2021-02-16 DIAGNOSIS — C50912 Malignant neoplasm of unspecified site of left female breast: Secondary | ICD-10-CM | POA: Insufficient documentation

## 2021-02-16 DIAGNOSIS — Z79899 Other long term (current) drug therapy: Secondary | ICD-10-CM | POA: Insufficient documentation

## 2021-02-16 DIAGNOSIS — C342 Malignant neoplasm of middle lobe, bronchus or lung: Secondary | ICD-10-CM | POA: Diagnosis not present

## 2021-02-16 DIAGNOSIS — E876 Hypokalemia: Secondary | ICD-10-CM | POA: Diagnosis not present

## 2021-02-16 DIAGNOSIS — Z419 Encounter for procedure for purposes other than remedying health state, unspecified: Secondary | ICD-10-CM

## 2021-02-16 DIAGNOSIS — J9601 Acute respiratory failure with hypoxia: Secondary | ICD-10-CM | POA: Diagnosis not present

## 2021-02-16 DIAGNOSIS — F17210 Nicotine dependence, cigarettes, uncomplicated: Secondary | ICD-10-CM | POA: Diagnosis not present

## 2021-02-16 HISTORY — DX: Unspecified dementia, unspecified severity, without behavioral disturbance, psychotic disturbance, mood disturbance, and anxiety: F03.90

## 2021-02-16 HISTORY — PX: BRONCHIAL BIOPSY: SHX5109

## 2021-02-16 HISTORY — PX: VIDEO BRONCHOSCOPY WITH ENDOBRONCHIAL NAVIGATION: SHX6175

## 2021-02-16 HISTORY — PX: BRONCHIAL NEEDLE ASPIRATION BIOPSY: SHX5106

## 2021-02-16 HISTORY — PX: BRONCHIAL BRUSHINGS: SHX5108

## 2021-02-16 LAB — GLUCOSE, CAPILLARY
Glucose-Capillary: 181 mg/dL — ABNORMAL HIGH (ref 70–99)
Glucose-Capillary: 179 mg/dL — ABNORMAL HIGH (ref 70–99)

## 2021-02-16 SURGERY — VIDEO BRONCHOSCOPY WITH ENDOBRONCHIAL NAVIGATION
Anesthesia: General | Laterality: Right

## 2021-02-16 MED ORDER — PENTOXIFYLLINE ER 400 MG PO TBCR: 400.0000 mg | EXTENDED_RELEASE_TABLET | Freq: Three times a day (TID) | ORAL | 0 refills | Status: DC

## 2021-02-16 MED ORDER — CHLORHEXIDINE GLUCONATE 0.12 % MT SOLN: 15.0000 mL | Freq: Once | OROMUCOSAL | Status: AC

## 2021-02-16 MED ORDER — ROCURONIUM BROMIDE 10 MG/ML (PF) SYRINGE
PREFILLED_SYRINGE | INTRAVENOUS | Status: DC | PRN
  Administered 2021-02-16: 40 mg via INTRAVENOUS

## 2021-02-16 MED ORDER — FENTANYL CITRATE (PF) 100 MCG/2ML IJ SOLN: 25.0000 ug | INTRAMUSCULAR | Status: DC | PRN

## 2021-02-16 MED ORDER — CHLORHEXIDINE GLUCONATE 0.12 % MT SOLN
OROMUCOSAL | Status: AC
  Administered 2021-02-16: 15 mL via OROMUCOSAL
  Filled 2021-02-16: qty 15

## 2021-02-16 MED ORDER — SUGAMMADEX SODIUM 200 MG/2ML IV SOLN
INTRAVENOUS | Status: DC | PRN
  Administered 2021-02-16: 200 mg via INTRAVENOUS

## 2021-02-16 MED ORDER — PHENYLEPHRINE 40 MCG/ML (10ML) SYRINGE FOR IV PUSH (FOR BLOOD PRESSURE SUPPORT)
PREFILLED_SYRINGE | INTRAVENOUS | Status: DC | PRN
  Administered 2021-02-16: 80 ug via INTRAVENOUS
  Administered 2021-02-16: 120 ug via INTRAVENOUS

## 2021-02-16 MED ORDER — FENTANYL CITRATE (PF) 250 MCG/5ML IJ SOLN
INTRAMUSCULAR | Status: DC | PRN
  Administered 2021-02-16: 25 ug via INTRAVENOUS

## 2021-02-16 MED ORDER — LACTATED RINGERS IV SOLN: INTRAVENOUS | Status: DC

## 2021-02-16 MED ORDER — ONDANSETRON HCL 4 MG/2ML IJ SOLN: 4.0000 mg | Freq: Once | INTRAMUSCULAR | Status: DC | PRN

## 2021-02-16 MED ORDER — LIDOCAINE 2% (20 MG/ML) 5 ML SYRINGE
INTRAMUSCULAR | Status: DC | PRN
  Administered 2021-02-16: 40 mg via INTRAVENOUS

## 2021-02-16 MED ORDER — FENTANYL CITRATE (PF) 100 MCG/2ML IJ SOLN
INTRAMUSCULAR | Status: AC
  Filled 2021-02-16: qty 2

## 2021-02-16 MED ORDER — PROPOFOL 10 MG/ML IV BOLUS
INTRAVENOUS | Status: DC | PRN
  Administered 2021-02-16: 90 mg via INTRAVENOUS

## 2021-02-16 MED ORDER — ONDANSETRON HCL 4 MG/2ML IJ SOLN
INTRAMUSCULAR | Status: DC | PRN
  Administered 2021-02-16: 4 mg via INTRAVENOUS

## 2021-02-16 NOTE — Anesthesia Postprocedure Evaluation (Signed)
Anesthesia Post Note  Patient: Dawn [name]  Procedure(s) Performed: VIDEO BRONCHOSCOPY WITH ENDOBRONCHIAL NAVIGATION (Right ) BRONCHIAL BIOPSIES BRONCHIAL BRUSHINGS BRONCHIAL NEEDLE ASPIRATION BIOPSIES     Patient location during evaluation: Cath Lab Anesthesia Type: General Level of consciousness: awake and alert Pain management: pain level controlled Vital Signs Assessment: post-procedure vital signs reviewed and stable Respiratory status: spontaneous breathing, nonlabored ventilation, respiratory function stable and patient connected to nasal cannula oxygen Cardiovascular status: blood pressure returned to baseline and stable Postop Assessment: no apparent nausea or vomiting Anesthetic complications: no   No complications documented.  Last Vitals:  Vitals:   02/16/21 1240 02/16/21 1255  BP:  105/86  Pulse: 82 77  Resp: (!) 24 (!) 22  Temp:  36.5 C  SpO2: 96% 92%    Last Pain:  Vitals:   02/16/21 1255  TempSrc:   PainSc: 3                  Haywood Meinders [name]

## 2021-02-16 NOTE — Discharge Instructions (Signed)
Flexible Bronchoscopy, Care After This sheet gives you information about how to care for yourself after your test. Your doctor may also give you more specific instructions. If you have problems or questions, contact your doctor. Follow these instructions at home: Eating and drinking  Do not eat or drink anything (not even water) for 2 hours after your test, or until your numbing medicine (local anesthetic) wears off.  When your numbness is gone and your cough and gag reflexes have come back, you may: ? Eat only soft foods. ? Slowly drink liquids.  The day after the test, go back to your normal diet. Driving  Do not drive for 24 hours if you were given a medicine to help you relax (sedative).  Do not drive or use heavy machinery while taking prescription pain medicine. General instructions   Take over-the-counter and prescription medicines only as told by your doctor.  Return to your normal activities as told. Ask what activities are safe for you.  Do not use any products that have nicotine or tobacco in them. This includes cigarettes and e-cigarettes. If you need help quitting, ask your doctor.  Keep all follow-up visits as told by your doctor. This is important. It is very important if you had a tissue sample (biopsy) taken. Get help right away if:  You have shortness of breath that gets worse.  You get light-headed.  You feel like you are going to pass out (faint).  You have chest pain.  You cough up: ? More than a little blood. ? More blood than before. Summary  Do not eat or drink anything (not even water) for 2 hours after your test, or until your numbing medicine wears off.  Do not use cigarettes. Do not use e-cigarettes.  Get help right away if you have chest pain.  Please call our office for any questions or concerns.  [phone].   This information is not intended to replace advice given to you by your health care provider. Make sure you discuss any  questions you have with your health care provider. Document Released: 06/27/2009 Document Revised: 08/12/2017 Document Reviewed: 09/17/2016 Elsevier Patient Education  2020 [employer].

## 2021-02-16 NOTE — Anesthesia Preprocedure Evaluation (Signed)
Anesthesia Evaluation  Patient identified by MRN, date of birth, ID band Patient confused    Reviewed: Allergy & Precautions, NPO status , Patient's Chart, lab work & pertinent test results  Airway Mallampati: II  TM Distance: >3 FB     Dental  (+) Edentulous Upper, Edentulous Lower   Pulmonary former smoker,    breath sounds clear to auscultation       Cardiovascular hypertension,  Rhythm:Regular Rate:Normal     Neuro/Psych    GI/Hepatic   Endo/Other  diabetes  Renal/GU      Musculoskeletal   Abdominal   Peds  Hematology   Anesthesia Other Findings   Reproductive/Obstetrics                             Anesthesia Physical Anesthesia Plan  ASA: III  Anesthesia Plan: General   Post-op Pain Management:    Induction: Intravenous  PONV Risk Score and Plan: Ondansetron and Dexamethasone  Airway Management Planned: Oral ETT  Additional Equipment:   Intra-op Plan:   Post-operative Plan: Extubation in OR  Informed Consent: I have reviewed the patients History and Physical, chart, labs and discussed the procedure including the risks, benefits and alternatives for the proposed anesthesia with the patient or authorized representative who has indicated his/her understanding and acceptance.       Plan Discussed with: CRNA and Anesthesiologist  Anesthesia Plan Comments:         Anesthesia Quick Evaluation

## 2021-02-16 NOTE — Transfer of Care (Signed)
Immediate Anesthesia Transfer of Care Note  Patient: Dawn [name]  Procedure(s) Performed: VIDEO BRONCHOSCOPY WITH ENDOBRONCHIAL NAVIGATION (Right ) BRONCHIAL BIOPSIES BRONCHIAL BRUSHINGS BRONCHIAL NEEDLE ASPIRATION BIOPSIES  Patient Location: PACU  Anesthesia Type:General  Level of Consciousness: awake and patient cooperative  Airway & Oxygen Therapy: Patient Spontanous Breathing and Patient connected to face mask oxygen  Post-op Assessment: Report given to RN and Post -op Vital signs reviewed and stable  Post vital signs: Reviewed and stable  Last Vitals:  Vitals Value Taken Time  BP 154/65 02/16/21 1222  Temp    Pulse 89 02/16/21 1225  Resp 21 02/16/21 1225  SpO2 96 % 02/16/21 1225  Vitals shown include unvalidated device data.  Last Pain:  Vitals:   02/16/21 0915  TempSrc:   PainSc: 0-No pain         Complications: No complications documented.

## 2021-02-16 NOTE — Anesthesia Procedure Notes (Signed)
Procedure Name: Intubation Date/Time: 02/16/2021 11:31 AM Performed by: Inda Coke, CRNA Pre-anesthesia Checklist: Patient identified, Emergency Drugs available, Suction available and Patient being monitored Patient Re-evaluated:Patient Re-evaluated prior to induction Oxygen Delivery Method: Circle System Utilized Preoxygenation: Pre-oxygenation with 100% oxygen Induction Type: IV induction Ventilation: Mask ventilation without difficulty and Oral airway inserted - appropriate to patient size Laryngoscope Size: Mac and 3 Grade View: Grade I Tube type: Oral Tube size: 8.5 mm Number of attempts: 1 Airway Equipment and Method: Stylet and Oral airway Placement Confirmation: ETT inserted through vocal cords under direct vision,  positive ETCO2 and breath sounds checked- equal and bilateral Secured at: 21 cm Tube secured with: Tape Dental Injury: Teeth and Oropharynx as per pre-operative assessment

## 2021-02-16 NOTE — Interval H&P Note (Signed)
History and Physical Interval Note:  02/16/2021 10:03 AM  Dawn [name]  has presented today for surgery, with the diagnosis of RIGHT LUNG MASS.  The various methods of treatment have been discussed with the patient and family. After consideration of risks, benefits and other options for treatment, the patient has consented to  Procedure(s): [place] (Right) as a surgical intervention.  The patient's history has been reviewed, patient examined. She was admitted over the weekend with symptomatic anemia, received PRBC x 2 with appropriate response. Also she underwent a R thora that showed a transudate. Pleural fluid cytology pending. Examined today, is stable for surgery.  I have reviewed the patient's chart and labs.  Questions were answered to the patient's satisfaction.     Collene Gobble

## 2021-02-16 NOTE — Progress Notes (Signed)
[facility] OFFICE PROGRESS NOTE  Dawn Cruel, MD [place] [address]  DIAGNOSIS: Stage IIIb (T3, N2, MX) non-small cell lung cancer,  squamous cell carcinoma. She presented with large right middle lobe lung mass in addition to subcarinal lymphadenopathy.  She was diagnosed in May 2022  PDL1 requested on 02/19/21  PRIOR THERAPY: None  CURRENT THERAPY: Concurrent chemoradiation with carboplatin for an AUC of 2 and paclitaxel 45 mg per metered squared first dose expected on 03/02/2021  INTERVAL HISTORY: Dawn [name] 80 y.o. female returns to clinic today for a follow-up visit accompanied by her son.  The patient was recently diagnosed with lung cancer.  She underwent a video bronchoscopy under the care of Dr. Lamonte Sakai on 02/17/2020.  The final pathology was consistent with squamous cell carcinoma.    The patient was last seen in the clinic on 01/29/2021.  In the interval since her last appointment, the patient was hospitalized on 02/12/2021-02/14/2021 for the chief complaint of hypotension and anemia.  The patient was given 2 units of RBCs.  She also had decreased oxygen saturation on presentation at 88%.  She had a CT scan which showed a moderate pleural effusion which obtained 500 cc of fluid. The fluid was negative for malignant cells. The patient currently is living with her son.   Overall, the patient is deconditioned and per chart review was to have completed physical and occupational therapy.  She has some chest discomfort which she takes tramadol 1x per day. She does not have pain today.  She has some cognitive deficits at baseline. She denied shortness of breath worse than her baseline. She declined cough today.  She denies any nausea, vomiting, or diarrhea. She has constipation at baseline. Her appetite has improved.  She denies any headache or visual changes.  The patient is here today for evaluation and more detailed discussion about her current condition and  recommended treatment options.   MEDICAL HISTORY: Past Medical History:  Diagnosis Date   Anemia    Anxiety    Arthritis    Breast cancer of upper-outer quadrant of left female breast ([place]) 08/18/2015   Broken wrist    when younger x2   COPD (chronic obstructive pulmonary disease) ([place])    Hospitalization March 2011   COVID-19 01/22/2021   Dementia ([place])    Depression    Diabetes mellitus without complication ([place])    type 2   Dyspnea    Full dentures    H/O vaginal delivery 1962   x1    High cholesterol    History of home oxygen therapy    Hypertension    Memory changes    sharp decline over the last 3 months, per son as of 01/22/21,    Pneumonia 01/22/2021   Radiation    11/18/15- 12/16/15 to her Left Breast    ALLERGIES:  is allergic to ferumoxytol, pneumococcal vaccines, and repaglinide.  MEDICATIONS:  Current Outpatient Medications  Medication Sig Dispense Refill   acetaminophen (TYLENOL) 500 MG tablet Take 1 tablet (500 mg total) by mouth every 8 (eight) hours. 60 tablet    albuterol (VENTOLIN HFA) 108 (90 Base) MCG/ACT inhaler Inhale 2 puffs into the lungs every 2 (two) hours as needed for wheezing or shortness of breath. Please instruct in usage 1 each 2   anastrozole (ARIMIDEX) 1 MG tablet Take 1 tablet (1 mg total) by mouth daily. 30 tablet 0   aspirin EC 81 MG tablet Take 81  mg by mouth daily.     Cholecalciferol (VITAMIN D3) 125 MCG (5000 UT) CAPS 1 tablet     ferrous sulfate 325 (65 FE) MG tablet Take 325 mg by mouth 2 (two) times daily.     metFORMIN (GLUCOPHAGE-XR) 500 MG 24 hr tablet 1 tablet     nicotine (NICODERM CQ - DOSED IN MG/24 HOURS) 14 mg/24hr patch Place 1 patch (14 mg total) onto the skin daily. 28 patch 0   nitroGLYCERIN (NITROSTAT) 0.4 MG SL tablet Place 0.4 mg under the tongue every 15 (fifteen) minutes as needed for chest pain.     pantoprazole (PROTONIX) 40 MG tablet Take 1 tablet (40 mg total) by mouth daily before breakfast. 30 tablet 0    pentoxifylline (TRENTAL) 400 MG CR tablet 1 tablet with meals     pioglitazone (ACTOS) 15 MG tablet Take 1 tablet (15 mg total) by mouth daily. 30 tablet 1   prochlorperazine (COMPAZINE) 10 MG tablet Take 1 tablet (10 mg total) by mouth every 6 (six) hours as needed. 30 tablet 2   rosuvastatin (CRESTOR) 40 MG tablet Take 1 tablet (40 mg total) by mouth daily. 30 tablet 0   Tiotropium Bromide-Olodaterol (STIOLTO RESPIMAT) 2.5-2.5 MCG/ACT AERS Inhale 2 puffs into the lungs daily. 4 g 0   traMADol (ULTRAM) 50 MG tablet 1 tablet as needed     No current facility-administered medications for this visit.    SURGICAL HISTORY:  Past Surgical History:  Procedure Laterality Date   ABDOMINAL HYSTERECTOMY     APPENDECTOMY     BRONCHIAL BIOPSY  02/16/2021   Procedure: BRONCHIAL BIOPSIES;  Surgeon: Collene Gobble, MD;  Location: [facility];  Service: Pulmonary;;   BRONCHIAL BRUSHINGS  02/16/2021   Procedure: BRONCHIAL BRUSHINGS;  Surgeon: Collene Gobble, MD;  Location: [facility];  Service: Pulmonary;;   BRONCHIAL NEEDLE ASPIRATION BIOPSY  02/16/2021   Procedure: BRONCHIAL NEEDLE ASPIRATION BIOPSIES;  Surgeon: Collene Gobble, MD;  Location: [facility];  Service: Pulmonary;;   CHOLECYSTECTOMY     EYE SURGERY     cataracts   RADIOACTIVE SEED GUIDED PARTIAL MASTECTOMY WITH AXILLARY SENTINEL LYMPH NODE BIOPSY Left 09/17/2015   Procedure: RADIOACTIVE SEED LOCALIZATION LEFT BREAST LUMPECTOMY AND LEFT AXILLARY SENTINEL LYMPH NODE BIOPSY;  Surgeon: Excell Seltzer, MD;  Location: [facility];  Service: General;  Laterality: Left;   TONSILLECTOMY     VIDEO BRONCHOSCOPY WITH ENDOBRONCHIAL NAVIGATION Right 02/16/2021   Procedure: VIDEO BRONCHOSCOPY WITH ENDOBRONCHIAL NAVIGATION;  Surgeon: Collene Gobble, MD;  Location: [facility];  Service: Pulmonary;  Laterality: Right;    REVIEW OF SYSTEMS:   Review of Systems  Constitutional: Positive for fatigue and generalized weakness . Negative for appetite change,  chills,  fever and unexpected weight change.  HENT: The patient is hard of hearing.  Negative for mouth sores, nosebleeds, sore throat and trouble swallowing.   Eyes: Negative for eye problems and icterus.  Respiratory: Positive for baseline dyspnea on exertion.  Negative for cough, hemoptysis, and wheezing.   Cardiovascular: Negative for chest pain and leg swelling.  Gastrointestinal: Positive for constipation.  Negative for abdominal pain, diarrhea, nausea and vomiting.  Genitourinary: Negative for bladder incontinence, difficulty urinating, dysuria, frequency and hematuria.   Musculoskeletal: Negative for back pain, gait problem, neck pain and neck stiffness.  Skin: Negative for itching and rash.  Neurological: Negative for dizziness, extremity weakness, gait problem, headaches, light-headedness and seizures.  Hematological: Negative for adenopathy. Does not bruise/bleed easily.  Psychiatric/Behavioral: Positive for memory  deficits.  Negative for confusion, depression and sleep disturbance. The patient is not nervous/anxious.     PHYSICAL EXAMINATION:  Blood pressure 96/67, pulse (!) 109, temperature (!) 97.2 F (36.2 C), temperature source Tympanic, height '5\' 7"'  (1.702 m), weight 117 lb 4.8 oz (53.2 kg), SpO2 90 %.  ECOG PERFORMANCE STATUS: 2-3  Physical Exam  Constitutional: Oriented to person, place, and time and chronically ill-appearing female and in no distress. HENT:  Head: Normocephalic and atraumatic.  Mouth/Throat: Oropharynx is clear and moist. No oropharyngeal exudate.  Eyes: Conjunctivae are normal. Right eye exhibits no discharge. Left eye exhibits no discharge. No scleral icterus.  Neck: Normal range of motion. Neck supple.  Cardiovascular: Normal rate, regular rhythm, normal heart sounds and intact distal pulses.   Pulmonary/Chest: Effort normal.  Quiet breath sounds bilaterally. No respiratory distress. No wheezes. No rales.  Abdominal: Soft. Bowel sounds are normal.  Exhibits no distension and no mass. There is no tenderness.  Musculoskeletal: Normal range of motion. Exhibits no edema.  Lymphadenopathy:    No cervical adenopathy.  Neurological: Alert and oriented to person, place, and time. Exhibits muscle wasting .tone.  Ambulates with a walker. Skin: Skin is warm and dry. No rash noted. Not diaphoretic. No erythema. No pallor.  Psychiatric: Mood, memory and judgment normal.  Vitals reviewed.  LABORATORY DATA: Lab Results  Component Value Date   WBC 6.3 02/19/2021   HGB 10.9 (L) 02/19/2021   HCT 34.6 (L) 02/19/2021   MCV 91.1 02/19/2021   PLT 477 (H) 02/19/2021      Chemistry      Component Value Date/Time   NA 134 (L) 02/19/2021 0950   NA 137 08/20/2015 1232   K 4.0 02/19/2021 0950   K 4.3 08/20/2015 1232   CL 95 (L) 02/19/2021 0950   CO2 30 02/19/2021 0950   CO2 25 08/20/2015 1232   BUN 13 02/19/2021 0950   BUN 10.4 08/20/2015 1232   CREATININE 0.86 02/19/2021 0950   CREATININE 1.1 08/20/2015 1232      Component Value Date/Time   CALCIUM 9.6 02/19/2021 0950   CALCIUM 10.0 08/20/2015 1232   ALKPHOS 88 02/19/2021 0950   ALKPHOS 142 08/20/2015 1232   AST 15 02/19/2021 0950   AST 18 08/20/2015 1232   ALT 9 02/19/2021 0950   ALT 11 08/20/2015 1232   BILITOT 0.3 02/19/2021 0950   BILITOT 0.32 08/20/2015 1232       RADIOGRAPHIC STUDIES:  DG Chest 1 View  Result Date: 02/14/2021 CLINICAL DATA:  Status post thoracentesis EXAM: CHEST  1 VIEW COMPARISON:  02/12/2021 FINDINGS: Interval decrease in volume of a right pleural effusion, with improved aeration of the right lung. There is a redemonstrated large mass of the right middle lobe. The left lung is normally aerated. No pneumothorax. IMPRESSION: 1. Interval decrease in volume of a right pleural effusion, with improved aeration of the right lung. No pneumothorax. 2. There is a redemonstrated large mass of the right middle lobe. Electronically Signed   By: Eddie Candle M.D.   On:  02/14/2021 09:48   DG Chest 2 View  Result Date: 02/12/2021 CLINICAL DATA:  Generalized weakness, chest and epigastric pain, anemia, lung cancer EXAM: CHEST - 2 VIEW COMPARISON:  01/16/2021 FINDINGS: Frontal and lateral views of the chest demonstrate an unremarkable cardiac silhouette. There is persistent consolidation within the right middle lobe compatible with known lung cancer. Small bilateral pleural effusions, right greater than left. The left chest is clear. No pneumothorax. IMPRESSION:  1. Persistent right middle lobe mass consistent with known lung cancer. 2. Small bilateral pleural effusions, right greater than left. Electronically Signed   By: Randa Ngo M.D.   On: 02/12/2021 23:34   CT ANGIO CHEST PE W OR WO CONTRAST  Result Date: 02/13/2021 CLINICAL DATA:  History of breast cancer.  Elevated D-dimer. EXAM: CT ANGIOGRAPHY CHEST WITH CONTRAST TECHNIQUE: Multidetector CT imaging of the chest was performed using the standard protocol during bolus administration of intravenous contrast. Multiplanar CT image reconstructions and MIPs were obtained to evaluate the vascular anatomy. CONTRAST:  60m OMNIPAQUE IOHEXOL 350 MG/ML SOLN COMPARISON:  01/02/2021 FINDINGS: Cardiovascular: No filling defects in the pulmonary arteries to suggest pulmonary emboli. Aortic atherosclerosis. Scattered coronary artery calcifications. Heart is normal size. Aorta normal caliber. Mediastinum/Nodes: Prominent mediastinal lymph nodes. 12 mm precarinal lymph node. Similar sized AP window lymph node. No axillary adenopathy. Lungs/Pleura: Central mass in the right middle lobe is again noted, difficult to measure precisely given peripheral postobstructive atelectasis or infiltrate, but measures approximately 6 x 3.8 cm. Moderate right pleural effusion. Compressive atelectasis in the right lower lobe. Left lung clear. Upper Abdomen: Calcifications in the spleen compatible with old granulomatous disease. No acute findings.  Musculoskeletal: Mild compression fracture through the superior endplate of TM38 new since prior study. Review of the MIP images confirms the above findings. IMPRESSION: No evidence of pulmonary embolus. Large central right middle lobe mass again noted, difficult to measure due to adjacent postobstructive atelectasis or infiltrate. Moderate right pleural effusion. New mild compression fracture through the superior endplate of TG66 Coronary artery disease. Aortic Atherosclerosis (ICD10-I70.0). Electronically Signed   By: KRolm BaptiseM.D.   On: 02/13/2021 15:17   DG Chest Port 1 View  Result Date: 02/16/2021 CLINICAL DATA:  Status post bronchoscopy. EXAM: PORTABLE CHEST 1 VIEW COMPARISON:  February 14, 2021. FINDINGS: The heart size and mediastinal contours are within normal limits. No pneumothorax is noted. Left lung is clear. Increased right basilar opacity is noted concerning for increased atelectasis or infiltrate with probable associated effusion. The visualized skeletal structures are unremarkable. IMPRESSION: Increased right basilar opacity as described above. Electronically Signed   By: JMarijo ConceptionM.D.   On: 02/16/2021 12:48   [place]ORACENTESIS ASP PLEURAL SPACE W/IMG GUIDE  Result Date: 02/14/2021 INDICATION: Patient with history of RML lung mass, COPD exacerbation, acute hypoxic respiratory failure, and right pleural effusion. Request is made for diagnostic and therapeutic right thoracentesis. EXAM: ULTRASOUND GUIDED DIAGNOSTIC AND THERAPEUTIC RIGHT THORACENTESIS MEDICATIONS: 10 mL 1% lidocaine COMPLICATIONS: None immediate. PROCEDURE: An ultrasound guided thoracentesis was thoroughly discussed with the patient and questions answered. The benefits, risks, alternatives and complications were also discussed. The patient understands and wishes to proceed with the procedure. Written consent was obtained. Ultrasound was performed to localize and mark an adequate pocket of fluid in the right chest. The area  was then prepped and draped in the normal sterile fashion. 1% Lidocaine was used for local anesthesia. Under ultrasound guidance a 6 Fr Safe-T-Centesis catheter was introduced. Thoracentesis was performed. The catheter was removed and a dressing applied. FINDINGS: A total of approximately 500 mL of clear gold fluid was removed. Samples were sent to the laboratory as requested by the clinical team. IMPRESSION: Successful ultrasound guided right thoracentesis yielding 500 mL of pleural fluid. Read by: AEarley Abide PA-C Electronically Signed   By: GAletta EdouardM.D.   On: 02/14/2021 10:02   DG C-ARM BRONCHOSCOPY  Result Date: 02/16/2021 C-ARM BRONCHOSCOPY: Fluoroscopy was  utilized by the requesting physician.  No radiographic interpretation.      ASSESSMENT/PLAN:  This is a very pleasant 80 year old Caucasian female diagnosed with stage IIIb (T3, N2, Mx) non-small cell lung cancer, squamous cell carcinoma.  She presented with a large right middle lobe lung mass in addition to subcarinal lymphadenopathy.  She was diagnosed in May 2022.  PD-L1 testing was requested today.   The patient was seen with Dr. Julien Nordmann today.  Dr. Julien Nordmann had a lengthy discussion the patient about her current condition and recommended treatment options.  Dr. Julien Nordmann gave the patient the option of palliative care/hospice versus starting on concurrent chemoradiation with carboplatin for an [facility] of 2 and paclitaxel 45 mg per metered square. Vs radiation alone  The patient and her son are interested in concurrent chemoradiation and she is expected to receive her first dose of treatment on 03/02/2021.  The adverse side effects of treatment were discussed including but not limited to nausea, vomiting, myelosuppression, alopecia, peripheral neuropathy, liver, and kidney dysfunction.  I will arrange for the patient to have a chemo education class prior to receiving her first cycle of treatment.  I sent a prescription for Compazine 10  mg p.o. every 6 hours as needed for nausea to the patient's pharmacy.  We will see her back for follow-up visit in  3 for evaluation before starting cycle #2.  I will place referral to radiation oncology.  We will walk the patient around to see if she qualifies for supplemental oxygen.  Her oxygen is 90% on room air without exertion.  If her oxygen falls below 88%, we will arrange for oxygen via nasal cannula.  The patient was advised to call immediately if she has any concerning symptoms in the interval. The patient voices understanding of current disease status and treatment options and is in agreement with the current care plan. All questions were answered. The patient knows to call the clinic with any problems, questions or concerns. We can certainly see the patient much sooner if necessary    Orders Placed This Encounter  Procedures   CBC with Differential ([facility] Only)    Standing Status:   Standing    Number of Occurrences:   7    Standing Expiration Date:   02/19/2022   CMP ([facility] only)    Standing Status:   Standing    Number of Occurrences:   7    Standing Expiration Date:   02/19/2022   Ambulatory referral to Radiation Oncology    Referral Priority:   Routine    Referral Type:   Consultation    Referral Reason:   Specialty Services Required    Requested Specialty:   Radiation Oncology    Number of Visits Requested:   [place], PA-C 02/19/21  ADDENDUM: Hematology/Oncology Attending: I had a face-to-face encounter with the patient.  I reviewed her records, lab and scan and recommended her care plan.  This is a very pleasant 80 years old white female with dementia and other medical conditions who was recently diagnosed with stage IIIb (T3, N2, M0) non-small cell lung cancer, squamous cell carcinoma diagnosed in May 2022 and presented with large right middle lobe lung mass in addition to subcarinal lymphadenopathy. I had a lengthy  discussion with the patient and her son today about her current disease stage, prognosis and treatment options. I gave the patient the option of a course of concurrent chemoradiation with weekly carboplatin for  AUC of 2 and paclitaxel 45 Mg/M2 for 6-7 weeks versus just palliative radiotherapy if she is not interested in chemotherapy. The patient and her son are interested in proceeding with the concurrent course of treatment. I discussed with her the adverse effect of this treatment including but not limited to alopecia, myelosuppression, nausea and vomiting, peripheral neuropathy, liver or renal dysfunction. The patient has no evidence for disease progression after the induction treatment, she may benefit from consolidation treatment with immunotherapy. We will refer the patient to radiation oncology for evaluation and discussion of the radiotherapy option. The patient is expected to start the first dose of her treatment on March 02, 2021. She will have a chemotherapy education class before the first dose of treatment. The patient will come back for follow-up visit 1 week after the first dose of her treatment for evaluation and management of any adverse effect of her chemotherapy. She was advised to call immediately if she has any other concerning symptoms in the interval. The total time spent in the appointment was 35 minutes.  Disclaimer: This note was dictated with voice recognition software. Similar sounding words can inadvertently be transcribed and may be missed upon review. Eilleen Kempf, MD 02/20/21

## 2021-02-16 NOTE — Op Note (Signed)
Video Bronchoscopy with Electromagnetic Navigation Procedure Note  Date of Operation: 02/16/2021  Pre-op Diagnosis: Right middle lobe mass  Post-op Diagnosis: Same  Surgeon: Baltazar Apo  Assistants: None  Anesthesia: General endotracheal anesthesia  Operation: Flexible video fiberoptic bronchoscopy with electromagnetic navigation and biopsies.  Estimated Blood Loss: Minimal  Complications: None apparent  Indications and History: Dawn [name] is a 80 y.o. female with history tobacco use, prior breast cancer.  She was found to have a right middle lobe mass on chest imaging.  Recommendation was made to achieve tissue diagnosis via navigational bronchoscopy.  The risks, benefits, complications, treatment options and expected outcomes were discussed with the patient.  The possibilities of pneumothorax, pneumonia, reaction to medication, pulmonary aspiration, perforation of a viscus, bleeding, failure to diagnose a condition and creating a complication requiring transfusion or operation were discussed with the patient who freely signed the consent.    Description of Procedure: The patient was seen in the Preoperative Area, was examined and was deemed appropriate to proceed.  The patient was taken to [facility] endoscopy room 2, identified as Dawn [name] and the procedure verified as Flexible Video Fiberoptic Bronchoscopy.  A Time Out was held and the above information confirmed.   Prior to the date of the procedure a high-resolution CT scan of the chest was performed. Utilizing [place] a virtual tracheobronchial tree was generated to allow the creation of distinct navigation pathways to the patient's parenchymal abnormalities. After being taken to the operating room general anesthesia was initiated and the patient  was orally intubated. The video fiberoptic bronchoscope was introduced via the endotracheal tube and a general inspection was performed which showed normal airways on the  left.  The mucosa of the right middle lobe bronchus was irregular, raised consistent with possible malignancy.  Endobronchial brushings and biopsies were performed at this location to be sent for cytology and pathology.   The extendable working channel and locator guide were introduced into the bronchoscope. The distinct navigation pathways prepared prior to this procedure were then utilized to navigate to within 0.1 cm of patient's right middle lobe mass identified on CT scan. The extendable working channel was secured into place and the locator guide was withdrawn. Under fluoroscopic guidance transbronchial needle brushings, transbronchial Wang needle biopsies, and transbronchial forceps biopsies were performed to be sent for cytology and pathology.  At the end of the procedure a general airway inspection was performed and there was no evidence of active bleeding. The bronchoscope was removed.  The patient tolerated the procedure well. There was no significant blood loss and there were no obvious complications. A post-procedural chest x-ray is pending.  Samples: 1. Transbronchial needle brushings from right lobe mass 2. Transbronchial Wang needle biopsies from right middle lobe mass 3. Transbronchial forceps biopsies from right middle lobe mass 4.  Endobronchial brushings from right middle lobe bronchus 5. Endobronchial biopsies from right middle lobe bronchus  Plans:  The patient will be discharged from the PACU to home when recovered from anesthesia and after chest x-ray is reviewed. We will review the cytology, pathology and microbiology results with the patient when they become available. Outpatient followup will be with Dr. Lamonte Sakai and Dr. Julien Nordmann.    Dawn Heidrich S. 02/16/2021

## 2021-02-17 LAB — CULTURE, BLOOD (ROUTINE X 2)
Culture: NO GROWTH
Culture: NO GROWTH

## 2021-02-17 LAB — BODY FLUID CULTURE W GRAM STAIN: Culture: NO GROWTH

## 2021-02-18 ENCOUNTER — Encounter (HOSPITAL_COMMUNITY): Payer: Self-pay | Admitting: Emergency Medicine

## 2021-02-18 DIAGNOSIS — I10 Essential (primary) hypertension: Secondary | ICD-10-CM | POA: Diagnosis not present

## 2021-02-18 DIAGNOSIS — E876 Hypokalemia: Secondary | ICD-10-CM | POA: Diagnosis not present

## 2021-02-18 DIAGNOSIS — R413 Other amnesia: Secondary | ICD-10-CM | POA: Diagnosis not present

## 2021-02-18 DIAGNOSIS — D381 Neoplasm of uncertain behavior of trachea, bronchus and lung: Secondary | ICD-10-CM | POA: Diagnosis not present

## 2021-02-18 DIAGNOSIS — J449 Chronic obstructive pulmonary disease, unspecified: Secondary | ICD-10-CM | POA: Diagnosis not present

## 2021-02-18 DIAGNOSIS — E785 Hyperlipidemia, unspecified: Secondary | ICD-10-CM | POA: Diagnosis not present

## 2021-02-18 DIAGNOSIS — C50412 Malignant neoplasm of upper-outer quadrant of left female breast: Secondary | ICD-10-CM | POA: Diagnosis not present

## 2021-02-18 DIAGNOSIS — E1169 Type 2 diabetes mellitus with other specified complication: Secondary | ICD-10-CM | POA: Diagnosis not present

## 2021-02-18 DIAGNOSIS — E1165 Type 2 diabetes mellitus with hyperglycemia: Secondary | ICD-10-CM | POA: Diagnosis not present

## 2021-02-18 DIAGNOSIS — D630 Anemia in neoplastic disease: Secondary | ICD-10-CM | POA: Diagnosis not present

## 2021-02-18 DIAGNOSIS — U071 COVID-19: Secondary | ICD-10-CM | POA: Diagnosis not present

## 2021-02-18 DIAGNOSIS — R627 Adult failure to thrive: Secondary | ICD-10-CM | POA: Diagnosis not present

## 2021-02-18 DIAGNOSIS — M1990 Unspecified osteoarthritis, unspecified site: Secondary | ICD-10-CM | POA: Diagnosis not present

## 2021-02-18 LAB — CYTOLOGY - NON PAP

## 2021-02-19 ENCOUNTER — Other Ambulatory Visit: Payer: Self-pay

## 2021-02-19 ENCOUNTER — Inpatient Hospital Stay: Payer: Medicare Other

## 2021-02-19 ENCOUNTER — Other Ambulatory Visit: Payer: Self-pay | Admitting: Internal Medicine

## 2021-02-19 ENCOUNTER — Inpatient Hospital Stay: Payer: Medicare Other | Attending: Internal Medicine | Admitting: Physician Assistant

## 2021-02-19 VITALS — BP 96/67 | HR 109 | Temp 97.2°F | Ht 67.0 in | Wt 117.3 lb

## 2021-02-19 DIAGNOSIS — Z79899 Other long term (current) drug therapy: Secondary | ICD-10-CM | POA: Diagnosis not present

## 2021-02-19 DIAGNOSIS — R0609 Other forms of dyspnea: Secondary | ICD-10-CM | POA: Diagnosis not present

## 2021-02-19 DIAGNOSIS — J90 Pleural effusion, not elsewhere classified: Secondary | ICD-10-CM | POA: Insufficient documentation

## 2021-02-19 DIAGNOSIS — C50411 Malignant neoplasm of upper-outer quadrant of right female breast: Secondary | ICD-10-CM | POA: Diagnosis not present

## 2021-02-19 DIAGNOSIS — Z8616 Personal history of COVID-19: Secondary | ICD-10-CM | POA: Insufficient documentation

## 2021-02-19 DIAGNOSIS — C3491 Malignant neoplasm of unspecified part of right bronchus or lung: Secondary | ICD-10-CM

## 2021-02-19 DIAGNOSIS — R531 Weakness: Secondary | ICD-10-CM | POA: Diagnosis not present

## 2021-02-19 DIAGNOSIS — Z7189 Other specified counseling: Secondary | ICD-10-CM | POA: Diagnosis not present

## 2021-02-19 DIAGNOSIS — L89159 Pressure ulcer of sacral region, unspecified stage: Secondary | ICD-10-CM | POA: Diagnosis not present

## 2021-02-19 DIAGNOSIS — I700 Atherosclerosis of aorta: Secondary | ICD-10-CM | POA: Diagnosis not present

## 2021-02-19 DIAGNOSIS — R7989 Other specified abnormal findings of blood chemistry: Secondary | ICD-10-CM | POA: Diagnosis not present

## 2021-02-19 DIAGNOSIS — R0789 Other chest pain: Secondary | ICD-10-CM | POA: Diagnosis not present

## 2021-02-19 DIAGNOSIS — R0902 Hypoxemia: Secondary | ICD-10-CM

## 2021-02-19 DIAGNOSIS — R5383 Other fatigue: Secondary | ICD-10-CM | POA: Insufficient documentation

## 2021-02-19 DIAGNOSIS — I10 Essential (primary) hypertension: Secondary | ICD-10-CM | POA: Diagnosis not present

## 2021-02-19 DIAGNOSIS — I2510 Atherosclerotic heart disease of native coronary artery without angina pectoris: Secondary | ICD-10-CM | POA: Diagnosis not present

## 2021-02-19 DIAGNOSIS — C770 Secondary and unspecified malignant neoplasm of lymph nodes of head, face and neck: Secondary | ICD-10-CM | POA: Insufficient documentation

## 2021-02-19 DIAGNOSIS — C3492 Malignant neoplasm of unspecified part of left bronchus or lung: Secondary | ICD-10-CM

## 2021-02-19 DIAGNOSIS — R197 Diarrhea, unspecified: Secondary | ICD-10-CM | POA: Diagnosis not present

## 2021-02-19 DIAGNOSIS — Z5111 Encounter for antineoplastic chemotherapy: Secondary | ICD-10-CM | POA: Insufficient documentation

## 2021-02-19 DIAGNOSIS — R634 Abnormal weight loss: Secondary | ICD-10-CM | POA: Diagnosis not present

## 2021-02-19 DIAGNOSIS — Z9049 Acquired absence of other specified parts of digestive tract: Secondary | ICD-10-CM | POA: Diagnosis not present

## 2021-02-19 DIAGNOSIS — C342 Malignant neoplasm of middle lobe, bronchus or lung: Secondary | ICD-10-CM | POA: Insufficient documentation

## 2021-02-19 DIAGNOSIS — J449 Chronic obstructive pulmonary disease, unspecified: Secondary | ICD-10-CM | POA: Diagnosis not present

## 2021-02-19 DIAGNOSIS — Z170 Estrogen receptor positive status [ER+]: Secondary | ICD-10-CM | POA: Diagnosis not present

## 2021-02-19 DIAGNOSIS — E119 Type 2 diabetes mellitus without complications: Secondary | ICD-10-CM | POA: Insufficient documentation

## 2021-02-19 DIAGNOSIS — R918 Other nonspecific abnormal finding of lung field: Secondary | ICD-10-CM

## 2021-02-19 DIAGNOSIS — K5900 Constipation, unspecified: Secondary | ICD-10-CM | POA: Insufficient documentation

## 2021-02-19 LAB — CMP (CANCER CENTER ONLY)
ALT: 9 U/L (ref 0–44)
AST: 15 U/L (ref 15–41)
Albumin: 2.3 g/dL — ABNORMAL LOW (ref 3.5–5.0)
Alkaline Phosphatase: 88 U/L (ref 38–126)
Anion gap: 9 (ref 5–15)
BUN: 13 mg/dL (ref 8–23)
CO2: 30 mmol/L (ref 22–32)
Calcium: 9.6 mg/dL (ref 8.9–10.3)
Chloride: 95 mmol/L — ABNORMAL LOW (ref 98–111)
Creatinine: 0.86 mg/dL (ref 0.44–1.00)
GFR, Estimated: >60 mL/min (ref >60)
Glucose, Bld: 270 mg/dL — ABNORMAL HIGH (ref 70–99)
Potassium: 4 mmol/L (ref 3.5–5.1)
Sodium: 134 mmol/L — ABNORMAL LOW (ref 135–145)
Total Bilirubin: 0.3 mg/dL (ref 0.3–1.2)
Total Protein: 6.2 g/dL — ABNORMAL LOW (ref 6.5–8.1)

## 2021-02-19 LAB — CBC WITH DIFFERENTIAL (CANCER CENTER ONLY)
Abs Immature Granulocytes: 0.04 10*3/uL (ref 0.00–0.07)
Basophils Absolute: 0 10*3/uL (ref 0.0–0.1)
Basophils Relative: 1 %
Eosinophils Absolute: 0.2 10*3/uL (ref 0.0–0.5)
Eosinophils Relative: 3 %
HCT: 34.6 % — ABNORMAL LOW (ref 36.0–46.0)
Hemoglobin: 10.9 g/dL — ABNORMAL LOW (ref 12.0–15.0)
Immature Granulocytes: 1 %
Lymphocytes Relative: 31 %
Lymphs Abs: 2 10*3/uL (ref 0.7–4.0)
MCH: 28.7 pg (ref 26.0–34.0)
MCHC: 31.5 g/dL (ref 30.0–36.0)
MCV: 91.1 fL (ref 80.0–100.0)
Monocytes Absolute: 0.4 10*3/uL (ref 0.1–1.0)
Monocytes Relative: 6 %
Neutro Abs: 3.7 10*3/uL (ref 1.7–7.7)
Neutrophils Relative %: 58 %
Platelet Count: 477 10*3/uL — ABNORMAL HIGH (ref 150–400)
RBC: 3.8 MIL/uL — ABNORMAL LOW (ref 3.87–5.11)
RDW: 16.9 % — ABNORMAL HIGH (ref 11.5–15.5)
WBC Count: 6.3 10*3/uL (ref 4.0–10.5)
nRBC: 0 % (ref 0.0–0.2)

## 2021-02-19 MED ORDER — PROCHLORPERAZINE MALEATE 10 MG PO TABS: 10.0000 mg | ORAL_TABLET | Freq: Four times a day (QID) | ORAL | 2 refills | Status: DC | PRN

## 2021-02-19 NOTE — Patient Instructions (Addendum)
-  There are two main categories of lung cancer, they are named based on the size of the cancer cell. One is called Non-Small cell lung cancer. The other type is Small Cell Lung Cancer -The sample (biopsy) that they took of your tumor was consistent with a subtype of Non-small cell lung cancer called Squamous Cell Carcinoma  -We covered a lot of important information at your appointment today regarding what the treatment plan is moving forward. Here are the the main points that were discussed at your office visit with [place] today:  -The treatment that you will receive consists of two chemotherapy drugs called Carboplatin and Paclitaxel (also called Taxol) -We are planning on starting your treatment next week on 03/02/21 but before your start your treatment, I would like you to attend a Chemotherapy Education Class. This involves having you sit down with one of our nurse educators. She will discuss with you one-on-one more details about your treatment as well as general information about resources here at the [facility] treatment will be given every week for about 6 weeks or so (as long as you are also receiving radiation). We will check your labs (blood work) once a week . Dr. Julien Nordmann or I will see you every other treatment just to make sure that you are doing well and that everything is on track. -We will get a CT scan about 3 weeks after you complete your treatment  Medications:  -Compazine was sent to your pharmacy. This medication is for nausea. You may take this every 6 hours as needed if you feel nausous.   Referrals  Follow Up:  -We will see you back for a follow up visit in 2 weeks with the first treatment

## 2021-02-19 NOTE — Progress Notes (Signed)
This nurse performed walking pulse oximetry with this patient.  Starting oxygen was 81% on room air. While walking fluctuated between 88% and 90% with continuous walking and did not rise above 90%.    Reported results to C. Heilingoetter, PA-C who gave an order for oxygen.  Referral order submitted to [facility] for DME oxygen at 2LPM continuous.

## 2021-02-19 NOTE — Progress Notes (Signed)
START ON PATHWAY REGIMEN - Non-Small Cell Lung     Administer weekly:     Paclitaxel      Carboplatin   **Always confirm dose/schedule in your pharmacy ordering system**  Patient Characteristics: Preoperative or Nonsurgical Candidate (Clinical Staging), Stage II, Nonsurgical Candidate Therapeutic Status: Preoperative or Nonsurgical Candidate (Clinical Staging) AJCC T Category: cT3 AJCC N Category: cN0 AJCC M Category: cM0 AJCC 8 Stage Grouping: IIB Intent of Therapy: Curative Intent, Discussed with Patient

## 2021-02-20 ENCOUNTER — Encounter: Payer: Self-pay | Admitting: Internal Medicine

## 2021-02-20 DIAGNOSIS — I10 Essential (primary) hypertension: Secondary | ICD-10-CM | POA: Diagnosis not present

## 2021-02-20 DIAGNOSIS — J449 Chronic obstructive pulmonary disease, unspecified: Secondary | ICD-10-CM | POA: Diagnosis not present

## 2021-02-20 DIAGNOSIS — C50412 Malignant neoplasm of upper-outer quadrant of left female breast: Secondary | ICD-10-CM | POA: Diagnosis not present

## 2021-02-20 DIAGNOSIS — U071 COVID-19: Secondary | ICD-10-CM | POA: Diagnosis not present

## 2021-02-20 DIAGNOSIS — R413 Other amnesia: Secondary | ICD-10-CM | POA: Diagnosis not present

## 2021-02-20 DIAGNOSIS — E876 Hypokalemia: Secondary | ICD-10-CM | POA: Diagnosis not present

## 2021-02-20 DIAGNOSIS — D381 Neoplasm of uncertain behavior of trachea, bronchus and lung: Secondary | ICD-10-CM | POA: Diagnosis not present

## 2021-02-20 DIAGNOSIS — E1169 Type 2 diabetes mellitus with other specified complication: Secondary | ICD-10-CM | POA: Diagnosis not present

## 2021-02-20 DIAGNOSIS — D630 Anemia in neoplastic disease: Secondary | ICD-10-CM | POA: Diagnosis not present

## 2021-02-20 DIAGNOSIS — R627 Adult failure to thrive: Secondary | ICD-10-CM | POA: Diagnosis not present

## 2021-02-20 DIAGNOSIS — M1990 Unspecified osteoarthritis, unspecified site: Secondary | ICD-10-CM | POA: Diagnosis not present

## 2021-02-20 DIAGNOSIS — E1165 Type 2 diabetes mellitus with hyperglycemia: Secondary | ICD-10-CM | POA: Diagnosis not present

## 2021-02-20 DIAGNOSIS — E785 Hyperlipidemia, unspecified: Secondary | ICD-10-CM | POA: Diagnosis not present

## 2021-02-22 LAB — ACID FAST SMEAR (AFB, MYCOBACTERIA): Acid Fast Smear: NEGATIVE

## 2021-02-23 ENCOUNTER — Telehealth: Payer: Self-pay | Admitting: Medical Oncology

## 2021-02-23 NOTE — Telephone Encounter (Signed)
  Beginning treatment   Radiation can see her July 1st -Same time as PCP appt.  I told him he needs to take radiation appt and to  call her PCP t see what that appt is about.   Per Dr Julien Nordmann I told pt to take the radiation appt on July 1st.

## 2021-02-24 ENCOUNTER — Telehealth: Payer: Self-pay | Admitting: Medical Oncology

## 2021-02-24 DIAGNOSIS — M1990 Unspecified osteoarthritis, unspecified site: Secondary | ICD-10-CM | POA: Diagnosis not present

## 2021-02-24 DIAGNOSIS — E1169 Type 2 diabetes mellitus with other specified complication: Secondary | ICD-10-CM | POA: Diagnosis not present

## 2021-02-24 DIAGNOSIS — I10 Essential (primary) hypertension: Secondary | ICD-10-CM | POA: Diagnosis not present

## 2021-02-24 DIAGNOSIS — U071 COVID-19: Secondary | ICD-10-CM | POA: Diagnosis not present

## 2021-02-24 DIAGNOSIS — C50412 Malignant neoplasm of upper-outer quadrant of left female breast: Secondary | ICD-10-CM | POA: Diagnosis not present

## 2021-02-24 DIAGNOSIS — E1165 Type 2 diabetes mellitus with hyperglycemia: Secondary | ICD-10-CM | POA: Diagnosis not present

## 2021-02-24 DIAGNOSIS — D381 Neoplasm of uncertain behavior of trachea, bronchus and lung: Secondary | ICD-10-CM | POA: Diagnosis not present

## 2021-02-24 DIAGNOSIS — R413 Other amnesia: Secondary | ICD-10-CM | POA: Diagnosis not present

## 2021-02-24 DIAGNOSIS — E876 Hypokalemia: Secondary | ICD-10-CM | POA: Diagnosis not present

## 2021-02-24 DIAGNOSIS — R627 Adult failure to thrive: Secondary | ICD-10-CM | POA: Diagnosis not present

## 2021-02-24 DIAGNOSIS — D630 Anemia in neoplastic disease: Secondary | ICD-10-CM | POA: Diagnosis not present

## 2021-02-24 DIAGNOSIS — J449 Chronic obstructive pulmonary disease, unspecified: Secondary | ICD-10-CM | POA: Diagnosis not present

## 2021-02-24 DIAGNOSIS — E785 Hyperlipidemia, unspecified: Secondary | ICD-10-CM | POA: Diagnosis not present

## 2021-02-24 NOTE — Telephone Encounter (Signed)
Son confirmed July 1 xrt appt. Shay in xrt notified.  If there is a problem please call him .

## 2021-02-25 ENCOUNTER — Telehealth: Payer: Self-pay | Admitting: *Deleted

## 2021-02-25 ENCOUNTER — Encounter: Payer: Self-pay | Admitting: *Deleted

## 2021-02-25 ENCOUNTER — Telehealth: Payer: Self-pay | Admitting: Internal Medicine

## 2021-02-25 NOTE — Telephone Encounter (Signed)
Called and left detailed msg about all upcoming appts. Left call back number. Mailed printout

## 2021-02-25 NOTE — Telephone Encounter (Signed)
Pathology updated me that testing for PDL 1 was sent out on 02/23/21.  Cassie PA-C aware.

## 2021-02-25 NOTE — Progress Notes (Signed)
Per Carie Caddy I notified pathology dept to send recent bx for PDL 1 testing.

## 2021-02-26 ENCOUNTER — Inpatient Hospital Stay: Payer: Medicare Other

## 2021-02-26 ENCOUNTER — Other Ambulatory Visit: Payer: Self-pay

## 2021-02-26 DIAGNOSIS — R0609 Other forms of dyspnea: Secondary | ICD-10-CM | POA: Diagnosis not present

## 2021-02-26 DIAGNOSIS — E785 Hyperlipidemia, unspecified: Secondary | ICD-10-CM | POA: Diagnosis not present

## 2021-02-26 DIAGNOSIS — C342 Malignant neoplasm of middle lobe, bronchus or lung: Secondary | ICD-10-CM | POA: Diagnosis not present

## 2021-02-26 DIAGNOSIS — K5900 Constipation, unspecified: Secondary | ICD-10-CM | POA: Diagnosis not present

## 2021-02-26 DIAGNOSIS — R413 Other amnesia: Secondary | ICD-10-CM | POA: Diagnosis not present

## 2021-02-26 DIAGNOSIS — C50412 Malignant neoplasm of upper-outer quadrant of left female breast: Secondary | ICD-10-CM | POA: Diagnosis not present

## 2021-02-26 DIAGNOSIS — R5383 Other fatigue: Secondary | ICD-10-CM | POA: Diagnosis not present

## 2021-02-26 DIAGNOSIS — C3491 Malignant neoplasm of unspecified part of right bronchus or lung: Secondary | ICD-10-CM

## 2021-02-26 DIAGNOSIS — Z79899 Other long term (current) drug therapy: Secondary | ICD-10-CM | POA: Diagnosis not present

## 2021-02-26 DIAGNOSIS — J449 Chronic obstructive pulmonary disease, unspecified: Secondary | ICD-10-CM | POA: Diagnosis not present

## 2021-02-26 DIAGNOSIS — C770 Secondary and unspecified malignant neoplasm of lymph nodes of head, face and neck: Secondary | ICD-10-CM | POA: Diagnosis not present

## 2021-02-26 DIAGNOSIS — R531 Weakness: Secondary | ICD-10-CM | POA: Diagnosis not present

## 2021-02-26 DIAGNOSIS — R7989 Other specified abnormal findings of blood chemistry: Secondary | ICD-10-CM | POA: Diagnosis not present

## 2021-02-26 DIAGNOSIS — Z8616 Personal history of COVID-19: Secondary | ICD-10-CM | POA: Diagnosis not present

## 2021-02-26 DIAGNOSIS — R627 Adult failure to thrive: Secondary | ICD-10-CM | POA: Diagnosis not present

## 2021-02-26 DIAGNOSIS — R634 Abnormal weight loss: Secondary | ICD-10-CM | POA: Diagnosis not present

## 2021-02-26 DIAGNOSIS — R0789 Other chest pain: Secondary | ICD-10-CM | POA: Diagnosis not present

## 2021-02-26 DIAGNOSIS — E876 Hypokalemia: Secondary | ICD-10-CM | POA: Diagnosis not present

## 2021-02-26 DIAGNOSIS — Z9049 Acquired absence of other specified parts of digestive tract: Secondary | ICD-10-CM | POA: Diagnosis not present

## 2021-02-26 DIAGNOSIS — D630 Anemia in neoplastic disease: Secondary | ICD-10-CM | POA: Diagnosis not present

## 2021-02-26 DIAGNOSIS — Z5111 Encounter for antineoplastic chemotherapy: Secondary | ICD-10-CM | POA: Diagnosis not present

## 2021-02-26 DIAGNOSIS — I10 Essential (primary) hypertension: Secondary | ICD-10-CM | POA: Diagnosis not present

## 2021-02-26 DIAGNOSIS — I2510 Atherosclerotic heart disease of native coronary artery without angina pectoris: Secondary | ICD-10-CM | POA: Diagnosis not present

## 2021-02-26 DIAGNOSIS — J90 Pleural effusion, not elsewhere classified: Secondary | ICD-10-CM | POA: Diagnosis not present

## 2021-02-26 DIAGNOSIS — U071 COVID-19: Secondary | ICD-10-CM | POA: Diagnosis not present

## 2021-02-26 DIAGNOSIS — E1169 Type 2 diabetes mellitus with other specified complication: Secondary | ICD-10-CM | POA: Diagnosis not present

## 2021-02-26 DIAGNOSIS — E119 Type 2 diabetes mellitus without complications: Secondary | ICD-10-CM | POA: Diagnosis not present

## 2021-02-26 DIAGNOSIS — C50411 Malignant neoplasm of upper-outer quadrant of right female breast: Secondary | ICD-10-CM | POA: Diagnosis not present

## 2021-02-26 DIAGNOSIS — I700 Atherosclerosis of aorta: Secondary | ICD-10-CM | POA: Diagnosis not present

## 2021-02-26 DIAGNOSIS — E1165 Type 2 diabetes mellitus with hyperglycemia: Secondary | ICD-10-CM | POA: Diagnosis not present

## 2021-02-26 DIAGNOSIS — D381 Neoplasm of uncertain behavior of trachea, bronchus and lung: Secondary | ICD-10-CM | POA: Diagnosis not present

## 2021-02-26 DIAGNOSIS — M1990 Unspecified osteoarthritis, unspecified site: Secondary | ICD-10-CM | POA: Diagnosis not present

## 2021-02-26 DIAGNOSIS — Z170 Estrogen receptor positive status [ER+]: Secondary | ICD-10-CM | POA: Diagnosis not present

## 2021-02-26 DIAGNOSIS — R197 Diarrhea, unspecified: Secondary | ICD-10-CM | POA: Diagnosis not present

## 2021-02-26 LAB — CBC WITH DIFFERENTIAL (CANCER CENTER ONLY)
Abs Immature Granulocytes: 0.04 10*3/uL (ref 0.00–0.07)
Basophils Absolute: 0.1 10*3/uL (ref 0.0–0.1)
Basophils Relative: 0 %
Eosinophils Absolute: 0.1 10*3/uL (ref 0.0–0.5)
Eosinophils Relative: 1 %
HCT: 35.3 % — ABNORMAL LOW (ref 36.0–46.0)
Hemoglobin: 11.3 g/dL — ABNORMAL LOW (ref 12.0–15.0)
Immature Granulocytes: 0 %
Lymphocytes Relative: 22 %
Lymphs Abs: 2.6 10*3/uL (ref 0.7–4.0)
MCH: 28.3 pg (ref 26.0–34.0)
MCHC: 32 g/dL (ref 30.0–36.0)
MCV: 88.5 fL (ref 80.0–100.0)
Monocytes Absolute: 0.6 10*3/uL (ref 0.1–1.0)
Monocytes Relative: 5 %
Neutro Abs: 8.2 10*3/uL — ABNORMAL HIGH (ref 1.7–7.7)
Neutrophils Relative %: 72 %
Platelet Count: 516 10*3/uL — ABNORMAL HIGH (ref 150–400)
RBC: 3.99 MIL/uL (ref 3.87–5.11)
RDW: 16.2 % — ABNORMAL HIGH (ref 11.5–15.5)
WBC Count: 11.6 10*3/uL — ABNORMAL HIGH (ref 4.0–10.5)
nRBC: 0 % (ref 0.0–0.2)

## 2021-02-26 LAB — CMP (CANCER CENTER ONLY)
ALT: 13 U/L (ref 0–44)
AST: 18 U/L (ref 15–41)
Albumin: 3.2 g/dL — ABNORMAL LOW (ref 3.5–5.0)
Alkaline Phosphatase: 117 U/L (ref 38–126)
Anion gap: 9 (ref 5–15)
BUN: 15 mg/dL (ref 8–23)
CO2: 28 mmol/L (ref 22–32)
Calcium: 9.4 mg/dL (ref 8.9–10.3)
Chloride: 97 mmol/L — ABNORMAL LOW (ref 98–111)
Creatinine: 0.89 mg/dL (ref 0.44–1.00)
GFR, Estimated: >60 mL/min (ref >60)
Glucose, Bld: 259 mg/dL — ABNORMAL HIGH (ref 70–99)
Potassium: 3.7 mmol/L (ref 3.5–5.1)
Sodium: 134 mmol/L — ABNORMAL LOW (ref 135–145)
Total Bilirubin: 0.2 mg/dL — ABNORMAL LOW (ref 0.3–1.2)
Total Protein: 7 g/dL (ref 6.5–8.1)

## 2021-02-27 ENCOUNTER — Encounter: Payer: Self-pay | Admitting: Internal Medicine

## 2021-02-27 DIAGNOSIS — E46 Unspecified protein-calorie malnutrition: Secondary | ICD-10-CM | POA: Diagnosis not present

## 2021-02-27 DIAGNOSIS — C3490 Malignant neoplasm of unspecified part of unspecified bronchus or lung: Secondary | ICD-10-CM | POA: Diagnosis not present

## 2021-02-27 DIAGNOSIS — E1165 Type 2 diabetes mellitus with hyperglycemia: Secondary | ICD-10-CM | POA: Diagnosis not present

## 2021-02-27 DIAGNOSIS — R079 Chest pain, unspecified: Secondary | ICD-10-CM | POA: Diagnosis not present

## 2021-02-27 DIAGNOSIS — R634 Abnormal weight loss: Secondary | ICD-10-CM | POA: Diagnosis not present

## 2021-02-27 NOTE — Progress Notes (Signed)
Called pt to introduce myself as her Arboriculturist.  Unfortunately there aren't any foundations offering copay assistance for her Dx and the type of ins she has.  Pt gave me consent to talk to her son so I informed him of the [employer], went over what it covers and gave him the income requirement.  Pt would like to apply so she will bring her proof of income on 03/02/21.  If approved I will give her an expense sheet and my card for any questions or concerns she may have in the future.

## 2021-03-02 ENCOUNTER — Other Ambulatory Visit: Payer: Self-pay

## 2021-03-02 ENCOUNTER — Inpatient Hospital Stay: Payer: Medicare Other

## 2021-03-02 ENCOUNTER — Encounter: Payer: Self-pay | Admitting: Internal Medicine

## 2021-03-02 VITALS — BP 161/78 | HR 86 | Temp 98.2°F | Resp 20

## 2021-03-02 DIAGNOSIS — J90 Pleural effusion, not elsewhere classified: Secondary | ICD-10-CM | POA: Diagnosis not present

## 2021-03-02 DIAGNOSIS — R7989 Other specified abnormal findings of blood chemistry: Secondary | ICD-10-CM | POA: Diagnosis not present

## 2021-03-02 DIAGNOSIS — Z5111 Encounter for antineoplastic chemotherapy: Secondary | ICD-10-CM | POA: Diagnosis not present

## 2021-03-02 DIAGNOSIS — I700 Atherosclerosis of aorta: Secondary | ICD-10-CM | POA: Diagnosis not present

## 2021-03-02 DIAGNOSIS — Z79899 Other long term (current) drug therapy: Secondary | ICD-10-CM | POA: Diagnosis not present

## 2021-03-02 DIAGNOSIS — I2510 Atherosclerotic heart disease of native coronary artery without angina pectoris: Secondary | ICD-10-CM | POA: Diagnosis not present

## 2021-03-02 DIAGNOSIS — R0609 Other forms of dyspnea: Secondary | ICD-10-CM | POA: Diagnosis not present

## 2021-03-02 DIAGNOSIS — Z9049 Acquired absence of other specified parts of digestive tract: Secondary | ICD-10-CM | POA: Diagnosis not present

## 2021-03-02 DIAGNOSIS — Z8616 Personal history of COVID-19: Secondary | ICD-10-CM | POA: Diagnosis not present

## 2021-03-02 DIAGNOSIS — C50411 Malignant neoplasm of upper-outer quadrant of right female breast: Secondary | ICD-10-CM | POA: Diagnosis not present

## 2021-03-02 DIAGNOSIS — R5383 Other fatigue: Secondary | ICD-10-CM | POA: Diagnosis not present

## 2021-03-02 DIAGNOSIS — Z170 Estrogen receptor positive status [ER+]: Secondary | ICD-10-CM | POA: Diagnosis not present

## 2021-03-02 DIAGNOSIS — C3492 Malignant neoplasm of unspecified part of left bronchus or lung: Secondary | ICD-10-CM

## 2021-03-02 DIAGNOSIS — R634 Abnormal weight loss: Secondary | ICD-10-CM | POA: Diagnosis not present

## 2021-03-02 DIAGNOSIS — C342 Malignant neoplasm of middle lobe, bronchus or lung: Secondary | ICD-10-CM | POA: Diagnosis not present

## 2021-03-02 DIAGNOSIS — R197 Diarrhea, unspecified: Secondary | ICD-10-CM | POA: Diagnosis not present

## 2021-03-02 DIAGNOSIS — I10 Essential (primary) hypertension: Secondary | ICD-10-CM | POA: Diagnosis not present

## 2021-03-02 DIAGNOSIS — J449 Chronic obstructive pulmonary disease, unspecified: Secondary | ICD-10-CM | POA: Diagnosis not present

## 2021-03-02 DIAGNOSIS — R531 Weakness: Secondary | ICD-10-CM | POA: Diagnosis not present

## 2021-03-02 DIAGNOSIS — C770 Secondary and unspecified malignant neoplasm of lymph nodes of head, face and neck: Secondary | ICD-10-CM | POA: Diagnosis not present

## 2021-03-02 DIAGNOSIS — R0789 Other chest pain: Secondary | ICD-10-CM | POA: Diagnosis not present

## 2021-03-02 DIAGNOSIS — E119 Type 2 diabetes mellitus without complications: Secondary | ICD-10-CM | POA: Diagnosis not present

## 2021-03-02 DIAGNOSIS — K5900 Constipation, unspecified: Secondary | ICD-10-CM | POA: Diagnosis not present

## 2021-03-02 MED ORDER — DIPHENHYDRAMINE HCL 50 MG/ML IJ SOLN
INTRAMUSCULAR | Status: AC
  Filled 2021-03-02: qty 1

## 2021-03-02 MED ORDER — FAMOTIDINE 20 MG IN NS 100 ML IVPB
20.0000 mg | Freq: Once | INTRAVENOUS | Status: AC
  Administered 2021-03-02: 20 mg via INTRAVENOUS

## 2021-03-02 MED ORDER — PALONOSETRON HCL INJECTION 0.25 MG/5ML
0.2500 mg | Freq: Once | INTRAVENOUS | Status: AC
  Administered 2021-03-02: 0.25 mg via INTRAVENOUS

## 2021-03-02 MED ORDER — DIPHENHYDRAMINE HCL 50 MG/ML IJ SOLN
50.0000 mg | Freq: Once | INTRAMUSCULAR | Status: AC
  Administered 2021-03-02: 50 mg via INTRAVENOUS

## 2021-03-02 MED ORDER — SODIUM CHLORIDE 0.9 % IV SOLN
Freq: Once | INTRAVENOUS | Status: AC
  Filled 2021-03-02: qty 250

## 2021-03-02 MED ORDER — SODIUM CHLORIDE 0.9 % IV SOLN
20.0000 mg | Freq: Once | INTRAVENOUS | Status: AC
  Administered 2021-03-02: 20 mg via INTRAVENOUS
  Filled 2021-03-02: qty 20

## 2021-03-02 MED ORDER — PALONOSETRON HCL INJECTION 0.25 MG/5ML
INTRAVENOUS | Status: AC
  Filled 2021-03-02: qty 5

## 2021-03-02 MED ORDER — FAMOTIDINE 20 MG IN NS 100 ML IVPB
INTRAVENOUS | Status: AC
  Filled 2021-03-02: qty 100

## 2021-03-02 MED ORDER — SODIUM CHLORIDE 0.9 % IV SOLN
127.8000 mg | Freq: Once | INTRAVENOUS | Status: AC
  Administered 2021-03-02: 130 mg via INTRAVENOUS
  Filled 2021-03-02: qty 13

## 2021-03-02 MED ORDER — SODIUM CHLORIDE 0.9 % IV SOLN
45.0000 mg/m2 | Freq: Once | INTRAVENOUS | Status: AC
  Administered 2021-03-02: 72 mg via INTRAVENOUS
  Filled 2021-03-02: qty 12

## 2021-03-02 NOTE — Patient Instructions (Addendum)
[facility]  Discharge Instructions: Thank you for choosing [facility] to provide your oncology and hematology care.   If you have a lab appointment with the [facility], please go directly to the [facility] and check in at the registration area.   Wear comfortable clothing and clothing appropriate for easy access to any Portacath or PICC line.   We strive to give you quality time with your provider. You may need to reschedule your appointment if you arrive late (15 or more minutes).  Arriving late affects you and other patients whose appointments are after yours.  Also, if you miss three or more appointments without notifying the office, you may be dismissed from the clinic at the provider's discretion.      For prescription refill requests, have your pharmacy contact our office and allow 72 hours for refills to be completed.    Today you received the following chemotherapy and/or immunotherapy agents : Paclitaxel, Carboplatin     To help prevent nausea and vomiting after your treatment, we encourage you to take your nausea medication as directed.  BELOW ARE SYMPTOMS THAT SHOULD BE REPORTED IMMEDIATELY: *FEVER GREATER THAN 100.4 F (38 C) OR HIGHER *CHILLS OR SWEATING *NAUSEA AND VOMITING THAT IS NOT CONTROLLED WITH YOUR NAUSEA MEDICATION *UNUSUAL SHORTNESS OF BREATH *UNUSUAL BRUISING OR BLEEDING *URINARY PROBLEMS (pain or burning when urinating, or frequent urination) *BOWEL PROBLEMS (unusual diarrhea, constipation, pain near the anus) TENDERNESS IN MOUTH AND THROAT WITH OR WITHOUT PRESENCE OF ULCERS (sore throat, sores in mouth, or a toothache) UNUSUAL RASH, SWELLING OR PAIN  UNUSUAL VAGINAL DISCHARGE OR ITCHING   Items with * indicate a potential emergency and should be followed up as soon as possible or go to the Emergency Department if any problems should occur.  Please show the CHEMOTHERAPY ALERT CARD or IMMUNOTHERAPY ALERT CARD  at check-in to the Emergency Department and triage nurse.  Should you have questions after your visit or need to cancel or reschedule your appointment, please contact [facility]  Dept: [phone]  and follow the prompts.  Office hours are 8:00 a.m. to 4:30 p.m. Monday - Friday. Please note that voicemails left after 4:00 p.m. may not be returned until the following business day.  We are closed weekends and major holidays. You have access to a nurse at all times for urgent questions. Please call the main number to the clinic Dept: [phone] and follow the prompts.   For any non-urgent questions, you may also contact your provider using MyChart. We now offer e-Visits for anyone 39 and older to request care online for non-urgent symptoms. For details visit [email or web address].   Also download the MyChart app! Go to the app store, search "MyChart", open the app, select [facility], and log in with your MyChart username and password.  Due to Covid, a mask is required upon entering the hospital/clinic. If you do not have a mask, one will be given to you upon arrival. For doctor visits, patients may have 1 support person aged 53 or older with them. For treatment visits, patients cannot have anyone with them due to current Covid guidelines and our immunocompromised population.   Paclitaxel injection What is this medication? PACLITAXEL (PAK li TAX el) is a chemotherapy drug. It targets fast dividing cells, like cancer cells, and causes these cells to die. This medicine is used to treat ovarian cancer, breast cancer, lung cancer, Kaposi's sarcoma, andother cancers. This medicine may  be used for other purposes; ask your health care provider orpharmacist if you have questions. COMMON BRAND NAME(S): Onxol, Taxol What should I tell my care team before I take this medication? They need to know if you have any of these conditions: history of irregular heartbeat liver  disease low blood counts, like low white cell, platelet, or red cell counts lung or breathing disease, like asthma tingling of the fingers or toes, or other nerve disorder an unusual or allergic reaction to paclitaxel, alcohol, polyoxyethylated castor oil, other chemotherapy, other medicines, foods, dyes, or preservatives pregnant or trying to get pregnant breast-feeding How should I use this medication? This drug is given as an infusion into a vein. It is administered in a hospitalor clinic by a specially trained health care professional. Talk to your pediatrician regarding the use of this medicine in children.Special care may be needed. Overdosage: If you think you have taken too much of this medicine contact apoison control center or emergency room at once. NOTE: This medicine is only for you. Do not share this medicine with others. What if I miss a dose? It is important not to miss your dose. Call your doctor or health careprofessional if you are unable to keep an appointment. What may interact with this medication? Do not take this medicine with any of the following medications: live virus vaccines This medicine may also interact with the following medications: antiviral medicines for hepatitis, HIV or AIDS certain antibiotics like erythromycin and clarithromycin certain medicines for fungal infections like ketoconazole and itraconazole certain medicines for seizures like carbamazepine, phenobarbital, phenytoin gemfibrozil nefazodone rifampin St. John's wort This list may not describe all possible interactions. Give your health care provider a list of all the medicines, herbs, non-prescription drugs, or dietary supplements you use. Also tell them if you smoke, drink alcohol, or use illegaldrugs. Some items may interact with your medicine. What should I watch for while using this medication? Your condition will be monitored carefully while you are receiving this medicine. You will  need important blood work done while you are taking thismedicine. This medicine can cause serious allergic reactions. To reduce your risk you will need to take other medicine(s) before treatment with this medicine. If you experience allergic reactions like skin rash, itching or hives, swelling of theface, lips, or tongue, tell your doctor or health care professional right away. In some cases, you may be given additional medicines to help with side effects.Follow all directions for their use. This drug may make you feel generally unwell. This is not uncommon, as chemotherapy can affect healthy cells as well as cancer cells. Report any side effects. Continue your course of treatment even though you feel ill unless yourdoctor tells you to stop. Call your doctor or health care professional for advice if you get a fever, chills or sore throat, or other symptoms of a cold or flu. Do not treat yourself. This drug decreases your body's ability to fight infections. Try toavoid being around people who are sick. This medicine may increase your risk to bruise or bleed. Call your doctor orhealth care professional if you notice any unusual bleeding. Be careful brushing and flossing your teeth or using a toothpick because you may get an infection or bleed more easily. If you have any dental work done,tell your dentist you are receiving this medicine. Avoid taking products that contain aspirin, acetaminophen, ibuprofen, naproxen, or ketoprofen unless instructed by your doctor. These medicines may hide afever. Do not become pregnant while taking this  medicine. Women should inform their doctor if they wish to become pregnant or think they might be pregnant. There is a potential for serious side effects to an unborn child. Talk to your health care professional or pharmacist for more information. Do not breast-feed aninfant while taking this medicine. Men are advised not to father a child while receiving this medicine. This  product may contain alcohol. Ask your pharmacist or healthcare provider if this medicine contains alcohol. Be sure to tell all healthcare providers you are taking this medicine. Certain medicines, like metronidazole and disulfiram, can cause an unpleasant reaction when taken with alcohol. The reaction includes flushing, headache, nausea, vomiting, sweating, and increased thirst. Thereaction can last from 30 minutes to several hours. What side effects may I notice from receiving this medication? Side effects that you should report to your doctor or health care professionalas soon as possible: allergic reactions like skin rash, itching or hives, swelling of the face, lips, or tongue breathing problems changes in vision fast, irregular heartbeat high or low blood pressure mouth sores pain, tingling, numbness in the hands or feet signs of decreased platelets or bleeding - bruising, pinpoint red spots on the skin, black, tarry stools, blood in the urine signs of decreased red blood cells - unusually weak or tired, feeling faint or lightheaded, falls signs of infection - fever or chills, cough, sore throat, pain or difficulty passing urine signs and symptoms of liver injury like dark yellow or brown urine; general ill feeling or flu-like symptoms; light-colored stools; loss of appetite; nausea; right upper belly pain; unusually weak or tired; yellowing of the eyes or skin swelling of the ankles, feet, hands unusually slow heartbeat Side effects that usually do not require medical attention (report to yourdoctor or health care professional if they continue or are bothersome): diarrhea hair loss loss of appetite muscle or joint pain nausea, vomiting pain, redness, or irritation at site where injected tiredness This list may not describe all possible side effects. Call your doctor for medical advice about side effects. You may report side effects to FDA at1-800-FDA-1088. Where should I keep my  medication? This drug is given in a hospital or clinic and will not be stored at home. NOTE: This sheet is a summary. It may not cover all possible information. If you have questions about this medicine, talk to your doctor, pharmacist, orhealth care provider.  2022 Elsevier/Gold Standard (2019-08-01 13:37:23)  Carboplatin injection What is this medication? CARBOPLATIN (KAR boe pla tin) is a chemotherapy drug. It targets fast dividing cells, like cancer cells, and causes these cells to die. This medicine is usedto treat ovarian cancer and many other cancers. This medicine may be used for other purposes; ask your health care provider orpharmacist if you have questions. COMMON BRAND NAME(S): Paraplatin What should I tell my care team before I take this medication? They need to know if you have any of these conditions: blood disorders hearing problems kidney disease recent or ongoing radiation therapy an unusual or allergic reaction to carboplatin, cisplatin, other chemotherapy, other medicines, foods, dyes, or preservatives pregnant or trying to get pregnant breast-feeding How should I use this medication? This drug is usually given as an infusion into a vein. It is administered in [facility] or clinic by a specially trained health care professional. Talk to your pediatrician regarding the use of this medicine in children.Special care may be needed. Overdosage: If you think you have taken too much of this medicine contact apoison control center or emergency room  at once. NOTE: This medicine is only for you. Do not share this medicine with others. What if I miss a dose? It is important not to miss a dose. Call your doctor or health careprofessional if you are unable to keep an appointment. What may interact with this medication? medicines for seizures medicines to increase blood counts like filgrastim, pegfilgrastim, sargramostim some antibiotics like amikacin, gentamicin, neomycin,  streptomycin, tobramycin vaccines Talk to your doctor or health care professional before taking any of thesemedicines: acetaminophen aspirin ibuprofen ketoprofen naproxen This list may not describe all possible interactions. Give your health care provider a list of all the medicines, herbs, non-prescription drugs, or dietary supplements you use. Also tell them if you smoke, drink alcohol, or use illegaldrugs. Some items may interact with your medicine. What should I watch for while using this medication? Your condition will be monitored carefully while you are receiving this medicine. You will need important blood work done while you are taking thismedicine. This drug may make you feel generally unwell. This is not uncommon, as chemotherapy can affect healthy cells as well as cancer cells. Report any side effects. Continue your course of treatment even though you feel ill unless yourdoctor tells you to stop. In some cases, you may be given additional medicines to help with side effects.Follow all directions for their use. Call your doctor or health care professional for advice if you get a fever, chills or sore throat, or other symptoms of a cold or flu. Do not treat yourself. This drug decreases your body's ability to fight infections. Try toavoid being around people who are sick. This medicine may increase your risk to bruise or bleed. Call your doctor orhealth care professional if you notice any unusual bleeding. Be careful brushing and flossing your teeth or using a toothpick because you may get an infection or bleed more easily. If you have any dental work done,tell your dentist you are receiving this medicine. Avoid taking products that contain aspirin, acetaminophen, ibuprofen, naproxen, or ketoprofen unless instructed by your doctor. These medicines may hide afever. Do not become pregnant while taking this medicine. Women should inform their doctor if they wish to become pregnant or think  they might be pregnant. There is a potential for serious side effects to an unborn child. Talk to your health care professional or pharmacist for more information. Do not breast-feed aninfant while taking this medicine. What side effects may I notice from receiving this medication? Side effects that you should report to your doctor or health care professionalas soon as possible: allergic reactions like skin rash, itching or hives, swelling of the face, lips, or tongue signs of infection - fever or chills, cough, sore throat, pain or difficulty passing urine signs of decreased platelets or bleeding - bruising, pinpoint red spots on the skin, black, tarry stools, nosebleeds signs of decreased red blood cells - unusually weak or tired, fainting spells, lightheadedness breathing problems changes in hearing changes in vision chest pain high blood pressure low blood counts - This drug may decrease the number of white blood cells, red blood cells and platelets. You may be at increased risk for infections and bleeding. nausea and vomiting pain, swelling, redness or irritation at the injection site pain, tingling, numbness in the hands or feet problems with balance, talking, walking trouble passing urine or change in the amount of urine Side effects that usually do not require medical attention (report to yourdoctor or health care professional if they continue or are bothersome): hair  loss loss of appetite metallic taste in the mouth or changes in taste This list may not describe all possible side effects. Call your doctor for medical advice about side effects. You may report side effects to FDA at1-800-FDA-1088. Where should I keep my medication? This drug is given in a hospital or clinic and will not be stored at home. NOTE: This sheet is a summary. It may not cover all possible information. If you have questions about this medicine, talk to your doctor, pharmacist, orhealth care provider.  2022  Elsevier/Gold Standard (2007-12-05 14:38:05)

## 2021-03-02 NOTE — Progress Notes (Signed)
Pt is approved for the $1000 [employer] grant.  

## 2021-03-03 NOTE — Progress Notes (Addendum)
[place] OFFICE PROGRESS NOTE  Dawn Cruel, MD [place] [address]  DIAGNOSIS: Stage IIIb (T3, N2, MX) non-small cell lung cancer,  squamous cell carcinoma. She presented with large right middle lobe lung mass in addition to subcarinal lymphadenopathy.  She was diagnosed in May 2022  PDL1: 95%  PRIOR THERAPY: None  CURRENT THERAPY: Concurrent chemoradiation with carboplatin for an AUC of 2 and paclitaxel 45 mg per metered squared first dose expected on 03/02/2021  INTERVAL HISTORY: Dawn [name] 80 y.o. female returns to the clinic today for a follow-up visit accompanied by her son, Dawn [name].  The patient was recently diagnosed with lung cancer.  Last week, she started her first cycle of concurrent chemoradiation.  She met with radiation oncology on 03/05/2021.  She tolerated her first cycle of chemotherapy last week well without any concerning adverse side effects.    At her last visit, the patient was set up to have supplemental oxygen due to a low oxygen saturation. The oxygen was approved but they never received a call to schedule delivery. Her oxygen saturation is 95% on RA today. Her son believes her breathing is similar to perhaps a little bit better. Overall, the patient is deconditioned and lives with her son.  Today she denies any fever, chills, or night sweats. She lost 7 lbs since her last appointment. Her son states she has actually been eating well. She also has been drinking a lot of fluid apparently. Her son states she reports dry mouth and that she called him at 5 AM for more water a few nights ago. Per further questioning, her metformin dose was recently reduced from BID to once a day as well as her pioglitazone was reportedly reduced from 45 mg to 15 mg. She occasionally has some chest discomfort which she takes tramadol 1 time daily. She took one today but still has some discomfort today. She denies any worsening shortness of breath compared  to her baseline.  She denies any hemoptysis. Her son thinks she may be coughing a little bit more recently but not enough to warrant taking any cough medication. She denies any nausea or vomiting. She had some diarrhea last week which resolved without intervention. She denies any headache or visual changes.  The patient is here today for evaluation before starting her second cycle of treatment.    MEDICAL HISTORY: Past Medical History:  Diagnosis Date   Anemia    Anxiety    Arthritis    Breast cancer of upper-outer quadrant of left female breast ([place]) 08/18/2015   Broken wrist    when younger x2   COPD (chronic obstructive pulmonary disease) ([place])    Hospitalization March 2011   COVID-19 01/22/2021   Dementia ([place])    Depression    Diabetes mellitus without complication ([place])    type 2   Dyspnea    Full dentures    H/O vaginal delivery 1962   x1    High cholesterol    History of home oxygen therapy    Hypertension    Memory changes    sharp decline over the last 3 months, per son as of 01/22/21,    Pneumonia 01/22/2021   Radiation    11/18/15- 12/16/15 to her Left Breast    ALLERGIES:  is allergic to ferumoxytol, pneumococcal vaccines, and repaglinide.  MEDICATIONS:  Current Outpatient Medications  Medication Sig Dispense Refill   acetaminophen (TYLENOL) 500 MG tablet Take 1 tablet (500 mg  total) by mouth every 8 (eight) hours. 60 tablet    albuterol (VENTOLIN HFA) 108 (90 Base) MCG/ACT inhaler Inhale 2 puffs into the lungs every 2 (two) hours as needed for wheezing or shortness of breath. Please instruct in usage 1 each 2   anastrozole (ARIMIDEX) 1 MG tablet Take 1 tablet (1 mg total) by mouth daily. 30 tablet 0   aspirin EC 81 MG tablet Take 81 mg by mouth daily.     Cholecalciferol (VITAMIN D3) 125 MCG (5000 UT) CAPS 1 tablet     ferrous sulfate 325 (65 FE) MG tablet Take 325 mg by mouth 2 (two) times daily.     metFORMIN (GLUCOPHAGE-XR) 500 MG 24 hr tablet 1 tablet with  evening meal     nicotine (NICODERM CQ - DOSED IN MG/24 HOURS) 14 mg/24hr patch Place 1 patch (14 mg total) onto the skin daily. 28 patch 0   nitroGLYCERIN (NITROSTAT) 0.4 MG SL tablet Place 0.4 mg under the tongue every 15 (fifteen) minutes as needed for chest pain.     pantoprazole (PROTONIX) 40 MG tablet Take 1 tablet (40 mg total) by mouth daily before breakfast. 30 tablet 0   pentoxifylline (TRENTAL) 400 MG CR tablet 1 tablet with meals     pioglitazone (ACTOS) 15 MG tablet Take 1 tablet (15 mg total) by mouth daily. 30 tablet 1   prochlorperazine (COMPAZINE) 10 MG tablet Take 1 tablet (10 mg total) by mouth every 6 (six) hours as needed. 30 tablet 2   rosuvastatin (CRESTOR) 40 MG tablet Take 1 tablet (40 mg total) by mouth daily. 30 tablet 0   Tiotropium Bromide-Olodaterol (STIOLTO RESPIMAT) 2.5-2.5 MCG/ACT AERS Inhale 2 puffs into the lungs daily. 4 g 0   traMADol (ULTRAM) 50 MG tablet 1 tablet as needed     No current facility-administered medications for this visit.   Facility-Administered Medications Ordered in Other Visits  Medication Dose Route Frequency Provider Last Rate Last Admin   0.9 %  sodium chloride infusion   Intravenous Once Curt Bears, MD       0.9 %  sodium chloride infusion   Intravenous Continuous Ameenah Prosser L, PA-C       acetaminophen (TYLENOL) tablet 650 mg  650 mg Oral Once Taura Lamarre L, PA-C       CARBOplatin (PARAPLATIN) 110 mg in sodium chloride 0.9 % 100 mL chemo infusion  110 mg Intravenous Once Curt Bears, MD       dexamethasone (DECADRON) 20 mg in sodium chloride 0.9 % 50 mL IVPB  20 mg Intravenous Once Curt Bears, MD       diphenhydrAMINE (BENADRYL) injection 50 mg  50 mg Intravenous Once Curt Bears, MD       famotidine (PEPCID) IVPB 20 mg in NS 100 mL IVPB  20 mg Intravenous Once Curt Bears, MD       insulin aspart (novoLOG) injection 10 Units  10 Units Subcutaneous Once Quinto Tippy L,  PA-C       PACLitaxel (TAXOL) 72 mg in sodium chloride 0.9 % 150 mL chemo infusion (</= 75m/m2)  45 mg/m2 (Treatment Plan Recorded) Intravenous Once MCurt Bears MD       palonosetron (ALOXI) injection 0.25 mg  0.25 mg Intravenous Once MCurt Bears MD        SURGICAL HISTORY:  Past Surgical History:  Procedure Laterality Date   ABDOMINAL HYSTERECTOMY     APPENDECTOMY     BRONCHIAL BIOPSY  02/16/2021   Procedure: BRONCHIAL BIOPSIES;  Surgeon: Collene Gobble, MD;  Location[facility] ENDOSCOPY;  Service: Pulmonary;;   BRONCHIAL BRUSHINGS  02/16/2021   Procedure: BRONCHIAL BRUSHINGS;  Surgeon: Collene Gobble, MD;  Location: [facility] ENDOSCOPY;  Service: Pulmonary;;   BRONCHIAL NEEDLE ASPIRATION BIOPSY  02/16/2021   Procedure: BRONCHIAL NEEDLE ASPIRATION BIOPSIES;  Surgeon: Collene Gobble, MD;  Location: [facility] ENDOSCOPY;  Service: Pulmonary;;   CHOLECYSTECTOMY     EYE SURGERY     cataracts   RADIOACTIVE SEED GUIDED PARTIAL MASTECTOMY WITH AXILLARY SENTINEL LYMPH NODE BIOPSY Left 09/17/2015   Procedure: RADIOACTIVE SEED LOCALIZATION LEFT BREAST LUMPECTOMY AND LEFT AXILLARY SENTINEL LYMPH NODE BIOPSY;  Surgeon: Excell Seltzer, MD;  Location: [place] [facility];  Service: General;  Laterality: Left;   TONSILLECTOMY     VIDEO BRONCHOSCOPY WITH ENDOBRONCHIAL NAVIGATION Right 02/16/2021   Procedure: VIDEO BRONCHOSCOPY WITH ENDOBRONCHIAL NAVIGATION;  Surgeon: Collene Gobble, MD;  Location: [facility] ENDOSCOPY;  Service: Pulmonary;  Laterality: Right;    REVIEW OF SYSTEMS:   Review of Systems  Constitutional: Positive for fatigue, weight loss, and generalized weakness . Negative for appetite change, chills,  and fever. HENT: The patient is hard of hearing.  Negative for mouth sores, nosebleeds, sore throat and trouble swallowing.   Eyes: Negative for eye problems and icterus. Respiratory: Positive for baseline dyspnea on exertion and mild cough.  Negative for  hemoptysis and wheezing.   Cardiovascular: Negative for chest  pain and leg swelling. Gastrointestinal: Positive for diarrhea (resolved). Positive for polydipsia. Negative for abdominal pain, diarrhea, nausea and vomiting. Genitourinary: Negative for bladder incontinence, difficulty urinating, dysuria, frequency and hematuria.   Musculoskeletal: Negative for back pain, gait problem, neck pain and neck stiffness. Skin: Negative for itching and rash. Neurological: Negative for dizziness, extremity weakness, gait problem, headaches, light-headedness and seizures. Hematological: Negative for adenopathy. Does not bruise/bleed easily. Psychiatric/Behavioral: Positive for memory deficits.  Negative for confusion, depression and sleep disturbance. The patient is not nervous/anxious.     PHYSICAL EXAMINATION:  Blood pressure (!) 103/55, pulse 100, temperature 98.1 F (36.7 C), temperature source Tympanic, resp. rate 18, height '5\' 7"'  (1.702 m), weight 110 lb 14.4 oz (50.3 kg), SpO2 95 %.  ECOG PERFORMANCE STATUS: 1  Physical Exam  Constitutional: Oriented to person, place, and time and chronically ill-appearing female and in no distress. HENT: Head: Normocephalic and atraumatic. Mouth/Throat: Oropharynx is clear and moist. No oropharyngeal exudate. Eyes: Conjunctivae are normal. Right eye exhibits no discharge. Left eye exhibits no discharge. No scleral icterus. Neck: Normal range of motion. Neck supple. Cardiovascular: Normal rate, regular rhythm, normal heart sounds and intact distal pulses.   Pulmonary/Chest: Effort normal.  Quiet breath sounds bilaterally. No respiratory distress. No wheezes. No rales. Abdominal: Soft. Bowel sounds are normal. Exhibits no distension and no mass. There is no tenderness.  Musculoskeletal: Normal range of motion. Exhibits no edema.  Lymphadenopathy:    No cervical adenopathy.  Neurological: Alert and oriented to person, place, and time. Exhibits muscle wasting .tone.  Ambulates with a walker. Skin: Skin is warm and dry. No  rash noted. Not diaphoretic. No erythema. No pallor.  Psychiatric: Mood, memory and judgment normal. Vitals reviewed.  LABORATORY DATA: Lab Results  Component Value Date   WBC 7.4 03/09/2021   HGB 10.6 (L) 03/09/2021   HCT 32.8 (L) 03/09/2021   MCV 86.3 03/09/2021   PLT 388 03/09/2021      Chemistry      Component Value Date/Time   NA 132 (L) 03/09/2021 1137   NA 137  08/20/2015 1232   K 3.8 03/09/2021 1137   K 4.3 08/20/2015 1232   CL 97 (L) 03/09/2021 1137   CO2 25 03/09/2021 1137   CO2 25 08/20/2015 1232   BUN 16 03/09/2021 1137   BUN 10.4 08/20/2015 1232   CREATININE 1.33 (H) 03/09/2021 1137   CREATININE 0.89 02/26/2021 1358   CREATININE 1.1 08/20/2015 1232      Component Value Date/Time   CALCIUM 9.5 03/09/2021 1137   CALCIUM 10.0 08/20/2015 1232   ALKPHOS 118 03/09/2021 1137   ALKPHOS 142 08/20/2015 1232   AST 16 03/09/2021 1137   AST 18 02/26/2021 1358   AST 18 08/20/2015 1232   ALT <6 03/09/2021 1137   ALT 13 02/26/2021 1358   ALT 11 08/20/2015 1232   BILITOT 0.4 03/09/2021 1137   BILITOT 0.2 (L) 02/26/2021 1358   BILITOT 0.32 08/20/2015 1232       RADIOGRAPHIC STUDIES:  DG Chest 1 View  Result Date: 02/14/2021 CLINICAL DATA:  Status post thoracentesis EXAM: CHEST  1 VIEW COMPARISON:  02/12/2021 FINDINGS: Interval decrease in volume of a right pleural effusion, with improved aeration of the right lung. There is a redemonstrated large mass of the right middle lobe. The left lung is normally aerated. No pneumothorax. IMPRESSION: 1. Interval decrease in volume of a right pleural effusion, with improved aeration of the right lung. No pneumothorax. 2. There is a redemonstrated large mass of the right middle lobe. Electronically Signed   By: Eddie Candle M.D.   On: 02/14/2021 09:48   DG Chest 2 View  Result Date: 02/12/2021 CLINICAL DATA:  Generalized weakness, chest and epigastric pain, anemia, lung cancer EXAM: CHEST - 2 VIEW COMPARISON:  01/16/2021 FINDINGS:  Frontal and lateral views of the chest demonstrate an unremarkable cardiac silhouette. There is persistent consolidation within the right middle lobe compatible with known lung cancer. Small bilateral pleural effusions, right greater than left. The left chest is clear. No pneumothorax. IMPRESSION: 1. Persistent right middle lobe mass consistent with known lung cancer. 2. Small bilateral pleural effusions, right greater than left. Electronically Signed   By: Randa Ngo M.D.   On: 02/12/2021 23:34   CT ANGIO CHEST PE W OR WO CONTRAST  Result Date: 02/13/2021 CLINICAL DATA:  History of breast cancer.  Elevated D-dimer. EXAM: CT ANGIOGRAPHY CHEST WITH CONTRAST TECHNIQUE: Multidetector CT imaging of the chest was performed using the standard protocol during bolus administration of intravenous contrast. Multiplanar CT image reconstructions and MIPs were obtained to evaluate the vascular anatomy. CONTRAST:  34m OMNIPAQUE IOHEXOL 350 MG/ML SOLN COMPARISON:  01/02/2021 FINDINGS: Cardiovascular: No filling defects in the pulmonary arteries to suggest pulmonary emboli. Aortic atherosclerosis. Scattered coronary artery calcifications. Heart is normal size. Aorta normal caliber. Mediastinum/Nodes: Prominent mediastinal lymph nodes. 12 mm precarinal lymph node. Similar sized AP window lymph node. No axillary adenopathy. Lungs/Pleura: Central mass in the right middle lobe is again noted, difficult to measure precisely given peripheral postobstructive atelectasis or infiltrate, but measures approximately 6 x 3.8 cm. Moderate right pleural effusion. Compressive atelectasis in the right lower lobe. Left lung clear. Upper Abdomen: Calcifications in the spleen compatible with old granulomatous disease. No acute findings. Musculoskeletal: Mild compression fracture through the superior endplate of TI69 new since prior study. Review of the MIP images confirms the above findings. IMPRESSION: No evidence of pulmonary embolus. Large  central right middle lobe mass again noted, difficult to measure due to adjacent postobstructive atelectasis or infiltrate. Moderate right pleural effusion. New mild  compression fracture through the superior endplate of P50. Coronary artery disease. Aortic Atherosclerosis (ICD10-I70.0). Electronically Signed   By: Rolm Baptise M.D.   On: 02/13/2021 15:17   DG Chest Port 1 View  Result Date: 02/16/2021 CLINICAL DATA:  Status post bronchoscopy. EXAM: PORTABLE CHEST 1 VIEW COMPARISON:  February 14, 2021. FINDINGS: The heart size and mediastinal contours are within normal limits. No pneumothorax is noted. Left lung is clear. Increased right basilar opacity is noted concerning for increased atelectasis or infiltrate with probable associated effusion. The visualized skeletal structures are unremarkable. IMPRESSION: Increased right basilar opacity as described above. Electronically Signed   By: Marijo Conception M.D.   On: 02/16/2021 12:48   US THORACENTESIS ASP PLEURAL SPACE W/IMG GUIDE  Result Date: 02/14/2021 INDICATION: Patient with history of RML lung mass, COPD exacerbation, acute hypoxic respiratory failure, and right pleural effusion. Request is made for diagnostic and therapeutic right thoracentesis. EXAM: ULTRASOUND GUIDED DIAGNOSTIC AND THERAPEUTIC RIGHT THORACENTESIS MEDICATIONS: 10 mL 1% lidocaine COMPLICATIONS: None immediate. PROCEDURE: An ultrasound guided thoracentesis was thoroughly discussed with the patient and questions answered. The benefits, risks, alternatives and complications were also discussed. The patient understands and wishes to proceed with the procedure. Written consent was obtained. Ultrasound was performed to localize and mark an adequate pocket of fluid in the right chest. The area was then prepped and draped in the normal sterile fashion. 1% Lidocaine was used for local anesthesia. Under ultrasound guidance a 6 Fr Safe-T-Centesis catheter was introduced. Thoracentesis was performed. The  catheter was removed and a dressing applied. FINDINGS: A total of approximately 500 mL of clear gold fluid was removed. Samples were sent to the laboratory as requested by the clinical team. IMPRESSION: Successful ultrasound guided right thoracentesis yielding 500 mL of pleural fluid. Read by: Earley Abide, PA-C Electronically Signed   By: Aletta Edouard M.D.   On: 02/14/2021 10:02   DG C-ARM BRONCHOSCOPY  Result Date: 02/16/2021 C-ARM BRONCHOSCOPY: Fluoroscopy was utilized by the requesting physician.  No radiographic interpretation.     ASSESSMENT/PLAN:  This is a very pleasant 80 year old Caucasian female diagnosed with stage IIIb (T3, N2, Mx) non-small cell lung cancer, squamous cell carcinoma.  She presented with a large right middle lobe lung mass in addition to subcarinal lymphadenopathy.  She was diagnosed in May 2022.  PD-L1 testing was 95%   The patient is currently undergoing concurrent chemoradiation with carboplatin for an [facility] of 2 and paclitaxel 45 mg per metered square. She is status post 1 cycle  The last day of radiation is scheduled for 04/27/21.   Her blood sugar is elevated today at 450. We will arrange for her to receive 10 units of insulin. Her diabetes medication doses were recently reduced per her son. We will reach out to her PCP for further instructions or a follow up visit for management of her diabetes. I also encouraged her son to purchase a glucometer and to monitor her BS closely, especially if she is having signs of hyperglycemia, such as polydipsia. She will receive 1 L of fluid today as well. I reviewed with Dr. Julien Nordmann. Recommend that she proceed with cycle #2 today as scheduled.   We will provide the patient with a handout on signs and symptoms of hypo/hyperglycemia.   We will see her back for a follow up visit in 2 weeks for evaluation before starting cycle #4.   Oxygen saturation is 95% on room air. Her supplemental oxygen was never delivered but it appears  she does not need supplemental oxygen at this time. We will continue to monitor. She also has a history of pleural effusions. I discussed with her son if she every has worsening dyspnea on exertion or cough we can arrange for a CXR to ensure that she does not need a thoracentesis.   We will give her 650 mg of tylenol today due to her bone pain today.   I have referred her to a member of the nutritionist team today for her weight loss.   For her intermittent diarrhea, unclear if related to treatment or hyperglycemia or other medication side effect. This has resolved but advised they can use imodium if needed.   The patient was advised to call immediately if she has any concerning symptoms in the interval. The patient voices understanding of current disease status and treatment options and is in agreement with the current care plan. All questions were answered. The patient knows to call the clinic with any problems, questions or concerns. We can certainly see the patient much sooner if necessary          Orders Placed This Encounter  Procedures   Ambulatory Referral to [facility]    Referral Priority:   Routine    Referral Type:   Consultation    Referral Reason:   Specialty Services Required    Number of Visits Requested:   1      The total time spent in the appointment was 30-39 minutes in this encounter.   Bellamie Turney L Cody Albus, PA-C 03/09/21

## 2021-03-04 DIAGNOSIS — E1169 Type 2 diabetes mellitus with other specified complication: Secondary | ICD-10-CM | POA: Diagnosis not present

## 2021-03-04 DIAGNOSIS — R413 Other amnesia: Secondary | ICD-10-CM | POA: Diagnosis not present

## 2021-03-04 DIAGNOSIS — R627 Adult failure to thrive: Secondary | ICD-10-CM | POA: Diagnosis not present

## 2021-03-04 DIAGNOSIS — E876 Hypokalemia: Secondary | ICD-10-CM | POA: Diagnosis not present

## 2021-03-04 DIAGNOSIS — E1165 Type 2 diabetes mellitus with hyperglycemia: Secondary | ICD-10-CM | POA: Diagnosis not present

## 2021-03-04 DIAGNOSIS — M1990 Unspecified osteoarthritis, unspecified site: Secondary | ICD-10-CM | POA: Diagnosis not present

## 2021-03-04 DIAGNOSIS — D630 Anemia in neoplastic disease: Secondary | ICD-10-CM | POA: Diagnosis not present

## 2021-03-04 DIAGNOSIS — U071 COVID-19: Secondary | ICD-10-CM | POA: Diagnosis not present

## 2021-03-04 DIAGNOSIS — C50412 Malignant neoplasm of upper-outer quadrant of left female breast: Secondary | ICD-10-CM | POA: Diagnosis not present

## 2021-03-04 DIAGNOSIS — I10 Essential (primary) hypertension: Secondary | ICD-10-CM | POA: Diagnosis not present

## 2021-03-04 DIAGNOSIS — D381 Neoplasm of uncertain behavior of trachea, bronchus and lung: Secondary | ICD-10-CM | POA: Diagnosis not present

## 2021-03-04 DIAGNOSIS — E785 Hyperlipidemia, unspecified: Secondary | ICD-10-CM | POA: Diagnosis not present

## 2021-03-04 DIAGNOSIS — J449 Chronic obstructive pulmonary disease, unspecified: Secondary | ICD-10-CM | POA: Diagnosis not present

## 2021-03-04 NOTE — Addendum Note (Signed)
Encounter addended by: Tyler Pita, MD on: 03/04/2021 12:30 PM  Actions taken: Medication List reviewed, Problem List reviewed, Allergies reviewed, Clinical Note Signed

## 2021-03-05 ENCOUNTER — Other Ambulatory Visit: Payer: Self-pay

## 2021-03-05 ENCOUNTER — Ambulatory Visit
Admission: RE | Admit: 2021-03-05 | Discharge: 2021-03-05 | Disposition: A | Discharge status: Home / Self Care | Payer: Medicare Other | Source: Ambulatory Visit | Attending: Radiation Oncology | Admitting: Radiation Oncology

## 2021-03-05 DIAGNOSIS — C3491 Malignant neoplasm of unspecified part of right bronchus or lung: Secondary | ICD-10-CM

## 2021-03-05 DIAGNOSIS — C342 Malignant neoplasm of middle lobe, bronchus or lung: Secondary | ICD-10-CM | POA: Diagnosis not present

## 2021-03-05 DIAGNOSIS — Z510 Encounter for antineoplastic radiation therapy: Secondary | ICD-10-CM | POA: Diagnosis not present

## 2021-03-05 DIAGNOSIS — F17210 Nicotine dependence, cigarettes, uncomplicated: Secondary | ICD-10-CM | POA: Diagnosis not present

## 2021-03-05 NOTE — Progress Notes (Signed)
mm

## 2021-03-06 DIAGNOSIS — R627 Adult failure to thrive: Secondary | ICD-10-CM | POA: Diagnosis not present

## 2021-03-06 DIAGNOSIS — D381 Neoplasm of uncertain behavior of trachea, bronchus and lung: Secondary | ICD-10-CM | POA: Diagnosis not present

## 2021-03-06 DIAGNOSIS — C50412 Malignant neoplasm of upper-outer quadrant of left female breast: Secondary | ICD-10-CM | POA: Diagnosis not present

## 2021-03-06 DIAGNOSIS — J449 Chronic obstructive pulmonary disease, unspecified: Secondary | ICD-10-CM | POA: Diagnosis not present

## 2021-03-06 DIAGNOSIS — M1990 Unspecified osteoarthritis, unspecified site: Secondary | ICD-10-CM | POA: Diagnosis not present

## 2021-03-06 DIAGNOSIS — E1169 Type 2 diabetes mellitus with other specified complication: Secondary | ICD-10-CM | POA: Diagnosis not present

## 2021-03-06 DIAGNOSIS — I10 Essential (primary) hypertension: Secondary | ICD-10-CM | POA: Diagnosis not present

## 2021-03-06 DIAGNOSIS — E1165 Type 2 diabetes mellitus with hyperglycemia: Secondary | ICD-10-CM | POA: Diagnosis not present

## 2021-03-06 DIAGNOSIS — E876 Hypokalemia: Secondary | ICD-10-CM | POA: Diagnosis not present

## 2021-03-06 DIAGNOSIS — R413 Other amnesia: Secondary | ICD-10-CM | POA: Diagnosis not present

## 2021-03-06 DIAGNOSIS — E785 Hyperlipidemia, unspecified: Secondary | ICD-10-CM | POA: Diagnosis not present

## 2021-03-06 DIAGNOSIS — D630 Anemia in neoplastic disease: Secondary | ICD-10-CM | POA: Diagnosis not present

## 2021-03-06 DIAGNOSIS — U071 COVID-19: Secondary | ICD-10-CM | POA: Diagnosis not present

## 2021-03-09 ENCOUNTER — Telehealth: Payer: Self-pay | Admitting: Physician Assistant

## 2021-03-09 ENCOUNTER — Other Ambulatory Visit: Payer: Self-pay

## 2021-03-09 ENCOUNTER — Inpatient Hospital Stay: Payer: Medicare Other

## 2021-03-09 ENCOUNTER — Telehealth: Payer: Self-pay

## 2021-03-09 ENCOUNTER — Inpatient Hospital Stay: Payer: Medicare Other | Admitting: Physician Assistant

## 2021-03-09 VITALS — BP 103/55 | HR 100 | Temp 98.1°F | Resp 18 | Ht 67.0 in | Wt 110.9 lb

## 2021-03-09 DIAGNOSIS — C3491 Malignant neoplasm of unspecified part of right bronchus or lung: Secondary | ICD-10-CM

## 2021-03-09 DIAGNOSIS — C342 Malignant neoplasm of middle lobe, bronchus or lung: Secondary | ICD-10-CM | POA: Diagnosis not present

## 2021-03-09 DIAGNOSIS — Z5111 Encounter for antineoplastic chemotherapy: Secondary | ICD-10-CM | POA: Diagnosis not present

## 2021-03-09 DIAGNOSIS — C50411 Malignant neoplasm of upper-outer quadrant of right female breast: Secondary | ICD-10-CM | POA: Diagnosis not present

## 2021-03-09 DIAGNOSIS — I10 Essential (primary) hypertension: Secondary | ICD-10-CM | POA: Diagnosis not present

## 2021-03-09 DIAGNOSIS — R0789 Other chest pain: Secondary | ICD-10-CM | POA: Diagnosis not present

## 2021-03-09 DIAGNOSIS — D381 Neoplasm of uncertain behavior of trachea, bronchus and lung: Secondary | ICD-10-CM | POA: Diagnosis not present

## 2021-03-09 DIAGNOSIS — E1169 Type 2 diabetes mellitus with other specified complication: Secondary | ICD-10-CM | POA: Diagnosis not present

## 2021-03-09 DIAGNOSIS — I959 Hypotension, unspecified: Secondary | ICD-10-CM

## 2021-03-09 DIAGNOSIS — J449 Chronic obstructive pulmonary disease, unspecified: Secondary | ICD-10-CM | POA: Diagnosis not present

## 2021-03-09 DIAGNOSIS — Z170 Estrogen receptor positive status [ER+]: Secondary | ICD-10-CM

## 2021-03-09 DIAGNOSIS — E119 Type 2 diabetes mellitus without complications: Secondary | ICD-10-CM | POA: Diagnosis not present

## 2021-03-09 DIAGNOSIS — Z9049 Acquired absence of other specified parts of digestive tract: Secondary | ICD-10-CM | POA: Diagnosis not present

## 2021-03-09 DIAGNOSIS — U071 COVID-19: Secondary | ICD-10-CM | POA: Diagnosis not present

## 2021-03-09 DIAGNOSIS — R197 Diarrhea, unspecified: Secondary | ICD-10-CM | POA: Diagnosis not present

## 2021-03-09 DIAGNOSIS — R5383 Other fatigue: Secondary | ICD-10-CM | POA: Diagnosis not present

## 2021-03-09 DIAGNOSIS — R531 Weakness: Secondary | ICD-10-CM | POA: Diagnosis not present

## 2021-03-09 DIAGNOSIS — K5900 Constipation, unspecified: Secondary | ICD-10-CM | POA: Diagnosis not present

## 2021-03-09 DIAGNOSIS — M6281 Muscle weakness (generalized): Secondary | ICD-10-CM | POA: Diagnosis not present

## 2021-03-09 DIAGNOSIS — J90 Pleural effusion, not elsewhere classified: Secondary | ICD-10-CM | POA: Diagnosis not present

## 2021-03-09 DIAGNOSIS — Z79899 Other long term (current) drug therapy: Secondary | ICD-10-CM | POA: Diagnosis not present

## 2021-03-09 DIAGNOSIS — R0609 Other forms of dyspnea: Secondary | ICD-10-CM | POA: Diagnosis not present

## 2021-03-09 DIAGNOSIS — Z8616 Personal history of COVID-19: Secondary | ICD-10-CM | POA: Diagnosis not present

## 2021-03-09 DIAGNOSIS — R7989 Other specified abnormal findings of blood chemistry: Secondary | ICD-10-CM | POA: Diagnosis not present

## 2021-03-09 DIAGNOSIS — R634 Abnormal weight loss: Secondary | ICD-10-CM | POA: Diagnosis not present

## 2021-03-09 DIAGNOSIS — R918 Other nonspecific abnormal finding of lung field: Secondary | ICD-10-CM

## 2021-03-09 DIAGNOSIS — C770 Secondary and unspecified malignant neoplasm of lymph nodes of head, face and neck: Secondary | ICD-10-CM | POA: Diagnosis not present

## 2021-03-09 DIAGNOSIS — I2510 Atherosclerotic heart disease of native coronary artery without angina pectoris: Secondary | ICD-10-CM | POA: Diagnosis not present

## 2021-03-09 DIAGNOSIS — I700 Atherosclerosis of aorta: Secondary | ICD-10-CM | POA: Diagnosis not present

## 2021-03-09 LAB — COMPREHENSIVE METABOLIC PANEL
ALT: <6 U/L (ref 0–44)
AST: 16 U/L (ref 15–41)
Albumin: 2.8 g/dL — ABNORMAL LOW (ref 3.5–5.0)
Alkaline Phosphatase: 118 U/L (ref 38–126)
Anion gap: 10 (ref 5–15)
BUN: 16 mg/dL (ref 8–23)
CO2: 25 mmol/L (ref 22–32)
Calcium: 9.5 mg/dL (ref 8.9–10.3)
Chloride: 97 mmol/L — ABNORMAL LOW (ref 98–111)
Creatinine, Ser: 1.33 mg/dL — ABNORMAL HIGH (ref 0.44–1.00)
GFR, Estimated: 41 mL/min — ABNORMAL LOW (ref >60)
Glucose, Bld: 450 mg/dL — ABNORMAL HIGH (ref 70–99)
Potassium: 3.8 mmol/L (ref 3.5–5.1)
Sodium: 132 mmol/L — ABNORMAL LOW (ref 135–145)
Total Bilirubin: 0.4 mg/dL (ref 0.3–1.2)
Total Protein: 6.2 g/dL — ABNORMAL LOW (ref 6.5–8.1)

## 2021-03-09 LAB — CBC WITH DIFFERENTIAL/PLATELET
Abs Immature Granulocytes: 0.04 10*3/uL (ref 0.00–0.07)
Basophils Absolute: 0 10*3/uL (ref 0.0–0.1)
Basophils Relative: 0 %
Eosinophils Absolute: 0.2 10*3/uL (ref 0.0–0.5)
Eosinophils Relative: 2 %
HCT: 32.8 % — ABNORMAL LOW (ref 36.0–46.0)
Hemoglobin: 10.6 g/dL — ABNORMAL LOW (ref 12.0–15.0)
Immature Granulocytes: 1 %
Lymphocytes Relative: 27 %
Lymphs Abs: 2 10*3/uL (ref 0.7–4.0)
MCH: 27.9 pg (ref 26.0–34.0)
MCHC: 32.3 g/dL (ref 30.0–36.0)
MCV: 86.3 fL (ref 80.0–100.0)
Monocytes Absolute: 0.3 10*3/uL (ref 0.1–1.0)
Monocytes Relative: 4 %
Neutro Abs: 4.9 10*3/uL (ref 1.7–7.7)
Neutrophils Relative %: 66 %
Platelets: 388 10*3/uL (ref 150–400)
RBC: 3.8 MIL/uL — ABNORMAL LOW (ref 3.87–5.11)
RDW: 15.6 % — ABNORMAL HIGH (ref 11.5–15.5)
WBC: 7.4 10*3/uL (ref 4.0–10.5)
nRBC: 0 % (ref 0.0–0.2)

## 2021-03-09 MED ORDER — SODIUM CHLORIDE 0.9 % IV SOLN
45.0000 mg/m2 | Freq: Once | INTRAVENOUS | Status: AC
  Administered 2021-03-09: 72 mg via INTRAVENOUS
  Filled 2021-03-09: qty 12

## 2021-03-09 MED ORDER — DIPHENHYDRAMINE HCL 50 MG/ML IJ SOLN
50.0000 mg | Freq: Once | INTRAMUSCULAR | Status: AC
  Administered 2021-03-09: 50 mg via INTRAVENOUS

## 2021-03-09 MED ORDER — ACETAMINOPHEN 325 MG PO TABS
650.0000 mg | ORAL_TABLET | Freq: Once | ORAL | Status: AC
  Administered 2021-03-09: 650 mg via ORAL

## 2021-03-09 MED ORDER — PALONOSETRON HCL INJECTION 0.25 MG/5ML
0.2500 mg | Freq: Once | INTRAVENOUS | Status: AC
  Administered 2021-03-09: 0.25 mg via INTRAVENOUS

## 2021-03-09 MED ORDER — PALONOSETRON HCL INJECTION 0.25 MG/5ML
INTRAVENOUS | Status: AC
  Filled 2021-03-09: qty 5

## 2021-03-09 MED ORDER — ACETAMINOPHEN 325 MG PO TABS
ORAL_TABLET | ORAL | Status: AC
  Filled 2021-03-09: qty 2

## 2021-03-09 MED ORDER — INSULIN ASPART 100 UNIT/ML IJ SOLN
INTRAMUSCULAR | Status: AC
  Filled 2021-03-09: qty 1

## 2021-03-09 MED ORDER — INSULIN ASPART 100 UNIT/ML IJ SOLN
10.0000 [IU] | Freq: Once | INTRAMUSCULAR | Status: AC
Start: 2021-03-09 — End: 2021-03-09
  Administered 2021-03-09: 10 [IU] via SUBCUTANEOUS

## 2021-03-09 MED ORDER — SODIUM CHLORIDE 0.9 % IV SOLN
20.0000 mg | Freq: Once | INTRAVENOUS | Status: AC
  Administered 2021-03-09: 20 mg via INTRAVENOUS
  Filled 2021-03-09: qty 20

## 2021-03-09 MED ORDER — FAMOTIDINE 20 MG IN NS 100 ML IVPB
INTRAVENOUS | Status: AC
  Filled 2021-03-09: qty 100

## 2021-03-09 MED ORDER — DIPHENHYDRAMINE HCL 50 MG/ML IJ SOLN
INTRAMUSCULAR | Status: AC
  Filled 2021-03-09: qty 1

## 2021-03-09 MED ORDER — SODIUM CHLORIDE 0.9 % IV SOLN
Freq: Once | INTRAVENOUS | Status: AC
Start: 2021-03-09 — End: 2021-03-09
  Filled 2021-03-09: qty 250

## 2021-03-09 MED ORDER — FAMOTIDINE 20 MG IN NS 100 ML IVPB
20.0000 mg | Freq: Once | INTRAVENOUS | Status: AC
  Administered 2021-03-09: 20 mg via INTRAVENOUS

## 2021-03-09 MED ORDER — SODIUM CHLORIDE 0.9 % IV SOLN
INTRAVENOUS | Status: DC
  Filled 2021-03-09 (×2): qty 250

## 2021-03-09 MED ORDER — SODIUM CHLORIDE 0.9 % IV SOLN
110.0000 mg | Freq: Once | INTRAVENOUS | Status: AC
  Administered 2021-03-09: 110 mg via INTRAVENOUS
  Filled 2021-03-09: qty 11

## 2021-03-09 NOTE — Telephone Encounter (Signed)
This nurse contacted Dr. Harrington Challenger, per request of C. Heilingoetter, PA. Informed that patient was seen in our office today and her son expressed concern that she has been drinking a lot of water. She has diabetes. It appears that the metformin and pioglitazone does were recently reduced. Patient BS is 450 today.  Patient will receive  insulin but it is recommended that she be seen in the office this week to manage her diabetes closer or give her instructions if they need to increase her doses of metformin or pioglitazone to the doses she previously was on. No further questions or concerns at this time.

## 2021-03-09 NOTE — Telephone Encounter (Signed)
Scheduled appt per 6/27 sch msg. Pt's son is aware.

## 2021-03-09 NOTE — Progress Notes (Signed)
New dose of carboplatin due to creatinine of 1.33 will be 110 mg.  OK to modify dose.  T.O. Cassandra Heilingoetter, PA-C/Zakhari Fogel Ronnald Ramp, PharmD

## 2021-03-09 NOTE — Patient Instructions (Addendum)
[facility]  Discharge Instructions: Thank you for choosing [facility] to provide your oncology and hematology care.   If you have a lab appointment with the [facility], please go directly to the [facility] and check in at the registration area.   Wear comfortable clothing and clothing appropriate for easy access to any Portacath or PICC line.   We strive to give you quality time with your provider. You may need to reschedule your appointment if you arrive late (15 or more minutes).  Arriving late affects you and other patients whose appointments are after yours.  Also, if you miss three or more appointments without notifying the office, you may be dismissed from the clinic at the provider's discretion.      For prescription refill requests, have your pharmacy contact our office and allow 72 hours for refills to be completed.    Today you received the following chemotherapy and/or immunotherapy agents : Paclitaxel, Carboplatin     To help prevent nausea and vomiting after your treatment, we encourage you to take your nausea medication as directed.  BELOW ARE SYMPTOMS THAT SHOULD BE REPORTED IMMEDIATELY: *FEVER GREATER THAN 100.4 F (38 C) OR HIGHER *CHILLS OR SWEATING *NAUSEA AND VOMITING THAT IS NOT CONTROLLED WITH YOUR NAUSEA MEDICATION *UNUSUAL SHORTNESS OF BREATH *UNUSUAL BRUISING OR BLEEDING *URINARY PROBLEMS (pain or burning when urinating, or frequent urination) *BOWEL PROBLEMS (unusual diarrhea, constipation, pain near the anus) TENDERNESS IN MOUTH AND THROAT WITH OR WITHOUT PRESENCE OF ULCERS (sore throat, sores in mouth, or a toothache) UNUSUAL RASH, SWELLING OR PAIN  UNUSUAL VAGINAL DISCHARGE OR ITCHING   Items with * indicate a potential emergency and should be followed up as soon as possible or go to the Emergency Department if any problems should occur.  Please show the CHEMOTHERAPY ALERT CARD or IMMUNOTHERAPY ALERT CARD  at check-in to the Emergency Department and triage nurse.  Should you have questions after your visit or need to cancel or reschedule your appointment, please contact [facility]  Dept: [phone]  and follow the prompts.  Office hours are 8:00 a.m. to 4:30 p.m. Monday - Friday. Please note that voicemails left after 4:00 p.m. may not be returned until the following business day.  We are closed weekends and major holidays. You have access to a nurse at all times for urgent questions. Please call the main number to the clinic Dept: [phone] and follow the prompts.   For any non-urgent questions, you may also contact your provider using MyChart. We now offer e-Visits for anyone 28 and older to request care online for non-urgent symptoms. For details visit [email or web address].   Also download the MyChart app! Go to the app store, search "MyChart", open the app, select [facility], and log in with your MyChart username and password.  Due to Covid, a mask is required upon entering the hospital/clinic. If you do not have a mask, one will be given to you upon arrival. For doctor visits, patients may have 1 support person aged 59 or older with them. For treatment visits, patients cannot have anyone with them due to current Covid guidelines and our immunocompromised population.   Hyperglycemia Hyperglycemia is when the sugar (glucose) level in your blood is too high. High blood sugar can happen to people who have or do not have diabetes. High blood sugar can happen quickly. It can be anemergency. What are the causes? If you have diabetes, high blood  sugar may be caused by: Medicines that increase blood sugar or affect your control of diabetes. Getting less physical activity. Overeating. Being sick or injured or having an infection. Having surgery. Stress. Not giving yourself enough insulin (if you are taking it). You may have high blood sugar because you have  diabetes that has not beendiagnosed yet. If you do not have diabetes, high blood sugar may be caused by: Certain medicines. Stress. A bad illness. An infection. Having surgery. Diseases of the pancreas. What increases the risk? This condition is more likely to develop in people who have risk factors for diabetes, such as: Having a family member with diabetes. Certain conditions in which the body's defense system (immune system) attacks itself. These are called autoimmune disorders. Being overweight. Not being active. Having a condition called insulin resistance. Having a history of: Prediabetes. Diabetes when pregnant. Polycystic ovarian syndrome (PCOS). What are the signs or symptoms? This condition may not cause symptoms. If you do have symptoms, they may include: Feeling more thirsty than normal. Needing to pee (urinate) more often than normal. Hunger. Feeling very tired. Blurry eyesight (vision). You may get other symptoms as the condition gets worse, such as: Dry mouth. Pain in your belly (abdomen). Not being hungry (loss of appetite). Breath that smells fruity. Weakness. Weight loss that is not planned. A tingling or numb feeling in your hands or feet. A headache. Cuts or bruises that heal slowly. How is this treated? Treatment depends on the cause of your condition. Treatment may include: Taking medicine to control your blood sugar levels. Changing your medicine or dosage if you take insulin or other diabetes medicines. Lifestyle changes. These may include: Exercising more. Eating healthier foods. Losing weight. Treating an illness or infection. Checking your blood sugar more often. Stopping or reducing steroid medicines. If your condition gets very bad, you will need to be treated in the hospital. Follow these instructions at home: General instructions Take over-the-counter and prescription medicines only as told by your doctor. Do not smoke or use any  products that contain nicotine or tobacco. If you need help quitting, ask your doctor. If you drink alcohol: Limit how much you have to: 0-1 drink a day for women who are not pregnant. 0-2 drinks a day for men. Know how much alcohol is in a drink. In the [place] one drink equals one 12 oz bottle of beer (355 mL), one 5 oz glass of wine (148 mL), or one 1 oz glass of hard liquor (44 mL). Manage stress. If you need help with this, ask your doctor. Do exercises as told by your doctor. Keep all follow-up visits. Eating and drinking  Stay at a healthy weight. Make sure you drink enough fluid when you: Exercise. Get sick. Are in hot temperatures. Drink enough fluid to keep your pee (urine) pale yellow.  If you have diabetes:  Know the symptoms of high blood sugar. Follow your diabetes management plan as told by your doctor. Make sure you: Take insulin and medicines as told. Follow your exercise plan. Follow your meal plan. Eat on time. Do not skip meals. Check your blood sugar as often as told. Make sure you check before and after exercise. If you exercise longer or in a different way, check your blood sugar more often. Follow your sick day plan whenever you cannot eat or drink normally. Make this plan ahead of time with your doctor. Share your diabetes management plan with people in your workplace, school, and household. Check  your pee for ketones when you are ill and as told by your doctor. Carry a card or wear jewelry that says that you have diabetes.  Where to find more information American Diabetes Association: www.diabetes.org Contact a doctor if: Your blood sugar level is at or above 240 mg/dL (13.3 mmol/L) for 2 days in a row. You have problems keeping your blood sugar in your target range. You have high blood pressure often. You have signs of illness, such as: Feeling like you may vomit (feeling nauseous). Vomiting. A fever. Get help right away if: Your blood sugar  monitor reads "high" even when you are taking insulin. You have trouble breathing. You have a change in how you think, feel, or act (mental status). You feel like you may vomit, and the feeling does not go away. You cannot stop vomiting. These symptoms may be an emergency. Get medical help right away. Call your local emergency services (911 in the [place] Do not wait to see if the symptoms will go away. Do not drive yourself to the hospital. Summary Hyperglycemia is when the sugar (glucose) level in your blood is too high. High blood sugar can happen to people who have or do not have diabetes. Make sure you drink enough fluids and follow your meal plan. Exercise as often as told by your doctor. Contact your doctor if you have problems keeping your blood sugar in your target range. This information is not intended to replace advice given to you by your health care provider. Make sure you discuss any questions you have with your healthcare provider. Document Revised: 06/13/2020 Document Reviewed: 06/13/2020 Elsevier Patient Education  2022 [employer].  Hypoglycemia Hypoglycemia is when the sugar (glucose) level in your blood is too low. Low blood sugar can happen to people who have diabetes and people who do not have diabetes. Low blood sugar can happenquickly, and it can be an emergency. What are the causes? This condition happens most often in people who have diabetes. It may be caused by: Diabetes medicine. Not eating enough, or not eating often enough. Doing more physical activity. Drinking alcohol on an empty stomach. If you do not have diabetes, this condition may be caused by: A tumor in the pancreas. Not eating enough, or not eating for long periods at a time (fasting). A very bad infection or illness. Problems after having weight loss (bariatric) surgery. Kidney failure or liver failure. Certain medicines. What increases the risk? This condition is more likely to develop in  people who: Have diabetes and take medicines to lower their blood sugar. Abuse alcohol. Have a very bad illness. What are the signs or symptoms? Mild Hunger. Sweating and feeling clammy. Feeling dizzy or light-headed. Being sleepy or having trouble sleeping. Feeling like you may vomit (nauseous). A fast heartbeat. A headache. Blurry vision. Mood changes, such as: Being grouchy. Feeling worried or nervous (anxious). Tingling or loss of feeling (numbness) around your mouth, lips, or tongue. Moderate Confusion and poor judgment. Behavior changes. Weakness. Uneven heartbeat. Trouble with moving (coordination). Very low Very low blood sugar (severe hypoglycemia) is a medical emergency. It can cause: Fainting. Seizures. Loss of consciousness (coma). Death. How is this treated? Treating low blood sugar Low blood sugar is often treated by eating or drinking something that has sugar in it right away. The food or drink should contain 15 grams of a fast-acting carb (carbohydrate). Options include: 4 oz (120 mL) of fruit juice. 4 oz (120 mL) of regular soda (not diet  soda). A few pieces of hard candy. Check food labels to see how many pieces to eat for 15 grams. 1 Tbsp (15 mL) of sugar or honey. 4 glucose tablets. 1 tube of glucose gel. Treating low blood sugar if you have diabetes If you can think clearly and swallow safely, follow the 15:15 rule: Take 15 grams of a fast-acting carb. Talk with your doctor about how much you should take. Always keep a source of fast-acting carb with you, such as: Glucose tablets (take 4 tablets). A few pieces of hard candy. Check food labels to see how many pieces to eat for 15 grams. 4 oz (120 mL) of fruit juice. 4 oz (120 mL) of regular soda (not diet soda). 1 Tbsp (15 mL) of honey or sugar. 1 tube of glucose gel. Check your blood sugar 15 minutes after you take the carb. If your blood sugar is still at or below 70 mg/dL (3.9 mmol/L), take 15  grams of a carb again. If your blood sugar does not go above 70 mg/dL (3.9 mmol/L) after 3 tries, get help right away. After your blood sugar goes back to normal, eat a meal or a snack within 1 hour.  Treating very low blood sugar If your blood sugar is below 54 mg/dL (3 mmol/L), you have very low blood sugar, or severe hypoglycemia. This is an emergency. Get medical help right away. If you have very low blood sugar and you cannot eat or drink, you will need to be given a hormone called glucagon. A family member or friend should learn how to check your blood sugar and how to give you glucagon. Ask your doctor if youneed to have an emergency glucagon kit at home. Very low blood sugar may also need to be treated in a hospital. Follow these instructions at home: General instructions Take over-the-counter and prescription medicines only as told by your doctor. Stay aware of your blood sugar as told by your doctor. If you drink alcohol: Limit how much you have to: 0-1 drink a day for women who are not pregnant. 0-2 drinks a day for men. Know how much alcohol is in your drink. In the [place] one drink equals one 12 oz bottle of beer (355 mL), one 5 oz glass of wine (148 mL), or one 1 oz glass of hard liquor (44 mL). Be sure to eat food when you drink alcohol. Know that your body absorbs alcohol quickly. This may lead to low blood sugar later. Be sure to keep checking your blood sugar. Keep all follow-up visits. If you have diabetes:  Always have a fast-acting carb (15 grams) with you to treat low blood sugar. Follow your diabetes care plan as told by your doctor. Make sure you: Know the symptoms of low blood sugar. Check your blood sugar as often as told. Always check it before and after exercise. Always check your blood sugar before you drive. Take your medicines as told. Follow your meal plan. Eat on time. Do not skip meals. Share your diabetes care plan with: Your work or school. People  you live with. Carry a card or wear jewelry that says you have diabetes.  Where to find more information American Diabetes Association: www.diabetes.org Contact a doctor if: You have trouble keeping your blood sugar in your target range. You have low blood sugar often. Get help right away if: You still have symptoms after you eat or drink something that contains 15 grams of fast-acting carb, and you cannot get  your blood sugar above 70 mg/dL by following the 15:15 rule. Your blood sugar is below 54 mg/dL (3 mmol/L). You have a seizure. You faint. These symptoms may be an emergency. Get help right away. Call your local emergency services (911 in the [place] Do not wait to see if the symptoms will go away. Do not drive yourself to the hospital. Summary Hypoglycemia happens when the level of sugar (glucose) in your blood is too low. Low blood sugar can happen to people who have diabetes and people who do not have diabetes. Low blood sugar can happen quickly, and it can be an emergency. Make sure you know the symptoms of low blood sugar and know how to treat it. Always keep a source of sugar (fast-acting carb) with you to treat low blood sugar. This information is not intended to replace advice given to you by your health care provider. Make sure you discuss any questions you have with your healthcare provider. Document Revised: 07/31/2020 Document Reviewed: 07/31/2020 Elsevier Patient Education  2022 [employer].

## 2021-03-09 NOTE — Progress Notes (Signed)
CBG taken per verbal order Cassie, NP. Result was 272.

## 2021-03-10 ENCOUNTER — Inpatient Hospital Stay: Payer: Medicare Other | Admitting: Dietician

## 2021-03-10 DIAGNOSIS — J449 Chronic obstructive pulmonary disease, unspecified: Secondary | ICD-10-CM | POA: Diagnosis not present

## 2021-03-10 DIAGNOSIS — R627 Adult failure to thrive: Secondary | ICD-10-CM | POA: Diagnosis not present

## 2021-03-10 DIAGNOSIS — C50412 Malignant neoplasm of upper-outer quadrant of left female breast: Secondary | ICD-10-CM | POA: Diagnosis not present

## 2021-03-10 DIAGNOSIS — E785 Hyperlipidemia, unspecified: Secondary | ICD-10-CM | POA: Diagnosis not present

## 2021-03-10 DIAGNOSIS — E876 Hypokalemia: Secondary | ICD-10-CM | POA: Diagnosis not present

## 2021-03-10 DIAGNOSIS — R413 Other amnesia: Secondary | ICD-10-CM | POA: Diagnosis not present

## 2021-03-10 DIAGNOSIS — E1169 Type 2 diabetes mellitus with other specified complication: Secondary | ICD-10-CM | POA: Diagnosis not present

## 2021-03-10 DIAGNOSIS — I10 Essential (primary) hypertension: Secondary | ICD-10-CM | POA: Diagnosis not present

## 2021-03-10 DIAGNOSIS — U071 COVID-19: Secondary | ICD-10-CM | POA: Diagnosis not present

## 2021-03-10 DIAGNOSIS — E1165 Type 2 diabetes mellitus with hyperglycemia: Secondary | ICD-10-CM | POA: Diagnosis not present

## 2021-03-10 DIAGNOSIS — D381 Neoplasm of uncertain behavior of trachea, bronchus and lung: Secondary | ICD-10-CM | POA: Diagnosis not present

## 2021-03-10 DIAGNOSIS — M1990 Unspecified osteoarthritis, unspecified site: Secondary | ICD-10-CM | POA: Diagnosis not present

## 2021-03-10 DIAGNOSIS — D630 Anemia in neoplastic disease: Secondary | ICD-10-CM | POA: Diagnosis not present

## 2021-03-10 LAB — GLUCOSE, CAPILLARY: Glucose-Capillary: 272 mg/dL — ABNORMAL HIGH (ref 70–99)

## 2021-03-10 NOTE — Progress Notes (Signed)
Nutrition  Patient did not show for scheduled nutrition appointment today. Will plan to see patient on Tuesday July 5 in infusion.

## 2021-03-11 ENCOUNTER — Ambulatory Visit: Payer: Medicare Other

## 2021-03-11 DIAGNOSIS — E785 Hyperlipidemia, unspecified: Secondary | ICD-10-CM | POA: Diagnosis not present

## 2021-03-11 DIAGNOSIS — E876 Hypokalemia: Secondary | ICD-10-CM | POA: Diagnosis not present

## 2021-03-11 DIAGNOSIS — E1165 Type 2 diabetes mellitus with hyperglycemia: Secondary | ICD-10-CM | POA: Diagnosis not present

## 2021-03-11 DIAGNOSIS — R413 Other amnesia: Secondary | ICD-10-CM | POA: Diagnosis not present

## 2021-03-11 DIAGNOSIS — I10 Essential (primary) hypertension: Secondary | ICD-10-CM | POA: Diagnosis not present

## 2021-03-11 DIAGNOSIS — M1990 Unspecified osteoarthritis, unspecified site: Secondary | ICD-10-CM | POA: Diagnosis not present

## 2021-03-11 DIAGNOSIS — R627 Adult failure to thrive: Secondary | ICD-10-CM | POA: Diagnosis not present

## 2021-03-11 DIAGNOSIS — C342 Malignant neoplasm of middle lobe, bronchus or lung: Secondary | ICD-10-CM | POA: Diagnosis not present

## 2021-03-11 DIAGNOSIS — D381 Neoplasm of uncertain behavior of trachea, bronchus and lung: Secondary | ICD-10-CM | POA: Diagnosis not present

## 2021-03-11 DIAGNOSIS — C50412 Malignant neoplasm of upper-outer quadrant of left female breast: Secondary | ICD-10-CM | POA: Diagnosis not present

## 2021-03-11 DIAGNOSIS — Z510 Encounter for antineoplastic radiation therapy: Secondary | ICD-10-CM | POA: Diagnosis not present

## 2021-03-11 DIAGNOSIS — D630 Anemia in neoplastic disease: Secondary | ICD-10-CM | POA: Diagnosis not present

## 2021-03-11 DIAGNOSIS — E1169 Type 2 diabetes mellitus with other specified complication: Secondary | ICD-10-CM | POA: Diagnosis not present

## 2021-03-11 DIAGNOSIS — J449 Chronic obstructive pulmonary disease, unspecified: Secondary | ICD-10-CM | POA: Diagnosis not present

## 2021-03-11 DIAGNOSIS — U071 COVID-19: Secondary | ICD-10-CM | POA: Diagnosis not present

## 2021-03-12 ENCOUNTER — Other Ambulatory Visit: Payer: Self-pay

## 2021-03-12 ENCOUNTER — Ambulatory Visit
Admission: RE | Admit: 2021-03-12 | Discharge: 2021-03-12 | Disposition: A | Discharge status: Home / Self Care | Payer: Medicare Other | Source: Ambulatory Visit | Attending: Radiation Oncology | Admitting: Radiation Oncology

## 2021-03-12 DIAGNOSIS — C342 Malignant neoplasm of middle lobe, bronchus or lung: Secondary | ICD-10-CM | POA: Diagnosis not present

## 2021-03-12 DIAGNOSIS — Z510 Encounter for antineoplastic radiation therapy: Secondary | ICD-10-CM | POA: Diagnosis not present

## 2021-03-13 ENCOUNTER — Ambulatory Visit
Admission: RE | Admit: 2021-03-13 | Discharge: 2021-03-13 | Disposition: A | Discharge status: Home / Self Care | Payer: Medicare Other | Source: Ambulatory Visit | Attending: Radiation Oncology | Admitting: Radiation Oncology

## 2021-03-13 ENCOUNTER — Ambulatory Visit: Payer: Medicare Other | Admitting: Emergency Medicine

## 2021-03-13 DIAGNOSIS — C342 Malignant neoplasm of middle lobe, bronchus or lung: Secondary | ICD-10-CM | POA: Diagnosis not present

## 2021-03-13 DIAGNOSIS — C3491 Malignant neoplasm of unspecified part of right bronchus or lung: Secondary | ICD-10-CM | POA: Insufficient documentation

## 2021-03-13 MED ORDER — SONAFINE EX EMUL
1.0000 "application " | Freq: Once | CUTANEOUS | Status: AC
  Administered 2021-03-13: 1 via TOPICAL

## 2021-03-13 NOTE — Progress Notes (Signed)
Pt here for patient teaching.    Pt given Radiation and You booklet, skin care instructions, and Sonafine.    Reviewed areas of pertinence such as fatigue, hair loss, mouth changes, skin changes, throat changes, cough, shortness of breath, and taste changes .   Pt able to give teach back of to pat skin, use unscented/gentle soap, and use baby wipes,apply Sonafine bid and avoid applying anything to skin within 4 hours of treatment.   Pt verbalizes understanding of information given and will contact nursing with any questions or concerns.    [email or web address]

## 2021-03-17 ENCOUNTER — Other Ambulatory Visit: Payer: Self-pay | Admitting: Physician Assistant

## 2021-03-17 ENCOUNTER — Other Ambulatory Visit: Payer: Self-pay

## 2021-03-17 ENCOUNTER — Inpatient Hospital Stay: Payer: Medicare Other | Admitting: Dietician

## 2021-03-17 ENCOUNTER — Inpatient Hospital Stay: Payer: Medicare Other

## 2021-03-17 ENCOUNTER — Inpatient Hospital Stay: Payer: Medicare Other | Attending: Internal Medicine

## 2021-03-17 ENCOUNTER — Ambulatory Visit
Admission: RE | Admit: 2021-03-17 | Discharge: 2021-03-17 | Disposition: A | Discharge status: Home / Self Care | Payer: Medicare Other | Source: Ambulatory Visit | Attending: Radiation Oncology | Admitting: Radiation Oncology

## 2021-03-17 VITALS — BP 138/68 | HR 93 | Temp 98.6°F | Resp 16 | Wt 115.0 lb

## 2021-03-17 DIAGNOSIS — C3491 Malignant neoplasm of unspecified part of right bronchus or lung: Secondary | ICD-10-CM

## 2021-03-17 DIAGNOSIS — Z79899 Other long term (current) drug therapy: Secondary | ICD-10-CM | POA: Diagnosis not present

## 2021-03-17 DIAGNOSIS — E119 Type 2 diabetes mellitus without complications: Secondary | ICD-10-CM | POA: Insufficient documentation

## 2021-03-17 DIAGNOSIS — J449 Chronic obstructive pulmonary disease, unspecified: Secondary | ICD-10-CM | POA: Diagnosis not present

## 2021-03-17 DIAGNOSIS — R498 Other voice and resonance disorders: Secondary | ICD-10-CM | POA: Insufficient documentation

## 2021-03-17 DIAGNOSIS — R110 Nausea: Secondary | ICD-10-CM | POA: Insufficient documentation

## 2021-03-17 DIAGNOSIS — Z5111 Encounter for antineoplastic chemotherapy: Secondary | ICD-10-CM | POA: Diagnosis not present

## 2021-03-17 DIAGNOSIS — I10 Essential (primary) hypertension: Secondary | ICD-10-CM | POA: Insufficient documentation

## 2021-03-17 DIAGNOSIS — G893 Neoplasm related pain (acute) (chronic): Secondary | ICD-10-CM

## 2021-03-17 DIAGNOSIS — R0609 Other forms of dyspnea: Secondary | ICD-10-CM | POA: Diagnosis not present

## 2021-03-17 DIAGNOSIS — C342 Malignant neoplasm of middle lobe, bronchus or lung: Secondary | ICD-10-CM | POA: Insufficient documentation

## 2021-03-17 LAB — CBC WITH DIFFERENTIAL (CANCER CENTER ONLY)
Abs Immature Granulocytes: 0.02 K/uL (ref 0.00–0.07)
Basophils Absolute: 0 K/uL (ref 0.0–0.1)
Basophils Relative: 1 %
Eosinophils Absolute: 0.1 K/uL (ref 0.0–0.5)
Eosinophils Relative: 3 %
HCT: 31.3 % — ABNORMAL LOW (ref 36.0–46.0)
Hemoglobin: 10.2 g/dL — ABNORMAL LOW (ref 12.0–15.0)
Immature Granulocytes: 1 %
Lymphocytes Relative: 39 %
Lymphs Abs: 1.4 K/uL (ref 0.7–4.0)
MCH: 28.1 pg (ref 26.0–34.0)
MCHC: 32.6 g/dL (ref 30.0–36.0)
MCV: 86.2 fL (ref 80.0–100.0)
Monocytes Absolute: 0.2 K/uL (ref 0.1–1.0)
Monocytes Relative: 7 %
Neutro Abs: 1.9 K/uL (ref 1.7–7.7)
Neutrophils Relative %: 49 %
Platelet Count: 337 K/uL (ref 150–400)
RBC: 3.63 MIL/uL — ABNORMAL LOW (ref 3.87–5.11)
RDW: 16.4 % — ABNORMAL HIGH (ref 11.5–15.5)
WBC Count: 3.7 K/uL — ABNORMAL LOW (ref 4.0–10.5)
nRBC: 0 % (ref 0.0–0.2)

## 2021-03-17 LAB — CMP (CANCER CENTER ONLY)
ALT: 7 U/L (ref 0–44)
AST: 13 U/L — ABNORMAL LOW (ref 15–41)
Albumin: 2.9 g/dL — ABNORMAL LOW (ref 3.5–5.0)
Alkaline Phosphatase: 102 U/L (ref 38–126)
Anion gap: 9 (ref 5–15)
BUN: 19 mg/dL (ref 8–23)
CO2: 28 mmol/L (ref 22–32)
Calcium: 9.6 mg/dL (ref 8.9–10.3)
Chloride: 96 mmol/L — ABNORMAL LOW (ref 98–111)
Creatinine: 1.01 mg/dL — ABNORMAL HIGH (ref 0.44–1.00)
GFR, Estimated: 57 mL/min — ABNORMAL LOW (ref >60)
Glucose, Bld: 266 mg/dL — ABNORMAL HIGH (ref 70–99)
Potassium: 4 mmol/L (ref 3.5–5.1)
Sodium: 133 mmol/L — ABNORMAL LOW (ref 135–145)
Total Bilirubin: 0.4 mg/dL (ref 0.3–1.2)
Total Protein: 6.3 g/dL — ABNORMAL LOW (ref 6.5–8.1)

## 2021-03-17 MED ORDER — SODIUM CHLORIDE 0.9 % IV SOLN
110.0000 mg | Freq: Once | INTRAVENOUS | Status: AC
  Administered 2021-03-17: 110 mg via INTRAVENOUS
  Filled 2021-03-17: qty 11

## 2021-03-17 MED ORDER — DEXAMETHASONE SODIUM PHOSPHATE 100 MG/10ML IJ SOLN
20.0000 mg | Freq: Once | INTRAMUSCULAR | Status: AC
  Administered 2021-03-17: 20 mg via INTRAVENOUS
  Filled 2021-03-17: qty 20
  Filled 2021-03-17: qty 2

## 2021-03-17 MED ORDER — SODIUM CHLORIDE 0.9 % IV SOLN
Freq: Once | INTRAVENOUS | Status: AC
Start: 2021-03-17 — End: 2021-03-17
  Filled 2021-03-17: qty 250

## 2021-03-17 MED ORDER — FAMOTIDINE 20 MG IN NS 100 ML IVPB
20.0000 mg | Freq: Once | INTRAVENOUS | Status: AC
Start: 2021-03-17 — End: 2021-03-17
  Administered 2021-03-17: 20 mg via INTRAVENOUS

## 2021-03-17 MED ORDER — SODIUM CHLORIDE 0.9 % IV SOLN
45.0000 mg/m2 | Freq: Once | INTRAVENOUS | Status: AC
  Administered 2021-03-17: 72 mg via INTRAVENOUS
  Filled 2021-03-17: qty 12

## 2021-03-17 MED ORDER — ACETAMINOPHEN 325 MG PO TABS
650.0000 mg | ORAL_TABLET | Freq: Once | ORAL | Status: AC
Start: 2021-03-17 — End: 2021-03-17
  Administered 2021-03-17: 650 mg via ORAL

## 2021-03-17 MED ORDER — PALONOSETRON HCL INJECTION 0.25 MG/5ML
INTRAVENOUS | Status: AC
  Filled 2021-03-17: qty 5

## 2021-03-17 MED ORDER — FAMOTIDINE 20 MG IN NS 100 ML IVPB
INTRAVENOUS | Status: AC
  Filled 2021-03-17: qty 100

## 2021-03-17 MED ORDER — DIPHENHYDRAMINE HCL 50 MG/ML IJ SOLN
INTRAMUSCULAR | Status: AC
  Filled 2021-03-17: qty 1

## 2021-03-17 MED ORDER — DIPHENHYDRAMINE HCL 50 MG/ML IJ SOLN
50.0000 mg | Freq: Once | INTRAMUSCULAR | Status: AC
  Administered 2021-03-17: 50 mg via INTRAVENOUS

## 2021-03-17 MED ORDER — ACETAMINOPHEN 325 MG PO TABS
ORAL_TABLET | ORAL | Status: AC
  Filled 2021-03-17: qty 2

## 2021-03-17 MED ORDER — PALONOSETRON HCL INJECTION 0.25 MG/5ML
0.2500 mg | Freq: Once | INTRAVENOUS | Status: AC
  Administered 2021-03-17: 0.25 mg via INTRAVENOUS

## 2021-03-17 NOTE — Progress Notes (Signed)
Dr. Julien Nordmann would like to continue Carboplatin dose of 110 mg.  Larene Beach, PharmD

## 2021-03-17 NOTE — Patient Instructions (Signed)
[facility]  Discharge Instructions: Thank you for choosing [facility] to provide your oncology and hematology care.   If you have a lab appointment with the [facility], please go directly to the [facility] and check in at the registration area.   Wear comfortable clothing and clothing appropriate for easy access to any Portacath or PICC line.   We strive to give you quality time with your provider. You may need to reschedule your appointment if you arrive late (15 or more minutes).  Arriving late affects you and other patients whose appointments are after yours.  Also, if you miss three or more appointments without notifying the office, you may be dismissed from the clinic at the provider's discretion.      For prescription refill requests, have your pharmacy contact our office and allow 72 hours for refills to be completed.    Today you received the following chemotherapy and/or immunotherapy agents : Paclitaxel, Carboplatin     To help prevent nausea and vomiting after your treatment, we encourage you to take your nausea medication as directed.  BELOW ARE SYMPTOMS THAT SHOULD BE REPORTED IMMEDIATELY: *FEVER GREATER THAN 100.4 F (38 C) OR HIGHER *CHILLS OR SWEATING *NAUSEA AND VOMITING THAT IS NOT CONTROLLED WITH YOUR NAUSEA MEDICATION *UNUSUAL SHORTNESS OF BREATH *UNUSUAL BRUISING OR BLEEDING *URINARY PROBLEMS (pain or burning when urinating, or frequent urination) *BOWEL PROBLEMS (unusual diarrhea, constipation, pain near the anus) TENDERNESS IN MOUTH AND THROAT WITH OR WITHOUT PRESENCE OF ULCERS (sore throat, sores in mouth, or a toothache) UNUSUAL RASH, SWELLING OR PAIN  UNUSUAL VAGINAL DISCHARGE OR ITCHING   Items with * indicate a potential emergency and should be followed up as soon as possible or go to the Emergency Department if any problems should occur.  Please show the CHEMOTHERAPY ALERT CARD or IMMUNOTHERAPY ALERT CARD  at check-in to the Emergency Department and triage nurse.  Should you have questions after your visit or need to cancel or reschedule your appointment, please contact [facility]  Dept: [phone]  and follow the prompts.  Office hours are 8:00 a.m. to 4:30 p.m. Monday - Friday. Please note that voicemails left after 4:00 p.m. may not be returned until the following business day.  We are closed weekends and major holidays. You have access to a nurse at all times for urgent questions. Please call the main number to the clinic Dept: [phone] and follow the prompts.   For any non-urgent questions, you may also contact your provider using MyChart. We now offer e-Visits for anyone 28 and older to request care online for non-urgent symptoms. For details visit [email or web address].   Also download the MyChart app! Go to the app store, search "MyChart", open the app, select [facility], and log in with your MyChart username and password.  Due to Covid, a mask is required upon entering the hospital/clinic. If you do not have a mask, one will be given to you upon arrival. For doctor visits, patients may have 1 support person aged 59 or older with them. For treatment visits, patients cannot have anyone with them due to current Covid guidelines and our immunocompromised population.   Hyperglycemia Hyperglycemia is when the sugar (glucose) level in your blood is too high. High blood sugar can happen to people who have or do not have diabetes. High blood sugar can happen quickly. It can be anemergency. What are the causes? If you have diabetes, high blood  sugar may be caused by: Medicines that increase blood sugar or affect your control of diabetes. Getting less physical activity. Overeating. Being sick or injured or having an infection. Having surgery. Stress. Not giving yourself enough insulin (if you are taking it). You may have high blood sugar because you have  diabetes that has not beendiagnosed yet. If you do not have diabetes, high blood sugar may be caused by: Certain medicines. Stress. A bad illness. An infection. Having surgery. Diseases of the pancreas. What increases the risk? This condition is more likely to develop in people who have risk factors for diabetes, such as: Having a family member with diabetes. Certain conditions in which the body's defense system (immune system) attacks itself. These are called autoimmune disorders. Being overweight. Not being active. Having a condition called insulin resistance. Having a history of: Prediabetes. Diabetes when pregnant. Polycystic ovarian syndrome (PCOS). What are the signs or symptoms? This condition may not cause symptoms. If you do have symptoms, they may include: Feeling more thirsty than normal. Needing to pee (urinate) more often than normal. Hunger. Feeling very tired. Blurry eyesight (vision). You may get other symptoms as the condition gets worse, such as: Dry mouth. Pain in your belly (abdomen). Not being hungry (loss of appetite). Breath that smells fruity. Weakness. Weight loss that is not planned. A tingling or numb feeling in your hands or feet. A headache. Cuts or bruises that heal slowly. How is this treated? Treatment depends on the cause of your condition. Treatment may include: Taking medicine to control your blood sugar levels. Changing your medicine or dosage if you take insulin or other diabetes medicines. Lifestyle changes. These may include: Exercising more. Eating healthier foods. Losing weight. Treating an illness or infection. Checking your blood sugar more often. Stopping or reducing steroid medicines. If your condition gets very bad, you will need to be treated in the hospital. Follow these instructions at home: General instructions Take over-the-counter and prescription medicines only as told by your doctor. Do not smoke or use any  products that contain nicotine or tobacco. If you need help quitting, ask your doctor. If you drink alcohol: Limit how much you have to: 0-1 drink a day for women who are not pregnant. 0-2 drinks a day for men. Know how much alcohol is in a drink. In the [place] one drink equals one 12 oz bottle of beer (355 mL), one 5 oz glass of wine (148 mL), or one 1 oz glass of hard liquor (44 mL). Manage stress. If you need help with this, ask your doctor. Do exercises as told by your doctor. Keep all follow-up visits. Eating and drinking  Stay at a healthy weight. Make sure you drink enough fluid when you: Exercise. Get sick. Are in hot temperatures. Drink enough fluid to keep your pee (urine) pale yellow.  If you have diabetes:  Know the symptoms of high blood sugar. Follow your diabetes management plan as told by your doctor. Make sure you: Take insulin and medicines as told. Follow your exercise plan. Follow your meal plan. Eat on time. Do not skip meals. Check your blood sugar as often as told. Make sure you check before and after exercise. If you exercise longer or in a different way, check your blood sugar more often. Follow your sick day plan whenever you cannot eat or drink normally. Make this plan ahead of time with your doctor. Share your diabetes management plan with people in your workplace, school, and household. Check  your pee for ketones when you are ill and as told by your doctor. Carry a card or wear jewelry that says that you have diabetes.  Where to find more information American Diabetes Association: www.diabetes.org Contact a doctor if: Your blood sugar level is at or above 240 mg/dL (13.3 mmol/L) for 2 days in a row. You have problems keeping your blood sugar in your target range. You have high blood pressure often. You have signs of illness, such as: Feeling like you may vomit (feeling nauseous). Vomiting. A fever. Get help right away if: Your blood sugar  monitor reads "high" even when you are taking insulin. You have trouble breathing. You have a change in how you think, feel, or act (mental status). You feel like you may vomit, and the feeling does not go away. You cannot stop vomiting. These symptoms may be an emergency. Get medical help right away. Call your local emergency services (911 in the [place] Do not wait to see if the symptoms will go away. Do not drive yourself to the hospital. Summary Hyperglycemia is when the sugar (glucose) level in your blood is too high. High blood sugar can happen to people who have or do not have diabetes. Make sure you drink enough fluids and follow your meal plan. Exercise as often as told by your doctor. Contact your doctor if you have problems keeping your blood sugar in your target range. This information is not intended to replace advice given to you by your health care provider. Make sure you discuss any questions you have with your healthcare provider. Document Revised: 06/13/2020 Document Reviewed: 06/13/2020 Elsevier Patient Education  2022 [employer].  Hypoglycemia Hypoglycemia is when the sugar (glucose) level in your blood is too low. Low blood sugar can happen to people who have diabetes and people who do not have diabetes. Low blood sugar can happenquickly, and it can be an emergency. What are the causes? This condition happens most often in people who have diabetes. It may be caused by: Diabetes medicine. Not eating enough, or not eating often enough. Doing more physical activity. Drinking alcohol on an empty stomach. If you do not have diabetes, this condition may be caused by: A tumor in the pancreas. Not eating enough, or not eating for long periods at a time (fasting). A very bad infection or illness. Problems after having weight loss (bariatric) surgery. Kidney failure or liver failure. Certain medicines. What increases the risk? This condition is more likely to develop in  people who: Have diabetes and take medicines to lower their blood sugar. Abuse alcohol. Have a very bad illness. What are the signs or symptoms? Mild Hunger. Sweating and feeling clammy. Feeling dizzy or light-headed. Being sleepy or having trouble sleeping. Feeling like you may vomit (nauseous). A fast heartbeat. A headache. Blurry vision. Mood changes, such as: Being grouchy. Feeling worried or nervous (anxious). Tingling or loss of feeling (numbness) around your mouth, lips, or tongue. Moderate Confusion and poor judgment. Behavior changes. Weakness. Uneven heartbeat. Trouble with moving (coordination). Very low Very low blood sugar (severe hypoglycemia) is a medical emergency. It can cause: Fainting. Seizures. Loss of consciousness (coma). Death. How is this treated? Treating low blood sugar Low blood sugar is often treated by eating or drinking something that has sugar in it right away. The food or drink should contain 15 grams of a fast-acting carb (carbohydrate). Options include: 4 oz (120 mL) of fruit juice. 4 oz (120 mL) of regular soda (not diet  soda). A few pieces of hard candy. Check food labels to see how many pieces to eat for 15 grams. 1 Tbsp (15 mL) of sugar or honey. 4 glucose tablets. 1 tube of glucose gel. Treating low blood sugar if you have diabetes If you can think clearly and swallow safely, follow the 15:15 rule: Take 15 grams of a fast-acting carb. Talk with your doctor about how much you should take. Always keep a source of fast-acting carb with you, such as: Glucose tablets (take 4 tablets). A few pieces of hard candy. Check food labels to see how many pieces to eat for 15 grams. 4 oz (120 mL) of fruit juice. 4 oz (120 mL) of regular soda (not diet soda). 1 Tbsp (15 mL) of honey or sugar. 1 tube of glucose gel. Check your blood sugar 15 minutes after you take the carb. If your blood sugar is still at or below 70 mg/dL (3.9 mmol/L), take 15  grams of a carb again. If your blood sugar does not go above 70 mg/dL (3.9 mmol/L) after 3 tries, get help right away. After your blood sugar goes back to normal, eat a meal or a snack within 1 hour.  Treating very low blood sugar If your blood sugar is below 54 mg/dL (3 mmol/L), you have very low blood sugar, or severe hypoglycemia. This is an emergency. Get medical help right away. If you have very low blood sugar and you cannot eat or drink, you will need to be given a hormone called glucagon. A family member or friend should learn how to check your blood sugar and how to give you glucagon. Ask your doctor if youneed to have an emergency glucagon kit at home. Very low blood sugar may also need to be treated in a hospital. Follow these instructions at home: General instructions Take over-the-counter and prescription medicines only as told by your doctor. Stay aware of your blood sugar as told by your doctor. If you drink alcohol: Limit how much you have to: 0-1 drink a day for women who are not pregnant. 0-2 drinks a day for men. Know how much alcohol is in your drink. In the [place] one drink equals one 12 oz bottle of beer (355 mL), one 5 oz glass of wine (148 mL), or one 1 oz glass of hard liquor (44 mL). Be sure to eat food when you drink alcohol. Know that your body absorbs alcohol quickly. This may lead to low blood sugar later. Be sure to keep checking your blood sugar. Keep all follow-up visits. If you have diabetes:  Always have a fast-acting carb (15 grams) with you to treat low blood sugar. Follow your diabetes care plan as told by your doctor. Make sure you: Know the symptoms of low blood sugar. Check your blood sugar as often as told. Always check it before and after exercise. Always check your blood sugar before you drive. Take your medicines as told. Follow your meal plan. Eat on time. Do not skip meals. Share your diabetes care plan with: Your work or school. People  you live with. Carry a card or wear jewelry that says you have diabetes.  Where to find more information American Diabetes Association: www.diabetes.org Contact a doctor if: You have trouble keeping your blood sugar in your target range. You have low blood sugar often. Get help right away if: You still have symptoms after you eat or drink something that contains 15 grams of fast-acting carb, and you cannot get  your blood sugar above 70 mg/dL by following the 15:15 rule. Your blood sugar is below 54 mg/dL (3 mmol/L). You have a seizure. You faint. These symptoms may be an emergency. Get help right away. Call your local emergency services (911 in the [place] Do not wait to see if the symptoms will go away. Do not drive yourself to the hospital. Summary Hypoglycemia happens when the level of sugar (glucose) in your blood is too low. Low blood sugar can happen to people who have diabetes and people who do not have diabetes. Low blood sugar can happen quickly, and it can be an emergency. Make sure you know the symptoms of low blood sugar and know how to treat it. Always keep a source of sugar (fast-acting carb) with you to treat low blood sugar. This information is not intended to replace advice given to you by your health care provider. Make sure you discuss any questions you have with your healthcare provider. Document Revised: 07/31/2020 Document Reviewed: 07/31/2020 Elsevier Patient Education  2022 [employer].

## 2021-03-17 NOTE — Progress Notes (Signed)
Nutrition Assessment   Reason for Assessment: MST   ASSESSMENT: 80 year old female with stage IIIb non-small cell lung cancer. She is receiving concurrent chemoradiation therapy with carboplatin.  Past medical history of breast cancer s/p radiation therapy (2017), COPD, DM2, HTN, HLD, full dentures, HOH, and recent Covid-19 infection in May.  Met with patient in infusion, she is complaining of forearm discomfort at site of IV placement. Nurse has placed heat pack, says this is helping some. Patient has dementia, she is a poor historian. She reports she has a good appetite, unable to recall if she had eaten today. Spoke with son via telephone after meeting with patient. He denies nutrition impact symptoms, reports patient is tolerating treatment well. Son reports patient eats a good breakfast every morning, recalls 1/2 banana, 1-2 eggs, couple pieces of bacon, small glass of juice and coffee. He reports intake of lunch and dinner meals vary. She likes spaghetti. Son reports "he messed up" says he was not thinking about food with sugar and allowing her to drink soda and eat ice cream when she requested. Son says he no longer gives her toast with breakfast, sometimes will allow her a clementine, and has her choice of 6 oz soda or small bowl of ice cream on Fridays. Son reports patient is drinking 1 Ensure/day.    Medications: Anastrozole, D3, Metformin, Protonix, Compazine, Crestor   Labs: Na 133, Glucose 266, Cr 1.01   Anthropometrics: Weight 115 lb today increased from 110 lb 14.4 oz on 6/27 decreased from 117 lb 4.8 oz on 6/9  Height: 5'7" Weight: 52.2 kg UBW: 126 lb (Sept 2021) BMI: 18.01    NUTRITION DIAGNOSIS: Unintentional weight loss related to cancer and related treatments as evidenced by 8.7% decrease from usual weight over the last 9 months. This is insignificant for time frame, however concerning    INTERVENTION:  Educated on strategies for blood glucose management (small  frequent balanced meals/snacks, limiting sugary beverages, not skipping meals, eating whole fruits vs drinking juice, switching to whole grain breads/pasta), will mail handout Discussed foods with protein, educated to have protein source with all meals/snacks, will mail handout with snack ideas Discussed strategies for poor appetite, will mail handout Will mail reduced-sugar recipes Continue drinking Ensure daily, will mail coupons RD contact information provided   MONITORING, EVALUATION, GOAL:  Patient will tolerate increased calories and protein to prevent further weight loss    Next Visit: Wednesday July 27 via telephone with son

## 2021-03-18 ENCOUNTER — Ambulatory Visit
Admission: RE | Admit: 2021-03-18 | Discharge: 2021-03-18 | Disposition: A | Discharge status: Home / Self Care | Payer: Medicare Other | Source: Ambulatory Visit | Attending: Radiation Oncology | Admitting: Radiation Oncology

## 2021-03-18 DIAGNOSIS — D630 Anemia in neoplastic disease: Secondary | ICD-10-CM | POA: Diagnosis not present

## 2021-03-18 DIAGNOSIS — E1169 Type 2 diabetes mellitus with other specified complication: Secondary | ICD-10-CM | POA: Diagnosis not present

## 2021-03-18 DIAGNOSIS — R413 Other amnesia: Secondary | ICD-10-CM | POA: Diagnosis not present

## 2021-03-18 DIAGNOSIS — R627 Adult failure to thrive: Secondary | ICD-10-CM | POA: Diagnosis not present

## 2021-03-18 DIAGNOSIS — E876 Hypokalemia: Secondary | ICD-10-CM | POA: Diagnosis not present

## 2021-03-18 DIAGNOSIS — C342 Malignant neoplasm of middle lobe, bronchus or lung: Secondary | ICD-10-CM | POA: Diagnosis not present

## 2021-03-18 DIAGNOSIS — U071 COVID-19: Secondary | ICD-10-CM | POA: Diagnosis not present

## 2021-03-18 DIAGNOSIS — C50412 Malignant neoplasm of upper-outer quadrant of left female breast: Secondary | ICD-10-CM | POA: Diagnosis not present

## 2021-03-18 DIAGNOSIS — D381 Neoplasm of uncertain behavior of trachea, bronchus and lung: Secondary | ICD-10-CM | POA: Diagnosis not present

## 2021-03-18 DIAGNOSIS — I10 Essential (primary) hypertension: Secondary | ICD-10-CM | POA: Diagnosis not present

## 2021-03-18 DIAGNOSIS — E785 Hyperlipidemia, unspecified: Secondary | ICD-10-CM | POA: Diagnosis not present

## 2021-03-18 DIAGNOSIS — E1165 Type 2 diabetes mellitus with hyperglycemia: Secondary | ICD-10-CM | POA: Diagnosis not present

## 2021-03-18 DIAGNOSIS — J449 Chronic obstructive pulmonary disease, unspecified: Secondary | ICD-10-CM | POA: Diagnosis not present

## 2021-03-18 DIAGNOSIS — M1990 Unspecified osteoarthritis, unspecified site: Secondary | ICD-10-CM | POA: Diagnosis not present

## 2021-03-18 DIAGNOSIS — C3491 Malignant neoplasm of unspecified part of right bronchus or lung: Secondary | ICD-10-CM | POA: Diagnosis not present

## 2021-03-19 ENCOUNTER — Ambulatory Visit
Admission: RE | Admit: 2021-03-19 | Discharge: 2021-03-19 | Disposition: A | Discharge status: Home / Self Care | Payer: Medicare Other | Source: Ambulatory Visit | Attending: Radiation Oncology | Admitting: Radiation Oncology

## 2021-03-19 ENCOUNTER — Other Ambulatory Visit: Payer: Self-pay

## 2021-03-19 DIAGNOSIS — C3491 Malignant neoplasm of unspecified part of right bronchus or lung: Secondary | ICD-10-CM | POA: Diagnosis not present

## 2021-03-19 DIAGNOSIS — C342 Malignant neoplasm of middle lobe, bronchus or lung: Secondary | ICD-10-CM | POA: Diagnosis not present

## 2021-03-20 ENCOUNTER — Ambulatory Visit
Admission: RE | Admit: 2021-03-20 | Discharge: 2021-03-20 | Disposition: A | Discharge status: Home / Self Care | Payer: Medicare Other | Source: Ambulatory Visit | Attending: Radiation Oncology | Admitting: Radiation Oncology

## 2021-03-20 DIAGNOSIS — C342 Malignant neoplasm of middle lobe, bronchus or lung: Secondary | ICD-10-CM | POA: Diagnosis not present

## 2021-03-20 DIAGNOSIS — E1165 Type 2 diabetes mellitus with hyperglycemia: Secondary | ICD-10-CM | POA: Diagnosis not present

## 2021-03-20 DIAGNOSIS — D630 Anemia in neoplastic disease: Secondary | ICD-10-CM | POA: Diagnosis not present

## 2021-03-20 DIAGNOSIS — E785 Hyperlipidemia, unspecified: Secondary | ICD-10-CM | POA: Diagnosis not present

## 2021-03-20 DIAGNOSIS — R627 Adult failure to thrive: Secondary | ICD-10-CM | POA: Diagnosis not present

## 2021-03-20 DIAGNOSIS — E1169 Type 2 diabetes mellitus with other specified complication: Secondary | ICD-10-CM | POA: Diagnosis not present

## 2021-03-20 DIAGNOSIS — I10 Essential (primary) hypertension: Secondary | ICD-10-CM | POA: Diagnosis not present

## 2021-03-20 DIAGNOSIS — D381 Neoplasm of uncertain behavior of trachea, bronchus and lung: Secondary | ICD-10-CM | POA: Diagnosis not present

## 2021-03-20 DIAGNOSIS — M1990 Unspecified osteoarthritis, unspecified site: Secondary | ICD-10-CM | POA: Diagnosis not present

## 2021-03-20 DIAGNOSIS — E876 Hypokalemia: Secondary | ICD-10-CM | POA: Diagnosis not present

## 2021-03-20 DIAGNOSIS — J449 Chronic obstructive pulmonary disease, unspecified: Secondary | ICD-10-CM | POA: Diagnosis not present

## 2021-03-20 DIAGNOSIS — C50412 Malignant neoplasm of upper-outer quadrant of left female breast: Secondary | ICD-10-CM | POA: Diagnosis not present

## 2021-03-20 DIAGNOSIS — R413 Other amnesia: Secondary | ICD-10-CM | POA: Diagnosis not present

## 2021-03-20 DIAGNOSIS — C3491 Malignant neoplasm of unspecified part of right bronchus or lung: Secondary | ICD-10-CM | POA: Diagnosis not present

## 2021-03-20 DIAGNOSIS — U071 COVID-19: Secondary | ICD-10-CM | POA: Diagnosis not present

## 2021-03-23 ENCOUNTER — Inpatient Hospital Stay: Payer: Medicare Other

## 2021-03-23 ENCOUNTER — Inpatient Hospital Stay (HOSPITAL_BASED_OUTPATIENT_CLINIC_OR_DEPARTMENT_OTHER): Payer: Medicare Other | Admitting: Internal Medicine

## 2021-03-23 ENCOUNTER — Other Ambulatory Visit: Payer: Self-pay

## 2021-03-23 ENCOUNTER — Encounter: Payer: Self-pay | Admitting: Internal Medicine

## 2021-03-23 ENCOUNTER — Ambulatory Visit
Admission: RE | Admit: 2021-03-23 | Discharge: 2021-03-23 | Disposition: A | Discharge status: Home / Self Care | Payer: Medicare Other | Source: Ambulatory Visit | Attending: Radiation Oncology | Admitting: Radiation Oncology

## 2021-03-23 VITALS — BP 137/70 | HR 97 | Temp 98.4°F | Resp 18 | Ht 67.0 in | Wt 114.3 lb

## 2021-03-23 DIAGNOSIS — Z5111 Encounter for antineoplastic chemotherapy: Secondary | ICD-10-CM

## 2021-03-23 DIAGNOSIS — Z79899 Other long term (current) drug therapy: Secondary | ICD-10-CM | POA: Diagnosis not present

## 2021-03-23 DIAGNOSIS — C3491 Malignant neoplasm of unspecified part of right bronchus or lung: Secondary | ICD-10-CM

## 2021-03-23 DIAGNOSIS — E119 Type 2 diabetes mellitus without complications: Secondary | ICD-10-CM | POA: Diagnosis not present

## 2021-03-23 DIAGNOSIS — R110 Nausea: Secondary | ICD-10-CM | POA: Diagnosis not present

## 2021-03-23 DIAGNOSIS — R498 Other voice and resonance disorders: Secondary | ICD-10-CM | POA: Diagnosis not present

## 2021-03-23 DIAGNOSIS — R0609 Other forms of dyspnea: Secondary | ICD-10-CM | POA: Diagnosis not present

## 2021-03-23 DIAGNOSIS — I10 Essential (primary) hypertension: Secondary | ICD-10-CM | POA: Diagnosis not present

## 2021-03-23 DIAGNOSIS — F17210 Nicotine dependence, cigarettes, uncomplicated: Secondary | ICD-10-CM | POA: Diagnosis not present

## 2021-03-23 DIAGNOSIS — J449 Chronic obstructive pulmonary disease, unspecified: Secondary | ICD-10-CM | POA: Diagnosis not present

## 2021-03-23 DIAGNOSIS — C342 Malignant neoplasm of middle lobe, bronchus or lung: Secondary | ICD-10-CM | POA: Diagnosis not present

## 2021-03-23 LAB — CBC WITH DIFFERENTIAL (CANCER CENTER ONLY)
Abs Immature Granulocytes: 0.01 10*3/uL (ref 0.00–0.07)
Basophils Absolute: 0 10*3/uL (ref 0.0–0.1)
Basophils Relative: 1 %
Eosinophils Absolute: 0.1 10*3/uL (ref 0.0–0.5)
Eosinophils Relative: 3 %
HCT: 30.6 % — ABNORMAL LOW (ref 36.0–46.0)
Hemoglobin: 9.9 g/dL — ABNORMAL LOW (ref 12.0–15.0)
Immature Granulocytes: 0 %
Lymphocytes Relative: 40 %
Lymphs Abs: 1 10*3/uL (ref 0.7–4.0)
MCH: 28.3 pg (ref 26.0–34.0)
MCHC: 32.4 g/dL (ref 30.0–36.0)
MCV: 87.4 fL (ref 80.0–100.0)
Monocytes Absolute: 0.1 10*3/uL (ref 0.1–1.0)
Monocytes Relative: 4 %
Neutro Abs: 1.3 10*3/uL — ABNORMAL LOW (ref 1.7–7.7)
Neutrophils Relative %: 52 %
Platelet Count: 282 10*3/uL (ref 150–400)
RBC: 3.5 MIL/uL — ABNORMAL LOW (ref 3.87–5.11)
RDW: 16.8 % — ABNORMAL HIGH (ref 11.5–15.5)
WBC Count: 2.6 10*3/uL — ABNORMAL LOW (ref 4.0–10.5)
nRBC: 0 % (ref 0.0–0.2)

## 2021-03-23 LAB — CMP (CANCER CENTER ONLY)
ALT: <6 U/L (ref 0–44)
AST: 14 U/L — ABNORMAL LOW (ref 15–41)
Albumin: 3 g/dL — ABNORMAL LOW (ref 3.5–5.0)
Alkaline Phosphatase: 98 U/L (ref 38–126)
Anion gap: 6 (ref 5–15)
BUN: 17 mg/dL (ref 8–23)
CO2: 28 mmol/L (ref 22–32)
Calcium: 9.6 mg/dL (ref 8.9–10.3)
Chloride: 97 mmol/L — ABNORMAL LOW (ref 98–111)
Creatinine: 0.84 mg/dL (ref 0.44–1.00)
GFR, Estimated: >60 mL/min (ref >60)
Glucose, Bld: 170 mg/dL — ABNORMAL HIGH (ref 70–99)
Potassium: 4.7 mmol/L (ref 3.5–5.1)
Sodium: 131 mmol/L — ABNORMAL LOW (ref 135–145)
Total Bilirubin: 0.4 mg/dL (ref 0.3–1.2)
Total Protein: 6.3 g/dL — ABNORMAL LOW (ref 6.5–8.1)

## 2021-03-23 LAB — FUNGUS CULTURE WITH STAIN

## 2021-03-23 LAB — FUNGAL ORGANISM REFLEX

## 2021-03-23 LAB — FUNGUS CULTURE RESULT

## 2021-03-23 MED ORDER — DIPHENHYDRAMINE HCL 50 MG/ML IJ SOLN
50.0000 mg | Freq: Once | INTRAMUSCULAR | Status: AC
  Administered 2021-03-23: 50 mg via INTRAVENOUS

## 2021-03-23 MED ORDER — DIPHENHYDRAMINE HCL 50 MG/ML IJ SOLN
INTRAMUSCULAR | Status: AC
  Filled 2021-03-23: qty 1

## 2021-03-23 MED ORDER — SODIUM CHLORIDE 0.9 % IV SOLN
Freq: Once | INTRAVENOUS | Status: AC
  Filled 2021-03-23: qty 250

## 2021-03-23 MED ORDER — SODIUM CHLORIDE 0.9 % IV SOLN
45.0000 mg/m2 | Freq: Once | INTRAVENOUS | Status: AC
  Administered 2021-03-23: 72 mg via INTRAVENOUS
  Filled 2021-03-23: qty 12

## 2021-03-23 MED ORDER — PALONOSETRON HCL INJECTION 0.25 MG/5ML
INTRAVENOUS | Status: AC
  Filled 2021-03-23: qty 5

## 2021-03-23 MED ORDER — FAMOTIDINE 20 MG IN NS 100 ML IVPB
20.0000 mg | Freq: Once | INTRAVENOUS | Status: AC
  Administered 2021-03-23: 20 mg via INTRAVENOUS

## 2021-03-23 MED ORDER — FAMOTIDINE 20 MG IN NS 100 ML IVPB
INTRAVENOUS | Status: AC
  Filled 2021-03-23: qty 100

## 2021-03-23 MED ORDER — SODIUM CHLORIDE 0.9 % IV SOLN
20.0000 mg | Freq: Once | INTRAVENOUS | Status: AC
  Administered 2021-03-23: 20 mg via INTRAVENOUS
  Filled 2021-03-23: qty 20

## 2021-03-23 MED ORDER — SODIUM CHLORIDE 0.9 % IV SOLN
110.0000 mg | Freq: Once | INTRAVENOUS | Status: AC
  Administered 2021-03-23: 110 mg via INTRAVENOUS
  Filled 2021-03-23: qty 11

## 2021-03-23 MED ORDER — PALONOSETRON HCL INJECTION 0.25 MG/5ML
0.2500 mg | Freq: Once | INTRAVENOUS | Status: AC
Start: 2021-03-23 — End: 2021-03-23
  Administered 2021-03-23: 0.25 mg via INTRAVENOUS

## 2021-03-23 NOTE — Progress Notes (Signed)
[facility] Telephone:([phone]) [phone]   Fax:([phone]) [place], MD [place] [address]  DIAGNOSIS: Stage IIIb (T3, N2, MX) non-small cell lung cancer,  squamous cell carcinoma. She presented with large right middle lobe lung mass in addition to subcarinal lymphadenopathy.  She was diagnosed in May 2022   PDL1: 95%   PRIOR THERAPY: None   CURRENT THERAPY: Concurrent chemoradiation with carboplatin for an AUC of 2 and paclitaxel 45 mg/m2.  First dose expected on 03/02/2021.  Status post 3 cycles  INTERVAL HISTORY: Dawn [name] 80 y.o. female returns to the clinic today for follow-up visit accompanied by her son.  The patient is feeling fine today with no concerning complaints except for occasional epigastric pain.  She denied having any dysphagia or odynophagia.  She has no nausea, vomiting, diarrhea or constipation.  She has no chest pain, shortness of breath, cough or hemoptysis.  She has no headache or visual changes.  She continues to tolerate her treatment with concurrent chemoradiation fairly well.  The patient is here today for evaluation before starting cycle #4.  MEDICAL HISTORY: Past Medical History:  Diagnosis Date   Anemia    Anxiety    Arthritis    Breast cancer of upper-outer quadrant of left female breast ([facility]) 08/18/2015   Broken wrist    when younger x2   COPD (chronic obstructive pulmonary disease) ([facility])    Hospitalization March 2011   COVID-19 01/22/2021   Dementia ([facility])    Depression    Diabetes mellitus without complication ([facility])    type 2   Dyspnea    Full dentures    H/O vaginal delivery 1962   x1    High cholesterol    History of home oxygen therapy    Hypertension    Memory changes    sharp decline over the last 3 months, per son as of 01/22/21,    Pneumonia 01/22/2021   Radiation    11/18/15- 12/16/15 to her Left Breast    ALLERGIES:  is allergic to ferumoxytol, pneumococcal  vaccines, and repaglinide.  MEDICATIONS:  Current Outpatient Medications  Medication Sig Dispense Refill   albuterol (VENTOLIN HFA) 108 (90 Base) MCG/ACT inhaler Inhale 2 puffs into the lungs every 2 (two) hours as needed for wheezing or shortness of breath. Please instruct in usage 1 each 2   anastrozole (ARIMIDEX) 1 MG tablet Take 1 tablet (1 mg total) by mouth daily. 30 tablet 0   aspirin EC 81 MG tablet Take 81 mg by mouth daily.     Cholecalciferol (VITAMIN D3) 125 MCG (5000 UT) CAPS 1 tablet     ferrous sulfate 325 (65 FE) MG tablet Take 325 mg by mouth 2 (two) times daily.     metFORMIN (GLUCOPHAGE-XR) 500 MG 24 hr tablet 1 tablet with evening meal     nicotine (NICODERM CQ - DOSED IN MG/24 HOURS) 14 mg/24hr patch Place 1 patch (14 mg total) onto the skin daily. 28 patch 0   nitroGLYCERIN (NITROSTAT) 0.4 MG SL tablet Place 0.4 mg under the tongue every 15 (fifteen) minutes as needed for chest pain.     pantoprazole (PROTONIX) 40 MG tablet Take 1 tablet (40 mg total) by mouth daily before breakfast. 30 tablet 0   pentoxifylline (TRENTAL) 400 MG CR tablet 1 tablet with meals     pioglitazone (ACTOS) 15 MG tablet Take 1 tablet (15 mg total) by  mouth daily. 30 tablet 1   prochlorperazine (COMPAZINE) 10 MG tablet Take 1 tablet (10 mg total) by mouth every 6 (six) hours as needed. 30 tablet 2   rosuvastatin (CRESTOR) 40 MG tablet Take 1 tablet (40 mg total) by mouth daily. 30 tablet 0   Tiotropium Bromide-Olodaterol (STIOLTO RESPIMAT) 2.5-2.5 MCG/ACT AERS Inhale 2 puffs into the lungs daily. 4 g 0   traMADol (ULTRAM) 50 MG tablet 1 tablet as needed     No current facility-administered medications for this visit.    SURGICAL HISTORY:  Past Surgical History:  Procedure Laterality Date   ABDOMINAL HYSTERECTOMY     APPENDECTOMY     BRONCHIAL BIOPSY  02/16/2021   Procedure: BRONCHIAL BIOPSIES;  Surgeon: Collene Gobble, MD;  Location: [facility];  Service: Pulmonary;;   BRONCHIAL BRUSHINGS   02/16/2021   Procedure: BRONCHIAL BRUSHINGS;  Surgeon: Collene Gobble, MD;  Location: [facility];  Service: Pulmonary;;   BRONCHIAL NEEDLE ASPIRATION BIOPSY  02/16/2021   Procedure: BRONCHIAL NEEDLE ASPIRATION BIOPSIES;  Surgeon: Collene Gobble, MD;  Location: [facility];  Service: Pulmonary;;   CHOLECYSTECTOMY     EYE SURGERY     cataracts   RADIOACTIVE SEED GUIDED PARTIAL MASTECTOMY WITH AXILLARY SENTINEL LYMPH NODE BIOPSY Left 09/17/2015   Procedure: RADIOACTIVE SEED LOCALIZATION LEFT BREAST LUMPECTOMY AND LEFT AXILLARY SENTINEL LYMPH NODE BIOPSY;  Surgeon: Excell Seltzer, MD;  Location: [facility];  Service: General;  Laterality: Left;   TONSILLECTOMY     VIDEO BRONCHOSCOPY WITH ENDOBRONCHIAL NAVIGATION Right 02/16/2021   Procedure: VIDEO BRONCHOSCOPY WITH ENDOBRONCHIAL NAVIGATION;  Surgeon: Collene Gobble, MD;  Location: [facility];  Service: Pulmonary;  Laterality: Right;    REVIEW OF SYSTEMS:  A comprehensive review of systems was negative except for: Gastrointestinal: positive for abdominal pain   PHYSICAL EXAMINATION: General appearance: alert, cooperative, and no distress Head: Normocephalic, without obvious abnormality, atraumatic Neck: no adenopathy, no JVD, supple, symmetrical, trachea midline, and thyroid not enlarged, symmetric, no tenderness/mass/nodules Lymph nodes: Cervical, supraclavicular, and axillary nodes normal. Resp: clear to auscultation bilaterally Back: symmetric, no curvature. ROM normal. No CVA tenderness. Cardio: regular rate and rhythm, S1, S2 normal, no murmur, click, rub or gallop GI: soft, non-tender; bowel sounds normal; no masses,  no organomegaly Extremities: extremities normal, atraumatic, no cyanosis or edema  ECOG PERFORMANCE STATUS: 1 - Symptomatic but completely ambulatory  Blood pressure 137/70, pulse 97, temperature 98.4 F (36.9 C), temperature source Oral, resp. rate 18, height 5' 7" (1.702 m), weight 114 lb 4.8 oz (51.8 kg), SpO2 98  %.  LABORATORY DATA: Lab Results  Component Value Date   WBC 2.6 (L) 03/23/2021   HGB 9.9 (L) 03/23/2021   HCT 30.6 (L) 03/23/2021   MCV 87.4 03/23/2021   PLT 282 03/23/2021      Chemistry      Component Value Date/Time   NA 131 (L) 03/23/2021 1210   NA 137 08/20/2015 1232   K 4.7 03/23/2021 1210   K 4.3 08/20/2015 1232   CL 97 (L) 03/23/2021 1210   CO2 28 03/23/2021 1210   CO2 25 08/20/2015 1232   BUN 17 03/23/2021 1210   BUN 10.4 08/20/2015 1232   CREATININE 0.84 03/23/2021 1210   CREATININE 1.1 08/20/2015 1232      Component Value Date/Time   CALCIUM 9.6 03/23/2021 1210   CALCIUM 10.0 08/20/2015 1232   ALKPHOS 98 03/23/2021 1210   ALKPHOS 142 08/20/2015 1232   AST 14 (L) 03/23/2021 1210  AST 18 08/20/2015 1232   ALT <6 03/23/2021 1210   ALT 11 08/20/2015 1232   BILITOT 0.4 03/23/2021 1210   BILITOT 0.32 08/20/2015 1232       RADIOGRAPHIC STUDIES: No results found.  ASSESSMENT AND PLAN: This is a very pleasant 80 years old white female with a stage IIIb (T3, N2, M0) non-small cell lung cancer, squamous cell carcinoma presented with large right middle lobe lung mass in addition to subcarinal lymphadenopathy diagnosed in May 2022 with PD-L1 expression of 95%. The patient is currently undergoing a course of concurrent chemoradiation with weekly carboplatin for AUC of 2 and paclitaxel 45 Mg/M2 status post 3 cycles of treatment. She has been tolerating this treatment well with no concerning adverse effects. I recommended for the patient to proceed with cycle #4 today as planned. She will come back for follow-up visit in 2 weeks for evaluation before starting cycle #6. For pain management the patient will continue her treatment with Percocet and Tylenol on as-needed basis. She was advised to call immediately if she has any other concerning symptoms in the interval. The patient voices understanding of current disease status and treatment options and is in agreement  with the current care plan.  All questions were answered. The patient knows to call the clinic with any problems, questions or concerns. We can certainly see the patient much sooner if necessary. The total time spent in the appointment was 20 minutes.  Disclaimer: This note was dictated with voice recognition software. Similar sounding words can inadvertently be transcribed and may not be corrected upon review.

## 2021-03-23 NOTE — Patient Instructions (Signed)
[facility]  Discharge Instructions: Thank you for choosing [facility] to provide your oncology and hematology care.   If you have a lab appointment with the [facility], please go directly to the [facility] and check in at the registration area.   Wear comfortable clothing and clothing appropriate for easy access to any Portacath or PICC line.   We strive to give you quality time with your provider. You may need to reschedule your appointment if you arrive late (15 or more minutes).  Arriving late affects you and other patients whose appointments are after yours.  Also, if you miss three or more appointments without notifying the office, you may be dismissed from the clinic at the provider's discretion.      For prescription refill requests, have your pharmacy contact our office and allow 72 hours for refills to be completed.    Today you received the following chemotherapy and/or immunotherapy agents carboplatin, paclitaxel      To help prevent nausea and vomiting after your treatment, we encourage you to take your nausea medication as directed.  BELOW ARE SYMPTOMS THAT SHOULD BE REPORTED IMMEDIATELY: *FEVER GREATER THAN 100.4 F (38 C) OR HIGHER *CHILLS OR SWEATING *NAUSEA AND VOMITING THAT IS NOT CONTROLLED WITH YOUR NAUSEA MEDICATION *UNUSUAL SHORTNESS OF BREATH *UNUSUAL BRUISING OR BLEEDING *URINARY PROBLEMS (pain or burning when urinating, or frequent urination) *BOWEL PROBLEMS (unusual diarrhea, constipation, pain near the anus) TENDERNESS IN MOUTH AND THROAT WITH OR WITHOUT PRESENCE OF ULCERS (sore throat, sores in mouth, or a toothache) UNUSUAL RASH, SWELLING OR PAIN  UNUSUAL VAGINAL DISCHARGE OR ITCHING   Items with * indicate a potential emergency and should be followed up as soon as possible or go to the Emergency Department if any problems should occur.  Please show the CHEMOTHERAPY ALERT CARD or IMMUNOTHERAPY ALERT CARD at  check-in to the Emergency Department and triage nurse.  Should you have questions after your visit or need to cancel or reschedule your appointment, please contact [facility]  Dept: [phone]  and follow the prompts.  Office hours are 8:00 a.m. to 4:30 p.m. Monday - Friday. Please note that voicemails left after 4:00 p.m. may not be returned until the following business day.  We are closed weekends and major holidays. You have access to a nurse at all times for urgent questions. Please call the main number to the clinic Dept: [phone] and follow the prompts.   For any non-urgent questions, you may also contact your provider using MyChart. We now offer e-Visits for anyone 56 and older to request care online for non-urgent symptoms. For details visit [email or web address].   Also download the MyChart app! Go to the app store, search "MyChart", open the app, select [facility], and log in with your MyChart username and password.  Due to Covid, a mask is required upon entering the hospital/clinic. If you do not have a mask, one will be given to you upon arrival. For doctor visits, patients may have 1 support person aged 7 or older with them. For treatment visits, patients cannot have anyone with them due to current Covid guidelines and our immunocompromised population.

## 2021-03-23 NOTE — Progress Notes (Signed)
MD Julien Nordmann would like to continue 110 mg dose of Carboplatin.

## 2021-03-23 NOTE — Progress Notes (Signed)
Per Dr. Julien Nordmann, ok to treat with [place] 1.3.

## 2021-03-24 ENCOUNTER — Ambulatory Visit
Admission: RE | Admit: 2021-03-24 | Discharge: 2021-03-24 | Disposition: A | Discharge status: Home / Self Care | Payer: Medicare Other | Source: Ambulatory Visit | Attending: Radiation Oncology | Admitting: Radiation Oncology

## 2021-03-24 DIAGNOSIS — C3491 Malignant neoplasm of unspecified part of right bronchus or lung: Secondary | ICD-10-CM | POA: Diagnosis not present

## 2021-03-24 DIAGNOSIS — E1165 Type 2 diabetes mellitus with hyperglycemia: Secondary | ICD-10-CM | POA: Diagnosis not present

## 2021-03-24 DIAGNOSIS — M1990 Unspecified osteoarthritis, unspecified site: Secondary | ICD-10-CM | POA: Diagnosis not present

## 2021-03-24 DIAGNOSIS — E876 Hypokalemia: Secondary | ICD-10-CM | POA: Diagnosis not present

## 2021-03-24 DIAGNOSIS — I10 Essential (primary) hypertension: Secondary | ICD-10-CM | POA: Diagnosis not present

## 2021-03-24 DIAGNOSIS — D630 Anemia in neoplastic disease: Secondary | ICD-10-CM | POA: Diagnosis not present

## 2021-03-24 DIAGNOSIS — J449 Chronic obstructive pulmonary disease, unspecified: Secondary | ICD-10-CM | POA: Diagnosis not present

## 2021-03-24 DIAGNOSIS — C342 Malignant neoplasm of middle lobe, bronchus or lung: Secondary | ICD-10-CM | POA: Diagnosis not present

## 2021-03-24 DIAGNOSIS — D381 Neoplasm of uncertain behavior of trachea, bronchus and lung: Secondary | ICD-10-CM | POA: Diagnosis not present

## 2021-03-24 DIAGNOSIS — R627 Adult failure to thrive: Secondary | ICD-10-CM | POA: Diagnosis not present

## 2021-03-24 DIAGNOSIS — U071 COVID-19: Secondary | ICD-10-CM | POA: Diagnosis not present

## 2021-03-24 DIAGNOSIS — E785 Hyperlipidemia, unspecified: Secondary | ICD-10-CM | POA: Diagnosis not present

## 2021-03-24 DIAGNOSIS — F17210 Nicotine dependence, cigarettes, uncomplicated: Secondary | ICD-10-CM | POA: Diagnosis not present

## 2021-03-24 DIAGNOSIS — R413 Other amnesia: Secondary | ICD-10-CM | POA: Diagnosis not present

## 2021-03-24 DIAGNOSIS — C50412 Malignant neoplasm of upper-outer quadrant of left female breast: Secondary | ICD-10-CM | POA: Diagnosis not present

## 2021-03-24 DIAGNOSIS — E1169 Type 2 diabetes mellitus with other specified complication: Secondary | ICD-10-CM | POA: Diagnosis not present

## 2021-03-25 ENCOUNTER — Ambulatory Visit
Admission: RE | Admit: 2021-03-25 | Discharge: 2021-03-25 | Disposition: A | Discharge status: Home / Self Care | Payer: Medicare Other | Source: Ambulatory Visit | Attending: Radiation Oncology | Admitting: Radiation Oncology

## 2021-03-25 ENCOUNTER — Other Ambulatory Visit: Payer: Self-pay

## 2021-03-25 DIAGNOSIS — F17210 Nicotine dependence, cigarettes, uncomplicated: Secondary | ICD-10-CM | POA: Diagnosis not present

## 2021-03-25 DIAGNOSIS — C3491 Malignant neoplasm of unspecified part of right bronchus or lung: Secondary | ICD-10-CM | POA: Diagnosis not present

## 2021-03-25 DIAGNOSIS — C342 Malignant neoplasm of middle lobe, bronchus or lung: Secondary | ICD-10-CM | POA: Diagnosis not present

## 2021-03-26 ENCOUNTER — Ambulatory Visit
Admission: RE | Admit: 2021-03-26 | Discharge: 2021-03-26 | Disposition: A | Discharge status: Home / Self Care | Payer: Medicare Other | Source: Ambulatory Visit | Attending: Radiation Oncology | Admitting: Radiation Oncology

## 2021-03-26 DIAGNOSIS — C3491 Malignant neoplasm of unspecified part of right bronchus or lung: Secondary | ICD-10-CM | POA: Diagnosis not present

## 2021-03-26 DIAGNOSIS — R413 Other amnesia: Secondary | ICD-10-CM | POA: Diagnosis not present

## 2021-03-26 DIAGNOSIS — J449 Chronic obstructive pulmonary disease, unspecified: Secondary | ICD-10-CM | POA: Diagnosis not present

## 2021-03-26 DIAGNOSIS — C50412 Malignant neoplasm of upper-outer quadrant of left female breast: Secondary | ICD-10-CM | POA: Diagnosis not present

## 2021-03-26 DIAGNOSIS — I10 Essential (primary) hypertension: Secondary | ICD-10-CM | POA: Diagnosis not present

## 2021-03-26 DIAGNOSIS — M1990 Unspecified osteoarthritis, unspecified site: Secondary | ICD-10-CM | POA: Diagnosis not present

## 2021-03-26 DIAGNOSIS — E1169 Type 2 diabetes mellitus with other specified complication: Secondary | ICD-10-CM | POA: Diagnosis not present

## 2021-03-26 DIAGNOSIS — R627 Adult failure to thrive: Secondary | ICD-10-CM | POA: Diagnosis not present

## 2021-03-26 DIAGNOSIS — F17210 Nicotine dependence, cigarettes, uncomplicated: Secondary | ICD-10-CM | POA: Diagnosis not present

## 2021-03-26 DIAGNOSIS — D630 Anemia in neoplastic disease: Secondary | ICD-10-CM | POA: Diagnosis not present

## 2021-03-26 DIAGNOSIS — E785 Hyperlipidemia, unspecified: Secondary | ICD-10-CM | POA: Diagnosis not present

## 2021-03-26 DIAGNOSIS — C342 Malignant neoplasm of middle lobe, bronchus or lung: Secondary | ICD-10-CM | POA: Diagnosis not present

## 2021-03-26 DIAGNOSIS — D381 Neoplasm of uncertain behavior of trachea, bronchus and lung: Secondary | ICD-10-CM | POA: Diagnosis not present

## 2021-03-26 DIAGNOSIS — U071 COVID-19: Secondary | ICD-10-CM | POA: Diagnosis not present

## 2021-03-26 DIAGNOSIS — E1165 Type 2 diabetes mellitus with hyperglycemia: Secondary | ICD-10-CM | POA: Diagnosis not present

## 2021-03-26 DIAGNOSIS — E876 Hypokalemia: Secondary | ICD-10-CM | POA: Diagnosis not present

## 2021-03-27 ENCOUNTER — Other Ambulatory Visit: Payer: Self-pay

## 2021-03-27 ENCOUNTER — Ambulatory Visit
Admission: RE | Admit: 2021-03-27 | Discharge: 2021-03-27 | Disposition: A | Discharge status: Home / Self Care | Payer: Medicare Other | Source: Ambulatory Visit | Attending: Radiation Oncology | Admitting: Radiation Oncology

## 2021-03-27 DIAGNOSIS — E876 Hypokalemia: Secondary | ICD-10-CM | POA: Diagnosis not present

## 2021-03-27 DIAGNOSIS — F17210 Nicotine dependence, cigarettes, uncomplicated: Secondary | ICD-10-CM | POA: Diagnosis not present

## 2021-03-27 DIAGNOSIS — M1990 Unspecified osteoarthritis, unspecified site: Secondary | ICD-10-CM | POA: Diagnosis not present

## 2021-03-27 DIAGNOSIS — C3491 Malignant neoplasm of unspecified part of right bronchus or lung: Secondary | ICD-10-CM | POA: Diagnosis not present

## 2021-03-27 DIAGNOSIS — E1165 Type 2 diabetes mellitus with hyperglycemia: Secondary | ICD-10-CM | POA: Diagnosis not present

## 2021-03-27 DIAGNOSIS — E785 Hyperlipidemia, unspecified: Secondary | ICD-10-CM | POA: Diagnosis not present

## 2021-03-27 DIAGNOSIS — R627 Adult failure to thrive: Secondary | ICD-10-CM | POA: Diagnosis not present

## 2021-03-27 DIAGNOSIS — R413 Other amnesia: Secondary | ICD-10-CM | POA: Diagnosis not present

## 2021-03-27 DIAGNOSIS — I10 Essential (primary) hypertension: Secondary | ICD-10-CM | POA: Diagnosis not present

## 2021-03-27 DIAGNOSIS — J449 Chronic obstructive pulmonary disease, unspecified: Secondary | ICD-10-CM | POA: Diagnosis not present

## 2021-03-27 DIAGNOSIS — D381 Neoplasm of uncertain behavior of trachea, bronchus and lung: Secondary | ICD-10-CM | POA: Diagnosis not present

## 2021-03-27 DIAGNOSIS — E1169 Type 2 diabetes mellitus with other specified complication: Secondary | ICD-10-CM | POA: Diagnosis not present

## 2021-03-27 DIAGNOSIS — D630 Anemia in neoplastic disease: Secondary | ICD-10-CM | POA: Diagnosis not present

## 2021-03-27 DIAGNOSIS — C50412 Malignant neoplasm of upper-outer quadrant of left female breast: Secondary | ICD-10-CM | POA: Diagnosis not present

## 2021-03-27 DIAGNOSIS — C342 Malignant neoplasm of middle lobe, bronchus or lung: Secondary | ICD-10-CM | POA: Diagnosis not present

## 2021-03-27 DIAGNOSIS — U071 COVID-19: Secondary | ICD-10-CM | POA: Diagnosis not present

## 2021-03-30 ENCOUNTER — Inpatient Hospital Stay: Payer: Medicare Other

## 2021-03-30 ENCOUNTER — Ambulatory Visit
Admission: RE | Admit: 2021-03-30 | Discharge: 2021-03-30 | Disposition: A | Discharge status: Home / Self Care | Payer: Medicare Other | Source: Ambulatory Visit | Attending: Radiation Oncology | Admitting: Radiation Oncology

## 2021-03-30 ENCOUNTER — Other Ambulatory Visit: Payer: Self-pay

## 2021-03-30 VITALS — BP 145/84 | HR 98 | Temp 97.9°F | Resp 18 | Ht 67.0 in | Wt 113.8 lb

## 2021-03-30 DIAGNOSIS — C3491 Malignant neoplasm of unspecified part of right bronchus or lung: Secondary | ICD-10-CM

## 2021-03-30 DIAGNOSIS — Z5111 Encounter for antineoplastic chemotherapy: Secondary | ICD-10-CM | POA: Diagnosis not present

## 2021-03-30 DIAGNOSIS — R110 Nausea: Secondary | ICD-10-CM | POA: Diagnosis not present

## 2021-03-30 DIAGNOSIS — R498 Other voice and resonance disorders: Secondary | ICD-10-CM | POA: Diagnosis not present

## 2021-03-30 DIAGNOSIS — J449 Chronic obstructive pulmonary disease, unspecified: Secondary | ICD-10-CM | POA: Diagnosis not present

## 2021-03-30 DIAGNOSIS — Z79899 Other long term (current) drug therapy: Secondary | ICD-10-CM | POA: Diagnosis not present

## 2021-03-30 DIAGNOSIS — F17210 Nicotine dependence, cigarettes, uncomplicated: Secondary | ICD-10-CM | POA: Diagnosis not present

## 2021-03-30 DIAGNOSIS — R0609 Other forms of dyspnea: Secondary | ICD-10-CM | POA: Diagnosis not present

## 2021-03-30 DIAGNOSIS — E119 Type 2 diabetes mellitus without complications: Secondary | ICD-10-CM | POA: Diagnosis not present

## 2021-03-30 DIAGNOSIS — I10 Essential (primary) hypertension: Secondary | ICD-10-CM | POA: Diagnosis not present

## 2021-03-30 DIAGNOSIS — C342 Malignant neoplasm of middle lobe, bronchus or lung: Secondary | ICD-10-CM | POA: Diagnosis not present

## 2021-03-30 LAB — CMP (CANCER CENTER ONLY)
ALT: 7 U/L (ref 0–44)
AST: 13 U/L — ABNORMAL LOW (ref 15–41)
Albumin: 3.2 g/dL — ABNORMAL LOW (ref 3.5–5.0)
Alkaline Phosphatase: 92 U/L (ref 38–126)
Anion gap: 9 (ref 5–15)
BUN: 13 mg/dL (ref 8–23)
CO2: 27 mmol/L (ref 22–32)
Calcium: 9.5 mg/dL (ref 8.9–10.3)
Chloride: 98 mmol/L (ref 98–111)
Creatinine: 0.92 mg/dL (ref 0.44–1.00)
GFR, Estimated: >60 mL/min (ref >60)
Glucose, Bld: 242 mg/dL — ABNORMAL HIGH (ref 70–99)
Potassium: 4 mmol/L (ref 3.5–5.1)
Sodium: 134 mmol/L — ABNORMAL LOW (ref 135–145)
Total Bilirubin: 0.5 mg/dL (ref 0.3–1.2)
Total Protein: 6.8 g/dL (ref 6.5–8.1)

## 2021-03-30 LAB — CBC WITH DIFFERENTIAL (CANCER CENTER ONLY)
Abs Immature Granulocytes: 0.01 10*3/uL (ref 0.00–0.07)
Basophils Absolute: 0 10*3/uL (ref 0.0–0.1)
Basophils Relative: 1 %
Eosinophils Absolute: 0 10*3/uL (ref 0.0–0.5)
Eosinophils Relative: 1 %
HCT: 32.4 % — ABNORMAL LOW (ref 36.0–46.0)
Hemoglobin: 10.4 g/dL — ABNORMAL LOW (ref 12.0–15.0)
Immature Granulocytes: 1 %
Lymphocytes Relative: 25 %
Lymphs Abs: 0.6 10*3/uL — ABNORMAL LOW (ref 0.7–4.0)
MCH: 28.3 pg (ref 26.0–34.0)
MCHC: 32.1 g/dL (ref 30.0–36.0)
MCV: 88 fL (ref 80.0–100.0)
Monocytes Absolute: 0.1 10*3/uL (ref 0.1–1.0)
Monocytes Relative: 5 %
Neutro Abs: 1.5 10*3/uL — ABNORMAL LOW (ref 1.7–7.7)
Neutrophils Relative %: 68 %
Platelet Count: 191 10*3/uL (ref 150–400)
RBC: 3.68 MIL/uL — ABNORMAL LOW (ref 3.87–5.11)
RDW: 17.6 % — ABNORMAL HIGH (ref 11.5–15.5)
WBC Count: 2.2 10*3/uL — ABNORMAL LOW (ref 4.0–10.5)
nRBC: 0 % (ref 0.0–0.2)

## 2021-03-30 MED ORDER — SODIUM CHLORIDE 0.9 % IV SOLN
45.0000 mg/m2 | Freq: Once | INTRAVENOUS | Status: AC
  Administered 2021-03-30: 72 mg via INTRAVENOUS
  Filled 2021-03-30: qty 12

## 2021-03-30 MED ORDER — DIPHENHYDRAMINE HCL 50 MG/ML IJ SOLN
50.0000 mg | Freq: Once | INTRAMUSCULAR | Status: AC
  Administered 2021-03-30: 50 mg via INTRAVENOUS

## 2021-03-30 MED ORDER — FAMOTIDINE 20 MG IN NS 100 ML IVPB
20.0000 mg | Freq: Once | INTRAVENOUS | Status: AC
  Administered 2021-03-30: 20 mg via INTRAVENOUS

## 2021-03-30 MED ORDER — SODIUM CHLORIDE 0.9 % IV SOLN
20.0000 mg | Freq: Once | INTRAVENOUS | Status: AC
  Administered 2021-03-30: 20 mg via INTRAVENOUS
  Filled 2021-03-30: qty 20

## 2021-03-30 MED ORDER — DIPHENHYDRAMINE HCL 50 MG/ML IJ SOLN
INTRAMUSCULAR | Status: AC
  Filled 2021-03-30: qty 1

## 2021-03-30 MED ORDER — SODIUM CHLORIDE 0.9 % IV SOLN
Freq: Once | INTRAVENOUS | Status: AC
  Filled 2021-03-30: qty 250

## 2021-03-30 MED ORDER — FAMOTIDINE 20 MG IN NS 100 ML IVPB
INTRAVENOUS | Status: AC
  Filled 2021-03-30: qty 100

## 2021-03-30 MED ORDER — PALONOSETRON HCL INJECTION 0.25 MG/5ML
0.2500 mg | Freq: Once | INTRAVENOUS | Status: AC
  Administered 2021-03-30: 0.25 mg via INTRAVENOUS

## 2021-03-30 MED ORDER — PALONOSETRON HCL INJECTION 0.25 MG/5ML
INTRAVENOUS | Status: AC
  Filled 2021-03-30: qty 5

## 2021-03-30 MED ORDER — SODIUM CHLORIDE 0.9 % IV SOLN
110.0000 mg | Freq: Once | INTRAVENOUS | Status: AC
  Administered 2021-03-30: 110 mg via INTRAVENOUS
  Filled 2021-03-30: qty 11

## 2021-03-30 NOTE — Progress Notes (Signed)
Maintain dose of carboplatin at 110 mg per Dr Jesse Sans, PharmD

## 2021-03-30 NOTE — Patient Instructions (Signed)
[facility]  Discharge Instructions: Thank you for choosing [facility] to provide your oncology and hematology care.   If you have a lab appointment with the Cancer Center, please go directly to the Cancer Center and check in at the registration area.   Wear comfortable clothing and clothing appropriate for easy access to any Portacath or PICC line.   We strive to give you quality time with your provider. You may need to reschedule your appointment if you arrive late (15 or more minutes).  Arriving late affects you and other patients whose appointments are after yours.  Also, if you miss three or more appointments without notifying the office, you may be dismissed from the clinic at the provider's discretion.      For prescription refill requests, have your pharmacy contact our office and allow 72 hours for refills to be completed.    Today you received the following chemotherapy and/or immunotherapy agents: Paclitaxel (Taxol) and Carboplatin.   To help prevent nausea and vomiting after your treatment, we encourage you to take your nausea medication as directed.  BELOW ARE SYMPTOMS THAT SHOULD BE REPORTED IMMEDIATELY: *FEVER GREATER THAN 100.4 F (38 C) OR HIGHER *CHILLS OR SWEATING *NAUSEA AND VOMITING THAT IS NOT CONTROLLED WITH YOUR NAUSEA MEDICATION *UNUSUAL SHORTNESS OF BREATH *UNUSUAL BRUISING OR BLEEDING *URINARY PROBLEMS (pain or burning when urinating, or frequent urination) *BOWEL PROBLEMS (unusual diarrhea, constipation, pain near the anus) TENDERNESS IN MOUTH AND THROAT WITH OR WITHOUT PRESENCE OF ULCERS (sore throat, sores in mouth, or a toothache) UNUSUAL RASH, SWELLING OR PAIN  UNUSUAL VAGINAL DISCHARGE OR ITCHING   Items with * indicate a potential emergency and should be followed up as soon as possible or go to the Emergency Department if any problems should occur.  Please show the CHEMOTHERAPY ALERT CARD or IMMUNOTHERAPY  ALERT CARD at check-in to the Emergency Department and triage nurse.  Should you have questions after your visit or need to cancel or reschedule your appointment, please contact [facility]  Dept: [phone]  and follow the prompts.  Office hours are 8:00 a.m. to 4:30 p.m. Monday - Friday. Please note that voicemails left after 4:00 p.m. may not be returned until the following business day.  We are closed weekends and major holidays. You have access to a nurse at all times for urgent questions. Please call the main number to the clinic Dept: [phone] and follow the prompts.   For any non-urgent questions, you may also contact your provider using MyChart. We now offer e-Visits for anyone 18 and older to request care online for non-urgent symptoms. For details visit [email or web address].   Also download the MyChart app! Go to the app store, search "MyChart", open the app, select [facility], and log in with your MyChart username and password.  Due to Covid, a mask is required upon entering the hospital/clinic. If you do not have a mask, one will be given to you upon arrival. For doctor visits, patients may have 1 support person aged 18 or older with them. For treatment visits, patients cannot have anyone with them due to current Covid guidelines and our immunocompromised population.   

## 2021-03-31 ENCOUNTER — Ambulatory Visit
Admission: RE | Admit: 2021-03-31 | Discharge: 2021-03-31 | Disposition: A | Discharge status: Home / Self Care | Payer: Medicare Other | Source: Ambulatory Visit | Attending: Radiation Oncology | Admitting: Radiation Oncology

## 2021-03-31 DIAGNOSIS — C3491 Malignant neoplasm of unspecified part of right bronchus or lung: Secondary | ICD-10-CM | POA: Diagnosis not present

## 2021-03-31 DIAGNOSIS — F17210 Nicotine dependence, cigarettes, uncomplicated: Secondary | ICD-10-CM | POA: Diagnosis not present

## 2021-03-31 DIAGNOSIS — C342 Malignant neoplasm of middle lobe, bronchus or lung: Secondary | ICD-10-CM | POA: Diagnosis not present

## 2021-04-01 ENCOUNTER — Ambulatory Visit
Admission: RE | Admit: 2021-04-01 | Discharge: 2021-04-01 | Disposition: A | Discharge status: Home / Self Care | Payer: Medicare Other | Source: Ambulatory Visit | Attending: Radiation Oncology | Admitting: Radiation Oncology

## 2021-04-01 ENCOUNTER — Other Ambulatory Visit: Payer: Self-pay

## 2021-04-01 DIAGNOSIS — C50412 Malignant neoplasm of upper-outer quadrant of left female breast: Secondary | ICD-10-CM | POA: Diagnosis not present

## 2021-04-01 DIAGNOSIS — R627 Adult failure to thrive: Secondary | ICD-10-CM | POA: Diagnosis not present

## 2021-04-01 DIAGNOSIS — U071 COVID-19: Secondary | ICD-10-CM | POA: Diagnosis not present

## 2021-04-01 DIAGNOSIS — E1169 Type 2 diabetes mellitus with other specified complication: Secondary | ICD-10-CM | POA: Diagnosis not present

## 2021-04-01 DIAGNOSIS — R413 Other amnesia: Secondary | ICD-10-CM | POA: Diagnosis not present

## 2021-04-01 DIAGNOSIS — D630 Anemia in neoplastic disease: Secondary | ICD-10-CM | POA: Diagnosis not present

## 2021-04-01 DIAGNOSIS — C3491 Malignant neoplasm of unspecified part of right bronchus or lung: Secondary | ICD-10-CM | POA: Diagnosis not present

## 2021-04-01 DIAGNOSIS — C342 Malignant neoplasm of middle lobe, bronchus or lung: Secondary | ICD-10-CM | POA: Diagnosis not present

## 2021-04-01 DIAGNOSIS — E1165 Type 2 diabetes mellitus with hyperglycemia: Secondary | ICD-10-CM | POA: Diagnosis not present

## 2021-04-01 DIAGNOSIS — M1990 Unspecified osteoarthritis, unspecified site: Secondary | ICD-10-CM | POA: Diagnosis not present

## 2021-04-01 DIAGNOSIS — D381 Neoplasm of uncertain behavior of trachea, bronchus and lung: Secondary | ICD-10-CM | POA: Diagnosis not present

## 2021-04-01 DIAGNOSIS — E785 Hyperlipidemia, unspecified: Secondary | ICD-10-CM | POA: Diagnosis not present

## 2021-04-01 DIAGNOSIS — J449 Chronic obstructive pulmonary disease, unspecified: Secondary | ICD-10-CM | POA: Diagnosis not present

## 2021-04-01 DIAGNOSIS — E876 Hypokalemia: Secondary | ICD-10-CM | POA: Diagnosis not present

## 2021-04-01 DIAGNOSIS — I10 Essential (primary) hypertension: Secondary | ICD-10-CM | POA: Diagnosis not present

## 2021-04-01 NOTE — Progress Notes (Signed)
[facility] OFFICE PROGRESS NOTE  Lawerance Cruel, MD [place] [address]  DIAGNOSIS: Stage IIIb (T3, N2, MX) non-small cell lung cancer,  squamous cell carcinoma. She presented with large right middle lobe lung mass in addition to subcarinal lymphadenopathy.  She was diagnosed in May 2022   PDL1: 95%  PRIOR THERAPY: None  CURRENT THERAPY: Concurrent chemoradiation with carboplatin for an AUC of 2 and paclitaxel 45 mg per metered squared first dose on 03/02/2021. Status post 5 cycles.   INTERVAL HISTORY: Dawn [name] 80 y.o. female returns to clinic today for a follow-up visit accompanied by her son.  The patient is feeling "fine" today without any concerning complaints. The patient has been tolerating her treatment fairly well except for mild intermittent nausea a few times a week. She has anti-emetics if needed which works majority of the time. She also has some intermittent chest achiness. She used Mylanta once which helped. She has tramadol if needed but she only needs to use this ~3x per week. She denies any odynophagia or dysphagia.  Her last day radiation is scheduled for 04/23/2021.  The patient denies any recent fever, chills, or night sweats.  The patient has been having some decreased appetite for which she was previously evaluated by member the nutritionist team.  Her weight is down about 1 lb today. She does drink a lot of water but does not like Gatorade. She has a telephone follow up scheduled on 04/08/21. She denies any hemoptysis.  She reports baseline dyspnea on exertion.  She denies significant cough and has a mild cough which "isn't that bad" per her son. She denies any diarrhea or constipation.  She denies skin infections or dysuria. She denies any headache or visual changes.  She is here today for evaluation before proceeding with cycle #6.     MEDICAL HISTORY: Past Medical History:  Diagnosis Date   Anemia    Anxiety    Arthritis     Breast cancer of upper-outer quadrant of left female breast ([facility]) 08/18/2015   Broken wrist    when younger x2   COPD (chronic obstructive pulmonary disease) ([facility])    Hospitalization March 2011   COVID-19 01/22/2021   Dementia ([facility])    Depression    Diabetes mellitus without complication ([facility])    type 2   Dyspnea    Full dentures    H/O vaginal delivery 1962   x1    High cholesterol    History of home oxygen therapy    Hypertension    Memory changes    sharp decline over the last 3 months, per son as of 01/22/21,    Pneumonia 01/22/2021   Radiation    11/18/15- 12/16/15 to her Left Breast    ALLERGIES:  is allergic to ferumoxytol, pneumococcal vaccines, and repaglinide.  MEDICATIONS:  Current Outpatient Medications  Medication Sig Dispense Refill   albuterol (VENTOLIN HFA) 108 (90 Base) MCG/ACT inhaler Inhale 2 puffs into the lungs every 2 (two) hours as needed for wheezing or shortness of breath. Please instruct in usage 1 each 2   anastrozole (ARIMIDEX) 1 MG tablet Take 1 tablet (1 mg total) by mouth daily. 30 tablet 0   aspirin EC 81 MG tablet Take 81 mg by mouth daily.     Cholecalciferol (VITAMIN D3) 125 MCG (5000 UT) CAPS 1 tablet     ferrous sulfate 325 (65 FE) MG tablet Take 325 mg by mouth 2 (two) times  daily.     Lancets (ONETOUCH ULTRASOFT) lancets USE TO TEST BLOOD GLUCOSE ONCE A DAY     LANTUS 100 UNIT/ML injection Inject 5 Units into the skin at bedtime.     memantine (NAMENDA) 5 MG tablet Take by mouth.     metFORMIN (GLUCOPHAGE-XR) 500 MG 24 hr tablet 1 tablet with evening meal     nitroGLYCERIN (NITROSTAT) 0.4 MG SL tablet Place 0.4 mg under the tongue every 15 (fifteen) minutes as needed for chest pain.     pentoxifylline (TRENTAL) 400 MG CR tablet 1 tablet with meals     pioglitazone (ACTOS) 15 MG tablet Take 1 tablet (15 mg total) by mouth daily. 30 tablet 1   prochlorperazine (COMPAZINE) 10 MG tablet Take 1 tablet (10 mg total) by mouth every 6 (six) hours as  needed. 30 tablet 2   rosuvastatin (CRESTOR) 40 MG tablet Take 1 tablet (40 mg total) by mouth daily. 30 tablet 0   Tiotropium Bromide-Olodaterol (STIOLTO RESPIMAT) 2.5-2.5 MCG/ACT AERS Inhale 2 puffs into the lungs daily. 4 g 0   traMADol (ULTRAM) 50 MG tablet 1 tablet as needed     TRUEPLUS INSULIN SYRINGE 31G X 5/16" 1 ML MISC INJECT AS DIRECTED ONCE A DAY     pantoprazole (PROTONIX) 40 MG tablet Take 1 tablet (40 mg total) by mouth daily before breakfast. 30 tablet 0   No current facility-administered medications for this visit.    SURGICAL HISTORY:  Past Surgical History:  Procedure Laterality Date   ABDOMINAL HYSTERECTOMY     APPENDECTOMY     BRONCHIAL BIOPSY  02/16/2021   Procedure: BRONCHIAL BIOPSIES;  Surgeon: Collene Gobble, MD;  Location: [facility];  Service: Pulmonary;;   BRONCHIAL BRUSHINGS  02/16/2021   Procedure: BRONCHIAL BRUSHINGS;  Surgeon: Collene Gobble, MD;  Location: [facility];  Service: Pulmonary;;   BRONCHIAL NEEDLE ASPIRATION BIOPSY  02/16/2021   Procedure: BRONCHIAL NEEDLE ASPIRATION BIOPSIES;  Surgeon: Collene Gobble, MD;  Location: [facility];  Service: Pulmonary;;   CHOLECYSTECTOMY     EYE SURGERY     cataracts   RADIOACTIVE SEED GUIDED PARTIAL MASTECTOMY WITH AXILLARY SENTINEL LYMPH NODE BIOPSY Left 09/17/2015   Procedure: RADIOACTIVE SEED LOCALIZATION LEFT BREAST LUMPECTOMY AND LEFT AXILLARY SENTINEL LYMPH NODE BIOPSY;  Surgeon: Excell Seltzer, MD;  Location: [facility];  Service: General;  Laterality: Left;   TONSILLECTOMY     VIDEO BRONCHOSCOPY WITH ENDOBRONCHIAL NAVIGATION Right 02/16/2021   Procedure: VIDEO BRONCHOSCOPY WITH ENDOBRONCHIAL NAVIGATION;  Surgeon: Collene Gobble, MD;  Location: [facility];  Service: Pulmonary;  Laterality: Right;    REVIEW OF SYSTEMS:   Review of Systems  Constitutional: Positive for stable fatigue and decreased appetite. Negative for chills and fever. HENT: The patient is hard of hearing.  Negative for mouth sores,  nosebleeds, sore throat and trouble swallowing.   Eyes: Negative for eye problems and icterus. Respiratory: Positive for baseline dyspnea on exertion and mild cough.  Negative for  hemoptysis and wheezing.   Cardiovascular: Negative for chest pain and leg swelling. Gastrointestinal: Positive for intermittent nausea. Negative for abdominal pain, diarrhea, nausea and vomiting. Genitourinary: Negative for bladder incontinence, difficulty urinating, dysuria, frequency and hematuria.   Musculoskeletal: Negative for back pain, gait problem, neck pain and neck stiffness. Skin: Negative for itching and rash. Neurological: Negative for dizziness, extremity weakness, gait problem, headaches, light-headedness and seizures. Hematological: Negative for adenopathy. Does not bruise/bleed easily. Psychiatric/Behavioral: Positive for memory deficits.  Negative for confusion, depression and sleep  disturbance. The patient is not nervous/anxious.     PHYSICAL EXAMINATION:  Blood pressure 100/66, pulse (!) 104, temperature 98.3 F (36.8 C), temperature source Temporal, resp. rate 18, height '5\' 7"'  (1.702 m), weight 113 lb 1.6 oz (51.3 kg), SpO2 100 %.  ECOG PERFORMANCE STATUS: 1-2  Physical Exam  Constitutional: Oriented to person, place, and time and chronically ill-appearing female and in no distress. HENT: Head: Normocephalic and atraumatic. Mouth/Throat: Oropharynx is clear and moist. No oropharyngeal exudate. Eyes: Conjunctivae are normal. Right eye exhibits no discharge. Left eye exhibits no discharge. No scleral icterus. Neck: Normal range of motion. Neck supple. Cardiovascular: Normal rate, regular rhythm, normal heart sounds and intact distal pulses.   Pulmonary/Chest: Effort normal.  Quiet breath sounds bilaterally. No respiratory distress. No wheezes. No rales. Abdominal: Soft. Bowel sounds are normal. Exhibits no distension and no mass. There is no tenderness.  Musculoskeletal: Normal range of  motion. Exhibits no edema.  Lymphadenopathy:    No cervical adenopathy.  Neurological: Alert and oriented to person, place, and time. Exhibits muscle wasting.  Ambulates with a walker. Skin: Skin is warm and dry. No rash noted. Not diaphoretic. No erythema. No pallor.  Psychiatric: Mood, memory and judgment normal. Vitals reviewed.  LABORATORY DATA: Lab Results  Component Value Date   WBC 1.0 (L) 04/06/2021   HGB 9.6 (L) 04/06/2021   HCT 29.6 (L) 04/06/2021   MCV 88.6 04/06/2021   PLT 161 04/06/2021      Chemistry      Component Value Date/Time   NA 133 (L) 04/06/2021 0839   NA 137 08/20/2015 1232   K 3.9 04/06/2021 0839   K 4.3 08/20/2015 1232   CL 99 04/06/2021 0839   CO2 26 04/06/2021 0839   CO2 25 08/20/2015 1232   BUN 13 04/06/2021 0839   BUN 10.4 08/20/2015 1232   CREATININE 1.00 04/06/2021 0839   CREATININE 1.1 08/20/2015 1232      Component Value Date/Time   CALCIUM 9.1 04/06/2021 0839   CALCIUM 10.0 08/20/2015 1232   ALKPHOS 75 04/06/2021 0839   ALKPHOS 142 08/20/2015 1232   AST 12 (L) 04/06/2021 0839   AST 18 08/20/2015 1232   ALT <6 04/06/2021 0839   ALT 11 08/20/2015 1232   BILITOT 0.6 04/06/2021 0839   BILITOT 0.32 08/20/2015 1232       RADIOGRAPHIC STUDIES:  No results found.   ASSESSMENT/PLAN:  This is a very pleasant 80 year old Caucasian female diagnosed with stage IIIb (T3, N2, Mx) non-small cell lung cancer, squamous cell carcinoma.  She presented with a large right middle lobe lung mass in addition to subcarinal lymphadenopathy.  She was diagnosed in May 2022.  PD-L1 testing was 95%    The patient is currently undergoing concurrent chemoradiation with carboplatin for an [facility] of 2 and paclitaxel 45 mg per metered square. She is status post 5 cycles.    The last day of radiation is scheduled for 04/23/21.  Labs were reviewed. Her ANC is low at 0.5. Her total WBC is 1.0. She denies signs or symptoms of infection. We will hold her treatment  this week. We will work on obtaining a prior authorization before administering zarxio 300 mcg later today (7/25) and tomorrow (7/26).    We will see her back for a follow up visit in 2 weeks for evaluation before starting cycle #7  I offered IVF today if needed. Her initial pulse was taken after exertion. At rest, her pulse was 104. She drinks  plenty of water at home per her son. She will not get additional IVF today. She was encouraged to continue to hydrate at home. She will have her follow up nutrition appointment on 04/08/21 as scheduled.    The patient was advised to call immediately if she has any concerning symptoms in the interval. The patient voices understanding of current disease status and treatment options and is in agreement with the current care plan. All questions were answered. The patient knows to call the clinic with any problems, questions or concerns. We can certainly see the patient much sooner if necessary    No orders of the defined types were placed in this encounter.    The total time spent in the appointment was 30-39 minutes  [place], PA-C 04/06/21

## 2021-04-02 ENCOUNTER — Ambulatory Visit
Admission: RE | Admit: 2021-04-02 | Discharge: 2021-04-02 | Disposition: A | Discharge status: Home / Self Care | Payer: Medicare Other | Source: Ambulatory Visit | Attending: Radiation Oncology | Admitting: Radiation Oncology

## 2021-04-02 DIAGNOSIS — J449 Chronic obstructive pulmonary disease, unspecified: Secondary | ICD-10-CM | POA: Diagnosis not present

## 2021-04-02 DIAGNOSIS — R627 Adult failure to thrive: Secondary | ICD-10-CM | POA: Diagnosis not present

## 2021-04-02 DIAGNOSIS — C3491 Malignant neoplasm of unspecified part of right bronchus or lung: Secondary | ICD-10-CM | POA: Diagnosis not present

## 2021-04-02 DIAGNOSIS — E1169 Type 2 diabetes mellitus with other specified complication: Secondary | ICD-10-CM | POA: Diagnosis not present

## 2021-04-02 DIAGNOSIS — D381 Neoplasm of uncertain behavior of trachea, bronchus and lung: Secondary | ICD-10-CM | POA: Diagnosis not present

## 2021-04-02 DIAGNOSIS — C50412 Malignant neoplasm of upper-outer quadrant of left female breast: Secondary | ICD-10-CM | POA: Diagnosis not present

## 2021-04-02 DIAGNOSIS — I10 Essential (primary) hypertension: Secondary | ICD-10-CM | POA: Diagnosis not present

## 2021-04-02 DIAGNOSIS — E785 Hyperlipidemia, unspecified: Secondary | ICD-10-CM | POA: Diagnosis not present

## 2021-04-02 DIAGNOSIS — M1990 Unspecified osteoarthritis, unspecified site: Secondary | ICD-10-CM | POA: Diagnosis not present

## 2021-04-02 DIAGNOSIS — E1165 Type 2 diabetes mellitus with hyperglycemia: Secondary | ICD-10-CM | POA: Diagnosis not present

## 2021-04-02 DIAGNOSIS — E876 Hypokalemia: Secondary | ICD-10-CM | POA: Diagnosis not present

## 2021-04-02 DIAGNOSIS — R413 Other amnesia: Secondary | ICD-10-CM | POA: Diagnosis not present

## 2021-04-02 DIAGNOSIS — D630 Anemia in neoplastic disease: Secondary | ICD-10-CM | POA: Diagnosis not present

## 2021-04-02 DIAGNOSIS — U071 COVID-19: Secondary | ICD-10-CM | POA: Diagnosis not present

## 2021-04-02 DIAGNOSIS — C342 Malignant neoplasm of middle lobe, bronchus or lung: Secondary | ICD-10-CM | POA: Diagnosis not present

## 2021-04-03 ENCOUNTER — Other Ambulatory Visit: Payer: Self-pay

## 2021-04-03 ENCOUNTER — Ambulatory Visit
Admission: RE | Admit: 2021-04-03 | Discharge: 2021-04-03 | Disposition: A | Discharge status: Home / Self Care | Payer: Medicare Other | Source: Ambulatory Visit | Attending: Radiation Oncology | Admitting: Radiation Oncology

## 2021-04-03 DIAGNOSIS — C342 Malignant neoplasm of middle lobe, bronchus or lung: Secondary | ICD-10-CM | POA: Diagnosis not present

## 2021-04-03 DIAGNOSIS — C3491 Malignant neoplasm of unspecified part of right bronchus or lung: Secondary | ICD-10-CM | POA: Diagnosis not present

## 2021-04-03 MED FILL — Dexamethasone Sodium Phosphate Inj 100 MG/10ML: INTRAMUSCULAR | Qty: 2 | Status: AC

## 2021-04-06 ENCOUNTER — Inpatient Hospital Stay: Payer: Medicare Other

## 2021-04-06 ENCOUNTER — Other Ambulatory Visit: Payer: Self-pay | Admitting: Physician Assistant

## 2021-04-06 ENCOUNTER — Ambulatory Visit
Admission: RE | Admit: 2021-04-06 | Discharge: 2021-04-06 | Disposition: A | Discharge status: Home / Self Care | Payer: Medicare Other | Source: Ambulatory Visit | Attending: Radiation Oncology | Admitting: Radiation Oncology

## 2021-04-06 ENCOUNTER — Other Ambulatory Visit: Payer: Self-pay

## 2021-04-06 ENCOUNTER — Encounter: Payer: Self-pay | Admitting: Physician Assistant

## 2021-04-06 ENCOUNTER — Inpatient Hospital Stay (HOSPITAL_BASED_OUTPATIENT_CLINIC_OR_DEPARTMENT_OTHER): Payer: Medicare Other | Admitting: Physician Assistant

## 2021-04-06 VITALS — BP 100/66 | HR 104 | Temp 98.3°F | Resp 18 | Ht 67.0 in | Wt 113.1 lb

## 2021-04-06 VITALS — BP 116/86 | HR 98 | Temp 98.4°F | Resp 18

## 2021-04-06 DIAGNOSIS — F17210 Nicotine dependence, cigarettes, uncomplicated: Secondary | ICD-10-CM | POA: Diagnosis not present

## 2021-04-06 DIAGNOSIS — R498 Other voice and resonance disorders: Secondary | ICD-10-CM | POA: Diagnosis not present

## 2021-04-06 DIAGNOSIS — T451X5A Adverse effect of antineoplastic and immunosuppressive drugs, initial encounter: Secondary | ICD-10-CM | POA: Diagnosis not present

## 2021-04-06 DIAGNOSIS — E860 Dehydration: Secondary | ICD-10-CM

## 2021-04-06 DIAGNOSIS — Z79899 Other long term (current) drug therapy: Secondary | ICD-10-CM | POA: Diagnosis not present

## 2021-04-06 DIAGNOSIS — J449 Chronic obstructive pulmonary disease, unspecified: Secondary | ICD-10-CM | POA: Diagnosis not present

## 2021-04-06 DIAGNOSIS — C3491 Malignant neoplasm of unspecified part of right bronchus or lung: Secondary | ICD-10-CM | POA: Diagnosis not present

## 2021-04-06 DIAGNOSIS — Z5111 Encounter for antineoplastic chemotherapy: Secondary | ICD-10-CM | POA: Diagnosis not present

## 2021-04-06 DIAGNOSIS — C342 Malignant neoplasm of middle lobe, bronchus or lung: Secondary | ICD-10-CM | POA: Diagnosis not present

## 2021-04-06 DIAGNOSIS — R0609 Other forms of dyspnea: Secondary | ICD-10-CM | POA: Diagnosis not present

## 2021-04-06 DIAGNOSIS — D701 Agranulocytosis secondary to cancer chemotherapy: Secondary | ICD-10-CM

## 2021-04-06 DIAGNOSIS — E119 Type 2 diabetes mellitus without complications: Secondary | ICD-10-CM | POA: Diagnosis not present

## 2021-04-06 DIAGNOSIS — R110 Nausea: Secondary | ICD-10-CM | POA: Diagnosis not present

## 2021-04-06 DIAGNOSIS — I10 Essential (primary) hypertension: Secondary | ICD-10-CM | POA: Diagnosis not present

## 2021-04-06 LAB — CMP (CANCER CENTER ONLY)
ALT: <6 U/L (ref 0–44)
AST: 12 U/L — ABNORMAL LOW (ref 15–41)
Albumin: 2.8 g/dL — ABNORMAL LOW (ref 3.5–5.0)
Alkaline Phosphatase: 75 U/L (ref 38–126)
Anion gap: 8 (ref 5–15)
BUN: 13 mg/dL (ref 8–23)
CO2: 26 mmol/L (ref 22–32)
Calcium: 9.1 mg/dL (ref 8.9–10.3)
Chloride: 99 mmol/L (ref 98–111)
Creatinine: 1 mg/dL (ref 0.44–1.00)
GFR, Estimated: 58 mL/min — ABNORMAL LOW (ref >60)
Glucose, Bld: 230 mg/dL — ABNORMAL HIGH (ref 70–99)
Potassium: 3.9 mmol/L (ref 3.5–5.1)
Sodium: 133 mmol/L — ABNORMAL LOW (ref 135–145)
Total Bilirubin: 0.6 mg/dL (ref 0.3–1.2)
Total Protein: 5.8 g/dL — ABNORMAL LOW (ref 6.5–8.1)

## 2021-04-06 LAB — CBC WITH DIFFERENTIAL (CANCER CENTER ONLY)
Abs Immature Granulocytes: 0 10*3/uL (ref 0.00–0.07)
Basophils Absolute: 0 10*3/uL (ref 0.0–0.1)
Basophils Relative: 0 %
Eosinophils Absolute: 0 10*3/uL (ref 0.0–0.5)
Eosinophils Relative: 1 %
HCT: 29.6 % — ABNORMAL LOW (ref 36.0–46.0)
Hemoglobin: 9.6 g/dL — ABNORMAL LOW (ref 12.0–15.0)
Immature Granulocytes: 0 %
Lymphocytes Relative: 38 %
Lymphs Abs: 0.4 10*3/uL — ABNORMAL LOW (ref 0.7–4.0)
MCH: 28.7 pg (ref 26.0–34.0)
MCHC: 32.4 g/dL (ref 30.0–36.0)
MCV: 88.6 fL (ref 80.0–100.0)
Monocytes Absolute: 0.1 10*3/uL (ref 0.1–1.0)
Monocytes Relative: 12 %
Neutro Abs: 0.5 10*3/uL — ABNORMAL LOW (ref 1.7–7.7)
Neutrophils Relative %: 49 %
Platelet Count: 161 10*3/uL (ref 150–400)
RBC: 3.34 MIL/uL — ABNORMAL LOW (ref 3.87–5.11)
RDW: 17.9 % — ABNORMAL HIGH (ref 11.5–15.5)
WBC Count: 1 10*3/uL — ABNORMAL LOW (ref 4.0–10.5)
nRBC: 0 % (ref 0.0–0.2)

## 2021-04-06 MED ORDER — FILGRASTIM-SNDZ 300 MCG/0.5ML IJ SOSY
300.0000 ug | PREFILLED_SYRINGE | Freq: Once | INTRAMUSCULAR | Status: AC
  Administered 2021-04-06: 300 ug via SUBCUTANEOUS

## 2021-04-06 MED ORDER — FILGRASTIM-SNDZ 300 MCG/0.5ML IJ SOSY
PREFILLED_SYRINGE | INTRAMUSCULAR | Status: AC
  Filled 2021-04-06: qty 0.5

## 2021-04-06 NOTE — Patient Instructions (Signed)
Filgrastim, G-CSF injection What is this medication? FILGRASTIM, G-CSF (fil GRA stim) is a granulocyte colony-stimulating factor that stimulates the growth of neutrophils, a type of white blood cell (WBC) important in the body's fight against infection. It is used to reduce the incidence of fever and infection in patients with certain types of cancer who are receiving chemotherapy that affects the bone marrow, to stimulate blood cell production for removal of WBCs from the body prior to a bone marrow transplantation, to reduce the incidence of fever and infection in patients who have severe chronic neutropenia, and to improve survival outcomes following high-dose radiation exposure that is toxic to the bone marrow. This medicine may be used for other purposes; ask your health care provider or pharmacist if you have questions. COMMON BRAND NAME(S): Neupogen, Nivestym, Releuko, Zarxio What should I tell my care team before I take this medication? They need to know if you have any of these conditions: kidney disease latex allergy ongoing radiation therapy sickle cell disease an unusual or allergic reaction to filgrastim, pegfilgrastim, other medicines, foods, dyes, or preservatives pregnant or trying to get pregnant breast-feeding How should I use this medication? This medicine is for injection under the skin or infusion into a vein. As an infusion into a vein, it is usually given by a health care professional in a hospital or clinic setting. If you get this medicine at home, you will be taught how to prepare and give this medicine. Refer to the Instructions for Use that come with your medication packaging. Use exactly as directed. Take your medicine at regular intervals. Do not take your medicine more often than directed. It is important that you put your used needles and syringes in a special sharps container. Do not put them in a trash can. If you do not have a sharps container, call your pharmacist  or healthcare provider to get one. Talk to your pediatrician regarding the use of this medicine in children. While this drug may be prescribed for children as young as 7 months for selected conditions, precautions do apply. Overdosage: If you think you have taken too much of this medicine contact a poison control center or emergency room at once. NOTE: This medicine is only for you. Do not share this medicine with others. What if I miss a dose? It is important not to miss your dose. Call your doctor or health care professional if you miss a dose. What may interact with this medication? This medicine may interact with the following medications: medicines that may cause a release of neutrophils, such as lithium This list may not describe all possible interactions. Give your health care provider a list of all the medicines, herbs, non-prescription drugs, or dietary supplements you use. Also tell them if you smoke, drink alcohol, or use illegal drugs. Some items may interact with your medicine. What should I watch for while using this medication? Your condition will be monitored carefully while you are receiving this medicine. You may need blood work done while you are taking this medicine. Talk to your health care provider about your risk of cancer. You may be more at risk for certain types of cancer if you take this medicine. What side effects may I notice from receiving this medication? Side effects that you should report to your doctor or health care professional as soon as possible: allergic reactions like skin rash, itching or hives, swelling of the face, lips, or tongue back pain dizziness or feeling faint fever pain, redness, or   irritation at site where injected pinpoint red spots on the skin shortness of breath or breathing problems signs and symptoms of kidney injury like trouble passing urine, change in the amount of urine, or red or dark-brown urine stomach or side pain, or pain at  the shoulder swelling tiredness unusual bleeding or bruising Side effects that usually do not require medical attention (report to your doctor or health care professional if they continue or are bothersome): bone pain cough diarrhea hair loss headache muscle pain This list may not describe all possible side effects. Call your doctor for medical advice about side effects. You may report side effects to FDA at 1-800-FDA-1088. Where should I keep my medication? Keep out of the reach of children. Store in a refrigerator between 2 and 8 degrees C (36 and 46 degrees F). Do not freeze. Keep in carton to protect from light. Throw away this medicine if vials or syringes are left out of the refrigerator for more than 24 hours. Throw away any unused medicine after the expiration date. NOTE: This sheet is a summary. It may not cover all possible information. If you have questions about this medicine, talk to your doctor, pharmacist, or health care provider.  2022 Elsevier/Gold Standard (2019-09-20 18:47:55)  

## 2021-04-07 ENCOUNTER — Inpatient Hospital Stay: Payer: Medicare Other

## 2021-04-07 ENCOUNTER — Ambulatory Visit
Admission: RE | Admit: 2021-04-07 | Discharge: 2021-04-07 | Disposition: A | Discharge status: Home / Self Care | Payer: Medicare Other | Source: Ambulatory Visit | Attending: Radiation Oncology | Admitting: Radiation Oncology

## 2021-04-07 VITALS — BP 113/64 | HR 99 | Temp 98.2°F | Resp 20

## 2021-04-07 DIAGNOSIS — C3491 Malignant neoplasm of unspecified part of right bronchus or lung: Secondary | ICD-10-CM | POA: Diagnosis not present

## 2021-04-07 DIAGNOSIS — R110 Nausea: Secondary | ICD-10-CM | POA: Diagnosis not present

## 2021-04-07 DIAGNOSIS — J449 Chronic obstructive pulmonary disease, unspecified: Secondary | ICD-10-CM | POA: Diagnosis not present

## 2021-04-07 DIAGNOSIS — M1990 Unspecified osteoarthritis, unspecified site: Secondary | ICD-10-CM | POA: Diagnosis not present

## 2021-04-07 DIAGNOSIS — D630 Anemia in neoplastic disease: Secondary | ICD-10-CM | POA: Diagnosis not present

## 2021-04-07 DIAGNOSIS — Z5111 Encounter for antineoplastic chemotherapy: Secondary | ICD-10-CM | POA: Diagnosis not present

## 2021-04-07 DIAGNOSIS — E785 Hyperlipidemia, unspecified: Secondary | ICD-10-CM | POA: Diagnosis not present

## 2021-04-07 DIAGNOSIS — R413 Other amnesia: Secondary | ICD-10-CM | POA: Diagnosis not present

## 2021-04-07 DIAGNOSIS — C342 Malignant neoplasm of middle lobe, bronchus or lung: Secondary | ICD-10-CM | POA: Diagnosis not present

## 2021-04-07 DIAGNOSIS — R498 Other voice and resonance disorders: Secondary | ICD-10-CM | POA: Diagnosis not present

## 2021-04-07 DIAGNOSIS — D701 Agranulocytosis secondary to cancer chemotherapy: Secondary | ICD-10-CM

## 2021-04-07 DIAGNOSIS — F17210 Nicotine dependence, cigarettes, uncomplicated: Secondary | ICD-10-CM | POA: Diagnosis not present

## 2021-04-07 DIAGNOSIS — R627 Adult failure to thrive: Secondary | ICD-10-CM | POA: Diagnosis not present

## 2021-04-07 DIAGNOSIS — C50412 Malignant neoplasm of upper-outer quadrant of left female breast: Secondary | ICD-10-CM | POA: Diagnosis not present

## 2021-04-07 DIAGNOSIS — U071 COVID-19: Secondary | ICD-10-CM | POA: Diagnosis not present

## 2021-04-07 DIAGNOSIS — R0609 Other forms of dyspnea: Secondary | ICD-10-CM | POA: Diagnosis not present

## 2021-04-07 DIAGNOSIS — E1165 Type 2 diabetes mellitus with hyperglycemia: Secondary | ICD-10-CM | POA: Diagnosis not present

## 2021-04-07 DIAGNOSIS — E1169 Type 2 diabetes mellitus with other specified complication: Secondary | ICD-10-CM | POA: Diagnosis not present

## 2021-04-07 DIAGNOSIS — E119 Type 2 diabetes mellitus without complications: Secondary | ICD-10-CM | POA: Diagnosis not present

## 2021-04-07 DIAGNOSIS — D381 Neoplasm of uncertain behavior of trachea, bronchus and lung: Secondary | ICD-10-CM | POA: Diagnosis not present

## 2021-04-07 DIAGNOSIS — E876 Hypokalemia: Secondary | ICD-10-CM | POA: Diagnosis not present

## 2021-04-07 DIAGNOSIS — I10 Essential (primary) hypertension: Secondary | ICD-10-CM | POA: Diagnosis not present

## 2021-04-07 DIAGNOSIS — Z79899 Other long term (current) drug therapy: Secondary | ICD-10-CM | POA: Diagnosis not present

## 2021-04-07 LAB — ACID FAST CULTURE WITH REFLEXED SENSITIVITIES (MYCOBACTERIA): Acid Fast Culture: NEGATIVE

## 2021-04-07 MED ORDER — FILGRASTIM-SNDZ 300 MCG/0.5ML IJ SOSY
300.0000 ug | PREFILLED_SYRINGE | Freq: Once | INTRAMUSCULAR | Status: AC
Start: 2021-04-07 — End: 2021-04-07
  Administered 2021-04-07: 300 ug via SUBCUTANEOUS

## 2021-04-07 MED ORDER — FILGRASTIM-SNDZ 300 MCG/0.5ML IJ SOSY
PREFILLED_SYRINGE | INTRAMUSCULAR | Status: AC
  Filled 2021-04-07: qty 0.5

## 2021-04-07 NOTE — Patient Instructions (Signed)
Filgrastim, G-CSF injection What is this medication? FILGRASTIM, G-CSF (fil GRA stim) is a granulocyte colony-stimulating factor that stimulates the growth of neutrophils, a type of white blood cell (WBC) important in the body's fight against infection. It is used to reduce the incidence of fever and infection in patients with certain types of cancer who are receiving chemotherapy that affects the bone marrow, to stimulate blood cell production for removal of WBCs from the body prior to a bone marrow transplantation, to reduce the incidence of fever and infection in patients who have severe chronic neutropenia, and to improve survival outcomes following high-dose radiation exposure that is toxic to the bone marrow. This medicine may be used for other purposes; ask your health care provider or pharmacist if you have questions. COMMON BRAND NAME(S): Neupogen, Nivestym, Releuko, Zarxio What should I tell my care team before I take this medication? They need to know if you have any of these conditions: kidney disease latex allergy ongoing radiation therapy sickle cell disease an unusual or allergic reaction to filgrastim, pegfilgrastim, other medicines, foods, dyes, or preservatives pregnant or trying to get pregnant breast-feeding How should I use this medication? This medicine is for injection under the skin or infusion into a vein. As an infusion into a vein, it is usually given by a health care professional in a hospital or clinic setting. If you get this medicine at home, you will be taught how to prepare and give this medicine. Refer to the Instructions for Use that come with your medication packaging. Use exactly as directed. Take your medicine at regular intervals. Do not take your medicine more often than directed. It is important that you put your used needles and syringes in a special sharps container. Do not put them in a trash can. If you do not have a sharps container, call your pharmacist  or healthcare provider to get one. Talk to your pediatrician regarding the use of this medicine in children. While this drug may be prescribed for children as young as 7 months for selected conditions, precautions do apply. Overdosage: If you think you have taken too much of this medicine contact a poison control center or emergency room at once. NOTE: This medicine is only for you. Do not share this medicine with others. What if I miss a dose? It is important not to miss your dose. Call your doctor or health care professional if you miss a dose. What may interact with this medication? This medicine may interact with the following medications: medicines that may cause a release of neutrophils, such as lithium This list may not describe all possible interactions. Give your health care provider a list of all the medicines, herbs, non-prescription drugs, or dietary supplements you use. Also tell them if you smoke, drink alcohol, or use illegal drugs. Some items may interact with your medicine. What should I watch for while using this medication? Your condition will be monitored carefully while you are receiving this medicine. You may need blood work done while you are taking this medicine. Talk to your health care provider about your risk of cancer. You may be more at risk for certain types of cancer if you take this medicine. What side effects may I notice from receiving this medication? Side effects that you should report to your doctor or health care professional as soon as possible: allergic reactions like skin rash, itching or hives, swelling of the face, lips, or tongue back pain dizziness or feeling faint fever pain, redness, or   irritation at site where injected pinpoint red spots on the skin shortness of breath or breathing problems signs and symptoms of kidney injury like trouble passing urine, change in the amount of urine, or red or dark-brown urine stomach or side pain, or pain at  the shoulder swelling tiredness unusual bleeding or bruising Side effects that usually do not require medical attention (report to your doctor or health care professional if they continue or are bothersome): bone pain cough diarrhea hair loss headache muscle pain This list may not describe all possible side effects. Call your doctor for medical advice about side effects. You may report side effects to FDA at 1-800-FDA-1088. Where should I keep my medication? Keep out of the reach of children. Store in a refrigerator between 2 and 8 degrees C (36 and 46 degrees F). Do not freeze. Keep in carton to protect from light. Throw away this medicine if vials or syringes are left out of the refrigerator for more than 24 hours. Throw away any unused medicine after the expiration date. NOTE: This sheet is a summary. It may not cover all possible information. If you have questions about this medicine, talk to your doctor, pharmacist, or health care provider.  2022 Elsevier/Gold Standard (2019-09-20 18:47:55)  

## 2021-04-08 ENCOUNTER — Other Ambulatory Visit: Payer: Self-pay

## 2021-04-08 ENCOUNTER — Ambulatory Visit
Admission: RE | Admit: 2021-04-08 | Discharge: 2021-04-08 | Disposition: A | Discharge status: Home / Self Care | Payer: Medicare Other | Source: Ambulatory Visit | Attending: Radiation Oncology | Admitting: Radiation Oncology

## 2021-04-08 ENCOUNTER — Inpatient Hospital Stay: Payer: Medicare Other | Admitting: Dietician

## 2021-04-08 DIAGNOSIS — C3491 Malignant neoplasm of unspecified part of right bronchus or lung: Secondary | ICD-10-CM | POA: Diagnosis not present

## 2021-04-08 DIAGNOSIS — D381 Neoplasm of uncertain behavior of trachea, bronchus and lung: Secondary | ICD-10-CM | POA: Diagnosis not present

## 2021-04-08 DIAGNOSIS — M6281 Muscle weakness (generalized): Secondary | ICD-10-CM | POA: Diagnosis not present

## 2021-04-08 DIAGNOSIS — C342 Malignant neoplasm of middle lobe, bronchus or lung: Secondary | ICD-10-CM | POA: Diagnosis not present

## 2021-04-08 DIAGNOSIS — J449 Chronic obstructive pulmonary disease, unspecified: Secondary | ICD-10-CM | POA: Diagnosis not present

## 2021-04-08 DIAGNOSIS — U071 COVID-19: Secondary | ICD-10-CM | POA: Diagnosis not present

## 2021-04-08 NOTE — Progress Notes (Signed)
Nutrition Follow-up:  Patient with stage IIIb non-small cell lung cancer. She is receiving concurrent chemoradiation with carboplatin.   Spoke with patient son [name]) via telephone. He reports patient tolerating treatment well, says patient has complained of nausea a couple of times, denies vomiting. Son reports patient dementia has progressed and she "is never hungry" Patient will "ask for breakfast on a rare occasion." Son keeps a "snack box" by patient's chair with assorted crackers, Ensure, and snacks. He offers food frequently and reminds patient to eat. She is drinking 1 Ensure daily, sometimes she will have 2.    Medications: Namenda, Lantus, Metformin, Compazine, D3  Labs: 7/25- Na 133, Glucose 230  Anthropometrics: Weight 113 lb 1.6 oz on 7/25 stable   7/8 - 113 lb 12.8 oz 7/11 - 114 lb 4.8 oz 7/5 - 115 lb 6/27 110 lb 14.4 oz  NUTRITION DIAGNOSIS: Unintentional weight loss stable     INTERVENTION:  Educated on high protein finger foods, will mail handout with ideas Recommended drinking 2-3 Ensure Plus/equivalent daily as able Will mail Ensure coupons Suggested having set "routine" meals times as able  Discussed ways to add calories and protein to foods, will mail handout    MONITORING, EVALUATION, GOAL: weight trends, intake   NEXT VISIT: Friday August 26 via telephone

## 2021-04-09 ENCOUNTER — Ambulatory Visit
Admission: RE | Admit: 2021-04-09 | Discharge: 2021-04-09 | Disposition: A | Discharge status: Home / Self Care | Payer: Medicare Other | Source: Ambulatory Visit | Attending: Radiation Oncology | Admitting: Radiation Oncology

## 2021-04-09 ENCOUNTER — Telehealth: Payer: Self-pay | Admitting: Physician Assistant

## 2021-04-09 DIAGNOSIS — C3491 Malignant neoplasm of unspecified part of right bronchus or lung: Secondary | ICD-10-CM | POA: Diagnosis not present

## 2021-04-09 DIAGNOSIS — C342 Malignant neoplasm of middle lobe, bronchus or lung: Secondary | ICD-10-CM | POA: Diagnosis not present

## 2021-04-09 NOTE — Progress Notes (Signed)
Patient came around after treatment stating that she had chest pain earlier during treatment .States  that she has a history of chest pain and that her primary doctor is aware . Patient denies having any issues now . Vitals were 115/85 blood pressure,96.8 temporal temp,20 rr  98 oxygen on room air, 100 pulse. States that she had some nausea ,but that she has medication for relief. Advise patient to notify her primary doctor of her chest pains .Since they had been monitoring her. Patient states that she will notify her primary. Son escorted her out.

## 2021-04-10 ENCOUNTER — Ambulatory Visit
Admission: RE | Admit: 2021-04-10 | Discharge: 2021-04-10 | Disposition: A | Discharge status: Home / Self Care | Payer: Medicare Other | Source: Ambulatory Visit | Attending: Radiation Oncology | Admitting: Radiation Oncology

## 2021-04-10 ENCOUNTER — Other Ambulatory Visit: Payer: Self-pay

## 2021-04-10 DIAGNOSIS — C3491 Malignant neoplasm of unspecified part of right bronchus or lung: Secondary | ICD-10-CM | POA: Diagnosis not present

## 2021-04-10 DIAGNOSIS — C342 Malignant neoplasm of middle lobe, bronchus or lung: Secondary | ICD-10-CM | POA: Diagnosis not present

## 2021-04-13 ENCOUNTER — Other Ambulatory Visit: Payer: Self-pay

## 2021-04-13 ENCOUNTER — Inpatient Hospital Stay: Payer: Medicare Other

## 2021-04-13 ENCOUNTER — Inpatient Hospital Stay: Payer: Medicare Other | Attending: Internal Medicine

## 2021-04-13 ENCOUNTER — Ambulatory Visit
Admission: RE | Admit: 2021-04-13 | Discharge: 2021-04-13 | Disposition: A | Discharge status: Home / Self Care | Payer: Medicare Other | Source: Ambulatory Visit | Attending: Radiation Oncology | Admitting: Radiation Oncology

## 2021-04-13 VITALS — BP 121/77 | HR 97 | Temp 98.0°F | Resp 18

## 2021-04-13 DIAGNOSIS — T451X5A Adverse effect of antineoplastic and immunosuppressive drugs, initial encounter: Secondary | ICD-10-CM | POA: Insufficient documentation

## 2021-04-13 DIAGNOSIS — Z5111 Encounter for antineoplastic chemotherapy: Secondary | ICD-10-CM | POA: Diagnosis not present

## 2021-04-13 DIAGNOSIS — D702 Other drug-induced agranulocytosis: Secondary | ICD-10-CM | POA: Diagnosis not present

## 2021-04-13 DIAGNOSIS — C342 Malignant neoplasm of middle lobe, bronchus or lung: Secondary | ICD-10-CM | POA: Diagnosis not present

## 2021-04-13 DIAGNOSIS — Z79899 Other long term (current) drug therapy: Secondary | ICD-10-CM | POA: Diagnosis not present

## 2021-04-13 DIAGNOSIS — E860 Dehydration: Secondary | ICD-10-CM | POA: Insufficient documentation

## 2021-04-13 DIAGNOSIS — C3491 Malignant neoplasm of unspecified part of right bronchus or lung: Secondary | ICD-10-CM | POA: Insufficient documentation

## 2021-04-13 LAB — CMP (CANCER CENTER ONLY)
ALT: 6 U/L (ref 0–44)
AST: 13 U/L — ABNORMAL LOW (ref 15–41)
Albumin: 3.1 g/dL — ABNORMAL LOW (ref 3.5–5.0)
Alkaline Phosphatase: 99 U/L (ref 38–126)
Anion gap: 11 (ref 5–15)
BUN: 13 mg/dL (ref 8–23)
CO2: 25 mmol/L (ref 22–32)
Calcium: 9.7 mg/dL (ref 8.9–10.3)
Chloride: 100 mmol/L (ref 98–111)
Creatinine: 1.06 mg/dL — ABNORMAL HIGH (ref 0.44–1.00)
GFR, Estimated: 54 mL/min — ABNORMAL LOW (ref >60)
Glucose, Bld: 251 mg/dL — ABNORMAL HIGH (ref 70–99)
Potassium: 3.7 mmol/L (ref 3.5–5.1)
Sodium: 136 mmol/L (ref 135–145)
Total Bilirubin: 0.4 mg/dL (ref 0.3–1.2)
Total Protein: 6.5 g/dL (ref 6.5–8.1)

## 2021-04-13 LAB — CBC WITH DIFFERENTIAL (CANCER CENTER ONLY)
Abs Immature Granulocytes: 0.22 10*3/uL — ABNORMAL HIGH (ref 0.00–0.07)
Basophils Absolute: 0.1 10*3/uL (ref 0.0–0.1)
Basophils Relative: 2 %
Eosinophils Absolute: 0 10*3/uL (ref 0.0–0.5)
Eosinophils Relative: 0 %
HCT: 32.2 % — ABNORMAL LOW (ref 36.0–46.0)
Hemoglobin: 10.5 g/dL — ABNORMAL LOW (ref 12.0–15.0)
Immature Granulocytes: 5 %
Lymphocytes Relative: 18 %
Lymphs Abs: 0.9 10*3/uL (ref 0.7–4.0)
MCH: 29.2 pg (ref 26.0–34.0)
MCHC: 32.6 g/dL (ref 30.0–36.0)
MCV: 89.4 fL (ref 80.0–100.0)
Monocytes Absolute: 0.8 10*3/uL (ref 0.1–1.0)
Monocytes Relative: 17 %
Neutro Abs: 2.7 10*3/uL (ref 1.7–7.7)
Neutrophils Relative %: 58 %
Platelet Count: 365 10*3/uL (ref 150–400)
RBC: 3.6 MIL/uL — ABNORMAL LOW (ref 3.87–5.11)
RDW: 20.5 % — ABNORMAL HIGH (ref 11.5–15.5)
Smear Review: ADEQUATE
WBC Count: 4.7 10*3/uL (ref 4.0–10.5)
nRBC: 0 % (ref 0.0–0.2)

## 2021-04-13 MED ORDER — PALONOSETRON HCL INJECTION 0.25 MG/5ML
0.2500 mg | Freq: Once | INTRAVENOUS | Status: AC
  Administered 2021-04-13: 0.25 mg via INTRAVENOUS

## 2021-04-13 MED ORDER — SODIUM CHLORIDE 0.9 % IV SOLN
20.0000 mg | Freq: Once | INTRAVENOUS | Status: AC
  Administered 2021-04-13: 20 mg via INTRAVENOUS
  Filled 2021-04-13: qty 20

## 2021-04-13 MED ORDER — PALONOSETRON HCL INJECTION 0.25 MG/5ML
INTRAVENOUS | Status: AC
  Filled 2021-04-13: qty 5

## 2021-04-13 MED ORDER — DIPHENHYDRAMINE HCL 50 MG/ML IJ SOLN
50.0000 mg | Freq: Once | INTRAMUSCULAR | Status: AC
  Administered 2021-04-13: 50 mg via INTRAVENOUS

## 2021-04-13 MED ORDER — DIPHENHYDRAMINE HCL 50 MG/ML IJ SOLN
INTRAMUSCULAR | Status: AC
  Filled 2021-04-13: qty 1

## 2021-04-13 MED ORDER — SODIUM CHLORIDE 0.9 % IV SOLN
45.0000 mg/m2 | Freq: Once | INTRAVENOUS | Status: AC
  Administered 2021-04-13: 72 mg via INTRAVENOUS
  Filled 2021-04-13: qty 12

## 2021-04-13 MED ORDER — SODIUM CHLORIDE 0.9 % IV SOLN
Freq: Once | INTRAVENOUS | Status: DC
  Filled 2021-04-13: qty 250

## 2021-04-13 MED ORDER — CARBOPLATIN CHEMO INJECTION 450 MG/45ML
110.0000 mg | Freq: Once | INTRAVENOUS | Status: AC
  Administered 2021-04-13: 110 mg via INTRAVENOUS
  Filled 2021-04-13 (×2): qty 11

## 2021-04-13 MED ORDER — SODIUM CHLORIDE 0.9 % IV SOLN
Freq: Once | INTRAVENOUS | Status: AC
  Filled 2021-04-13: qty 250

## 2021-04-13 MED ORDER — FAMOTIDINE 20 MG IN NS 100 ML IVPB
20.0000 mg | Freq: Once | INTRAVENOUS | Status: AC
  Administered 2021-04-13: 20 mg via INTRAVENOUS

## 2021-04-13 MED ORDER — FAMOTIDINE 20 MG IN NS 100 ML IVPB
INTRAVENOUS | Status: AC
  Filled 2021-04-13: qty 100

## 2021-04-13 NOTE — Progress Notes (Signed)
Pt presented to clinic after radiation treatment. Pt stated feeling dizzy. Pt blood pressure was 89/59. Pt has not been eating/drinking well today. Dr. Lindi Adie and desk nurse notified. Pt is going to labs and infusion. Per Dr. Lindi Adie, pt will get 1L of fluids during chemo today.

## 2021-04-13 NOTE — Progress Notes (Signed)
Reduce Carboplatin to 110mg  (AUC 1.8).  Raul Del [place], [place], BCPS, BCOP 04/13/2021 11:56 AM

## 2021-04-13 NOTE — Patient Instructions (Signed)
[facility]  Discharge Instructions: Thank you for choosing [facility] to provide your oncology and hematology care.   If you have a lab appointment with the [facility], please go directly to the [facility] and check in at the registration area.   Wear comfortable clothing and clothing appropriate for easy access to any Portacath or PICC line.   We strive to give you quality time with your provider. You may need to reschedule your appointment if you arrive late (15 or more minutes).  Arriving late affects you and other patients whose appointments are after yours.  Also, if you miss three or more appointments without notifying the office, you may be dismissed from the clinic at the provider's discretion.      For prescription refill requests, have your pharmacy contact our office and allow 72 hours for refills to be completed.    Today you received the following chemotherapy and/or immunotherapy agents taxol/carboplatin     To help prevent nausea and vomiting after your treatment, we encourage you to take your nausea medication as directed.  BELOW ARE SYMPTOMS THAT SHOULD BE REPORTED IMMEDIATELY: *FEVER GREATER THAN 100.4 F (38 C) OR HIGHER *CHILLS OR SWEATING *NAUSEA AND VOMITING THAT IS NOT CONTROLLED WITH YOUR NAUSEA MEDICATION *UNUSUAL SHORTNESS OF BREATH *UNUSUAL BRUISING OR BLEEDING *URINARY PROBLEMS (pain or burning when urinating, or frequent urination) *BOWEL PROBLEMS (unusual diarrhea, constipation, pain near the anus) TENDERNESS IN MOUTH AND THROAT WITH OR WITHOUT PRESENCE OF ULCERS (sore throat, sores in mouth, or a toothache) UNUSUAL RASH, SWELLING OR PAIN  UNUSUAL VAGINAL DISCHARGE OR ITCHING   Items with * indicate a potential emergency and should be followed up as soon as possible or go to the Emergency Department if any problems should occur.  Please show the CHEMOTHERAPY ALERT CARD or IMMUNOTHERAPY ALERT CARD at  check-in to the Emergency Department and triage nurse.  Should you have questions after your visit or need to cancel or reschedule your appointment, please contact [facility]  Dept: [phone]  and follow the prompts.  Office hours are 8:00 a.m. to 4:30 p.m. Monday - Friday. Please note that voicemails left after 4:00 p.m. may not be returned until the following business day.  We are closed weekends and major holidays. You have access to a nurse at all times for urgent questions. Please call the main number to the clinic Dept: [phone] and follow the prompts.   For any non-urgent questions, you may also contact your provider using MyChart. We now offer e-Visits for anyone 20 and older to request care online for non-urgent symptoms. For details visit [email or web address].   Also download the MyChart app! Go to the app store, search "MyChart", open the app, select [facility], and log in with your MyChart username and password.  Due to Covid, a mask is required upon entering the hospital/clinic. If you do not have a mask, one will be given to you upon arrival. For doctor visits, patients may have 1 support person aged 41 or older with them. For treatment visits, patients cannot have anyone with them due to current Covid guidelines and our immunocompromised population.

## 2021-04-14 ENCOUNTER — Ambulatory Visit
Admission: RE | Admit: 2021-04-14 | Discharge: 2021-04-14 | Disposition: A | Discharge status: Home / Self Care | Payer: Medicare Other | Source: Ambulatory Visit | Attending: Radiation Oncology | Admitting: Radiation Oncology

## 2021-04-14 DIAGNOSIS — C342 Malignant neoplasm of middle lobe, bronchus or lung: Secondary | ICD-10-CM | POA: Diagnosis not present

## 2021-04-14 DIAGNOSIS — C3491 Malignant neoplasm of unspecified part of right bronchus or lung: Secondary | ICD-10-CM | POA: Diagnosis not present

## 2021-04-15 ENCOUNTER — Ambulatory Visit
Admission: RE | Admit: 2021-04-15 | Discharge: 2021-04-15 | Disposition: A | Discharge status: Home / Self Care | Payer: Medicare Other | Source: Ambulatory Visit | Attending: Radiation Oncology | Admitting: Radiation Oncology

## 2021-04-15 ENCOUNTER — Other Ambulatory Visit: Payer: Self-pay

## 2021-04-15 DIAGNOSIS — C3491 Malignant neoplasm of unspecified part of right bronchus or lung: Secondary | ICD-10-CM | POA: Diagnosis not present

## 2021-04-15 DIAGNOSIS — C342 Malignant neoplasm of middle lobe, bronchus or lung: Secondary | ICD-10-CM | POA: Diagnosis not present

## 2021-04-16 ENCOUNTER — Ambulatory Visit
Admission: RE | Admit: 2021-04-16 | Discharge: 2021-04-16 | Disposition: A | Discharge status: Home / Self Care | Payer: Medicare Other | Source: Ambulatory Visit | Attending: Radiation Oncology | Admitting: Radiation Oncology

## 2021-04-16 DIAGNOSIS — C3491 Malignant neoplasm of unspecified part of right bronchus or lung: Secondary | ICD-10-CM | POA: Diagnosis not present

## 2021-04-16 DIAGNOSIS — C342 Malignant neoplasm of middle lobe, bronchus or lung: Secondary | ICD-10-CM | POA: Diagnosis not present

## 2021-04-17 ENCOUNTER — Ambulatory Visit
Admission: RE | Admit: 2021-04-17 | Discharge: 2021-04-17 | Disposition: A | Discharge status: Home / Self Care | Payer: Medicare Other | Source: Ambulatory Visit | Attending: Radiation Oncology | Admitting: Radiation Oncology

## 2021-04-17 DIAGNOSIS — C342 Malignant neoplasm of middle lobe, bronchus or lung: Secondary | ICD-10-CM | POA: Diagnosis not present

## 2021-04-17 DIAGNOSIS — C3491 Malignant neoplasm of unspecified part of right bronchus or lung: Secondary | ICD-10-CM | POA: Diagnosis not present

## 2021-04-20 ENCOUNTER — Inpatient Hospital Stay: Payer: Medicare Other

## 2021-04-20 ENCOUNTER — Ambulatory Visit
Admission: RE | Admit: 2021-04-20 | Discharge: 2021-04-20 | Disposition: A | Discharge status: Home / Self Care | Payer: Medicare Other | Source: Ambulatory Visit | Attending: Radiation Oncology | Admitting: Radiation Oncology

## 2021-04-20 ENCOUNTER — Other Ambulatory Visit: Payer: Self-pay

## 2021-04-20 VITALS — BP 104/61 | HR 98 | Temp 97.6°F | Resp 18 | Wt 111.8 lb

## 2021-04-20 DIAGNOSIS — C342 Malignant neoplasm of middle lobe, bronchus or lung: Secondary | ICD-10-CM | POA: Diagnosis not present

## 2021-04-20 DIAGNOSIS — T451X5A Adverse effect of antineoplastic and immunosuppressive drugs, initial encounter: Secondary | ICD-10-CM | POA: Diagnosis not present

## 2021-04-20 DIAGNOSIS — E860 Dehydration: Secondary | ICD-10-CM | POA: Diagnosis not present

## 2021-04-20 DIAGNOSIS — D702 Other drug-induced agranulocytosis: Secondary | ICD-10-CM | POA: Diagnosis not present

## 2021-04-20 DIAGNOSIS — Z79899 Other long term (current) drug therapy: Secondary | ICD-10-CM | POA: Diagnosis not present

## 2021-04-20 DIAGNOSIS — Z5111 Encounter for antineoplastic chemotherapy: Secondary | ICD-10-CM | POA: Diagnosis not present

## 2021-04-20 DIAGNOSIS — C3491 Malignant neoplasm of unspecified part of right bronchus or lung: Secondary | ICD-10-CM | POA: Diagnosis not present

## 2021-04-20 LAB — CMP (CANCER CENTER ONLY)
ALT: 8 U/L (ref 0–44)
AST: 13 U/L — ABNORMAL LOW (ref 15–41)
Albumin: 3.1 g/dL — ABNORMAL LOW (ref 3.5–5.0)
Alkaline Phosphatase: 106 U/L (ref 38–126)
Anion gap: 11 (ref 5–15)
BUN: 13 mg/dL (ref 8–23)
CO2: 27 mmol/L (ref 22–32)
Calcium: 9.7 mg/dL (ref 8.9–10.3)
Chloride: 97 mmol/L — ABNORMAL LOW (ref 98–111)
Creatinine: 1.13 mg/dL — ABNORMAL HIGH (ref 0.44–1.00)
GFR, Estimated: 50 mL/min — ABNORMAL LOW (ref >60)
Glucose, Bld: 287 mg/dL — ABNORMAL HIGH (ref 70–99)
Potassium: 4 mmol/L (ref 3.5–5.1)
Sodium: 135 mmol/L (ref 135–145)
Total Bilirubin: 0.4 mg/dL (ref 0.3–1.2)
Total Protein: 6.5 g/dL (ref 6.5–8.1)

## 2021-04-20 LAB — CBC WITH DIFFERENTIAL (CANCER CENTER ONLY)
Abs Immature Granulocytes: 0.02 10*3/uL (ref 0.00–0.07)
Basophils Absolute: 0 10*3/uL (ref 0.0–0.1)
Basophils Relative: 0 %
Eosinophils Absolute: 0 10*3/uL (ref 0.0–0.5)
Eosinophils Relative: 0 %
HCT: 31.1 % — ABNORMAL LOW (ref 36.0–46.0)
Hemoglobin: 10.1 g/dL — ABNORMAL LOW (ref 12.0–15.0)
Immature Granulocytes: 0 %
Lymphocytes Relative: 7 %
Lymphs Abs: 0.3 10*3/uL — ABNORMAL LOW (ref 0.7–4.0)
MCH: 29.1 pg (ref 26.0–34.0)
MCHC: 32.5 g/dL (ref 30.0–36.0)
MCV: 89.6 fL (ref 80.0–100.0)
Monocytes Absolute: 0.3 10*3/uL (ref 0.1–1.0)
Monocytes Relative: 7 %
Neutro Abs: 4.1 10*3/uL (ref 1.7–7.7)
Neutrophils Relative %: 86 %
Platelet Count: 341 10*3/uL (ref 150–400)
RBC: 3.47 MIL/uL — ABNORMAL LOW (ref 3.87–5.11)
RDW: 20.5 % — ABNORMAL HIGH (ref 11.5–15.5)
WBC Count: 4.8 10*3/uL (ref 4.0–10.5)
nRBC: 0 % (ref 0.0–0.2)

## 2021-04-20 MED ORDER — SODIUM CHLORIDE 0.9 % IV SOLN
45.0000 mg/m2 | Freq: Once | INTRAVENOUS | Status: AC
  Administered 2021-04-20: 72 mg via INTRAVENOUS
  Filled 2021-04-20: qty 12

## 2021-04-20 MED ORDER — DIPHENHYDRAMINE HCL 50 MG/ML IJ SOLN
50.0000 mg | Freq: Once | INTRAMUSCULAR | Status: AC
  Administered 2021-04-20: 50 mg via INTRAVENOUS

## 2021-04-20 MED ORDER — PALONOSETRON HCL INJECTION 0.25 MG/5ML
INTRAVENOUS | Status: AC
  Filled 2021-04-20: qty 5

## 2021-04-20 MED ORDER — PALONOSETRON HCL INJECTION 0.25 MG/5ML
0.2500 mg | Freq: Once | INTRAVENOUS | Status: AC
  Administered 2021-04-20: 0.25 mg via INTRAVENOUS

## 2021-04-20 MED ORDER — DIPHENHYDRAMINE HCL 50 MG/ML IJ SOLN
INTRAMUSCULAR | Status: AC
  Filled 2021-04-20: qty 1

## 2021-04-20 MED ORDER — FAMOTIDINE 20 MG IN NS 100 ML IVPB
20.0000 mg | Freq: Once | INTRAVENOUS | Status: AC
  Administered 2021-04-20: 20 mg via INTRAVENOUS

## 2021-04-20 MED ORDER — FAMOTIDINE 20 MG IN NS 100 ML IVPB
INTRAVENOUS | Status: AC
  Filled 2021-04-20: qty 100

## 2021-04-20 MED ORDER — SODIUM CHLORIDE 0.9 % IV SOLN
110.0000 mg | Freq: Once | INTRAVENOUS | Status: AC
  Administered 2021-04-20: 110 mg via INTRAVENOUS
  Filled 2021-04-20: qty 11

## 2021-04-20 MED ORDER — SODIUM CHLORIDE 0.9 % IV SOLN
Freq: Once | INTRAVENOUS | Status: AC
  Filled 2021-04-20: qty 250

## 2021-04-20 MED ORDER — SODIUM CHLORIDE 0.9 % IV SOLN
20.0000 mg | Freq: Once | INTRAVENOUS | Status: AC
  Administered 2021-04-20: 20 mg via INTRAVENOUS
  Filled 2021-04-20: qty 20
  Filled 2021-04-20: qty 2

## 2021-04-20 NOTE — Progress Notes (Signed)
CBC w/Diff and CMP drawn with PIV placement and sent to lab for processing.

## 2021-04-20 NOTE — Patient Instructions (Signed)
[facility]  Discharge Instructions: Thank you for choosing [facility] to provide your oncology and hematology care.   If you have a lab appointment with the [facility], please go directly to the [facility] and check in at the registration area.   Wear comfortable clothing and clothing appropriate for easy access to any Portacath or PICC line.   We strive to give you quality time with your provider. You may need to reschedule your appointment if you arrive late (15 or more minutes).  Arriving late affects you and other patients whose appointments are after yours.  Also, if you miss three or more appointments without notifying the office, you may be dismissed from the clinic at the provider's discretion.      For prescription refill requests, have your pharmacy contact our office and allow 72 hours for refills to be completed.    Today you received the following chemotherapy and/or immunotherapy agents Paclitaxel and Carboplatin.      To help prevent nausea and vomiting after your treatment, we encourage you to take your nausea medication as directed.  BELOW ARE SYMPTOMS THAT SHOULD BE REPORTED IMMEDIATELY: *FEVER GREATER THAN 100.4 F (38 C) OR HIGHER *CHILLS OR SWEATING *NAUSEA AND VOMITING THAT IS NOT CONTROLLED WITH YOUR NAUSEA MEDICATION *UNUSUAL SHORTNESS OF BREATH *UNUSUAL BRUISING OR BLEEDING *URINARY PROBLEMS (pain or burning when urinating, or frequent urination) *BOWEL PROBLEMS (unusual diarrhea, constipation, pain near the anus) TENDERNESS IN MOUTH AND THROAT WITH OR WITHOUT PRESENCE OF ULCERS (sore throat, sores in mouth, or a toothache) UNUSUAL RASH, SWELLING OR PAIN  UNUSUAL VAGINAL DISCHARGE OR ITCHING   Items with * indicate a potential emergency and should be followed up as soon as possible or go to the Emergency Department if any problems should occur.  Please show the CHEMOTHERAPY ALERT CARD or IMMUNOTHERAPY ALERT  CARD at check-in to the Emergency Department and triage nurse.  Should you have questions after your visit or need to cancel or reschedule your appointment, please contact [facility]  Dept: [phone]  and follow the prompts.  Office hours are 8:00 a.m. to 4:30 p.m. Monday - Friday. Please note that voicemails left after 4:00 p.m. may not be returned until the following business day.  We are closed weekends and major holidays. You have access to a nurse at all times for urgent questions. Please call the main number to the clinic Dept: [phone] and follow the prompts.   For any non-urgent questions, you may also contact your provider using MyChart. We now offer e-Visits for anyone 47 and older to request care online for non-urgent symptoms. For details visit [email or web address].   Also download the MyChart app! Go to the app store, search "MyChart", open the app, select [facility], and log in with your MyChart username and password.  Due to Covid, a mask is required upon entering the hospital/clinic. If you do not have a mask, one will be given to you upon arrival. For doctor visits, patients may have 1 support person aged 57 or older with them. For treatment visits, patients cannot have anyone with them due to current Covid guidelines and our immunocompromised population.

## 2021-04-21 ENCOUNTER — Ambulatory Visit
Admission: RE | Admit: 2021-04-21 | Discharge: 2021-04-21 | Disposition: A | Discharge status: Home / Self Care | Payer: Medicare Other | Source: Ambulatory Visit | Attending: Radiation Oncology | Admitting: Radiation Oncology

## 2021-04-21 DIAGNOSIS — C342 Malignant neoplasm of middle lobe, bronchus or lung: Secondary | ICD-10-CM | POA: Diagnosis not present

## 2021-04-21 DIAGNOSIS — C3491 Malignant neoplasm of unspecified part of right bronchus or lung: Secondary | ICD-10-CM | POA: Diagnosis not present

## 2021-04-22 ENCOUNTER — Ambulatory Visit
Admission: RE | Admit: 2021-04-22 | Discharge: 2021-04-22 | Disposition: A | Discharge status: Home / Self Care | Payer: Medicare Other | Source: Ambulatory Visit | Attending: Radiation Oncology | Admitting: Radiation Oncology

## 2021-04-22 ENCOUNTER — Other Ambulatory Visit: Payer: Self-pay

## 2021-04-22 DIAGNOSIS — C3491 Malignant neoplasm of unspecified part of right bronchus or lung: Secondary | ICD-10-CM | POA: Diagnosis not present

## 2021-04-22 DIAGNOSIS — F17210 Nicotine dependence, cigarettes, uncomplicated: Secondary | ICD-10-CM | POA: Diagnosis not present

## 2021-04-22 DIAGNOSIS — C342 Malignant neoplasm of middle lobe, bronchus or lung: Secondary | ICD-10-CM | POA: Diagnosis not present

## 2021-04-23 ENCOUNTER — Ambulatory Visit
Admission: RE | Admit: 2021-04-23 | Discharge: 2021-04-23 | Disposition: A | Discharge status: Home / Self Care | Payer: Medicare Other | Source: Ambulatory Visit | Attending: Radiation Oncology | Admitting: Radiation Oncology

## 2021-04-23 ENCOUNTER — Encounter: Payer: Self-pay | Admitting: Urology

## 2021-04-23 ENCOUNTER — Other Ambulatory Visit: Payer: Self-pay

## 2021-04-23 ENCOUNTER — Ambulatory Visit: Payer: Medicare Other

## 2021-04-23 DIAGNOSIS — C3491 Malignant neoplasm of unspecified part of right bronchus or lung: Secondary | ICD-10-CM | POA: Diagnosis not present

## 2021-04-23 DIAGNOSIS — C342 Malignant neoplasm of middle lobe, bronchus or lung: Secondary | ICD-10-CM | POA: Diagnosis not present

## 2021-04-23 DIAGNOSIS — F17210 Nicotine dependence, cigarettes, uncomplicated: Secondary | ICD-10-CM | POA: Diagnosis not present

## 2021-04-24 ENCOUNTER — Ambulatory Visit: Payer: Medicare Other

## 2021-04-27 ENCOUNTER — Ambulatory Visit: Payer: Medicare Other

## 2021-04-28 ENCOUNTER — Ambulatory Visit: Payer: Medicare Other

## 2021-05-08 ENCOUNTER — Encounter: Payer: Self-pay | Admitting: General Practice

## 2021-05-08 ENCOUNTER — Inpatient Hospital Stay: Payer: Medicare Other | Admitting: Dietician

## 2021-05-08 ENCOUNTER — Telehealth: Payer: Self-pay | Admitting: Dietician

## 2021-05-08 NOTE — Telephone Encounter (Signed)
Nutrition Follow-up:  Patient receiving concurrent chemoradiation for stage IIIb NSCLC.  Spoke with son via telephone. He reports patient is doing okay but not great. Son reports patient insisted on moving back into her condo where she has been staying the past couple of weeks. Son reports her appetite has "dropped off" but does not know what he is supposed to do now that she is not staying with him. Today she had a peanut butter sandwich and bowl of fruit for breakfast. Patient appreciative of handouts and coupons received in the mail, he is requesting additional coupons today. Son reports a close family friend checks in on her daily through the week. Son is calling her multiple times daily and goes to her house on the weekends. Patient is concerned about patient well being and wanting to know if patient would be eligible for home health care to assist patient with daily insulin regimen. Patient says he has reached out to a Education officer, museum Financial planner) from an outside agency per recommendation of a friend. Patient reports he has contacted her twice in the last 3 weeks, he has not received return call and is feeling frustrated.   Medications: reviewed  Labs: 8/8 - Glucose 287, Cr 1.13, AST 13  Anthropometrics: Weight 111 lb 12.4 oz on 8/8 decreased from 113 lb 1.6 oz on 7/25  7/8 - 113 lb 12.8 oz 7/11 - 114 lb 4.8 oz 7/5 - 115 lb   NUTRITION DIAGNOSIS: Unintended weight loss ongoing   INTERVENTION:  Continue drinking 3 Ensure Plus daily, will mail additional coupons Continue encouraging high calorie, high protein foods Will refer patient to [facility] social work team, patient appreciative Son has contact information   MONITORING, EVALUATION, GOAL: weight trends, intake   NEXT VISIT: via telephone ~4 weeks

## 2021-05-08 NOTE — Progress Notes (Signed)
[facility] CSW Progress Notes  Called son at request of Dawn [name], dietitian.  Son has concerns about his mother's ability to manage her diabetes medication at home.  Son described concerns about mother's short term memory and her ability to handle her needs to give herself insulin shots. She is moving back into her own apartment and will be living on her own.  He does pay someone to check on mother daily and son makes frequent visits to check on mother and provide needed supplies.  Advised him to contact PCP who is managing diabetes care and would also be able to address memory concerns.  Also referred to [facility] for general information on managing memory issues.  Edwyna Shell, LCSW Clinical Social Worker Phone:  [phone]

## 2021-05-09 DIAGNOSIS — J449 Chronic obstructive pulmonary disease, unspecified: Secondary | ICD-10-CM | POA: Diagnosis not present

## 2021-05-09 DIAGNOSIS — M6281 Muscle weakness (generalized): Secondary | ICD-10-CM | POA: Diagnosis not present

## 2021-05-09 DIAGNOSIS — U071 COVID-19: Secondary | ICD-10-CM | POA: Diagnosis not present

## 2021-05-09 DIAGNOSIS — D381 Neoplasm of uncertain behavior of trachea, bronchus and lung: Secondary | ICD-10-CM | POA: Diagnosis not present

## 2021-05-17 ENCOUNTER — Other Ambulatory Visit: Payer: Self-pay | Admitting: Adult Health

## 2021-05-20 DIAGNOSIS — C3490 Malignant neoplasm of unspecified part of unspecified bronchus or lung: Secondary | ICD-10-CM | POA: Diagnosis not present

## 2021-05-20 DIAGNOSIS — E1165 Type 2 diabetes mellitus with hyperglycemia: Secondary | ICD-10-CM | POA: Diagnosis not present

## 2021-05-20 DIAGNOSIS — I499 Cardiac arrhythmia, unspecified: Secondary | ICD-10-CM | POA: Diagnosis not present

## 2021-05-20 DIAGNOSIS — E46 Unspecified protein-calorie malnutrition: Secondary | ICD-10-CM | POA: Diagnosis not present

## 2021-05-20 DIAGNOSIS — D649 Anemia, unspecified: Secondary | ICD-10-CM | POA: Diagnosis not present

## 2021-05-20 DIAGNOSIS — R109 Unspecified abdominal pain: Secondary | ICD-10-CM | POA: Diagnosis not present

## 2021-06-02 NOTE — Assessment & Plan Note (Signed)
Left breast lumpectomy 09/17/2015: Invasive ductal carcinoma with calcifications, grade 1, 2.5 cm, low-grade DCIS, LVI present, 0/1 lymph nodes negative, T2 N0 stage II a, ER 100%, PR 20%, HER-2 negative, Ki-67 20% Oncotype DX score 9, 7% risk of recurrence Adjuvant radiation therapy from 11/18/2015 to 12/16/2015  Current treatment: Adjuvant antiestrogen therapy with anastrozole 1 mg daily 5 years started 12/19/2015  Anastrozole toxicities: Denies any hot flashes or myalgias. Tolerating it extremely well.  Tobacco abuse:Once again discussed smoking cessation but she is not interested in this.She did however decrease the number of cigarettes she smokes.  Breast Cancer Surveillance: 1. Breast exam9/21/2022: Benign 2. [facility]:   Stage IIIb lung cancer: Under the care of Dr. Julien Nordmann   Return to clinic in1 yrfor follow-up

## 2021-06-02 NOTE — Progress Notes (Signed)
Patient Care Team: Lawerance Cruel, MD as PCP - General (Family Medicine) Berniece Salines, DO as PCP - Cardiology (Cardiology) Excell Seltzer, MD (Inactive) as Consulting Physician (General Surgery) Nicholas Lose, MD as Consulting Physician (Hematology and Oncology) Arloa Koh, MD (Inactive) as Consulting Physician (Radiation Oncology) Sylvan Cheese, NP as Nurse Practitioner (Hematology and Oncology) Eppie Gibson, MD as Attending Physician (Radiation Oncology)  DIAGNOSIS:    ICD-10-CM   1. Malignant neoplasm of upper-outer quadrant of left breast in female, estrogen receptor positive ([place])  C50.412 MM DIAG BREAST TOMO BILATERAL   Z17.0       SUMMARY OF ONCOLOGIC HISTORY: Oncology History  Breast cancer of upper-outer quadrant of left female breast ([place])  08/14/2015 Mammogram   left breast focal asymmetry, ultrasound revealed 2.5 cm lesion. Because of breast pain, patient refuses diagnostic mammograms.   08/14/2015 Initial Diagnosis   left breast biopsy: Invasive ductal carcinoma with DCIS grade 1-2, ER 100%, PR 20%, Ki-67 20%, HER-2 negative ratio 1.77   08/14/2015 Clinical Stage   Stage IIA: T2 N0   09/17/2015 Surgery   left breast lumpectomy: Invasive ductal carcinoma with calcifications, grade 1, 2.5 cm, low-grade DCIS, LVI present, 0/1 lymph nodes negative, ER 100%, PR 20%, HER-2 negative, Ki-67 20%   09/17/2015 Pathologic Stage   Stage IIA: T2 N0   11/18/2015 - 12/16/2015 Radiation Therapy   Adjuvant radiation therapy: Left breast treated to 40.05 Gy in 15 fractions, Left breast boost treated to 10 Gy in 5 fractions   12/19/2015 -  Anti-estrogen oral therapy   Anastrozole 1 mg daily 5 years   02/12/2016 Survivorship   Survivorship visit completed and copy given to patient   Stage III squamous cell carcinoma of lung, right ([place])  02/19/2021 Initial Diagnosis   Stage III squamous cell carcinoma of left lung ([place])   03/02/2021 -  Chemotherapy    Patient is on  Treatment Plan: LUNG CARBOPLATIN / PACLITAXEL + XRT Q7D         CHIEF COMPLIANT:  Follow-up of left breast cancer on anastrozole therapy  INTERVAL HISTORY: Dawn [name] is a 80 y.o. with above-mentioned history of left breast cancer treated with lumpectomy, radiation, and who is currently on anastrozole. She presents to the clinic today for annual follow-up.  She has gotten extremely weak from recent chemotherapy and radiation.  Her son is a primary caregiver who came along with her.  He has been trying to get her to stay more active but she has mostly been sleeping at home up to 16 hours a day.  She also does not have an appetite and therefore she has not been eating very well.  ALLERGIES:  is allergic to ferumoxytol, pneumococcal vaccines, and repaglinide.  MEDICATIONS:  Current Outpatient Medications  Medication Sig Dispense Refill   albuterol (VENTOLIN HFA) 108 (90 Base) MCG/ACT inhaler Inhale 2 puffs into the lungs every 2 (two) hours as needed for wheezing or shortness of breath. Please instruct in usage 1 each 2   aspirin EC 81 MG tablet Take 81 mg by mouth daily.     Cholecalciferol (VITAMIN D3) 125 MCG (5000 UT) CAPS 1 tablet     ferrous sulfate 325 (65 FE) MG tablet Take 325 mg by mouth 2 (two) times daily.     Lancets (ONETOUCH ULTRASOFT) lancets USE TO TEST BLOOD GLUCOSE ONCE A DAY     LANTUS 100 UNIT/ML injection Inject 5 Units into the skin at bedtime.     memantine (NAMENDA)  5 MG tablet Take by mouth.     metFORMIN (GLUCOPHAGE-XR) 500 MG 24 hr tablet 1 tablet with evening meal     nitroGLYCERIN (NITROSTAT) 0.4 MG SL tablet Place 0.4 mg under the tongue every 15 (fifteen) minutes as needed for chest pain.     pantoprazole (PROTONIX) 40 MG tablet Take 1 tablet (40 mg total) by mouth daily before breakfast. 30 tablet 0   pentoxifylline (TRENTAL) 400 MG CR tablet 1 tablet with meals     pioglitazone (ACTOS) 15 MG tablet Take 1 tablet (15 mg total) by mouth daily. 30 tablet 1    prochlorperazine (COMPAZINE) 10 MG tablet Take 1 tablet (10 mg total) by mouth every 6 (six) hours as needed. 30 tablet 2   rosuvastatin (CRESTOR) 40 MG tablet Take 1 tablet (40 mg total) by mouth daily. 30 tablet 0   Tiotropium Bromide-Olodaterol (STIOLTO RESPIMAT) 2.5-2.5 MCG/ACT AERS Inhale 2 puffs into the lungs daily. 4 g 0   traMADol (ULTRAM) 50 MG tablet 1 tablet as needed     TRUEPLUS INSULIN SYRINGE 31G X 5/16" 1 ML MISC INJECT AS DIRECTED ONCE A DAY     No current facility-administered medications for this visit.    PHYSICAL EXAMINATION: ECOG PERFORMANCE STATUS: 1 - Symptomatic but completely ambulatory  Vitals:   06/03/21 1020  BP: (!) 90/50  Pulse: 65  Resp: 18  Temp: (!) 97.2 F (36.2 C)  SpO2: 98%   Filed Weights   06/03/21 1020  Weight: 97 lb 8 oz (44.2 kg)      LABORATORY DATA:  I have reviewed the data as listed CMP Latest Ref Rng & Units 04/20/2021 04/13/2021 04/06/2021  Glucose 70 - 99 mg/dL 287(H) 251(H) 230(H)  BUN 8 - 23 mg/dL '13 13 13  ' Creatinine 0.44 - 1.00 mg/dL 1.13(H) 1.06(H) 1.00  Sodium 135 - 145 mmol/L 135 136 133(L)  Potassium 3.5 - 5.1 mmol/L 4.0 3.7 3.9  Chloride 98 - 111 mmol/L 97(L) 100 99  CO2 22 - 32 mmol/L '27 25 26  ' Calcium 8.9 - 10.3 mg/dL 9.7 9.7 9.1  Total Protein 6.5 - 8.1 g/dL 6.5 6.5 5.8(L)  Total Bilirubin 0.3 - 1.2 mg/dL 0.4 0.4 0.6  Alkaline Phos 38 - 126 U/L 106 99 75  AST 15 - 41 U/L 13(L) 13(L) 12(L)  ALT 0 - 44 U/L 8 6 <6    Lab Results  Component Value Date   WBC 4.8 04/20/2021   HGB 10.1 (L) 04/20/2021   HCT 31.1 (L) 04/20/2021   MCV 89.6 04/20/2021   PLT 341 04/20/2021   NEUTROABS 4.1 04/20/2021    ASSESSMENT & PLAN:  Breast cancer of upper-outer quadrant of left female breast ([place]) Left breast lumpectomy 09/17/2015: Invasive ductal carcinoma with calcifications, grade 1, 2.5 cm, low-grade DCIS, LVI present, 0/1 lymph nodes negative, T2 N0 stage II a, ER 100%, PR 20%, HER-2 negative, Ki-67 20% Oncotype DX score  9, 7% risk of recurrence Adjuvant radiation therapy from 11/18/2015 to 12/16/2015   Current treatment: Adjuvant antiestrogen therapy with anastrozole 1 mg daily 5 years started 12/19/2015   Anastrozole toxicities: Denies any hot flashes or myalgias. Tolerating it extremely well.  I recommended discontinuation of anastrozole therapy.   Breast Cancer Surveillance: Mammogram  [facility]: Will be ordered again to be done.  She has not had a mammogram in a long time.  Stage IIIb lung cancer: Under the care of Dr. Julien Nordmann Fatigue/loss of appetite and weight: Patient has lost 17 pounds in  the last 2 months.  I encouraged her to speak more and stay more active and is slowly increase her exercises to improve her strength and wellbeing.  She is very reluctant to do any activities.   Return to clinic on an as-needed basis.    Orders Placed This Encounter  Procedures   MM DIAG BREAST TOMO BILATERAL    Standing Status:   Future    Standing Expiration Date:   06/03/2022    Order Specific Question:   Reason for Exam (SYMPTOM  OR DIAGNOSIS REQUIRED)    Answer:   Annual mammogram with H/O breast cancer    Order Specific Question:   Preferred imaging location?    Answer:   External    Comments:   [facility]    Order Specific Question:   Release to patient    Answer:   Immediate    The patient has a good understanding of the overall plan. she agrees with it. she will call with any problems that may develop before the next visit here.  Total time spent: 20 mins including face to face time and time spent for planning, charting and coordination of care  Rulon Eisenmenger, MD, MPH 06/03/2021  I, Thana Ates, am acting as scribe for Dr. Nicholas Lose.  I have reviewed the above documentation for accuracy and completeness, and I agree with the above.

## 2021-06-03 ENCOUNTER — Inpatient Hospital Stay: Payer: Medicare Other | Attending: Internal Medicine | Admitting: Hematology and Oncology

## 2021-06-03 ENCOUNTER — Other Ambulatory Visit: Payer: Self-pay

## 2021-06-03 DIAGNOSIS — N6489 Other specified disorders of breast: Secondary | ICD-10-CM | POA: Insufficient documentation

## 2021-06-03 DIAGNOSIS — R5383 Other fatigue: Secondary | ICD-10-CM | POA: Diagnosis not present

## 2021-06-03 DIAGNOSIS — R630 Anorexia: Secondary | ICD-10-CM | POA: Diagnosis not present

## 2021-06-03 DIAGNOSIS — N644 Mastodynia: Secondary | ICD-10-CM | POA: Diagnosis not present

## 2021-06-03 DIAGNOSIS — Z79899 Other long term (current) drug therapy: Secondary | ICD-10-CM | POA: Insufficient documentation

## 2021-06-03 DIAGNOSIS — C342 Malignant neoplasm of middle lobe, bronchus or lung: Secondary | ICD-10-CM | POA: Insufficient documentation

## 2021-06-03 DIAGNOSIS — R531 Weakness: Secondary | ICD-10-CM | POA: Insufficient documentation

## 2021-06-03 DIAGNOSIS — C50412 Malignant neoplasm of upper-outer quadrant of left female breast: Secondary | ICD-10-CM | POA: Insufficient documentation

## 2021-06-03 DIAGNOSIS — Z79811 Long term (current) use of aromatase inhibitors: Secondary | ICD-10-CM | POA: Insufficient documentation

## 2021-06-03 DIAGNOSIS — Z170 Estrogen receptor positive status [ER+]: Secondary | ICD-10-CM | POA: Insufficient documentation

## 2021-06-08 ENCOUNTER — Encounter: Payer: Self-pay | Admitting: Urology

## 2021-06-08 NOTE — Progress Notes (Signed)
Patient's son [name]) reports extreme fatigue, a poor appetite, weight loss of roughly 18 lbs, and chest wall pain 6/10. Patient was given Tramadol 4 months ago  by her PCP Dr. Harrington Challenger at [facility] and still has plenty left. She is taken the Tramadol PRN.  Meaningful use complete.  Patient's son notified of 11:00am-06/10/21 telephone appointment and understands.

## 2021-06-09 DIAGNOSIS — U071 COVID-19: Secondary | ICD-10-CM | POA: Diagnosis not present

## 2021-06-09 DIAGNOSIS — J449 Chronic obstructive pulmonary disease, unspecified: Secondary | ICD-10-CM | POA: Diagnosis not present

## 2021-06-09 DIAGNOSIS — M6281 Muscle weakness (generalized): Secondary | ICD-10-CM | POA: Diagnosis not present

## 2021-06-09 DIAGNOSIS — D381 Neoplasm of uncertain behavior of trachea, bronchus and lung: Secondary | ICD-10-CM | POA: Diagnosis not present

## 2021-06-10 ENCOUNTER — Ambulatory Visit
Admission: RE | Admit: 2021-06-10 | Discharge: 2021-06-10 | Disposition: A | Discharge status: Home / Self Care | Payer: Medicare Other | Source: Ambulatory Visit | Attending: Urology | Admitting: Urology

## 2021-06-10 DIAGNOSIS — C3491 Malignant neoplasm of unspecified part of right bronchus or lung: Secondary | ICD-10-CM

## 2021-06-10 NOTE — Progress Notes (Addendum)
  Radiation Oncology         [phone]) [phone] ________________________________  Name: Dawn [name] MRN: [id number]  Date: 04/23/2021  DOB: 05-02-1941   End of Treatment Note  Diagnosis:   80 year old female with T3 N2 M0, Stage IIIb non-small cell lung cancer, squamous cell carcinoma of the right middle lobe lung with involvement of a solitary subcarinal lymph node.     Indication for treatment:  Curative, Chemo-Radiotherapy       Radiation treatment dates:   02/13/21 - 04/23/21  Site/dose:   The primary tumor and involved mediastinal adenopathy were treated to 66 Gy in 30 fractions of 2.2 Gy.  Beams/energy:   A five field 3D conformal treatment arrangement was used delivering 6 and 10 MV photons.  Daily image-guidance CT was used to align the treatment with the targeted volume  Narrative: The patient tolerated radiation treatment relatively well.  She experienced some esophagitis characterized as mild.  The patient also noted fatigue.  Plan: The patient has completed radiation treatment. The patient will return to radiation oncology clinic for routine followup in one month. I advised her to call or return sooner if she has any questions or concerns related to her recovery or treatment.  ________________________________  Sheral Apley. Tammi Klippel, M.D.

## 2021-06-10 NOTE — Progress Notes (Signed)
Radiation Oncology         [phone] ________________________________  Name: Dawn [name] MRN: [id number]  Date: 06/10/2021  DOB: 04-30-1941  Post Treatment Note  CC: Lawerance Cruel, MD  Garner Nash, DO  Diagnosis:   80 year old female with T3 N2 M0, Stage IIIb non-small cell lung cancer, squamous cell carcinoma of the right middle lobe lung with involvement of a solitary subcarinal lymph node.  Interval Since Last Radiation:  7 weeks, concurrent with chemotherapy 02/13/21 - 04/23/21:  The primary tumor and involved mediastinal adenopathy were treated to 66 Gy in 33 fractions of 2 Gy.  Narrative:  I spoke with the patient and her son, Dawn [name], to conduct her routine scheduled 1 month follow up visit via telephone to spare the patient unnecessary potential exposure in the healthcare setting during the current COVID-19 pandemic.  The patient was notified in advance and gave permission to proceed with this visit format. She tolerated radiation treatment relatively well.  She experienced some esophagitis characterized as mild and also noted fatigue.                              On review of systems, the patient states that she is doing well in general.  She continues with moderate fatigue/malaise and some decreased appetite but denies any dysphagia, hemoptysis, increased shortness of breath, chest pain, fever, chills or night sweats.  Overall, she is pleased with her progress to date.  She completed 6 cycles of systemic chemotherapy with carboplatin and paclitaxel with her last cycle on 04/06/2021.  ALLERGIES:  is allergic to ferumoxytol, pneumococcal vaccines, and repaglinide.  Meds: Current Outpatient Medications  Medication Sig Dispense Refill   albuterol (VENTOLIN HFA) 108 (90 Base) MCG/ACT inhaler Inhale 2 puffs into the lungs every 2 (two) hours as needed for wheezing or shortness of breath. Please instruct in usage 1 each 2   aspirin EC 81 MG tablet Take 81 mg by mouth daily.      Cholecalciferol (VITAMIN D3) 125 MCG (5000 UT) CAPS 1 tablet     ferrous sulfate 325 (65 FE) MG tablet Take 325 mg by mouth 2 (two) times daily.     Lancets (ONETOUCH ULTRASOFT) lancets USE TO TEST BLOOD GLUCOSE ONCE A DAY     LANTUS 100 UNIT/ML injection Inject 5 Units into the skin at bedtime.     memantine (NAMENDA) 5 MG tablet Take by mouth.     metFORMIN (GLUCOPHAGE-XR) 500 MG 24 hr tablet 1 tablet with evening meal     nitroGLYCERIN (NITROSTAT) 0.4 MG SL tablet Place 0.4 mg under the tongue every 15 (fifteen) minutes as needed for chest pain.     pantoprazole (PROTONIX) 40 MG tablet Take 1 tablet (40 mg total) by mouth daily before breakfast. 30 tablet 0   pentoxifylline (TRENTAL) 400 MG CR tablet 1 tablet with meals     pioglitazone (ACTOS) 15 MG tablet Take 1 tablet (15 mg total) by mouth daily. 30 tablet 1   prochlorperazine (COMPAZINE) 10 MG tablet Take 1 tablet (10 mg total) by mouth every 6 (six) hours as needed. 30 tablet 2   rosuvastatin (CRESTOR) 40 MG tablet Take 1 tablet (40 mg total) by mouth daily. 30 tablet 0   Tiotropium Bromide-Olodaterol (STIOLTO RESPIMAT) 2.5-2.5 MCG/ACT AERS Inhale 2 puffs into the lungs daily. 4 g 0   traMADol (ULTRAM) 50 MG tablet 1 tablet as needed  TRUEPLUS INSULIN SYRINGE 31G X 5/16" 1 ML MISC INJECT AS DIRECTED ONCE A DAY     No current facility-administered medications for this encounter.    Physical Findings:  vitals were not taken for this visit.  Pain Assessment Pain Score: 0-No pain/10 Unable to assess due to telephone follow-up visit format.  Lab Findings: Lab Results  Component Value Date   WBC 4.8 04/20/2021   HGB 10.1 (L) 04/20/2021   HCT 31.1 (L) 04/20/2021   MCV 89.6 04/20/2021   PLT 341 04/20/2021     Radiographic Findings: No results found.  Impression/Plan: 60. 80 year old female with T3 N2 M0, Stage IIIb non-small cell lung cancer, squamous cell carcinoma of the right middle lobe lung with involvement of a  solitary subcarinal lymph node. She appears to have recovered well from the effects of her recent chemoradiation and is currently without complaints.  She does not have a scheduled follow-up visit with Dr. Earlie Server at this time so I will pass this information along to his team to ensure that this is scheduled appropriately.  She understands that he will determine the timing regarding posttreatment imaging going forward and that she will likely have a scan in the next couple of weeks, prior to a follow-up visit with him.  We discussed that while we are happy to continue to participate in her care if clinically indicated, at this point, we will plan to see her back on an as-needed basis.  She and her son, Dawn [name], know that they are welcome to call at anytime with any questions or concerns related to her previous radiation.     Nicholos Johns, PA-C

## 2021-06-11 ENCOUNTER — Other Ambulatory Visit: Payer: Self-pay | Admitting: Physician Assistant

## 2021-06-11 DIAGNOSIS — C3491 Malignant neoplasm of unspecified part of right bronchus or lung: Secondary | ICD-10-CM

## 2021-06-11 NOTE — Progress Notes (Signed)
[facility] OFFICE PROGRESS NOTE  Lawerance Cruel, MD [place] [address]  DIAGNOSIS: Stage IIIb (T3, N2, MX) non-small cell lung cancer,  squamous cell carcinoma. She presented with large right middle lobe lung mass in addition to subcarinal lymphadenopathy.  She was diagnosed in May 2022   PDL1: 95%  PRIOR THERAPY: Concurrent chemoradiation with carboplatin for an AUC of 2 and paclitaxel 45 mg per metered squared. Last dose 04/20/21. Status post 7 cycles.   CURRENT THERAPY: Consolidation immunotherapy with Imfinzi 1500 mg IV every 4 weeks.  First dose expected on 06/24/2021  INTERVAL HISTORY: Dawn [name] 80 y.o. female returns to the clinic today for a follow-up visit accompanied by her son, [name]. The patient was last seen in clinic on 04/06/2021.  The patient completed her last cycle of weekly chemotherapy on 04/20/2021.  For unclear reasons, the patient did not had a provider follow-up that day to arrange for a future follow-up visits to assess treatment response after completing her course of concurrent chemoradiation.  Since her last appointment, the patient reports fatigue and generalized weakness.  She has lost a substantial amount of weight the interval since her last appointment.  She lost 17 pounds when she was seen on 06/03/21. She has plenty of pre-prepared food at home. Her son calls her daily to remind her to eat and neighbors bring food over as well. Despite this, she does not eat a lot at home. She started gaining some weight back.  She has supplemental drinks but does not always drink them. She denies any recent fever, chills, or night sweats.  Her breathing is unchanged she has a dyspnea on exertion.  She denies any cough or hemoptysis. She sometimes has sternal pain for which she is prescribed tramadol. Her pain is achy. She had a pain 6/10 yesterday but it resolved with tramadol.  Denies any nausea, vomiting, diarrhea, or constipation.  Denies any  headache or visual changes.  The patient recently had a restaging CT scan performed.  She is here today for evaluation and to review her scan results and for more detailed discussion about the next steps in treatment.  MEDICAL HISTORY: Past Medical History:  Diagnosis Date   Anemia    Anxiety    Arthritis    Breast cancer of upper-outer quadrant of left female breast ([place]) 08/18/2015   Broken wrist    when younger x2   COPD (chronic obstructive pulmonary disease) ([place])    Hospitalization March 2011   COVID-19 01/22/2021   Dementia ([place])    Depression    Diabetes mellitus without complication ([place])    type 2   Dyspnea    Full dentures    H/O vaginal delivery 1962   x1    High cholesterol    History of home oxygen therapy    Hypertension    Memory changes    sharp decline over the last 3 months, per son as of 01/22/21,    Pneumonia 01/22/2021   Radiation    11/18/15- 12/16/15 to her Left Breast    ALLERGIES:  is allergic to ferumoxytol, pneumococcal vaccines, and repaglinide.  MEDICATIONS:  Current Outpatient Medications  Medication Sig Dispense Refill   acetaminophen-codeine (TYLENOL #3) 300-30 MG tablet Take 1 tablet by mouth every 6 (six) hours as needed.     albuterol (VENTOLIN HFA) 108 (90 Base) MCG/ACT inhaler Inhale 2 puffs into the lungs every 2 (two) hours as needed for wheezing or shortness  of breath. Please instruct in usage 1 each 2   aspirin EC 81 MG tablet Take 81 mg by mouth daily.     Cholecalciferol (VITAMIN D3) 125 MCG (5000 UT) CAPS 1 tablet     ferrous sulfate 325 (65 FE) MG tablet Take 325 mg by mouth 2 (two) times daily.     glipiZIDE (GLUCOTROL) 5 MG tablet Take 5 mg by mouth every morning.     Lancets (ONETOUCH ULTRASOFT) lancets USE TO TEST BLOOD GLUCOSE ONCE A DAY     LANTUS 100 UNIT/ML injection Inject 5 Units into the skin at bedtime.     memantine (NAMENDA) 5 MG tablet Take by mouth.     metFORMIN (GLUCOPHAGE-XR) 500 MG 24 hr tablet 1 tablet with  evening meal     nitroGLYCERIN (NITROSTAT) 0.4 MG SL tablet Place 0.4 mg under the tongue every 15 (fifteen) minutes as needed for chest pain.     pentoxifylline (TRENTAL) 400 MG CR tablet 1 tablet with meals     pioglitazone (ACTOS) 15 MG tablet Take 1 tablet (15 mg total) by mouth daily. 30 tablet 1   prochlorperazine (COMPAZINE) 10 MG tablet Take 1 tablet (10 mg total) by mouth every 6 (six) hours as needed. 30 tablet 2   rosuvastatin (CRESTOR) 40 MG tablet Take 1 tablet (40 mg total) by mouth daily. 30 tablet 0   Tiotropium Bromide-Olodaterol (STIOLTO RESPIMAT) 2.5-2.5 MCG/ACT AERS Inhale 2 puffs into the lungs daily. 4 g 0   traMADol (ULTRAM) 50 MG tablet 1 tablet as needed     TRUEPLUS INSULIN SYRINGE 31G X 5/16" 1 ML MISC INJECT AS DIRECTED ONCE A DAY     pantoprazole (PROTONIX) 40 MG tablet Take 1 tablet (40 mg total) by mouth daily before breakfast. 30 tablet 0   No current facility-administered medications for this visit.    SURGICAL HISTORY:  Past Surgical History:  Procedure Laterality Date   ABDOMINAL HYSTERECTOMY     APPENDECTOMY     BRONCHIAL BIOPSY  02/16/2021   Procedure: BRONCHIAL BIOPSIES;  Surgeon: Collene Gobble, MD;  Location: [facility];  Service: Pulmonary;;   BRONCHIAL BRUSHINGS  02/16/2021   Procedure: BRONCHIAL BRUSHINGS;  Surgeon: Collene Gobble, MD;  Location: [facility];  Service: Pulmonary;;   BRONCHIAL NEEDLE ASPIRATION BIOPSY  02/16/2021   Procedure: BRONCHIAL NEEDLE ASPIRATION BIOPSIES;  Surgeon: Collene Gobble, MD;  Location: [facility];  Service: Pulmonary;;   CHOLECYSTECTOMY     EYE SURGERY     cataracts   RADIOACTIVE SEED GUIDED PARTIAL MASTECTOMY WITH AXILLARY SENTINEL LYMPH NODE BIOPSY Left 09/17/2015   Procedure: RADIOACTIVE SEED LOCALIZATION LEFT BREAST LUMPECTOMY AND LEFT AXILLARY SENTINEL LYMPH NODE BIOPSY;  Surgeon: Excell Seltzer, MD;  Location: [facility];  Service: General;  Laterality: Left;   TONSILLECTOMY     VIDEO BRONCHOSCOPY WITH  ENDOBRONCHIAL NAVIGATION Right 02/16/2021   Procedure: VIDEO BRONCHOSCOPY WITH ENDOBRONCHIAL NAVIGATION;  Surgeon: Collene Gobble, MD;  Location: [facility];  Service: Pulmonary;  Laterality: Right;    REVIEW OF SYSTEMS:   Review of Systems  Constitutional: Positive for decreased appetite and weight loss. Negative for chills and fever HENT: Negative for mouth sores, nosebleeds, sore throat and trouble swallowing.   Eyes: Negative for eye problems and icterus.  Respiratory: Positive for dyspnea on exertion.  Negative for cough, hemoptysis, and wheezing.   Cardiovascular: Positive for intermittent sternal chest pain.  Negative for leg swelling.  Gastrointestinal: Negative for abdominal pain, constipation, diarrhea, nausea and vomiting.  Genitourinary: Negative for bladder incontinence, difficulty urinating, dysuria, frequency and hematuria.   Musculoskeletal: Negative for back pain, gait problem, neck pain and neck stiffness.  Skin: Negative for itching and rash.  Neurological: Negative for dizziness, extremity weakness, gait problem, headaches, light-headedness and seizures.  Hematological: Negative for adenopathy. Does not bruise/bleed easily.  Psychiatric/Behavioral: Negative for confusion, depression and sleep disturbance. The patient is not nervous/anxious.     PHYSICAL EXAMINATION:  Blood pressure 139/83, pulse 92, temperature 97.9 F (36.6 C), temperature source Oral, resp. rate 16, height 5' 7" (1.702 m), weight 105 lb 8 oz (47.9 kg), SpO2 92 %.  ECOG PERFORMANCE STATUS: 1-2  Physical Exam  Constitutional: Oriented to person, place, and time and cachectic appearing female and in no distress.  HENT:  Head: Normocephalic and atraumatic.  Mouth/Throat: Oropharynx is clear and moist. No oropharyngeal exudate.  Eyes: Conjunctivae are normal. Right eye exhibits no discharge. Left eye exhibits no discharge. No scleral icterus.  Neck: Normal range of motion. Neck supple.   Cardiovascular: Normal rate, regular rhythm, normal heart sounds and intact distal pulses.   Pulmonary/Chest: Effort normal and breath sounds normal. No respiratory distress. No wheezes. No rales.  Abdominal: Soft. Bowel sounds are normal. Exhibits no distension and no mass. There is no tenderness.  Musculoskeletal: Normal range of motion. Exhibits no edema.  Lymphadenopathy:    No cervical adenopathy.  Neurological: Alert and oriented to person, place, and time. Exhibits muscle wasting.  Patient is ambulates with a walker.   Skin: Skin is warm and dry. No rash noted. Not diaphoretic. No erythema. No pallor.  Psychiatric: Mood, memory and judgment normal.  Vitals reviewed.  LABORATORY DATA: Lab Results  Component Value Date   WBC 4.0 06/15/2021   HGB 10.4 (L) 06/15/2021   HCT 32.8 (L) 06/15/2021   MCV 98.5 06/15/2021   PLT 201 06/15/2021      Chemistry      Component Value Date/Time   NA 141 06/15/2021 0939   NA 137 08/20/2015 1232   K 3.6 06/15/2021 0939   K 4.3 08/20/2015 1232   CL 101 06/15/2021 0939   CO2 28 06/15/2021 0939   CO2 25 08/20/2015 1232   BUN 19 06/15/2021 0939   BUN 10.4 08/20/2015 1232   CREATININE 1.04 (H) 06/15/2021 0939   CREATININE 1.1 08/20/2015 1232      Component Value Date/Time   CALCIUM 9.4 06/15/2021 0939   CALCIUM 10.0 08/20/2015 1232   ALKPHOS 106 04/20/2021 1049   ALKPHOS 142 08/20/2015 1232   AST 13 (L) 04/20/2021 1049   AST 18 08/20/2015 1232   ALT 8 04/20/2021 1049   ALT 11 08/20/2015 1232   BILITOT 0.4 04/20/2021 1049   BILITOT 0.32 08/20/2015 1232       RADIOGRAPHIC STUDIES:  CT Chest W Contrast  Result Date: 06/12/2021 CLINICAL DATA:  Lung cancer follow-up. EXAM: CT CHEST WITH CONTRAST TECHNIQUE: Multidetector CT imaging of the chest was performed during intravenous contrast administration. CONTRAST:  21m OMNIPAQUE IOHEXOL 350 MG/ML SOLN COMPARISON:  Chest CT 02/13/2021 and PET-CT 01/15/2021 FINDINGS: Cardiovascular: The  heart is normal in size. No pericardial effusion. Stable tortuosity and calcification of the thoracic aorta. No focal aneurysm or dissection. Stable branch vessel calcifications including three-vessel coronary artery calcifications. Dense calcifications also noted around the aortic valve and mitral valve annulus. Mediastinum/Nodes: Slight interval decrease in size of the mediastinal lymph nodes. The precarinal node measures 8 mm in previously measured 10.5 mm. The left paratracheal/AP window node  on image 58/2 measures 5 mm and previously measured 7 mm. The right-sided subcarinal node is much smaller. It previously measured a maximum of 11 mm and now measures 3.5 mm. The esophagus is grossly normal. Lungs/Pleura: Interval decrease in size of the right middle lobe lung mass. It measures approximately 4.2 x 2.6 cm on image 89/5. On the original super D chest CT from 01/02/2021 it measured 5.2 x 3.5 cm. On the chest CT from 02/13/2021 it measured 6.6 x 5.3 cm. Patchy areas of surrounding ground-glass opacity likely inflammatory changes or possibly related to radiation. The moderate-sized pleural effusion seen on the June CT scan has resolved. Stable underlying emphysematous changes and areas of pulmonary scarring. Stable radiation changes involving the anterior aspect of the left lung from treated breast cancer. No new pulmonary nodules to suggest pulmonary metastatic disease. Upper Abdomen: No significant upper abdominal findings. Stable advanced aortic calcifications. No hepatic or adrenal gland lesions are identified. Musculoskeletal: Stable surgical changes involving the left breast. No supraclavicular or axillary adenopathy. The thyroid gland is unremarkable. The bony thorax is intact. Stable compression fracture of T12 when compared to the June CT scan. This was not present on the prior PET-CT or chest CT. IMPRESSION: 1. Interval decrease in size of the right middle lobe lung mass and mediastinal lymph nodes. 2.  Stable radiation changes involving the anterior aspect of the left lung from treated breast cancer. 3. Resolution of right pleural effusion. 4. Stable emphysematous changes and pulmonary scarring. No new pulmonary nodules to suggest pulmonary metastatic disease. 5. Stable advanced atherosclerotic calcifications involving the thoracic and abdominal aorta and branch vessels including three-vessel coronary artery calcifications. 6. Stable T12 compression fracture. Aortic Atherosclerosis (ICD10-I70.0) and Emphysema (ICD10-J43.9). Electronically Signed   By: Marijo Sanes M.D.   On: 06/12/2021 13:02     ASSESSMENT/PLAN:  This is a very pleasant 80 year old Caucasian female diagnosed with stage IIIb (T3, N2, Mx) non-small cell lung cancer, squamous cell carcinoma.  She presented with a large right middle lobe lung mass in addition to subcarinal lymphadenopathy.  She was diagnosed in May 2022.  PD-L1 testing was 95%    The patient is currently undergoing concurrent chemoradiation with carboplatin for an [facility] of 2 and paclitaxel 45 mg per metered square. She is status post 7 cycles.  Her last dose of treatment was on 04/20/2021.  The patient recently had a restaging CT scan performed.  Dr. Julien Nordmann personally and independently reviewed the scan discussed results with the patient today.  The scan showed a positive to response to treatment.   Dr. Julien Nordmann recommends that the patient start consolidation immunotherapy with Imfinzi 1500 mg IV every 4 weeks.  She is interested in this option and she is expected to receive her first dose of treatment next week.  I discussed with her the adverse effect of the immunotherapy including but not limited to immunotherapy mediated skin rash, diarrhea, inflammation of the lung, kidney, liver, thyroid or other endocrine dysfunction  We will see her back for follow-up visit in 5 weeks for evaluation and repeat blood work before starting cycle #2 treatment.  We discussed appetite  stimulants with the patient and her son.  The patient was given the option of starting Megace.  I discussed the risk of blood clots with the patient and her son.  They are not interested in starting Megace due to this.  Unable to prescribe a Medrol Dosepak due to the patient's diabetes.  Unable to prescribe Remeron due to drug  to drug interactions with her tramadol which she is currently taking as needed.  We discussed possibly prescribing Marinol.  The patient and her son are going to try with lifestyle modifications first and if no improvement at her next appointment in 5 weeks,  then we will discuss possibly prescribing Marinol.  They were given coupons for supplemental drinks.  The patient was advised to call immediately if she has any concerning symptoms in the interval. The patient voices understanding of current disease status and treatment options and is in agreement with the current care plan. All questions were answered. The patient knows to call the clinic with any problems, questions or concerns. We can certainly see the patient much sooner if necessary            Orders Placed This Encounter  Procedures   CBC with Differential ([facility] Only)    Standing Status:   Standing    Number of Occurrences:   12    Standing Expiration Date:   06/15/2022   CMP ([facility] only)    Standing Status:   Standing    Number of Occurrences:   12    Standing Expiration Date:   06/15/2022   TSH    Standing Status:   Standing    Number of Occurrences:   12    Standing Expiration Date:   06/15/2022       Zamyiah Tino L Chauna Osoria, PA-C 06/15/21  ADDENDUM: Hematology/Oncology Attending: I had a face-to-face encounter with the patient today.  I reviewed her record, lab and scan and recommended her care plan.  This is a very pleasant 80 years old white female diagnosed with stage IIIb (T3, N2, M0) non-small cell lung cancer, squamous cell carcinoma presented with large right middle  lobe lung mass with subcarinal lymphadenopathy and PD-L1 expression of 95% diagnosed in May 2022.  The patient underwent a course of concurrent chemoradiation with weekly carboplatin and paclitaxel status post 7 weekly doses and tolerated her treatment fairly well.  Unfortunately she was lost to follow-up after the completion of the course of concurrent chemoradiation.  She had repeat CT scan of the chest performed recently.  I personally and independently reviewed the scans and discussed the results with the patient and her son. Her scan showed improvement of her disease with decrease in the lung mass as well as the subcarinal lymphadenopathy. I discussed with the patient her consolidation treatment option and recommended for her treatment with immunotherapy with Imfinzi 1500 Mg IV every 4 weeks for a total of 1 year as long as the patient has no evidence for disease progression or unacceptable toxicity. I discussed with the patient and her son the adverse effect of this treatment including but not limited to immunotherapy mediated skin rash, diarrhea, inflammation of the lung, kidney, liver, thyroid or other endocrine dysfunction. The patient would like to proceed with the treatment as planned and she is expected to start the first dose of this treatment next week. For the lack of appetite and weight loss, I will start the patient on Megace ES 625 mg p.o. daily. The patient will come back for follow-up visit in 5 weeks with the start of cycle #2. She was advised to call immediately if she has any other concerning symptoms in the interval.  The total time spent in the appointment was 40 minutes.  Disclaimer: This note was dictated with voice recognition software. Similar sounding words can inadvertently be transcribed and may be missed upon review. Julien Nordmann  Fanny Bien, MD 06/15/21

## 2021-06-12 ENCOUNTER — Ambulatory Visit (HOSPITAL_COMMUNITY)
Admission: RE | Admit: 2021-06-12 | Discharge: 2021-06-12 | Disposition: A | Discharge status: Home / Self Care | Payer: Medicare Other | Source: Ambulatory Visit | Attending: Physician Assistant | Admitting: Physician Assistant

## 2021-06-12 ENCOUNTER — Other Ambulatory Visit: Payer: Self-pay

## 2021-06-12 ENCOUNTER — Encounter (HOSPITAL_COMMUNITY): Payer: Self-pay

## 2021-06-12 DIAGNOSIS — I700 Atherosclerosis of aorta: Secondary | ICD-10-CM | POA: Diagnosis not present

## 2021-06-12 DIAGNOSIS — C3490 Malignant neoplasm of unspecified part of unspecified bronchus or lung: Secondary | ICD-10-CM | POA: Diagnosis not present

## 2021-06-12 DIAGNOSIS — J90 Pleural effusion, not elsewhere classified: Secondary | ICD-10-CM | POA: Diagnosis not present

## 2021-06-12 DIAGNOSIS — C3491 Malignant neoplasm of unspecified part of right bronchus or lung: Secondary | ICD-10-CM | POA: Diagnosis not present

## 2021-06-12 DIAGNOSIS — R918 Other nonspecific abnormal finding of lung field: Secondary | ICD-10-CM | POA: Diagnosis not present

## 2021-06-12 DIAGNOSIS — J439 Emphysema, unspecified: Secondary | ICD-10-CM | POA: Diagnosis not present

## 2021-06-12 LAB — POCT I-STAT CREATININE: Creatinine, Ser: 0.8 mg/dL (ref 0.44–1.00)

## 2021-06-12 MED ORDER — IOHEXOL 350 MG/ML SOLN
60.0000 mL | Freq: Once | INTRAVENOUS | Status: AC | PRN
  Administered 2021-06-12: 60 mL via INTRAVENOUS

## 2021-06-14 ENCOUNTER — Other Ambulatory Visit: Payer: Self-pay | Admitting: Adult Health

## 2021-06-15 ENCOUNTER — Inpatient Hospital Stay (HOSPITAL_BASED_OUTPATIENT_CLINIC_OR_DEPARTMENT_OTHER): Payer: Medicare Other | Admitting: Physician Assistant

## 2021-06-15 ENCOUNTER — Encounter: Payer: Self-pay | Admitting: Physician Assistant

## 2021-06-15 ENCOUNTER — Other Ambulatory Visit: Payer: Self-pay | Admitting: Internal Medicine

## 2021-06-15 ENCOUNTER — Other Ambulatory Visit: Payer: Self-pay

## 2021-06-15 ENCOUNTER — Inpatient Hospital Stay: Payer: Medicare Other | Attending: Internal Medicine

## 2021-06-15 VITALS — BP 139/83 | HR 92 | Temp 97.9°F | Resp 16 | Ht 67.0 in | Wt 105.5 lb

## 2021-06-15 DIAGNOSIS — E119 Type 2 diabetes mellitus without complications: Secondary | ICD-10-CM | POA: Insufficient documentation

## 2021-06-15 DIAGNOSIS — R634 Abnormal weight loss: Secondary | ICD-10-CM | POA: Diagnosis not present

## 2021-06-15 DIAGNOSIS — Z79899 Other long term (current) drug therapy: Secondary | ICD-10-CM | POA: Insufficient documentation

## 2021-06-15 DIAGNOSIS — M4854XA Collapsed vertebra, not elsewhere classified, thoracic region, initial encounter for fracture: Secondary | ICD-10-CM | POA: Insufficient documentation

## 2021-06-15 DIAGNOSIS — I700 Atherosclerosis of aorta: Secondary | ICD-10-CM | POA: Diagnosis not present

## 2021-06-15 DIAGNOSIS — R0609 Other forms of dyspnea: Secondary | ICD-10-CM | POA: Diagnosis not present

## 2021-06-15 DIAGNOSIS — C342 Malignant neoplasm of middle lobe, bronchus or lung: Secondary | ICD-10-CM | POA: Insufficient documentation

## 2021-06-15 DIAGNOSIS — C3490 Malignant neoplasm of unspecified part of unspecified bronchus or lung: Secondary | ICD-10-CM

## 2021-06-15 DIAGNOSIS — I10 Essential (primary) hypertension: Secondary | ICD-10-CM | POA: Insufficient documentation

## 2021-06-15 DIAGNOSIS — J449 Chronic obstructive pulmonary disease, unspecified: Secondary | ICD-10-CM | POA: Insufficient documentation

## 2021-06-15 DIAGNOSIS — R072 Precordial pain: Secondary | ICD-10-CM | POA: Diagnosis not present

## 2021-06-15 DIAGNOSIS — R630 Anorexia: Secondary | ICD-10-CM | POA: Diagnosis not present

## 2021-06-15 DIAGNOSIS — R918 Other nonspecific abnormal finding of lung field: Secondary | ICD-10-CM

## 2021-06-15 DIAGNOSIS — J439 Emphysema, unspecified: Secondary | ICD-10-CM | POA: Diagnosis not present

## 2021-06-15 LAB — CBC WITH DIFFERENTIAL (CANCER CENTER ONLY)
Abs Immature Granulocytes: 0.03 10*3/uL (ref 0.00–0.07)
Basophils Absolute: 0 10*3/uL (ref 0.0–0.1)
Basophils Relative: 0 %
Eosinophils Absolute: 0 10*3/uL (ref 0.0–0.5)
Eosinophils Relative: 1 %
HCT: 32.8 % — ABNORMAL LOW (ref 36.0–46.0)
Hemoglobin: 10.4 g/dL — ABNORMAL LOW (ref 12.0–15.0)
Immature Granulocytes: 1 %
Lymphocytes Relative: 22 %
Lymphs Abs: 0.9 10*3/uL (ref 0.7–4.0)
MCH: 31.2 pg (ref 26.0–34.0)
MCHC: 31.7 g/dL (ref 30.0–36.0)
MCV: 98.5 fL (ref 80.0–100.0)
Monocytes Absolute: 0.3 10*3/uL (ref 0.1–1.0)
Monocytes Relative: 8 %
Neutro Abs: 2.7 10*3/uL (ref 1.7–7.7)
Neutrophils Relative %: 68 %
Platelet Count: 201 10*3/uL (ref 150–400)
RBC: 3.33 MIL/uL — ABNORMAL LOW (ref 3.87–5.11)
RDW: 19.4 % — ABNORMAL HIGH (ref 11.5–15.5)
WBC Count: 4 10*3/uL (ref 4.0–10.5)
nRBC: 0 % (ref 0.0–0.2)

## 2021-06-15 LAB — BASIC METABOLIC PANEL - CANCER CENTER ONLY
Anion gap: 12 (ref 5–15)
BUN: 19 mg/dL (ref 8–23)
CO2: 28 mmol/L (ref 22–32)
Calcium: 9.4 mg/dL (ref 8.9–10.3)
Chloride: 101 mmol/L (ref 98–111)
Creatinine: 1.04 mg/dL — ABNORMAL HIGH (ref 0.44–1.00)
GFR, Estimated: 55 mL/min — ABNORMAL LOW (ref >60)
Glucose, Bld: 241 mg/dL — ABNORMAL HIGH (ref 70–99)
Potassium: 3.6 mmol/L (ref 3.5–5.1)
Sodium: 141 mmol/L (ref 135–145)

## 2021-06-15 NOTE — Progress Notes (Signed)
DISCONTINUE ON PATHWAY REGIMEN - Non-Small Cell Lung     Administer weekly:     Paclitaxel      Carboplatin   **Always confirm dose/schedule in your pharmacy ordering system**  REASON: Other Reason PRIOR TREATMENT: [id number]: Carboplatin AUC=2 + Paclitaxel 45 mg/m2 Weekly During Radiation TREATMENT RESPONSE: Partial Response (PR)  START ON PATHWAY REGIMEN - Non-Small Cell Lung     A cycle is every 14 days:     Durvalumab   **Always confirm dose/schedule in your pharmacy ordering system**  Patient Characteristics: Preoperative or Nonsurgical Candidate (Clinical Staging), Stage III - Nonsurgical Candidate (Nonsquamous and Squamous), PS = 0, 1 Therapeutic Status: Preoperative or Nonsurgical Candidate (Clinical Staging) AJCC T Category: cT4 AJCC N Category: cN0 AJCC M Category: cM0 AJCC 8 Stage Grouping: IIIA ECOG Performance Status: 1 Intent of Therapy: Curative Intent, Discussed with Patient

## 2021-06-15 NOTE — Patient Instructions (Signed)
-  We covered a lot of important information at your appointment today regarding what the treatment plan is moving forward. Here are the the main points that were discussed at your office visit with [place] today:  -The treatment will consist of a new medication. This is not chemotherapy. This new drug is a type of Immunotherapy called Imfinzi (Durvalumab).  -We are planning on starting your treatment next week on 06/22/21  -Your treatment will be given once every 4 weeks. You will receive this treatment every 4 weeks for a total of 1 year (13 total treatments) unless you experience unacceptable toxicity or if there is evidence on your routine CT scans that the cancer is growing  -We will get a CT scan after every 3 treatments to check on the progress of treatment  Side Effects:  -The adverse effect of the immunotherapy including but not limited to immunotherapy mediated skin rash, diarrhea, inflammation of the lung, kidney, liver, thyroid or other endocrine dysfunction  Follow up:  -We will see you back for a follow up visit in 5 weeks when you come in for your second treatment.

## 2021-06-17 ENCOUNTER — Telehealth: Payer: Self-pay | Admitting: Physician Assistant

## 2021-06-17 NOTE — Telephone Encounter (Signed)
Sch per 10/3 los, pt son aware

## 2021-06-21 ENCOUNTER — Encounter: Payer: Self-pay | Admitting: Physician Assistant

## 2021-06-22 NOTE — Progress Notes (Signed)
Pharmacist Chemotherapy Monitoring - Initial Assessment    Anticipated start date: 06/23/21   The following has been reviewed per standard work regarding the patient's treatment regimen: The patient's diagnosis, treatment plan and drug doses, and organ/hematologic function Lab orders and baseline tests specific to treatment regimen  The treatment plan start date, drug sequencing, and pre-medications Prior authorization status  Patient's documented medication list, including drug-drug interaction screen and prescriptions for anti-emetics and supportive care specific to the treatment regimen The drug concentrations, fluid compatibility, administration routes, and timing of the medications to be used The patient's access for treatment and lifetime cumulative dose history, if applicable  The patient's medication allergies and previous infusion related reactions, if applicable   Changes made to treatment plan:  treatment plan date  Follow up needed:  N/A    Kennith Center, Pharm.D., CPP 06/22/2021@3 :55 PM

## 2021-06-23 ENCOUNTER — Inpatient Hospital Stay: Payer: Medicare Other

## 2021-06-23 ENCOUNTER — Other Ambulatory Visit: Payer: Self-pay | Admitting: Internal Medicine

## 2021-06-23 ENCOUNTER — Other Ambulatory Visit: Payer: Self-pay

## 2021-06-23 VITALS — BP 172/81 | HR 98 | Temp 97.2°F | Resp 18 | Wt 110.2 lb

## 2021-06-23 DIAGNOSIS — C3491 Malignant neoplasm of unspecified part of right bronchus or lung: Secondary | ICD-10-CM

## 2021-06-23 DIAGNOSIS — C3490 Malignant neoplasm of unspecified part of unspecified bronchus or lung: Secondary | ICD-10-CM

## 2021-06-23 DIAGNOSIS — R072 Precordial pain: Secondary | ICD-10-CM | POA: Diagnosis not present

## 2021-06-23 DIAGNOSIS — I700 Atherosclerosis of aorta: Secondary | ICD-10-CM | POA: Diagnosis not present

## 2021-06-23 DIAGNOSIS — R634 Abnormal weight loss: Secondary | ICD-10-CM | POA: Diagnosis not present

## 2021-06-23 DIAGNOSIS — R0609 Other forms of dyspnea: Secondary | ICD-10-CM | POA: Diagnosis not present

## 2021-06-23 DIAGNOSIS — I10 Essential (primary) hypertension: Secondary | ICD-10-CM | POA: Diagnosis not present

## 2021-06-23 DIAGNOSIS — C342 Malignant neoplasm of middle lobe, bronchus or lung: Secondary | ICD-10-CM | POA: Diagnosis not present

## 2021-06-23 DIAGNOSIS — J439 Emphysema, unspecified: Secondary | ICD-10-CM | POA: Diagnosis not present

## 2021-06-23 DIAGNOSIS — E119 Type 2 diabetes mellitus without complications: Secondary | ICD-10-CM | POA: Diagnosis not present

## 2021-06-23 DIAGNOSIS — M4854XA Collapsed vertebra, not elsewhere classified, thoracic region, initial encounter for fracture: Secondary | ICD-10-CM | POA: Diagnosis not present

## 2021-06-23 DIAGNOSIS — Z79899 Other long term (current) drug therapy: Secondary | ICD-10-CM | POA: Diagnosis not present

## 2021-06-23 DIAGNOSIS — J449 Chronic obstructive pulmonary disease, unspecified: Secondary | ICD-10-CM | POA: Diagnosis not present

## 2021-06-23 LAB — CBC WITH DIFFERENTIAL (CANCER CENTER ONLY)
Abs Immature Granulocytes: 0.01 10*3/uL (ref 0.00–0.07)
Basophils Absolute: 0 10*3/uL (ref 0.0–0.1)
Basophils Relative: 0 %
Eosinophils Absolute: 0.1 10*3/uL (ref 0.0–0.5)
Eosinophils Relative: 2 %
HCT: 29.8 % — ABNORMAL LOW (ref 36.0–46.0)
Hemoglobin: 9.3 g/dL — ABNORMAL LOW (ref 12.0–15.0)
Immature Granulocytes: 0 %
Lymphocytes Relative: 26 %
Lymphs Abs: 0.9 10*3/uL (ref 0.7–4.0)
MCH: 30.8 pg (ref 26.0–34.0)
MCHC: 31.2 g/dL (ref 30.0–36.0)
MCV: 98.7 fL (ref 80.0–100.0)
Monocytes Absolute: 0.4 10*3/uL (ref 0.1–1.0)
Monocytes Relative: 12 %
Neutro Abs: 2.1 10*3/uL (ref 1.7–7.7)
Neutrophils Relative %: 60 %
Platelet Count: 199 10*3/uL (ref 150–400)
RBC: 3.02 MIL/uL — ABNORMAL LOW (ref 3.87–5.11)
RDW: 18.9 % — ABNORMAL HIGH (ref 11.5–15.5)
WBC Count: 3.5 10*3/uL — ABNORMAL LOW (ref 4.0–10.5)
nRBC: 0 % (ref 0.0–0.2)

## 2021-06-23 LAB — TSH: TSH: 1.444 u[IU]/mL (ref 0.308–3.960)

## 2021-06-23 LAB — CMP (CANCER CENTER ONLY)
ALT: <5 U/L (ref 0–44)
AST: 10 U/L — ABNORMAL LOW (ref 15–41)
Albumin: 2.8 g/dL — ABNORMAL LOW (ref 3.5–5.0)
Alkaline Phosphatase: 138 U/L — ABNORMAL HIGH (ref 38–126)
Anion gap: 10 (ref 5–15)
BUN: 20 mg/dL (ref 8–23)
CO2: 27 mmol/L (ref 22–32)
Calcium: 9.2 mg/dL (ref 8.9–10.3)
Chloride: 102 mmol/L (ref 98–111)
Creatinine: 1.18 mg/dL — ABNORMAL HIGH (ref 0.44–1.00)
GFR, Estimated: 47 mL/min — ABNORMAL LOW (ref >60)
Glucose, Bld: 272 mg/dL — ABNORMAL HIGH (ref 70–99)
Potassium: 3.9 mmol/L (ref 3.5–5.1)
Sodium: 139 mmol/L (ref 135–145)
Total Bilirubin: 0.3 mg/dL (ref 0.3–1.2)
Total Protein: 6.2 g/dL — ABNORMAL LOW (ref 6.5–8.1)

## 2021-06-23 MED ORDER — SODIUM CHLORIDE 0.9 % IV SOLN
1500.0000 mg | Freq: Once | INTRAVENOUS | Status: AC
  Administered 2021-06-23: 1500 mg via INTRAVENOUS
  Filled 2021-06-23: qty 30

## 2021-06-23 MED ORDER — SODIUM CHLORIDE 0.9 % IV SOLN: Freq: Once | INTRAVENOUS | Status: AC

## 2021-06-23 NOTE — Patient Instructions (Signed)
[facility]  Discharge Instructions: Thank you for choosing [facility] to provide your oncology and hematology care.   If you have a lab appointment with the [facility], please go directly to the [facility] and check in at the registration area.   Wear comfortable clothing and clothing appropriate for easy access to any Portacath or PICC line.   We strive to give you quality time with your provider. You may need to reschedule your appointment if you arrive late (15 or more minutes).  Arriving late affects you and other patients whose appointments are after yours.  Also, if you miss three or more appointments without notifying the office, you may be dismissed from the clinic at the provider's discretion.      For prescription refill requests, have your pharmacy contact our office and allow 72 hours for refills to be completed.    Today you received the following chemotherapy and/or immunotherapy agents: Imfinzi   To help prevent nausea and vomiting after your treatment, we encourage you to take your nausea medication as directed.  BELOW ARE SYMPTOMS THAT SHOULD BE REPORTED IMMEDIATELY: *FEVER GREATER THAN 100.4 F (38 C) OR HIGHER *CHILLS OR SWEATING *NAUSEA AND VOMITING THAT IS NOT CONTROLLED WITH YOUR NAUSEA MEDICATION *UNUSUAL SHORTNESS OF BREATH *UNUSUAL BRUISING OR BLEEDING *URINARY PROBLEMS (pain or burning when urinating, or frequent urination) *BOWEL PROBLEMS (unusual diarrhea, constipation, pain near the anus) TENDERNESS IN MOUTH AND THROAT WITH OR WITHOUT PRESENCE OF ULCERS (sore throat, sores in mouth, or a toothache) UNUSUAL RASH, SWELLING OR PAIN  UNUSUAL VAGINAL DISCHARGE OR ITCHING   Items with * indicate a potential emergency and should be followed up as soon as possible or go to the Emergency Department if any problems should occur.  Please show the CHEMOTHERAPY ALERT CARD or IMMUNOTHERAPY ALERT CARD at check-in to the  Emergency Department and triage nurse.  Should you have questions after your visit or need to cancel or reschedule your appointment, please contact [facility]  Dept: [phone]  and follow the prompts.  Office hours are 8:00 a.m. to 4:30 p.m. Monday - Friday. Please note that voicemails left after 4:00 p.m. may not be returned until the following business day.  We are closed weekends and major holidays. You have access to a nurse at all times for urgent questions. Please call the main number to the clinic Dept: [phone] and follow the prompts.   For any non-urgent questions, you may also contact your provider using MyChart. We now offer e-Visits for anyone 66 and older to request care online for non-urgent symptoms. For details visit [email or web address].   Also download the MyChart app! Go to the app store, search "MyChart", open the app, select [facility], and log in with your MyChart username and password.  Due to Covid, a mask is required upon entering the hospital/clinic. If you do not have a mask, one will be given to you upon arrival. For doctor visits, patients may have 1 support person aged 70 or older with them. For treatment visits, patients cannot have anyone with them due to current Covid guidelines and our immunocompromised population.   Durvalumab injection What is this medication? DURVALUMAB (dur VAL ue mab) is a monoclonal antibody. It is used to treat lung cancer. This medicine may be used for other purposes; ask your health care provider or pharmacist if you have questions. COMMON BRAND NAME(S): IMFINZI What should I tell my care team  before I take this medication? They need to know if you have any of these conditions: autoimmune diseases like Crohn's disease, ulcerative colitis, or lupus have had or planning to have an allogeneic stem cell transplant (uses someone else's stem cells) history of organ transplant history of radiation to  the chest nervous system problems like myasthenia gravis or Guillain-Barre syndrome an unusual or allergic reaction to durvalumab, other medicines, foods, dyes, or preservatives pregnant or trying to get pregnant breast-feeding How should I use this medication? This medicine is for infusion into a vein. It is given by a health care professional in a hospital or clinic setting. A special MedGuide will be given to you before each treatment. Be sure to read this information carefully each time. Talk to your pediatrician regarding the use of this medicine in children. Special care may be needed. Overdosage: If you think you have taken too much of this medicine contact a poison control center or emergency room at once. NOTE: This medicine is only for you. Do not share this medicine with others. What if I miss a dose? It is important not to miss your dose. Call your doctor or health care professional if you are unable to keep an appointment. What may interact with this medication? Interactions have not been studied. This list may not describe all possible interactions. Give your health care provider a list of all the medicines, herbs, non-prescription drugs, or dietary supplements you use. Also tell them if you smoke, drink alcohol, or use illegal drugs. Some items may interact with your medicine. What should I watch for while using this medication? This drug may make you feel generally unwell. Continue your course of treatment even though you feel ill unless your doctor tells you to stop. You may need blood work done while you are taking this medicine. Do not become pregnant while taking this medicine or for 3 months after stopping it. Women should inform their doctor if they wish to become pregnant or think they might be pregnant. There is a potential for serious side effects to an unborn child. Talk to your health care professional or pharmacist for more information. Do not breast-feed an infant  while taking this medicine or for 3 months after stopping it. What side effects may I notice from receiving this medication? Side effects that you should report to your doctor or health care professional as soon as possible: allergic reactions like skin rash, itching or hives, swelling of the face, lips, or tongue black, tarry stools bloody or watery diarrhea breathing problems change in emotions or moods change in sex drive changes in vision chest pain or chest tightness chills confusion cough facial flushing fever headache signs and symptoms of high blood sugar such as dizziness; dry mouth; dry skin; fruity breath; nausea; stomach pain; increased hunger or thirst; increased urination signs and symptoms of liver injury like dark yellow or brown urine; general ill feeling or flu-like symptoms; light-colored stools; loss of appetite; nausea; right upper belly pain; unusually weak or tired; yellowing of the eyes or skin stomach pain trouble passing urine or change in the amount of urine weight gain or weight loss Side effects that usually do not require medical attention (report these to your doctor or health care professional if they continue or are bothersome): bone pain constipation loss of appetite muscle pain nausea swelling of the ankles, feet, hands tiredness This list may not describe all possible side effects. Call your doctor for medical advice about side effects. You  may report side effects to FDA at 1-800-FDA-1088. Where should I keep my medication? This drug is given in a hospital or clinic and will not be stored at home. NOTE: This sheet is a summary. It may not cover all possible information. If you have questions about this medicine, talk to your doctor, pharmacist, or health care provider.  2022 Elsevier/Gold Standard (2019-11-08 13:01:29)

## 2021-07-06 ENCOUNTER — Other Ambulatory Visit: Payer: Medicare Other

## 2021-07-06 ENCOUNTER — Ambulatory Visit: Payer: Medicare Other | Admitting: Physician Assistant

## 2021-07-06 ENCOUNTER — Ambulatory Visit: Payer: Medicare Other

## 2021-07-09 DIAGNOSIS — J449 Chronic obstructive pulmonary disease, unspecified: Secondary | ICD-10-CM | POA: Diagnosis not present

## 2021-07-09 DIAGNOSIS — M6281 Muscle weakness (generalized): Secondary | ICD-10-CM | POA: Diagnosis not present

## 2021-07-09 DIAGNOSIS — D381 Neoplasm of uncertain behavior of trachea, bronchus and lung: Secondary | ICD-10-CM | POA: Diagnosis not present

## 2021-07-09 DIAGNOSIS — U071 COVID-19: Secondary | ICD-10-CM | POA: Diagnosis not present

## 2021-07-20 ENCOUNTER — Ambulatory Visit: Payer: Medicare Other | Admitting: Physician Assistant

## 2021-07-20 ENCOUNTER — Ambulatory Visit: Payer: Medicare Other

## 2021-07-20 ENCOUNTER — Other Ambulatory Visit: Payer: Medicare Other

## 2021-07-20 ENCOUNTER — Encounter: Payer: Self-pay | Admitting: Internal Medicine

## 2021-07-20 ENCOUNTER — Inpatient Hospital Stay: Payer: Medicare Other

## 2021-07-20 ENCOUNTER — Inpatient Hospital Stay: Payer: Medicare Other | Attending: Internal Medicine

## 2021-07-20 ENCOUNTER — Inpatient Hospital Stay (HOSPITAL_BASED_OUTPATIENT_CLINIC_OR_DEPARTMENT_OTHER): Payer: Medicare Other | Admitting: Internal Medicine

## 2021-07-20 ENCOUNTER — Other Ambulatory Visit: Payer: Self-pay

## 2021-07-20 VITALS — HR 100

## 2021-07-20 VITALS — BP 144/73 | HR 109 | Temp 97.9°F | Resp 20 | Ht 67.0 in | Wt 113.5 lb

## 2021-07-20 DIAGNOSIS — Z79899 Other long term (current) drug therapy: Secondary | ICD-10-CM | POA: Diagnosis not present

## 2021-07-20 DIAGNOSIS — C3491 Malignant neoplasm of unspecified part of right bronchus or lung: Secondary | ICD-10-CM

## 2021-07-20 DIAGNOSIS — J449 Chronic obstructive pulmonary disease, unspecified: Secondary | ICD-10-CM | POA: Diagnosis not present

## 2021-07-20 DIAGNOSIS — C342 Malignant neoplasm of middle lobe, bronchus or lung: Secondary | ICD-10-CM | POA: Insufficient documentation

## 2021-07-20 DIAGNOSIS — Z170 Estrogen receptor positive status [ER+]: Secondary | ICD-10-CM | POA: Diagnosis not present

## 2021-07-20 DIAGNOSIS — C3490 Malignant neoplasm of unspecified part of unspecified bronchus or lung: Secondary | ICD-10-CM

## 2021-07-20 DIAGNOSIS — Z9049 Acquired absence of other specified parts of digestive tract: Secondary | ICD-10-CM | POA: Diagnosis not present

## 2021-07-20 DIAGNOSIS — Z923 Personal history of irradiation: Secondary | ICD-10-CM | POA: Insufficient documentation

## 2021-07-20 DIAGNOSIS — C50412 Malignant neoplasm of upper-outer quadrant of left female breast: Secondary | ICD-10-CM | POA: Diagnosis not present

## 2021-07-20 DIAGNOSIS — Z8616 Personal history of COVID-19: Secondary | ICD-10-CM | POA: Diagnosis not present

## 2021-07-20 DIAGNOSIS — I10 Essential (primary) hypertension: Secondary | ICD-10-CM | POA: Diagnosis not present

## 2021-07-20 DIAGNOSIS — Z5112 Encounter for antineoplastic immunotherapy: Secondary | ICD-10-CM

## 2021-07-20 LAB — TSH: TSH: 1.115 u[IU]/mL (ref 0.308–3.960)

## 2021-07-20 LAB — CBC WITH DIFFERENTIAL (CANCER CENTER ONLY)
Abs Immature Granulocytes: 0.02 10*3/uL (ref 0.00–0.07)
Basophils Absolute: 0 10*3/uL (ref 0.0–0.1)
Basophils Relative: 1 %
Eosinophils Absolute: 0.2 10*3/uL (ref 0.0–0.5)
Eosinophils Relative: 3 %
HCT: 31.1 % — ABNORMAL LOW (ref 36.0–46.0)
Hemoglobin: 10.2 g/dL — ABNORMAL LOW (ref 12.0–15.0)
Immature Granulocytes: 0 %
Lymphocytes Relative: 24 %
Lymphs Abs: 1.3 10*3/uL (ref 0.7–4.0)
MCH: 31.3 pg (ref 26.0–34.0)
MCHC: 32.8 g/dL (ref 30.0–36.0)
MCV: 95.4 fL (ref 80.0–100.0)
Monocytes Absolute: 0.4 10*3/uL (ref 0.1–1.0)
Monocytes Relative: 7 %
Neutro Abs: 3.5 10*3/uL (ref 1.7–7.7)
Neutrophils Relative %: 65 %
Platelet Count: 259 10*3/uL (ref 150–400)
RBC: 3.26 MIL/uL — ABNORMAL LOW (ref 3.87–5.11)
RDW: 14.2 % (ref 11.5–15.5)
WBC Count: 5.4 10*3/uL (ref 4.0–10.5)
nRBC: 0 % (ref 0.0–0.2)

## 2021-07-20 LAB — CMP (CANCER CENTER ONLY)
ALT: 6 U/L (ref 0–44)
AST: 12 U/L — ABNORMAL LOW (ref 15–41)
Albumin: 3.1 g/dL — ABNORMAL LOW (ref 3.5–5.0)
Alkaline Phosphatase: 137 U/L — ABNORMAL HIGH (ref 38–126)
Anion gap: 7 (ref 5–15)
BUN: 25 mg/dL — ABNORMAL HIGH (ref 8–23)
CO2: 27 mmol/L (ref 22–32)
Calcium: 9.2 mg/dL (ref 8.9–10.3)
Chloride: 103 mmol/L (ref 98–111)
Creatinine: 0.98 mg/dL (ref 0.44–1.00)
GFR, Estimated: 59 mL/min — ABNORMAL LOW (ref >60)
Glucose, Bld: 224 mg/dL — ABNORMAL HIGH (ref 70–99)
Potassium: 4.3 mmol/L (ref 3.5–5.1)
Sodium: 137 mmol/L (ref 135–145)
Total Bilirubin: 0.2 mg/dL — ABNORMAL LOW (ref 0.3–1.2)
Total Protein: 6.6 g/dL (ref 6.5–8.1)

## 2021-07-20 MED ORDER — SODIUM CHLORIDE 0.9 % IV SOLN
1500.0000 mg | Freq: Once | INTRAVENOUS | Status: AC
  Administered 2021-07-20: 1500 mg via INTRAVENOUS
  Filled 2021-07-20: qty 30

## 2021-07-20 MED ORDER — SODIUM CHLORIDE 0.9 % IV SOLN: Freq: Once | INTRAVENOUS | Status: AC

## 2021-07-20 NOTE — Patient Instructions (Signed)
[facility]  Discharge Instructions: Thank you for choosing [facility] to provide your oncology and hematology care.   If you have a lab appointment with the [facility], please go directly to the [facility] and check in at the registration area.   Wear comfortable clothing and clothing appropriate for easy access to any Portacath or PICC line.   We strive to give you quality time with your provider. You may need to reschedule your appointment if you arrive late (15 or more minutes).  Arriving late affects you and other patients whose appointments are after yours.  Also, if you miss three or more appointments without notifying the office, you may be dismissed from the clinic at the provider's discretion.      For prescription refill requests, have your pharmacy contact our office and allow 72 hours for refills to be completed.    Today you received the following chemotherapy and/or immunotherapy agents: Imfinzi   To help prevent nausea and vomiting after your treatment, we encourage you to take your nausea medication as directed.  BELOW ARE SYMPTOMS THAT SHOULD BE REPORTED IMMEDIATELY: *FEVER GREATER THAN 100.4 F (38 C) OR HIGHER *CHILLS OR SWEATING *NAUSEA AND VOMITING THAT IS NOT CONTROLLED WITH YOUR NAUSEA MEDICATION *UNUSUAL SHORTNESS OF BREATH *UNUSUAL BRUISING OR BLEEDING *URINARY PROBLEMS (pain or burning when urinating, or frequent urination) *BOWEL PROBLEMS (unusual diarrhea, constipation, pain near the anus) TENDERNESS IN MOUTH AND THROAT WITH OR WITHOUT PRESENCE OF ULCERS (sore throat, sores in mouth, or a toothache) UNUSUAL RASH, SWELLING OR PAIN  UNUSUAL VAGINAL DISCHARGE OR ITCHING   Items with * indicate a potential emergency and should be followed up as soon as possible or go to the Emergency Department if any problems should occur.  Please show the CHEMOTHERAPY ALERT CARD or IMMUNOTHERAPY ALERT CARD at check-in to the  Emergency Department and triage nurse.  Should you have questions after your visit or need to cancel or reschedule your appointment, please contact [facility]  Dept: [phone]  and follow the prompts.  Office hours are 8:00 a.m. to 4:30 p.m. Monday - Friday. Please note that voicemails left after 4:00 p.m. may not be returned until the following business day.  We are closed weekends and major holidays. You have access to a nurse at all times for urgent questions. Please call the main number to the clinic Dept: [phone] and follow the prompts.   For any non-urgent questions, you may also contact your provider using MyChart. We now offer e-Visits for anyone 70 and older to request care online for non-urgent symptoms. For details visit [email or web address].   Also download the MyChart app! Go to the app store, search "MyChart", open the app, select [facility], and log in with your MyChart username and password.  Due to Covid, a mask is required upon entering the hospital/clinic. If you do not have a mask, one will be given to you upon arrival. For doctor visits, patients may have 1 support person aged 103 or older with them. For treatment visits, patients cannot have anyone with them due to current Covid guidelines and our immunocompromised population.   Durvalumab injection What is this medication? DURVALUMAB (dur VAL ue mab) is a monoclonal antibody. It is used to treat lung cancer. This medicine may be used for other purposes; ask your health care provider or pharmacist if you have questions. COMMON BRAND NAME(S): IMFINZI What should I tell my care team  before I take this medication? They need to know if you have any of these conditions: autoimmune diseases like Crohn's disease, ulcerative colitis, or lupus have had or planning to have an allogeneic stem cell transplant (uses someone else's stem cells) history of organ transplant history of radiation to  the chest nervous system problems like myasthenia gravis or Guillain-Barre syndrome an unusual or allergic reaction to durvalumab, other medicines, foods, dyes, or preservatives pregnant or trying to get pregnant breast-feeding How should I use this medication? This medicine is for infusion into a vein. It is given by a health care professional in a hospital or clinic setting. A special MedGuide will be given to you before each treatment. Be sure to read this information carefully each time. Talk to your pediatrician regarding the use of this medicine in children. Special care may be needed. Overdosage: If you think you have taken too much of this medicine contact a poison control center or emergency room at once. NOTE: This medicine is only for you. Do not share this medicine with others. What if I miss a dose? It is important not to miss your dose. Call your doctor or health care professional if you are unable to keep an appointment. What may interact with this medication? Interactions have not been studied. This list may not describe all possible interactions. Give your health care provider a list of all the medicines, herbs, non-prescription drugs, or dietary supplements you use. Also tell them if you smoke, drink alcohol, or use illegal drugs. Some items may interact with your medicine. What should I watch for while using this medication? This drug may make you feel generally unwell. Continue your course of treatment even though you feel ill unless your doctor tells you to stop. You may need blood work done while you are taking this medicine. Do not become pregnant while taking this medicine or for 3 months after stopping it. Women should inform their doctor if they wish to become pregnant or think they might be pregnant. There is a potential for serious side effects to an unborn child. Talk to your health care professional or pharmacist for more information. Do not breast-feed an infant  while taking this medicine or for 3 months after stopping it. What side effects may I notice from receiving this medication? Side effects that you should report to your doctor or health care professional as soon as possible: allergic reactions like skin rash, itching or hives, swelling of the face, lips, or tongue black, tarry stools bloody or watery diarrhea breathing problems change in emotions or moods change in sex drive changes in vision chest pain or chest tightness chills confusion cough facial flushing fever headache signs and symptoms of high blood sugar such as dizziness; dry mouth; dry skin; fruity breath; nausea; stomach pain; increased hunger or thirst; increased urination signs and symptoms of liver injury like dark yellow or brown urine; general ill feeling or flu-like symptoms; light-colored stools; loss of appetite; nausea; right upper belly pain; unusually weak or tired; yellowing of the eyes or skin stomach pain trouble passing urine or change in the amount of urine weight gain or weight loss Side effects that usually do not require medical attention (report these to your doctor or health care professional if they continue or are bothersome): bone pain constipation loss of appetite muscle pain nausea swelling of the ankles, feet, hands tiredness This list may not describe all possible side effects. Call your doctor for medical advice about side effects. You  may report side effects to FDA at 1-800-FDA-1088. Where should I keep my medication? This drug is given in a hospital or clinic and will not be stored at home. NOTE: This sheet is a summary. It may not cover all possible information. If you have questions about this medicine, talk to your doctor, pharmacist, or health care provider.  2022 Elsevier/Gold Standard (2019-11-08 13:01:29)

## 2021-07-20 NOTE — Progress Notes (Signed)
[facility] [phone]   Fax:([phone]) [place], [place] [address]  DIAGNOSIS: Stage IIIb (T3, N2, MX) non-small cell lung cancer,  squamous cell carcinoma. She presented with large right middle lobe lung mass in addition to subcarinal lymphadenopathy.  She was diagnosed in May 2022   PDL1: 95%   PRIOR THERAPY: Concurrent chemoradiation with carboplatin for an AUC of 2 and paclitaxel 45 mg/m2.  First dose expected on 03/02/2021.  Status post 7 cycles with partial response   CURRENT THERAPY: Consolidation treatment with immunotherapy with Imfinzi 1500 Mg IV every 4 weeks.  First dose June 24, 2021 status post 1 cycle.  INTERVAL HISTORY: Dawn [name] 80 y.o. female returns to the clinic today for follow-up visit.  The patient is feeling fine today with no concerning complaints.  She tolerated the first cycle of her treatment with consolidation immunotherapy fairly well.  She denied having any current chest pain, shortness of breath, cough or hemoptysis.  She denied having any nausea, vomiting, diarrhea or constipation.  She has no headache or visual changes.  She is here today for evaluation before starting cycle #2 of her treatment.  MEDICAL HISTORY: Past Medical History:  Diagnosis Date   Anemia    Anxiety    Arthritis    Breast cancer of upper-outer quadrant of left female breast ([facility]) 08/18/2015   Broken wrist    when younger x2   COPD (chronic obstructive pulmonary disease) ([facility])    Hospitalization March 2011   COVID-19 01/22/2021   Dementia ([facility])    Depression    Diabetes mellitus without complication ([facility])    type 2   Dyspnea    Full dentures    H/O vaginal delivery 1962   x1    High cholesterol    History of home oxygen therapy    Hypertension    Memory changes    sharp decline over the last 3 months, per son as of 01/22/21,    Pneumonia 01/22/2021   Radiation     11/18/15- 12/16/15 to her Left Breast    ALLERGIES:  is allergic to ferumoxytol, pneumococcal vaccines, and repaglinide.  MEDICATIONS:  Current Outpatient Medications  Medication Sig Dispense Refill   acetaminophen-codeine (TYLENOL #3) 300-30 MG tablet Take 1 tablet by mouth every 6 (six) hours as needed.     albuterol (VENTOLIN HFA) 108 (90 Base) MCG/ACT inhaler Inhale 2 puffs into the lungs every 2 (two) hours as needed for wheezing or shortness of breath. Please instruct in usage 1 each 2   aspirin EC 81 MG tablet Take 81 mg by mouth daily.     Cholecalciferol (VITAMIN D3) 125 MCG (5000 UT) CAPS 1 tablet     ferrous sulfate 325 (65 FE) MG tablet Take 325 mg by mouth 2 (two) times daily.     glipiZIDE (GLUCOTROL) 5 MG tablet Take 5 mg by mouth every morning.     Lancets (ONETOUCH ULTRASOFT) lancets USE TO TEST BLOOD GLUCOSE ONCE A DAY     LANTUS 100 UNIT/ML injection Inject 5 Units into the skin at bedtime.     memantine (NAMENDA) 5 MG tablet Take by mouth.     metFORMIN (GLUCOPHAGE-XR) 500 MG 24 hr tablet 1 tablet with evening meal     nitroGLYCERIN (NITROSTAT) 0.4 MG SL tablet Place 0.4 mg under the tongue every 15 (fifteen) minutes as needed for chest pain.  pantoprazole (PROTONIX) 40 MG tablet Take 1 tablet (40 mg total) by mouth daily before breakfast. 30 tablet 0   pentoxifylline (TRENTAL) 400 MG CR tablet 1 tablet with meals     pioglitazone (ACTOS) 15 MG tablet Take 1 tablet (15 mg total) by mouth daily. 30 tablet 1   prochlorperazine (COMPAZINE) 10 MG tablet Take 1 tablet (10 mg total) by mouth every 6 (six) hours as needed. 30 tablet 2   rosuvastatin (CRESTOR) 40 MG tablet Take 1 tablet (40 mg total) by mouth daily. 30 tablet 0   Tiotropium Bromide-Olodaterol (STIOLTO RESPIMAT) 2.5-2.5 MCG/ACT AERS Inhale 2 puffs into the lungs daily. 4 g 0   traMADol (ULTRAM) 50 MG tablet 1 tablet as needed     TRUEPLUS INSULIN SYRINGE 31G X 5/16" 1 ML MISC INJECT AS DIRECTED ONCE A DAY     No  current facility-administered medications for this visit.    SURGICAL HISTORY:  Past Surgical History:  Procedure Laterality Date   ABDOMINAL HYSTERECTOMY     APPENDECTOMY     BRONCHIAL BIOPSY  02/16/2021   Procedure: BRONCHIAL BIOPSIES;  Surgeon: Collene Gobble, MD;  Location: [facility];  Service: Pulmonary;;   BRONCHIAL BRUSHINGS  02/16/2021   Procedure: BRONCHIAL BRUSHINGS;  Surgeon: Collene Gobble, MD;  Location: [facility];  Service: Pulmonary;;   BRONCHIAL NEEDLE ASPIRATION BIOPSY  02/16/2021   Procedure: BRONCHIAL NEEDLE ASPIRATION BIOPSIES;  Surgeon: Collene Gobble, MD;  Location: [facility];  Service: Pulmonary;;   CHOLECYSTECTOMY     EYE SURGERY     cataracts   RADIOACTIVE SEED GUIDED PARTIAL MASTECTOMY WITH AXILLARY SENTINEL LYMPH NODE BIOPSY Left 09/17/2015   Procedure: RADIOACTIVE SEED LOCALIZATION LEFT BREAST LUMPECTOMY AND LEFT AXILLARY SENTINEL LYMPH NODE BIOPSY;  Surgeon: Excell Seltzer, MD;  Location: [facility];  Service: General;  Laterality: Left;   TONSILLECTOMY     VIDEO BRONCHOSCOPY WITH ENDOBRONCHIAL NAVIGATION Right 02/16/2021   Procedure: VIDEO BRONCHOSCOPY WITH ENDOBRONCHIAL NAVIGATION;  Surgeon: Collene Gobble, MD;  Location: [facility];  Service: Pulmonary;  Laterality: Right;    REVIEW OF SYSTEMS:  A comprehensive review of systems was negative.   PHYSICAL EXAMINATION: General appearance: alert, cooperative, and no distress Head: Normocephalic, without obvious abnormality, atraumatic Neck: no adenopathy, no JVD, supple, symmetrical, trachea midline, and thyroid not enlarged, symmetric, no tenderness/mass/nodules Lymph nodes: Cervical, supraclavicular, and axillary nodes normal. Resp: clear to auscultation bilaterally Back: symmetric, no curvature. ROM normal. No CVA tenderness. Cardio: regular rate and rhythm, S1, S2 normal, no murmur, click, rub or gallop GI: soft, non-tender; bowel sounds normal; no masses,  no organomegaly Extremities: extremities  normal, atraumatic, no cyanosis or edema  ECOG PERFORMANCE STATUS: 1 - Symptomatic but completely ambulatory  Blood pressure (!) 144/73, pulse (!) 109, temperature 97.9 F (36.6 C), temperature source Tympanic, resp. rate 20, height '5\' 7"'  (1.702 m), weight 113 lb 8 oz (51.5 kg), SpO2 94 %.  LABORATORY DATA: Lab Results  Component Value Date   WBC 3.5 (L) 06/23/2021   HGB 9.3 (L) 06/23/2021   HCT 29.8 (L) 06/23/2021   MCV 98.7 06/23/2021   PLT 199 06/23/2021      Chemistry      Component Value Date/Time   NA 139 06/23/2021 1256   NA 137 08/20/2015 1232   K 3.9 06/23/2021 1256   K 4.3 08/20/2015 1232   CL 102 06/23/2021 1256   CO2 27 06/23/2021 1256   CO2 25 08/20/2015 1232   BUN 20 06/23/2021 1256  BUN 10.4 08/20/2015 1232   CREATININE 1.18 (H) 06/23/2021 1256   CREATININE 1.1 08/20/2015 1232      Component Value Date/Time   CALCIUM 9.2 06/23/2021 1256   CALCIUM 10.0 08/20/2015 1232   ALKPHOS 138 (H) 06/23/2021 1256   ALKPHOS 142 08/20/2015 1232   AST 10 (L) 06/23/2021 1256   AST 18 08/20/2015 1232   ALT <5 06/23/2021 1256   ALT 11 08/20/2015 1232   BILITOT 0.3 06/23/2021 1256   BILITOT 0.32 08/20/2015 1232       RADIOGRAPHIC STUDIES: No results found.  ASSESSMENT AND PLAN: This is a very pleasant 80 years old white female with a stage IIIb (T3, N2, M0) non-small cell lung cancer, squamous cell carcinoma presented with large right middle lobe lung mass in addition to subcarinal lymphadenopathy diagnosed in May 2022 with PD-L1 expression of 95%. The patient underwent a course of concurrent chemoradiation with weekly carboplatin for AUC of 2 and paclitaxel 45 Mg/M2 status post 7 cycles of treatment.  The patient had partial response to this treatment. She is currently undergoing consolidation treatment with Imfinzi 1500 Mg IV every 4 weeks status post 1 cycle. She tolerated the first cycle of her treatment fairly well with no concerning adverse effects. I  recommended for her to proceed with cycle #2 today as planned. She will come back for follow-up visit in 4 weeks for evaluation before starting cycle #3. The patient was advised to call immediately if she has any other concerning issues in the interval.  The patient voices understanding of current disease status and treatment options and is in agreement with the current care plan.  All questions were answered. The patient knows to call the clinic with any problems, questions or concerns. We can certainly see the patient much sooner if necessary.   Disclaimer: This note was dictated with voice recognition software. Similar sounding words can inadvertently be transcribed and may not be corrected upon review.

## 2021-08-14 NOTE — Progress Notes (Signed)
[facility] OFFICE PROGRESS NOTE  Lawerance Cruel, MD [place] [address]  DIAGNOSIS: Stage IIIb (T3, N2, MX) non-small cell lung cancer,  squamous cell carcinoma. She presented with large right middle lobe lung mass in addition to subcarinal lymphadenopathy.  She was diagnosed in May 2022   PDL1: 95%  PRIOR THERAPY: Concurrent chemoradiation with carboplatin for an AUC of 2 and paclitaxel 45 mg per metered squared. Last dose 04/20/21. Status post 7 cycles.   CURRENT THERAPY: Consolidation immunotherapy with Imfinzi 1500 mg IV every 4 weeks.  First dose expected on 06/23/2021. Status post 2 cycles.   INTERVAL HISTORY: Dawn [name] 80 y.o. female returns to the clinic today for a follow-up visit accompanied. The patient is alone today and has some baseline memory deficits. The patient is feeling well today without any concerning complaints. The patient continues to tolerate treatment with immunotherapy with Imfinzi well without any adverse effects.  She denies any recent fever, chills, or night sweats. Her breathing is stable. She denies dyspnea.  She denies any cough or hemoptysis. She has gained weight since her last appointment and states she is eating well. She sometimes has sternal pain for which she is prescribed tramadol but she denies any chest discomfort today.  Denies any nausea, vomiting, diarrhea, or constipation.  Denies any visual changes she states she has a headache "now and then" but nothing out of the ordinary. The patient is here today for evaluation prior to starting cycle # 3   MEDICAL HISTORY: Past Medical History:  Diagnosis Date   Anemia    Anxiety    Arthritis    Breast cancer of upper-outer quadrant of left female breast ([place]) 08/18/2015   Broken wrist    when younger x2   COPD (chronic obstructive pulmonary disease) ([facility])    Hospitalization March 2011   COVID-19 01/22/2021   Dementia ([facility])    Depression    Diabetes mellitus  without complication ([facility])    type 2   Dyspnea    Full dentures    H/O vaginal delivery 1962   x1    High cholesterol    History of home oxygen therapy    Hypertension    Memory changes    sharp decline over the last 3 months, per son as of 01/22/21,    Pneumonia 01/22/2021   Radiation    11/18/15- 12/16/15 to her Left Breast    ALLERGIES:  is allergic to ferumoxytol, pneumococcal vaccines, and repaglinide.  MEDICATIONS:  Current Outpatient Medications  Medication Sig Dispense Refill   acetaminophen-codeine (TYLENOL #3) 300-30 MG tablet Take 1 tablet by mouth every 6 (six) hours as needed.     albuterol (VENTOLIN HFA) 108 (90 Base) MCG/ACT inhaler Inhale 2 puffs into the lungs every 2 (two) hours as needed for wheezing or shortness of breath. Please instruct in usage 1 each 2   aspirin EC 81 MG tablet Take 81 mg by mouth daily.     Cholecalciferol (VITAMIN D3) 125 MCG (5000 UT) CAPS 1 tablet     ferrous sulfate 325 (65 FE) MG tablet Take 325 mg by mouth 2 (two) times daily.     glipiZIDE (GLUCOTROL) 5 MG tablet Take 5 mg by mouth every morning.     Lancets (ONETOUCH ULTRASOFT) lancets USE TO TEST BLOOD GLUCOSE ONCE A DAY     LANTUS 100 UNIT/ML injection Inject 5 Units into the skin at bedtime.     memantine (NAMENDA)  5 MG tablet Take by mouth.     metFORMIN (GLUCOPHAGE-XR) 500 MG 24 hr tablet 1 tablet with evening meal     nitroGLYCERIN (NITROSTAT) 0.4 MG SL tablet Place 0.4 mg under the tongue every 15 (fifteen) minutes as needed for chest pain.     pantoprazole (PROTONIX) 40 MG tablet Take 1 tablet (40 mg total) by mouth daily before breakfast. 30 tablet 0   pentoxifylline (TRENTAL) 400 MG CR tablet 1 tablet with meals     pioglitazone (ACTOS) 15 MG tablet Take 1 tablet (15 mg total) by mouth daily. 30 tablet 1   prochlorperazine (COMPAZINE) 10 MG tablet Take 1 tablet (10 mg total) by mouth every 6 (six) hours as needed. 30 tablet 2   rosuvastatin (CRESTOR) 40 MG tablet Take 1 tablet  (40 mg total) by mouth daily. 30 tablet 0   Tiotropium Bromide-Olodaterol (STIOLTO RESPIMAT) 2.5-2.5 MCG/ACT AERS Inhale 2 puffs into the lungs daily. 4 g 0   traMADol (ULTRAM) 50 MG tablet 1 tablet as needed     TRUEPLUS INSULIN SYRINGE 31G X 5/16" 1 ML MISC INJECT AS DIRECTED ONCE A DAY     No current facility-administered medications for this visit.    SURGICAL HISTORY:  Past Surgical History:  Procedure Laterality Date   ABDOMINAL HYSTERECTOMY     APPENDECTOMY     BRONCHIAL BIOPSY  02/16/2021   Procedure: BRONCHIAL BIOPSIES;  Surgeon: Collene Gobble, MD;  Location: [facility];  Service: Pulmonary;;   BRONCHIAL BRUSHINGS  02/16/2021   Procedure: BRONCHIAL BRUSHINGS;  Surgeon: Collene Gobble, MD;  Location: [facility];  Service: Pulmonary;;   BRONCHIAL NEEDLE ASPIRATION BIOPSY  02/16/2021   Procedure: BRONCHIAL NEEDLE ASPIRATION BIOPSIES;  Surgeon: Collene Gobble, MD;  Location: [facility];  Service: Pulmonary;;   CHOLECYSTECTOMY     EYE SURGERY     cataracts   RADIOACTIVE SEED GUIDED PARTIAL MASTECTOMY WITH AXILLARY SENTINEL LYMPH NODE BIOPSY Left 09/17/2015   Procedure: RADIOACTIVE SEED LOCALIZATION LEFT BREAST LUMPECTOMY AND LEFT AXILLARY SENTINEL LYMPH NODE BIOPSY;  Surgeon: Excell Seltzer, MD;  Location: [facility];  Service: General;  Laterality: Left;   TONSILLECTOMY     VIDEO BRONCHOSCOPY WITH ENDOBRONCHIAL NAVIGATION Right 02/16/2021   Procedure: VIDEO BRONCHOSCOPY WITH ENDOBRONCHIAL NAVIGATION;  Surgeon: Collene Gobble, MD;  Location: [facility];  Service: Pulmonary;  Laterality: Right;    REVIEW OF SYSTEMS:   Review of Systems  Constitutional: Negative for appetite change, chills, fatigue, fever and unexpected weight change.  HENT: Negative for mouth sores, nosebleeds, sore throat and trouble swallowing.   Eyes: Negative for eye problems and icterus.  Respiratory: Positive for dyspnea on exertion.  Negative for cough, hemoptysis, and wheezing.    Cardiovascular:  Negative for chest pain and leg swelling.  Gastrointestinal: Negative for abdominal pain, constipation, diarrhea, nausea and vomiting.  Genitourinary: Negative for bladder incontinence, difficulty urinating, dysuria, frequency and hematuria.   Musculoskeletal: Negative for back pain, gait problem, neck pain and neck stiffness.  Skin: Negative for itching and rash.  Neurological: Negative for dizziness, extremity weakness, gait problem, headaches, light-headedness and seizures.  Hematological: Negative for adenopathy. Does not bruise/bleed easily.  Psychiatric/Behavioral: Positive for baseline memory deficits. Negative for confusion, depression and sleep disturbance. The patient is not nervous/anxious.     PHYSICAL EXAMINATION:  Blood pressure 131/65, pulse (!) 107, temperature (!) 97.2 F (36.2 C), temperature source Tympanic, resp. rate 17, weight 117 lb 4 oz (53.2 kg), SpO2 95 %.  ECOG PERFORMANCE STATUS:  2  Physical Exam  Constitutional: Oriented to person, place, and time and cachectic appearing female and in no distress.  HENT:  Head: Normocephalic and atraumatic.  Mouth/Throat: Oropharynx is clear and moist. No oropharyngeal exudate.  Eyes: Conjunctivae are normal. Right eye exhibits no discharge. Left eye exhibits no discharge. No scleral icterus.  Neck: Normal range of motion. Neck supple.  Cardiovascular: Normal rate, regular rhythm, normal heart sounds and intact distal pulses.   Pulmonary/Chest: Effort normal and breath sounds normal. No respiratory distress. No wheezes. No rales.  Abdominal: Soft. Bowel sounds are normal. Exhibits no distension and no mass. There is no tenderness.  Neurological: Alert and oriented to person, place, and time. Exhibits muscle wasting.  Patient is ambulates with a walker.   Lymphadenopathy:    No cervical adenopathy.  Neurological: Alert and oriented to person, place, and time. Exhibits normal muscle tone. Gait normal. Coordination normal.   Skin: Skin is warm and dry. No rash noted. Not diaphoretic. No erythema. No pallor.  Psychiatric: Mood, memory and judgment normal.  Vitals reviewed.  LABORATORY DATA: Lab Results  Component Value Date   WBC 4.4 08/17/2021   HGB 11.8 (L) 08/17/2021   HCT 38.6 08/17/2021   MCV 96.5 08/17/2021   PLT 267 08/17/2021      Chemistry      Component Value Date/Time   NA 137 07/20/2021 1347   NA 137 08/20/2015 1232   K 4.3 07/20/2021 1347   K 4.3 08/20/2015 1232   CL 103 07/20/2021 1347   CO2 27 07/20/2021 1347   CO2 25 08/20/2015 1232   BUN 25 (H) 07/20/2021 1347   BUN 10.4 08/20/2015 1232   CREATININE 0.98 07/20/2021 1347   CREATININE 1.1 08/20/2015 1232      Component Value Date/Time   CALCIUM 9.2 07/20/2021 1347   CALCIUM 10.0 08/20/2015 1232   ALKPHOS 137 (H) 07/20/2021 1347   ALKPHOS 142 08/20/2015 1232   AST 12 (L) 07/20/2021 1347   AST 18 08/20/2015 1232   ALT 6 07/20/2021 1347   ALT 11 08/20/2015 1232   BILITOT 0.2 (L) 07/20/2021 1347   BILITOT 0.32 08/20/2015 1232       RADIOGRAPHIC STUDIES:  No results found.   ASSESSMENT/PLAN:  This is a very pleasant 80 year old Caucasian female diagnosed with stage IIIb (T3, N2, Mx) non-small cell lung cancer, squamous cell carcinoma.  She presented with a large right middle lobe lung mass in addition to subcarinal lymphadenopathy.  She was diagnosed in May 2022.  PD-L1 testing was 95%    The patient is currently undergoing concurrent chemoradiation with carboplatin for an [facility] of 2 and paclitaxel 45 mg per metered square. She is status post 7 cycles.  Her last dose of treatment was on 04/20/2021.  She is currently undergoing consolidation immunotherapy with Imfinzi 1500 mg IV every 4 weeks. Status post 2 cycles.   Labs were reviewed. Recommend that she proceed with cycle #3 today as scheduled.   I will arrange for a restaging CT scan prior to starting the next cycle of treatment. I have written a summary for the patient  to give to her son, [name], letting him know we would like to arrange for a restaging scan before she is seen next time to assess for treatment response.    We will see her back for a follow up visit in 4 weeks for evaluation before starting cycle #4  The patient was advised to call immediately if she has any concerning  symptoms in the interval. The patient voices understanding of current disease status and treatment options and is in agreement with the current care plan. All questions were answered. The patient knows to call the clinic with any problems, questions or concerns. We can certainly see the patient much sooner if necessary      Orders Placed This Encounter  Procedures   CT Chest W Contrast    Standing Status:   Future    Standing Expiration Date:   08/17/2022    Order Specific Question:   If indicated for the ordered procedure, I authorize the administration of contrast media per Radiology protocol    Answer:   Yes    Order Specific Question:   Preferred imaging location?    Answer:   [facility]     The total time spent in the appointment was 20-29 minutes.   Bhumi Godbey L Raniyah Curenton, PA-C 08/17/21

## 2021-08-17 ENCOUNTER — Other Ambulatory Visit: Payer: Self-pay

## 2021-08-17 ENCOUNTER — Inpatient Hospital Stay (HOSPITAL_BASED_OUTPATIENT_CLINIC_OR_DEPARTMENT_OTHER): Payer: Medicare Other | Admitting: Physician Assistant

## 2021-08-17 ENCOUNTER — Inpatient Hospital Stay: Payer: Medicare Other

## 2021-08-17 ENCOUNTER — Inpatient Hospital Stay: Payer: Medicare Other | Attending: Internal Medicine

## 2021-08-17 VITALS — BP 131/65 | HR 107 | Temp 97.2°F | Resp 17 | Wt 117.2 lb

## 2021-08-17 VITALS — HR 98

## 2021-08-17 DIAGNOSIS — Z8616 Personal history of COVID-19: Secondary | ICD-10-CM | POA: Insufficient documentation

## 2021-08-17 DIAGNOSIS — Z9049 Acquired absence of other specified parts of digestive tract: Secondary | ICD-10-CM | POA: Insufficient documentation

## 2021-08-17 DIAGNOSIS — R590 Localized enlarged lymph nodes: Secondary | ICD-10-CM | POA: Insufficient documentation

## 2021-08-17 DIAGNOSIS — Z170 Estrogen receptor positive status [ER+]: Secondary | ICD-10-CM | POA: Insufficient documentation

## 2021-08-17 DIAGNOSIS — Z79899 Other long term (current) drug therapy: Secondary | ICD-10-CM | POA: Insufficient documentation

## 2021-08-17 DIAGNOSIS — J449 Chronic obstructive pulmonary disease, unspecified: Secondary | ICD-10-CM | POA: Diagnosis not present

## 2021-08-17 DIAGNOSIS — R519 Headache, unspecified: Secondary | ICD-10-CM | POA: Insufficient documentation

## 2021-08-17 DIAGNOSIS — C50412 Malignant neoplasm of upper-outer quadrant of left female breast: Secondary | ICD-10-CM | POA: Diagnosis not present

## 2021-08-17 DIAGNOSIS — Z5112 Encounter for antineoplastic immunotherapy: Secondary | ICD-10-CM

## 2021-08-17 DIAGNOSIS — C3490 Malignant neoplasm of unspecified part of unspecified bronchus or lung: Secondary | ICD-10-CM

## 2021-08-17 DIAGNOSIS — I10 Essential (primary) hypertension: Secondary | ICD-10-CM | POA: Insufficient documentation

## 2021-08-17 DIAGNOSIS — R0609 Other forms of dyspnea: Secondary | ICD-10-CM | POA: Insufficient documentation

## 2021-08-17 DIAGNOSIS — C342 Malignant neoplasm of middle lobe, bronchus or lung: Secondary | ICD-10-CM | POA: Insufficient documentation

## 2021-08-17 DIAGNOSIS — C3491 Malignant neoplasm of unspecified part of right bronchus or lung: Secondary | ICD-10-CM | POA: Diagnosis not present

## 2021-08-17 LAB — CBC WITH DIFFERENTIAL (CANCER CENTER ONLY)
Abs Immature Granulocytes: 0.02 10*3/uL (ref 0.00–0.07)
Basophils Absolute: 0 10*3/uL (ref 0.0–0.1)
Basophils Relative: 0 %
Eosinophils Absolute: 0.1 10*3/uL (ref 0.0–0.5)
Eosinophils Relative: 3 %
HCT: 38.6 % (ref 36.0–46.0)
Hemoglobin: 11.8 g/dL — ABNORMAL LOW (ref 12.0–15.0)
Immature Granulocytes: 1 %
Lymphocytes Relative: 24 %
Lymphs Abs: 1 10*3/uL (ref 0.7–4.0)
MCH: 29.5 pg (ref 26.0–34.0)
MCHC: 30.6 g/dL (ref 30.0–36.0)
MCV: 96.5 fL (ref 80.0–100.0)
Monocytes Absolute: 0.4 10*3/uL (ref 0.1–1.0)
Monocytes Relative: 9 %
Neutro Abs: 2.8 10*3/uL (ref 1.7–7.7)
Neutrophils Relative %: 63 %
Platelet Count: 267 10*3/uL (ref 150–400)
RBC: 4 MIL/uL (ref 3.87–5.11)
RDW: 14.5 % (ref 11.5–15.5)
WBC Count: 4.4 10*3/uL (ref 4.0–10.5)
nRBC: 0 % (ref 0.0–0.2)

## 2021-08-17 LAB — TSH: TSH: 0.855 u[IU]/mL (ref 0.308–3.960)

## 2021-08-17 LAB — CMP (CANCER CENTER ONLY)
ALT: 14 U/L (ref 0–44)
AST: 23 U/L (ref 15–41)
Albumin: 3.8 g/dL (ref 3.5–5.0)
Alkaline Phosphatase: 119 U/L (ref 38–126)
Anion gap: 10 (ref 5–15)
BUN: 41 mg/dL — ABNORMAL HIGH (ref 8–23)
CO2: 24 mmol/L (ref 22–32)
Calcium: 9.8 mg/dL (ref 8.9–10.3)
Chloride: 104 mmol/L (ref 98–111)
Creatinine: 1.01 mg/dL — ABNORMAL HIGH (ref 0.44–1.00)
GFR, Estimated: 57 mL/min — ABNORMAL LOW (ref >60)
Glucose, Bld: 225 mg/dL — ABNORMAL HIGH (ref 70–99)
Potassium: 4.3 mmol/L (ref 3.5–5.1)
Sodium: 138 mmol/L (ref 135–145)
Total Bilirubin: 0.4 mg/dL (ref 0.3–1.2)
Total Protein: 7.9 g/dL (ref 6.5–8.1)

## 2021-08-17 MED ORDER — SODIUM CHLORIDE 0.9 % IV SOLN: Freq: Once | INTRAVENOUS | Status: AC

## 2021-08-17 MED ORDER — SODIUM CHLORIDE 0.9 % IV SOLN
1500.0000 mg | Freq: Once | INTRAVENOUS | Status: AC
  Administered 2021-08-17: 1500 mg via INTRAVENOUS
  Filled 2021-08-17: qty 30

## 2021-08-17 NOTE — Patient Instructions (Signed)
[facility]COL[facility]  Discharge Instructions: °Thank you for choosing [facility] to provide your oncology and hematology care.  ° °If you have a lab appointment with the Cancer Center, please go directly to the Cancer Center and check in at the registration area. °  °Wear comfortable clothing and clothing appropriate for easy access to any Portacath or PICC line.  ° °We strive to give you quality time with your provider. You may need to reschedule your appointment if you arrive late (15 or more minutes).  Arriving late affects you and other patients whose appointments are after yours.  Also, if you miss three or more appointments without notifying the office, you may be dismissed from the clinic at the provider’s discretion.    °  °For prescription refill requests, have your pharmacy contact our office and allow 72 hours for refills to be completed.   ° °Today you received the following chemotherapy and/or immunotherapy agents :  Durvalumab °    °  °To help prevent nausea and vomiting after your treatment, we encourage you to take your nausea medication as directed. ° °BELOW ARE SYMPTOMS THAT SHOULD BE REPORTED IMMEDIATELY: °*FEVER GREATER THAN 100.4 F (38 °C) OR HIGHER °*CHILLS OR SWEATING °*NAUSEA AND VOMITING THAT IS NOT CONTROLLED WITH YOUR NAUSEA MEDICATION °*UNUSUAL SHORTNESS OF BREATH °*UNUSUAL BRUISING OR BLEEDING °*URINARY PROBLEMS (pain or burning when urinating, or frequent urination) °*BOWEL PROBLEMS (unusual diarrhea, constipation, pain near the anus) °TENDERNESS IN MOUTH AND THROAT WITH OR WITHOUT PRESENCE OF ULCERS (sore throat, sores in mouth, or a toothache) °UNUSUAL RASH, SWELLING OR PAIN  °UNUSUAL VAGINAL DISCHARGE OR ITCHING  ° °Items with * indicate a potential emergency and should be followed up as soon as possible or go to the Emergency Department if any problems should occur. ° °Please show the CHEMOTHERAPY ALERT CARD or IMMUNOTHERAPY ALERT CARD at  check-in to the Emergency Department and triage nurse. ° °Should you have questions after your visit or need to cancel or reschedule your appointment, please contact [facility]  Dept: [phone]  and follow the prompts.  Office hours are 8:00 a.m. to 4:30 p.m. Monday - Friday. Please note that voicemails left after 4:00 p.m. may not be returned until the following business day.  We are closed weekends and major holidays. You have access to a nurse at all times for urgent questions. Please call the main number to the clinic Dept: [phone] and follow the prompts. ° ° °For any non-urgent questions, you may also contact your provider using MyChart. We now offer e-Visits for anyone 18 and older to request care online for non-urgent symptoms. For details visit [email or web address]. °  °Also download the MyChart app! Go to the app store, search "MyChart", open the app, select [facility], and log in with your MyChart username and password. ° °Due to Covid, a mask is required upon entering the hospital/clinic. If you do not have a mask, one will be given to you upon arrival. For doctor visits, patients may have 1 support person aged 18 or older with them. For treatment visits, patients cannot have anyone with them due to current Covid guidelines and our immunocompromised population.  ° °

## 2021-08-17 NOTE — Patient Instructions (Addendum)
-  Dr. Julien Nordmann will not be here next time  you come in and my schedule is full but you are scheduled to see one of our NP's. I will review your scan before you see her and reach out to her so she is aware -Before we see you next time on 09/15/21, I would like you to have a repeat CT scan of the chest so we can check the progress of treatment. Try to have this done around 12/29 or so. They should call you from the imaging department.

## 2021-09-09 ENCOUNTER — Encounter (HOSPITAL_COMMUNITY): Payer: Self-pay

## 2021-09-09 ENCOUNTER — Other Ambulatory Visit: Payer: Self-pay

## 2021-09-09 ENCOUNTER — Ambulatory Visit (HOSPITAL_COMMUNITY)
Admission: RE | Admit: 2021-09-09 | Discharge: 2021-09-09 | Disposition: A | Discharge status: Home / Self Care | Payer: Medicare Other | Source: Ambulatory Visit | Attending: Physician Assistant | Admitting: Physician Assistant

## 2021-09-09 DIAGNOSIS — C3491 Malignant neoplasm of unspecified part of right bronchus or lung: Secondary | ICD-10-CM | POA: Diagnosis not present

## 2021-09-09 DIAGNOSIS — J939 Pneumothorax, unspecified: Secondary | ICD-10-CM | POA: Diagnosis not present

## 2021-09-09 DIAGNOSIS — C3490 Malignant neoplasm of unspecified part of unspecified bronchus or lung: Secondary | ICD-10-CM | POA: Diagnosis not present

## 2021-09-09 DIAGNOSIS — J90 Pleural effusion, not elsewhere classified: Secondary | ICD-10-CM | POA: Diagnosis not present

## 2021-09-09 MED ORDER — IOHEXOL 350 MG/ML SOLN
60.0000 mL | Freq: Once | INTRAVENOUS | Status: AC | PRN
  Administered 2021-09-09: 09:00:00 60 mL via INTRAVENOUS

## 2021-09-09 MED ORDER — SODIUM CHLORIDE (PF) 0.9 % IJ SOLN
INTRAMUSCULAR | Status: AC
  Filled 2021-09-09: qty 50

## 2021-09-15 ENCOUNTER — Inpatient Hospital Stay: Payer: Medicare Other

## 2021-09-15 ENCOUNTER — Inpatient Hospital Stay: Payer: Medicare Other | Attending: Internal Medicine

## 2021-09-15 ENCOUNTER — Ambulatory Visit: Payer: Medicare Other | Admitting: Adult Health

## 2021-09-15 ENCOUNTER — Inpatient Hospital Stay: Payer: Medicare Other | Admitting: Internal Medicine

## 2021-10-12 ENCOUNTER — Telehealth: Payer: Self-pay

## 2021-10-12 ENCOUNTER — Inpatient Hospital Stay: Payer: Medicare Other

## 2021-10-12 ENCOUNTER — Inpatient Hospital Stay: Payer: Medicare Other | Admitting: Internal Medicine

## 2021-10-12 NOTE — Telephone Encounter (Signed)
Called patient regarding missed appointments today 10/12/2021. Patient did not answer so I left a vm for her to call back.

## 2021-10-19 ENCOUNTER — Telehealth: Payer: Self-pay | Admitting: Internal Medicine

## 2021-10-19 NOTE — Telephone Encounter (Signed)
Sch per 2/6 inbasket, called pt and left msg

## 2021-10-21 NOTE — Progress Notes (Deleted)
[place] OFFICE PROGRESS NOTE  Lawerance Cruel, MD [place] [address]  DIAGNOSIS:  Stage IIIb (T3, N2, MX) non-small cell lung cancer,  squamous cell carcinoma. She presented with large right middle lobe lung mass in addition to subcarinal lymphadenopathy.  She was diagnosed in May 2022   PDL1: 95%  PRIOR THERAPY:  Concurrent chemoradiation with carboplatin for an AUC of 2 and paclitaxel 45 mg per metered squared. Last dose 04/20/21. Status post 7 cycles.   CURRENT THERAPY:  Consolidation immunotherapy with Imfinzi 1500 mg IV every 4 weeks.  First dose expected on 06/23/2021. Status post 3 cycles.   INTERVAL HISTORY: Dawn [name] 81 y.o. female returns to clinic today for follow-up visit accompanied by her son.  The patient was last seen in the clinic on 08/17/2021.  Appears that the patient got lost to follow-up for the last 2 months.  Patient does have some memory deficits at baseline.  Overall the patient is feeling fairly well today.  She tolerated her last cycle of immunotherapy with infancy well without any concerning adverse side effects.  She denies any fever, chills, or night sweats.  Her breathing is stable.  She denies any [employer] exertion.  Denies any cough or hemoptysis.  Appetite. She sometimes has sternal pain for which she is prescribed tramadol but she denies any chest discomfort today.  Denies any nausea, vomiting, diarrhea, or constipation.  Denies any visual changes she states she has a headache "now and then" but nothing out of the ordinary.  She had a restaging CT scan on 09/09/2021 after cycle #3 but did not have a follow-up visit.  The patient is here today for evaluation and to review her scan results prior to starting cycle # 4    MEDICAL HISTORY: Past Medical History:  Diagnosis Date   Anemia    Anxiety    Arthritis    Breast cancer of upper-outer quadrant of left female breast ([facility]) 08/18/2015   Broken wrist    when younger  x2   COPD (chronic obstructive pulmonary disease) ([facility])    Hospitalization March 2011   COVID-19 01/22/2021   Dementia ([facility])    Depression    Diabetes mellitus without complication ([facility])    type 2   Dyspnea    Full dentures    H/O vaginal delivery 1962   x1    High cholesterol    History of home oxygen therapy    Hypertension    Memory changes    sharp decline over the last 3 months, per son as of 01/22/21,    Pneumonia 01/22/2021   Radiation    11/18/15- 12/16/15 to her Left Breast    ALLERGIES:  is allergic to ferumoxytol, pneumococcal vaccines, and repaglinide.  MEDICATIONS:  Current Outpatient Medications  Medication Sig Dispense Refill   acetaminophen-codeine (TYLENOL #3) 300-30 MG tablet Take 1 tablet by mouth every 6 (six) hours as needed.     albuterol (VENTOLIN HFA) 108 (90 Base) MCG/ACT inhaler Inhale 2 puffs into the lungs every 2 (two) hours as needed for wheezing or shortness of breath. Please instruct in usage 1 each 2   aspirin EC 81 MG tablet Take 81 mg by mouth daily.     Cholecalciferol (VITAMIN D3) 125 MCG (5000 UT) CAPS 1 tablet     ferrous sulfate 325 (65 FE) MG tablet Take 325 mg by mouth 2 (two) times daily.     glipiZIDE (GLUCOTROL) 5 MG  tablet Take 5 mg by mouth every morning.     Lancets (ONETOUCH ULTRASOFT) lancets USE TO TEST BLOOD GLUCOSE ONCE A DAY     LANTUS 100 UNIT/ML injection Inject 5 Units into the skin at bedtime.     memantine (NAMENDA) 5 MG tablet Take by mouth.     metFORMIN (GLUCOPHAGE-XR) 500 MG 24 hr tablet 1 tablet with evening meal     nitroGLYCERIN (NITROSTAT) 0.4 MG SL tablet Place 0.4 mg under the tongue every 15 (fifteen) minutes as needed for chest pain.     pantoprazole (PROTONIX) 40 MG tablet Take 1 tablet (40 mg total) by mouth daily before breakfast. 30 tablet 0   pentoxifylline (TRENTAL) 400 MG CR tablet 1 tablet with meals     pioglitazone (ACTOS) 15 MG tablet Take 1 tablet (15 mg total) by mouth daily. 30 tablet 1    prochlorperazine (COMPAZINE) 10 MG tablet Take 1 tablet (10 mg total) by mouth every 6 (six) hours as needed. 30 tablet 2   rosuvastatin (CRESTOR) 40 MG tablet Take 1 tablet (40 mg total) by mouth daily. 30 tablet 0   Tiotropium Bromide-Olodaterol (STIOLTO RESPIMAT) 2.5-2.5 MCG/ACT AERS Inhale 2 puffs into the lungs daily. 4 g 0   traMADol (ULTRAM) 50 MG tablet 1 tablet as needed     TRUEPLUS INSULIN SYRINGE 31G X 5/16" 1 ML MISC INJECT AS DIRECTED ONCE A DAY     No current facility-administered medications for this visit.    SURGICAL HISTORY:  Past Surgical History:  Procedure Laterality Date   ABDOMINAL HYSTERECTOMY     APPENDECTOMY     BRONCHIAL BIOPSY  02/16/2021   Procedure: BRONCHIAL BIOPSIES;  Surgeon: Collene Gobble, MD;  Location: [facility];  Service: Pulmonary;;   BRONCHIAL BRUSHINGS  02/16/2021   Procedure: BRONCHIAL BRUSHINGS;  Surgeon: Collene Gobble, MD;  [facility];  Service: Pulmonary;;   BRONCHIAL NEEDLE ASPIRATION BIOPSY  02/16/2021   Procedure: BRONCHIAL NEEDLE ASPIRATION BIOPSIES;  Surgeon: Collene Gobble, MD;  [facility];  Service: Pulmonary;;   CHOLECYSTECTOMY     EYE SURGERY     cataracts   RADIOACTIVE SEED GUIDED PARTIAL MASTECTOMY WITH AXILLARY SENTINEL LYMPH NODE BIOPSY Left 09/17/2015   Procedure: RADIOACTIVE SEED LOCALIZATION LEFT BREAST LUMPECTOMY AND LEFT AXILLARY SENTINEL LYMPH NODE BIOPSY;  Surgeon: Excell Seltzer, MD;  Location: [facility];  Service: General;  Laterality: Left;   TONSILLECTOMY     VIDEO BRONCHOSCOPY WITH ENDOBRONCHIAL NAVIGATION Right 02/16/2021   Procedure: VIDEO BRONCHOSCOPY WITH ENDOBRONCHIAL NAVIGATION;  Surgeon: Collene Gobble, MD;  [facility];  Service: Pulmonary;  Laterality: Right;    REVIEW OF SYSTEMS:   Review of Systems  Constitutional: Negative for appetite change, chills, fatigue, fever and unexpected weight change.  HENT:   Negative for mouth sores, nosebleeds, sore throat and trouble  swallowing.   Eyes: Negative for eye problems and icterus.  Respiratory: Negative for cough, hemoptysis, shortness of breath and wheezing.   Cardiovascular: Negative for chest pain and leg swelling.  Gastrointestinal: Negative for abdominal pain, constipation, diarrhea, nausea and vomiting.  Genitourinary: Negative for bladder incontinence, difficulty urinating, dysuria, frequency and hematuria.   Musculoskeletal: Negative for back pain, gait problem, neck pain and neck stiffness.  Skin: Negative for itching and rash.  Neurological: Negative for dizziness, extremity weakness, gait problem, headaches, light-headedness and seizures.  Hematological: Negative for adenopathy. Does not bruise/bleed easily.  Psychiatric/Behavioral: Negative for confusion, depression and sleep disturbance. The patient is not nervous/anxious.  PHYSICAL EXAMINATION:  There were no vitals taken for this visit.  ECOG PERFORMANCE STATUS: {CHL ONC ECOG [id number]  Physical Exam  Constitutional: Oriented to person, place, and time and well-developed, well-nourished, and in no distress. No distress.  HENT:  Head: Normocephalic and atraumatic.  Mouth/Throat: Oropharynx is clear and moist. No oropharyngeal exudate.  Eyes: Conjunctivae are normal. Right eye exhibits no discharge. Left eye exhibits no discharge. No scleral icterus.  Neck: Normal range of motion. Neck supple.  Cardiovascular: Normal rate, regular rhythm, normal heart sounds and intact distal pulses.   Pulmonary/Chest: Effort normal and breath sounds normal. No respiratory distress. No wheezes. No rales.  Abdominal: Soft. Bowel sounds are normal. Exhibits no distension and no mass. There is no tenderness.  Musculoskeletal: Normal range of motion. Exhibits no edema.  Lymphadenopathy:    No cervical adenopathy.  Neurological: Alert and oriented to person, place, and time. Exhibits normal muscle tone. Gait normal. Coordination normal.  Skin: Skin is  warm and dry. No rash noted. Not diaphoretic. No erythema. No pallor.  Psychiatric: Mood, memory and judgment normal.  Vitals reviewed.  LABORATORY DATA: Lab Results  Component Value Date   WBC 4.4 08/17/2021   HGB 11.8 (L) 08/17/2021   HCT 38.6 08/17/2021   MCV 96.5 08/17/2021   PLT 267 08/17/2021      Chemistry      Component Value Date/Time   NA 138 08/17/2021 0929   NA 137 08/20/2015 1232   K 4.3 08/17/2021 0929   K 4.3 08/20/2015 1232   CL 104 08/17/2021 0929   CO2 24 08/17/2021 0929   CO2 25 08/20/2015 1232   BUN 41 (H) 08/17/2021 0929   BUN 10.4 08/20/2015 1232   CREATININE 1.01 (H) 08/17/2021 0929   CREATININE 1.1 08/20/2015 1232      Component Value Date/Time   CALCIUM 9.8 08/17/2021 0929   CALCIUM 10.0 08/20/2015 1232   ALKPHOS 119 08/17/2021 0929   ALKPHOS 142 08/20/2015 1232   AST 23 08/17/2021 0929   AST 18 08/20/2015 1232   ALT 14 08/17/2021 0929   ALT 11 08/20/2015 1232   BILITOT 0.4 08/17/2021 0929   BILITOT 0.32 08/20/2015 1232       RADIOGRAPHIC STUDIES:  No results found.   ASSESSMENT/PLAN:  This is a very pleasant 81 year old Caucasian female diagnosed with stage IIIb (T3, N2, Mx) non-small cell lung cancer, squamous cell carcinoma.  She presented with a large right middle lobe lung mass in addition to subcarinal lymphadenopathy.  She was diagnosed in May 2022.  PD-L1 testing was 95%    The patient is currently undergoing concurrent chemoradiation with carboplatin for an [facility] of 2 and paclitaxel 45 mg per metered square. She is status post 7 cycles.  Her last dose of treatment was on 04/20/2021.   She is currently undergoing consolidation immunotherapy with Imfinzi 1500 mg IV every 4 weeks. Status post 3 cycles.   The patient recently had a restaging CT scan performed.  Dr. Julien Nordmann personally and independently reviewed the scan and discussed the results with the patient today.  The scan showed ***.    Labs were reviewed. Recommend that she  proceed with cycle #4 today as scheduled.     The patient was advised to call immediately if she has any concerning symptoms in the interval. The patient voices understanding of current disease status and treatment options and is in agreement with the current care plan. All questions were answered. The patient knows to call the  clinic with any problems, questions or concerns. We can certainly see the patient much sooner if necessary         No orders of the defined types were placed in this encounter.    I spent {CHL ONC TIME VISIT - KHVFM:[id number]} counseling the patient face to face. The total time spent in the appointment was {CHL ONC TIME VISIT - RCVKF:[id number]}.  Dawn [name] L Xaiver Roskelley, PA-C 10/21/21

## 2021-10-23 ENCOUNTER — Inpatient Hospital Stay: Payer: Medicare Other

## 2021-10-23 ENCOUNTER — Ambulatory Visit: Payer: Medicare Other

## 2021-10-23 ENCOUNTER — Other Ambulatory Visit: Payer: Medicare Other

## 2021-10-23 ENCOUNTER — Telehealth: Payer: Self-pay | Admitting: Physician Assistant

## 2021-10-23 ENCOUNTER — Inpatient Hospital Stay: Payer: Medicare Other | Admitting: Physician Assistant

## 2021-10-23 ENCOUNTER — Ambulatory Visit: Payer: Medicare Other | Admitting: Physician Assistant

## 2021-10-23 NOTE — Telephone Encounter (Signed)
Sch per 2/10 inbaskt, left msg with son, calendar mailed

## 2021-10-26 ENCOUNTER — Telehealth: Payer: Self-pay | Admitting: Physician Assistant

## 2021-10-26 NOTE — Telephone Encounter (Signed)
Sch per 2/13 secure chat, left msg with son

## 2021-10-27 NOTE — Progress Notes (Unsigned)
[place] OFFICE PROGRESS NOTE  Lawerance Cruel, MD [place] [address]  DIAGNOSIS: Stage IIIb (T3, N2, MX) non-small cell lung cancer,  squamous cell carcinoma. She presented with large right middle lobe lung mass in addition to subcarinal lymphadenopathy.  She was diagnosed in May 2022   PDL1: 95%  PRIOR THERAPY:  Concurrent chemoradiation with carboplatin for an AUC of 2 and paclitaxel 45 mg per metered squared. Last dose 04/20/21. Status post 7 cycles.   CURRENT THERAPY: Consolidation immunotherapy with Imfinzi 1500 mg IV every 4 weeks.  First dose expected on 06/23/2021. Status post 2 cycles.   INTERVAL HISTORY: Dawn [name] 81 y.o. female returns to clinic today for follow-up visit accompanied by her son.  The patient was last seen in the clinic on 08/17/2021.  The patient was lost to follow-up since that time.  The patient has some baseline memory deficits.  Since she was last seen she is reportedly doing well.  She denies any fever, chills, night sweats, or unexplained weight loss.  She denies any changes with her breathing reports her baseline dyspnea on exertion.  She denies any cough or hemoptysis.  She reports she is eating well.  She occasionally has sternal discomfort for which she is prescribed tramadol.  She denies any nausea, vomiting, diarrhea, or constipation.  Denies any headache or visual changes.  The patient had a restaging CT scan but did not follow-up for her next appointment on 09/15/2021.  She has been rescheduled several times since then but has not needed to any of her appointments.  The patient is here today for evaluation, to review her scan results, and to discuss resuming treatment.    MEDICAL HISTORY: Past Medical History:  Diagnosis Date   Anemia    Anxiety    Arthritis    Breast cancer of upper-outer quadrant of left female breast ([facility]) 08/18/2015   Broken wrist    when younger x2   COPD (chronic obstructive pulmonary  disease) ([facility])    Hospitalization March 2011   COVID-19 01/22/2021   Dementia ([facility])    Depression    Diabetes mellitus without complication ([facility])    type 2   Dyspnea    Full dentures    H/O vaginal delivery 1962   x1    High cholesterol    History of home oxygen therapy    Hypertension    Memory changes    sharp decline over the last 3 months, per son as of 01/22/21,    Pneumonia 01/22/2021   Radiation    11/18/15- 12/16/15 to her Left Breast    ALLERGIES:  is allergic to ferumoxytol, pneumococcal vaccines, and repaglinide.  MEDICATIONS:  Current Outpatient Medications  Medication Sig Dispense Refill   acetaminophen-codeine (TYLENOL #3) 300-30 MG tablet Take 1 tablet by mouth every 6 (six) hours as needed.     albuterol (VENTOLIN HFA) 108 (90 Base) MCG/ACT inhaler Inhale 2 puffs into the lungs every 2 (two) hours as needed for wheezing or shortness of breath. Please instruct in usage 1 each 2   aspirin EC 81 MG tablet Take 81 mg by mouth daily.     Cholecalciferol (VITAMIN D3) 125 MCG (5000 UT) CAPS 1 tablet     ferrous sulfate 325 (65 FE) MG tablet Take 325 mg by mouth 2 (two) times daily.     glipiZIDE (GLUCOTROL) 5 MG tablet Take 5 mg by mouth every morning.     Lancets [facility]  ULTRASOFT) lancets USE TO TEST BLOOD GLUCOSE ONCE A DAY     LANTUS 100 UNIT/ML injection Inject 5 Units into the skin at bedtime.     memantine (NAMENDA) 5 MG tablet Take by mouth.     metFORMIN (GLUCOPHAGE-XR) 500 MG 24 hr tablet 1 tablet with evening meal     nitroGLYCERIN (NITROSTAT) 0.4 MG SL tablet Place 0.4 mg under the tongue every 15 (fifteen) minutes as needed for chest pain.     pantoprazole (PROTONIX) 40 MG tablet Take 1 tablet (40 mg total) by mouth daily before breakfast. 30 tablet 0   pentoxifylline (TRENTAL) 400 MG CR tablet 1 tablet with meals     pioglitazone (ACTOS) 15 MG tablet Take 1 tablet (15 mg total) by mouth daily. 30 tablet 1   prochlorperazine (COMPAZINE) 10 MG tablet Take 1  tablet (10 mg total) by mouth every 6 (six) hours as needed. 30 tablet 2   rosuvastatin (CRESTOR) 40 MG tablet Take 1 tablet (40 mg total) by mouth daily. 30 tablet 0   Tiotropium Bromide-Olodaterol (STIOLTO RESPIMAT) 2.5-2.5 MCG/ACT AERS Inhale 2 puffs into the lungs daily. 4 g 0   traMADol (ULTRAM) 50 MG tablet 1 tablet as needed     TRUEPLUS INSULIN SYRINGE 31G X 5/16" 1 ML MISC INJECT AS DIRECTED ONCE A DAY     No current facility-administered medications for this visit.    SURGICAL HISTORY:  Past Surgical History:  Procedure Laterality Date   ABDOMINAL HYSTERECTOMY     APPENDECTOMY     BRONCHIAL BIOPSY  02/16/2021   Procedure: BRONCHIAL BIOPSIES;  Surgeon: Collene Gobble, MD;  Location: [facility];  Service: Pulmonary;;   BRONCHIAL BRUSHINGS  02/16/2021   Procedure: BRONCHIAL BRUSHINGS;  Surgeon: Collene Gobble, MD;  [facility];  Service: Pulmonary;;   BRONCHIAL NEEDLE ASPIRATION BIOPSY  02/16/2021   Procedure: BRONCHIAL NEEDLE ASPIRATION BIOPSIES;  Surgeon: Collene Gobble, MD;  [facility];  Service: Pulmonary;;   CHOLECYSTECTOMY     EYE SURGERY     cataracts   RADIOACTIVE SEED GUIDED PARTIAL MASTECTOMY WITH AXILLARY SENTINEL LYMPH NODE BIOPSY Left 09/17/2015   Procedure: RADIOACTIVE SEED LOCALIZATION LEFT BREAST LUMPECTOMY AND LEFT AXILLARY SENTINEL LYMPH NODE BIOPSY;  Surgeon: Excell Seltzer, MD;  Location: [facility];  Service: General;  Laterality: Left;   TONSILLECTOMY     VIDEO BRONCHOSCOPY WITH ENDOBRONCHIAL NAVIGATION Right 02/16/2021   Procedure: VIDEO BRONCHOSCOPY WITH ENDOBRONCHIAL NAVIGATION;  Surgeon: Collene Gobble, MD;  [facility];  Service: Pulmonary;  Laterality: Right;    REVIEW OF SYSTEMS:   Review of Systems  Constitutional: Negative for appetite change, chills, fatigue, fever and unexpected weight change.  HENT:   Negative for mouth sores, nosebleeds, sore throat and trouble swallowing.   Eyes: Negative for eye problems and  icterus.  Respiratory: Negative for cough, hemoptysis, shortness of breath and wheezing.   Cardiovascular: Negative for chest pain and leg swelling.  Gastrointestinal: Negative for abdominal pain, constipation, diarrhea, nausea and vomiting.  Genitourinary: Negative for bladder incontinence, difficulty urinating, dysuria, frequency and hematuria.   Musculoskeletal: Negative for back pain, gait problem, neck pain and neck stiffness.  Skin: Negative for itching and rash.  Neurological: Negative for dizziness, extremity weakness, gait problem, headaches, light-headedness and seizures.  Hematological: Negative for adenopathy. Does not bruise/bleed easily.  Psychiatric/Behavioral: Negative for confusion, depression and sleep disturbance. The patient is not nervous/anxious.     PHYSICAL EXAMINATION:  There were no vitals taken for this  visit.  ECOG PERFORMANCE STATUS: {CHL ONC ECOG EL:[id number]}  Physical Exam  Constitutional: Oriented to person, place, and time and well-developed, well-nourished, and in no distress. No distress.  HENT:  Head: Normocephalic and atraumatic.  Mouth/Throat: Oropharynx is clear and moist. No oropharyngeal exudate.  Eyes: Conjunctivae are normal. Right eye exhibits no discharge. Left eye exhibits no discharge. No scleral icterus.  Neck: Normal range of motion. Neck supple.  Cardiovascular: Normal rate, regular rhythm, normal heart sounds and intact distal pulses.   Pulmonary/Chest: Effort normal and breath sounds normal. No respiratory distress. No wheezes. No rales.  Abdominal: Soft. Bowel sounds are normal. Exhibits no distension and no mass. There is no tenderness.  Musculoskeletal: Normal range of motion. Exhibits no edema.  Lymphadenopathy:    No cervical adenopathy.  Neurological: Alert and oriented to person, place, and time. Exhibits normal muscle tone. Gait normal. Coordination normal.  Skin: Skin is warm and dry. No rash noted. Not diaphoretic. No  erythema. No pallor.  Psychiatric: Mood, memory and judgment normal.  Vitals reviewed.  LABORATORY DATA: Lab Results  Component Value Date   WBC 4.4 08/17/2021   HGB 11.8 (L) 08/17/2021   HCT 38.6 08/17/2021   MCV 96.5 08/17/2021   PLT 267 08/17/2021      Chemistry      Component Value Date/Time   NA 138 08/17/2021 0929   NA 137 08/20/2015 1232   K 4.3 08/17/2021 0929   K 4.3 08/20/2015 1232   CL 104 08/17/2021 0929   CO2 24 08/17/2021 0929   CO2 25 08/20/2015 1232   BUN 41 (H) 08/17/2021 0929   BUN 10.4 08/20/2015 1232   CREATININE 1.01 (H) 08/17/2021 0929   CREATININE 1.1 08/20/2015 1232      Component Value Date/Time   CALCIUM 9.8 08/17/2021 0929   CALCIUM 10.0 08/20/2015 1232   ALKPHOS 119 08/17/2021 0929   ALKPHOS 142 08/20/2015 1232   AST 23 08/17/2021 0929   AST 18 08/20/2015 1232   ALT 14 08/17/2021 0929   ALT 11 08/20/2015 1232   BILITOT 0.4 08/17/2021 0929   BILITOT 0.32 08/20/2015 1232       RADIOGRAPHIC STUDIES:  No results found.   ASSESSMENT/PLAN:  This is a very pleasant 81 year old Caucasian female diagnosed with stage IIIb (T3, N2, Mx) non-small cell lung cancer, squamous cell carcinoma.  She presented with a large right middle lobe lung mass in addition to subcarinal lymphadenopathy.  She was diagnosed in May 2022.  PD-L1 testing was 95%    The patient is currently undergoing concurrent chemoradiation with carboplatin for an [facility] of 2 and paclitaxel 45 mg per metered square. She is status post 7 cycles.  Her last dose of treatment was on 04/20/2021.   She is currently undergoing consolidation immunotherapy with Imfinzi 1500 mg IV every 4 weeks. Status post 2 cycles.  She was lost to follow-up following cycle #2 on 08/17/2021   The patient recently had a restaging CT scan in early January 2022.  The patient was seen with Dr. Julien Nordmann.  Dr. Julien Nordmann personally and independently reviewed the scan discussed results with patient today.  The scan showed  ***.  Dr. Julien Nordmann recommends ***.   We will arrange for the patient to undergo cycle #3 today as scheduled.  We will see her back for follow-up visit in 4 weeks for evaluation before starting cycle #4.  The patient was advised to call immediately if she has any concerning symptoms in the interval. The patient  voices understanding of current disease status and treatment options and is in agreement with the current care plan. All questions were answered. The patient knows to call the clinic with any problems, questions or concerns. We can certainly see the patient much sooner if necessary         No orders of the defined types were placed in this encounter.    I spent {CHL ONC TIME VISIT - SVXBL:[id number]} counseling the patient face to face. The total time spent in the appointment was {CHL ONC TIME VISIT - AQTMA:[id number]}.  Aluna Whiston L Kaitlynd Phillips, PA-C 10/27/21

## 2021-10-29 ENCOUNTER — Inpatient Hospital Stay: Payer: Medicare Other

## 2021-10-29 ENCOUNTER — Inpatient Hospital Stay: Payer: Medicare Other | Admitting: Physician Assistant

## 2021-10-29 ENCOUNTER — Telehealth: Payer: Self-pay

## 2021-10-29 NOTE — Telephone Encounter (Signed)
Called patient regarding missed appointment today 10/29/21. No answer, so I left a vm for her to call back and reschedule.

## 2021-10-30 ENCOUNTER — Ambulatory Visit: Payer: Medicare Other

## 2021-10-30 ENCOUNTER — Ambulatory Visit: Payer: Medicare Other | Admitting: Physician Assistant

## 2021-10-30 ENCOUNTER — Other Ambulatory Visit: Payer: Medicare Other

## 2021-11-03 NOTE — Progress Notes (Unsigned)
Dawn [name] OFFICE PROGRESS NOTE  Lawerance Cruel, MD [place] [address]  DIAGNOSIS: Stage IIIb (T3, N2, MX) non-small cell lung cancer,  squamous cell carcinoma. She presented with large right middle lobe lung mass in addition to subcarinal lymphadenopathy.  She was diagnosed in May 2022   PDL1: 95%  PRIOR THERAPY: Concurrent chemoradiation with carboplatin for an AUC of 2 and paclitaxel 45 mg per metered squared. Last dose 04/20/21. Status post 7 cycles.   CURRENT THERAPY: Consolidation immunotherapy with Imfinzi 1500 mg IV every 4 weeks.  First dose expected on 06/23/2021. Status post 3 cycles.   INTERVAL HISTORY: Dawn [name] 81 y.o. female returns to clinic today for follow-up visit accompanied by her son.  The patient has some cognitive defects/dementia at baseline.  The patient was last seen on the clinic at 10/18/2020.  The patient has been lost to follow-up since that time and has not shown up to numerous appointments.  She did get her restaging CT scan at the end of December 2022 as scheduled but missed her follow-up a few days later.  The patient was undergoing treatment with immunotherapy with Imfinzi.  She is tolerating treatment well without any concerning adverse side effects.  She denies any recent fever, chills, or night sweats.  Her breathing is stable and she denies any dyspnea.  Denies any cough or hemoptysis.  Denies any chest discomfort.  Appetite and weight?  She sometimes gets sternal pain for which she is prescribed tramadol.  She denies any chest discomfort today.  Denies any nausea, vomiting, diarrhea, or constipation.  Denies any headache or visual changes.  Denies any headache or visual changes.  Denies any rashes or skin changes.  The patient recently had a restaging CT scan at the end of December.  She is here today for evaluation, to review her scan results, and consideration of starting cycle #4. MEDICAL HISTORY: Past Medical  History:  Diagnosis Date   Anemia    Anxiety    Arthritis    Breast cancer of upper-outer quadrant of left female breast ([place]) 08/18/2015   Broken wrist    when younger x2   COPD (chronic obstructive pulmonary disease) ([facility])    Hospitalization March 2011   COVID-19 01/22/2021   Dementia ([place])    Depression    Diabetes mellitus without complication ([place])    type 2   Dyspnea    Full dentures    H/O vaginal delivery 1962   x1    High cholesterol    History of home oxygen therapy    Hypertension    Memory changes    sharp decline over the last 3 months, per son as of 01/22/21,    Pneumonia 01/22/2021   Radiation    11/18/15- 12/16/15 to her Left Breast    ALLERGIES:  is allergic to ferumoxytol, pneumococcal vaccines, and repaglinide.  MEDICATIONS:  Current Outpatient Medications  Medication Sig Dispense Refill   acetaminophen-codeine (TYLENOL #3) 300-30 MG tablet Take 1 tablet by mouth every 6 (six) hours as needed.     albuterol (VENTOLIN HFA) 108 (90 Base) MCG/ACT inhaler Inhale 2 puffs into the lungs every 2 (two) hours as needed for wheezing or shortness of breath. Please instruct in usage 1 each 2   aspirin EC 81 MG tablet Take 81 mg by mouth daily.     Cholecalciferol (VITAMIN D3) 125 MCG (5000 UT) CAPS 1 tablet     ferrous sulfate 325 (65  FE) MG tablet Take 325 mg by mouth 2 (two) times daily.     glipiZIDE (GLUCOTROL) 5 MG tablet Take 5 mg by mouth every morning.     Lancets (ONETOUCH ULTRASOFT) lancets USE TO TEST BLOOD GLUCOSE ONCE A DAY     LANTUS 100 UNIT/ML injection Inject 5 Units into the skin at bedtime.     memantine (NAMENDA) 5 MG tablet Take by mouth.     metFORMIN (GLUCOPHAGE-XR) 500 MG 24 hr tablet 1 tablet with evening meal     nitroGLYCERIN (NITROSTAT) 0.4 MG SL tablet Place 0.4 mg under the tongue every 15 (fifteen) minutes as needed for chest pain.     pantoprazole (PROTONIX) 40 MG tablet Take 1 tablet (40 mg total) by mouth daily before breakfast. 30  tablet 0   pentoxifylline (TRENTAL) 400 MG CR tablet 1 tablet with meals     pioglitazone (ACTOS) 15 MG tablet Take 1 tablet (15 mg total) by mouth daily. 30 tablet 1   prochlorperazine (COMPAZINE) 10 MG tablet Take 1 tablet (10 mg total) by mouth every 6 (six) hours as needed. 30 tablet 2   rosuvastatin (CRESTOR) 40 MG tablet Take 1 tablet (40 mg total) by mouth daily. 30 tablet 0   Tiotropium Bromide-Olodaterol (STIOLTO RESPIMAT) 2.5-2.5 MCG/ACT AERS Inhale 2 puffs into the lungs daily. 4 g 0   traMADol (ULTRAM) 50 MG tablet 1 tablet as needed     TRUEPLUS INSULIN SYRINGE 31G X 5/16" 1 ML MISC INJECT AS DIRECTED ONCE A DAY     No current facility-administered medications for this visit.    SURGICAL HISTORY:  Past Surgical History:  Procedure Laterality Date   ABDOMINAL HYSTERECTOMY     APPENDECTOMY     BRONCHIAL BIOPSY  02/16/2021   Procedure: BRONCHIAL BIOPSIES;  Surgeon: Collene Gobble, MD;  Location: [facility];  Service: Pulmonary;;   BRONCHIAL BRUSHINGS  02/16/2021   Procedure: BRONCHIAL BRUSHINGS;  Surgeon: Collene Gobble, MD;  Location: [facility];  Service: Pulmonary;;   BRONCHIAL NEEDLE ASPIRATION BIOPSY  02/16/2021   Procedure: BRONCHIAL NEEDLE ASPIRATION BIOPSIES;  Surgeon: Collene Gobble, MD;  Location: [facility];  Service: Pulmonary;;   CHOLECYSTECTOMY     EYE SURGERY     cataracts   RADIOACTIVE SEED GUIDED PARTIAL MASTECTOMY WITH AXILLARY SENTINEL LYMPH NODE BIOPSY Left 09/17/2015   Procedure: RADIOACTIVE SEED LOCALIZATION LEFT BREAST LUMPECTOMY AND LEFT AXILLARY SENTINEL LYMPH NODE BIOPSY;  Surgeon: Excell Seltzer, MD;  Location: [facility];  Service: General;  Laterality: Left;   TONSILLECTOMY     VIDEO BRONCHOSCOPY WITH ENDOBRONCHIAL NAVIGATION Right 02/16/2021   Procedure: VIDEO BRONCHOSCOPY WITH ENDOBRONCHIAL NAVIGATION;  Surgeon: Collene Gobble, MD;  Location: [facility];  Service: Pulmonary;  Laterality: Right;    REVIEW OF SYSTEMS:   Review of Systems   Constitutional: Negative for appetite change, chills, fatigue, fever and unexpected weight change.  HENT:   Negative for mouth sores, nosebleeds, sore throat and trouble swallowing.   Eyes: Negative for eye problems and icterus.  Respiratory: Negative for cough, hemoptysis, shortness of breath and wheezing.   Cardiovascular: Negative for chest pain and leg swelling.  Gastrointestinal: Negative for abdominal pain, constipation, diarrhea, nausea and vomiting.  Genitourinary: Negative for bladder incontinence, difficulty urinating, dysuria, frequency and hematuria.   Musculoskeletal: Negative for back pain, gait problem, neck pain and neck stiffness.  Skin: Negative for itching and rash.  Neurological: Negative for dizziness, extremity weakness, gait problem, headaches, light-headedness and seizures.  Hematological: Negative  for adenopathy. Does not bruise/bleed easily.  Psychiatric/Behavioral: Negative for confusion, depression and sleep disturbance. The patient is not nervous/anxious.     PHYSICAL EXAMINATION:  There were no vitals taken for this visit.  ECOG PERFORMANCE STATUS: {CHL ONC ECOG [id number]  Physical Exam  Constitutional: Oriented to person, place, and time and well-developed, well-nourished, and in no distress. No distress.  HENT:  Head: Normocephalic and atraumatic.  Mouth/Throat: Oropharynx is clear and moist. No oropharyngeal exudate.  Eyes: Conjunctivae are normal. Right eye exhibits no discharge. Left eye exhibits no discharge. No scleral icterus.  Neck: Normal range of motion. Neck supple.  Cardiovascular: Normal rate, regular rhythm, normal heart sounds and intact distal pulses.   Pulmonary/Chest: Effort normal and breath sounds normal. No respiratory distress. No wheezes. No rales.  Abdominal: Soft. Bowel sounds are normal. Exhibits no distension and no mass. There is no tenderness.  Musculoskeletal: Normal range of motion. Exhibits no edema.  Lymphadenopathy:     No cervical adenopathy.  Neurological: Alert and oriented to person, place, and time. Exhibits normal muscle tone. Gait normal. Coordination normal.  Skin: Skin is warm and dry. No rash noted. Not diaphoretic. No erythema. No pallor.  Psychiatric: Mood, memory and judgment normal.  Vitals reviewed.  LABORATORY DATA: Lab Results  Component Value Date   WBC 4.4 08/17/2021   HGB 11.8 (L) 08/17/2021   HCT 38.6 08/17/2021   MCV 96.5 08/17/2021   PLT 267 08/17/2021      Chemistry      Component Value Date/Time   NA 138 08/17/2021 0929   NA 137 08/20/2015 1232   K 4.3 08/17/2021 0929   K 4.3 08/20/2015 1232   CL 104 08/17/2021 0929   CO2 24 08/17/2021 0929   CO2 25 08/20/2015 1232   BUN 41 (H) 08/17/2021 0929   BUN 10.4 08/20/2015 1232   CREATININE 1.01 (H) 08/17/2021 0929   CREATININE 1.1 08/20/2015 1232      Component Value Date/Time   CALCIUM 9.8 08/17/2021 0929   CALCIUM 10.0 08/20/2015 1232   ALKPHOS 119 08/17/2021 0929   ALKPHOS 142 08/20/2015 1232   AST 23 08/17/2021 0929   AST 18 08/20/2015 1232   ALT 14 08/17/2021 0929   ALT 11 08/20/2015 1232   BILITOT 0.4 08/17/2021 0929   BILITOT 0.32 08/20/2015 1232       RADIOGRAPHIC STUDIES:  No results found.   ASSESSMENT/PLAN:  This is a very pleasant 81 year old Caucasian female diagnosed with stage IIIb (T3, N2, Mx) non-small cell lung cancer, squamous cell carcinoma.  She presented with a large right middle lobe lung mass in addition to subcarinal lymphadenopathy.  She was diagnosed in May 2022.  PD-L1 testing was 95%    The patient is currently undergoing concurrent chemoradiation with carboplatin for an [facility] of 2 and paclitaxel 45 mg per metered square. She is status post 7 cycles.  Her last dose of treatment was on 04/20/2021.   She is currently undergoing consolidation immunotherapy with Imfinzi 1500 mg IV every 4 weeks. Status post 3 cycles.    The patient recently had a restaging CT scan performed.  Dr.  Julien Nordmann personally independently reviewed the scan discussed results with the patient today.  The scan showed ***Dr. Julien Nordmann recommends that she ***with cycle #4 today scheduled.  We will see her back for follow-up visit in 4 weeks for evaluation before starting cycle #5.  The patient was advised to call immediately if she has any concerning symptoms in the interval.  The patient voices understanding of current disease status and treatment options and is in agreement with the current care plan. All questions were answered. The patient knows to call the clinic with any problems, questions or concerns. We can certainly see the patient much sooner if necessary       No orders of the defined types were placed in this encounter.    I spent {CHL ONC TIME VISIT - ZNBVA:[id number]} counseling the patient face to face. The total time spent in the appointment was {CHL ONC TIME VISIT - HYHOO:[id number]}.  Dawn Maris L Issacc Merlo, PA-C 11/03/21

## 2021-11-05 ENCOUNTER — Inpatient Hospital Stay: Payer: Medicare Other | Admitting: Physician Assistant

## 2021-11-05 ENCOUNTER — Telehealth: Payer: Self-pay

## 2021-11-05 ENCOUNTER — Inpatient Hospital Stay: Payer: Medicare Other | Attending: Physician Assistant

## 2021-11-05 NOTE — Telephone Encounter (Signed)
Called and left VM for patient's son advising that pt did not come for apt today. She has had multiple missed appointments. Requested call back to let [facility] know status of pt and if she will continue with her care by Dr. Julien Nordmann.

## 2021-11-06 ENCOUNTER — Inpatient Hospital Stay: Payer: Medicare Other

## 2021-11-24 ENCOUNTER — Encounter: Payer: Self-pay | Admitting: Physician Assistant

## 2021-12-08 DIAGNOSIS — D510 Vitamin B12 deficiency anemia due to intrinsic factor deficiency: Secondary | ICD-10-CM | POA: Diagnosis not present

## 2021-12-08 DIAGNOSIS — C3490 Malignant neoplasm of unspecified part of unspecified bronchus or lung: Secondary | ICD-10-CM | POA: Diagnosis not present

## 2021-12-08 DIAGNOSIS — E1165 Type 2 diabetes mellitus with hyperglycemia: Secondary | ICD-10-CM | POA: Diagnosis not present

## 2021-12-08 DIAGNOSIS — E538 Deficiency of other specified B group vitamins: Secondary | ICD-10-CM | POA: Diagnosis not present

## 2022-03-02 ENCOUNTER — Other Ambulatory Visit: Payer: Self-pay

## 2022-03-02 ENCOUNTER — Inpatient Hospital Stay (HOSPITAL_COMMUNITY)
Admission: EM | Admit: 2022-03-02 | Discharge: 2022-03-09 | DRG: 181 | Disposition: A | Discharge status: Skilled Nursing Facility | Payer: Medicare Other | Attending: Internal Medicine | Admitting: Internal Medicine

## 2022-03-02 ENCOUNTER — Emergency Department (HOSPITAL_COMMUNITY): Payer: Medicare Other

## 2022-03-02 DIAGNOSIS — Z8616 Personal history of COVID-19: Secondary | ICD-10-CM | POA: Diagnosis not present

## 2022-03-02 DIAGNOSIS — Z515 Encounter for palliative care: Secondary | ICD-10-CM | POA: Diagnosis not present

## 2022-03-02 DIAGNOSIS — Z66 Do not resuscitate: Secondary | ICD-10-CM | POA: Diagnosis not present

## 2022-03-02 DIAGNOSIS — G893 Neoplasm related pain (acute) (chronic): Secondary | ICD-10-CM | POA: Diagnosis not present

## 2022-03-02 DIAGNOSIS — R0789 Other chest pain: Secondary | ICD-10-CM | POA: Diagnosis not present

## 2022-03-02 DIAGNOSIS — E119 Type 2 diabetes mellitus without complications: Secondary | ICD-10-CM

## 2022-03-02 DIAGNOSIS — C7951 Secondary malignant neoplasm of bone: Secondary | ICD-10-CM | POA: Diagnosis present

## 2022-03-02 DIAGNOSIS — R531 Weakness: Secondary | ICD-10-CM | POA: Diagnosis not present

## 2022-03-02 DIAGNOSIS — J8410 Pulmonary fibrosis, unspecified: Secondary | ICD-10-CM | POA: Diagnosis not present

## 2022-03-02 DIAGNOSIS — R071 Chest pain on breathing: Secondary | ICD-10-CM

## 2022-03-02 DIAGNOSIS — M8448XD Pathological fracture, other site, subsequent encounter for fracture with routine healing: Secondary | ICD-10-CM | POA: Diagnosis not present

## 2022-03-02 DIAGNOSIS — Z923 Personal history of irradiation: Secondary | ICD-10-CM | POA: Diagnosis not present

## 2022-03-02 DIAGNOSIS — C3491 Malignant neoplasm of unspecified part of right bronchus or lung: Secondary | ICD-10-CM | POA: Diagnosis not present

## 2022-03-02 DIAGNOSIS — Z87891 Personal history of nicotine dependence: Secondary | ICD-10-CM

## 2022-03-02 DIAGNOSIS — E7800 Pure hypercholesterolemia, unspecified: Secondary | ICD-10-CM | POA: Diagnosis present

## 2022-03-02 DIAGNOSIS — J432 Centrilobular emphysema: Secondary | ICD-10-CM | POA: Diagnosis present

## 2022-03-02 DIAGNOSIS — F03C3 Unspecified dementia, severe, with mood disturbance: Secondary | ICD-10-CM | POA: Diagnosis not present

## 2022-03-02 DIAGNOSIS — Z91199 Patient's noncompliance with other medical treatment and regimen due to unspecified reason: Secondary | ICD-10-CM

## 2022-03-02 DIAGNOSIS — I499 Cardiac arrhythmia, unspecified: Secondary | ICD-10-CM | POA: Diagnosis not present

## 2022-03-02 DIAGNOSIS — Z7982 Long term (current) use of aspirin: Secondary | ICD-10-CM

## 2022-03-02 DIAGNOSIS — R0689 Other abnormalities of breathing: Secondary | ICD-10-CM | POA: Diagnosis not present

## 2022-03-02 DIAGNOSIS — R197 Diarrhea, unspecified: Secondary | ICD-10-CM | POA: Diagnosis not present

## 2022-03-02 DIAGNOSIS — Z8701 Personal history of pneumonia (recurrent): Secondary | ICD-10-CM | POA: Diagnosis not present

## 2022-03-02 DIAGNOSIS — I10 Essential (primary) hypertension: Secondary | ICD-10-CM | POA: Diagnosis not present

## 2022-03-02 DIAGNOSIS — E1165 Type 2 diabetes mellitus with hyperglycemia: Secondary | ICD-10-CM | POA: Diagnosis present

## 2022-03-02 DIAGNOSIS — Z887 Allergy status to serum and vaccine status: Secondary | ICD-10-CM

## 2022-03-02 DIAGNOSIS — Z801 Family history of malignant neoplasm of trachea, bronchus and lung: Secondary | ICD-10-CM

## 2022-03-02 DIAGNOSIS — Z635 Disruption of family by separation and divorce: Secondary | ICD-10-CM

## 2022-03-02 DIAGNOSIS — S22069A Unspecified fracture of T7-T8 vertebra, initial encounter for closed fracture: Secondary | ICD-10-CM

## 2022-03-02 DIAGNOSIS — Z743 Need for continuous supervision: Secondary | ICD-10-CM | POA: Diagnosis not present

## 2022-03-02 DIAGNOSIS — J90 Pleural effusion, not elsewhere classified: Secondary | ICD-10-CM | POA: Diagnosis not present

## 2022-03-02 DIAGNOSIS — K219 Gastro-esophageal reflux disease without esophagitis: Secondary | ICD-10-CM | POA: Diagnosis not present

## 2022-03-02 DIAGNOSIS — F03C4 Unspecified dementia, severe, with anxiety: Secondary | ICD-10-CM | POA: Diagnosis present

## 2022-03-02 DIAGNOSIS — R2681 Unsteadiness on feet: Secondary | ICD-10-CM | POA: Diagnosis not present

## 2022-03-02 DIAGNOSIS — J439 Emphysema, unspecified: Secondary | ICD-10-CM | POA: Diagnosis not present

## 2022-03-02 DIAGNOSIS — Z7189 Other specified counseling: Secondary | ICD-10-CM | POA: Diagnosis not present

## 2022-03-02 DIAGNOSIS — Z853 Personal history of malignant neoplasm of breast: Secondary | ICD-10-CM

## 2022-03-02 DIAGNOSIS — R0602 Shortness of breath: Secondary | ICD-10-CM | POA: Diagnosis not present

## 2022-03-02 DIAGNOSIS — R6889 Other general symptoms and signs: Secondary | ICD-10-CM | POA: Diagnosis not present

## 2022-03-02 DIAGNOSIS — R079 Chest pain, unspecified: Secondary | ICD-10-CM

## 2022-03-02 DIAGNOSIS — Z90710 Acquired absence of both cervix and uterus: Secondary | ICD-10-CM

## 2022-03-02 DIAGNOSIS — M6281 Muscle weakness (generalized): Secondary | ICD-10-CM | POA: Diagnosis not present

## 2022-03-02 DIAGNOSIS — N6320 Unspecified lump in the left breast, unspecified quadrant: Secondary | ICD-10-CM | POA: Diagnosis not present

## 2022-03-02 DIAGNOSIS — Z20822 Contact with and (suspected) exposure to covid-19: Secondary | ICD-10-CM | POA: Diagnosis not present

## 2022-03-02 DIAGNOSIS — Z7401 Bed confinement status: Secondary | ICD-10-CM | POA: Diagnosis not present

## 2022-03-02 DIAGNOSIS — Z7984 Long term (current) use of oral hypoglycemic drugs: Secondary | ICD-10-CM

## 2022-03-02 DIAGNOSIS — M1990 Unspecified osteoarthritis, unspecified site: Secondary | ICD-10-CM | POA: Diagnosis present

## 2022-03-02 DIAGNOSIS — Z79899 Other long term (current) drug therapy: Secondary | ICD-10-CM

## 2022-03-02 DIAGNOSIS — M8458XA Pathological fracture in neoplastic disease, other specified site, initial encounter for fracture: Secondary | ICD-10-CM | POA: Diagnosis not present

## 2022-03-02 DIAGNOSIS — C3490 Malignant neoplasm of unspecified part of unspecified bronchus or lung: Secondary | ICD-10-CM | POA: Diagnosis not present

## 2022-03-02 DIAGNOSIS — C782 Secondary malignant neoplasm of pleura: Secondary | ICD-10-CM | POA: Diagnosis not present

## 2022-03-02 DIAGNOSIS — Z888 Allergy status to other drugs, medicaments and biological substances status: Secondary | ICD-10-CM

## 2022-03-02 DIAGNOSIS — Z794 Long term (current) use of insulin: Secondary | ICD-10-CM

## 2022-03-02 DIAGNOSIS — R1312 Dysphagia, oropharyngeal phase: Secondary | ICD-10-CM | POA: Diagnosis not present

## 2022-03-02 DIAGNOSIS — Z510 Encounter for antineoplastic radiation therapy: Secondary | ICD-10-CM | POA: Diagnosis not present

## 2022-03-02 LAB — TROPONIN I (HIGH SENSITIVITY): Troponin I (High Sensitivity): 11 ng/L (ref <18)

## 2022-03-02 LAB — I-STAT VENOUS BLOOD GAS, ED
Acid-base deficit: 1 mmol/L (ref 0.0–2.0)
Bicarbonate: 24.1 mmol/L (ref 20.0–28.0)
Calcium, Ion: 1.1 mmol/L — ABNORMAL LOW (ref 1.15–1.40)
HCT: 40 % (ref 36.0–46.0)
Hemoglobin: 13.6 g/dL (ref 12.0–15.0)
O2 Saturation: 62 %
Potassium: 4.3 mmol/L (ref 3.5–5.1)
Sodium: 137 mmol/L (ref 135–145)
TCO2: 25 mmol/L (ref 22–32)
pCO2, Ven: 41.3 mmHg — ABNORMAL LOW (ref 44–60)
pH, Ven: 7.374 (ref 7.25–7.43)
pO2, Ven: 33 mmHg (ref 32–45)

## 2022-03-02 LAB — COMPREHENSIVE METABOLIC PANEL
ALT: 11 U/L (ref 0–44)
AST: 21 U/L (ref 15–41)
Albumin: 3.6 g/dL (ref 3.5–5.0)
Alkaline Phosphatase: 110 U/L (ref 38–126)
Anion gap: 15 (ref 5–15)
BUN: 26 mg/dL — ABNORMAL HIGH (ref 8–23)
CO2: 20 mmol/L — ABNORMAL LOW (ref 22–32)
Calcium: 9.6 mg/dL (ref 8.9–10.3)
Chloride: 102 mmol/L (ref 98–111)
Creatinine, Ser: 1.11 mg/dL — ABNORMAL HIGH (ref 0.44–1.00)
GFR, Estimated: 51 mL/min — ABNORMAL LOW (ref >60)
Glucose, Bld: 334 mg/dL — ABNORMAL HIGH (ref 70–99)
Potassium: 4.3 mmol/L (ref 3.5–5.1)
Sodium: 137 mmol/L (ref 135–145)
Total Bilirubin: 0.8 mg/dL (ref 0.3–1.2)
Total Protein: 6.8 g/dL (ref 6.5–8.1)

## 2022-03-02 LAB — CBC WITH DIFFERENTIAL/PLATELET
Abs Immature Granulocytes: 0.03 10*3/uL (ref 0.00–0.07)
Basophils Absolute: 0 10*3/uL (ref 0.0–0.1)
Basophils Relative: 0 %
Eosinophils Absolute: 0 10*3/uL (ref 0.0–0.5)
Eosinophils Relative: 0 %
HCT: 41 % (ref 36.0–46.0)
Hemoglobin: 12.8 g/dL (ref 12.0–15.0)
Immature Granulocytes: 1 %
Lymphocytes Relative: 13 %
Lymphs Abs: 0.6 10*3/uL — ABNORMAL LOW (ref 0.7–4.0)
MCH: 30.2 pg (ref 26.0–34.0)
MCHC: 31.2 g/dL (ref 30.0–36.0)
MCV: 96.7 fL (ref 80.0–100.0)
Monocytes Absolute: 0.1 10*3/uL (ref 0.1–1.0)
Monocytes Relative: 3 %
Neutro Abs: 3.9 10*3/uL (ref 1.7–7.7)
Neutrophils Relative %: 83 %
Platelets: 218 10*3/uL (ref 150–400)
RBC: 4.24 MIL/uL (ref 3.87–5.11)
RDW: 14 % (ref 11.5–15.5)
WBC: 4.7 10*3/uL (ref 4.0–10.5)
nRBC: 0 % (ref 0.0–0.2)

## 2022-03-02 LAB — RESP PANEL BY RT-PCR (FLU A&B, COVID) ARPGX2
Influenza A by PCR: NEGATIVE
Influenza B by PCR: NEGATIVE
SARS Coronavirus 2 by RT PCR: NEGATIVE

## 2022-03-02 LAB — GLUCOSE, CAPILLARY
Glucose-Capillary: 350 mg/dL — ABNORMAL HIGH (ref 70–99)
Glucose-Capillary: 289 mg/dL — ABNORMAL HIGH (ref 70–99)

## 2022-03-02 LAB — CBG MONITORING, ED: Glucose-Capillary: 370 mg/dL — ABNORMAL HIGH (ref 70–99)

## 2022-03-02 LAB — BRAIN NATRIURETIC PEPTIDE: B Natriuretic Peptide: 96.2 pg/mL (ref 0.0–100.0)

## 2022-03-02 MED ORDER — LIDOCAINE 5 % EX PTCH
1.0000 | MEDICATED_PATCH | CUTANEOUS | Status: DC
  Administered 2022-03-02 – 2022-03-05 (×4): 1 via TRANSDERMAL
  Filled 2022-03-02 (×4): qty 1

## 2022-03-02 MED ORDER — DEXAMETHASONE SODIUM PHOSPHATE 4 MG/ML IJ SOLN
4.0000 mg | Freq: Two times a day (BID) | INTRAMUSCULAR | Status: DC
  Administered 2022-03-02 – 2022-03-07 (×11): 4 mg via INTRAVENOUS
  Filled 2022-03-02 (×11): qty 1

## 2022-03-02 MED ORDER — ROSUVASTATIN CALCIUM 20 MG PO TABS
40.0000 mg | ORAL_TABLET | Freq: Every day | ORAL | Status: DC
  Administered 2022-03-02 – 2022-03-09 (×8): 40 mg via ORAL
  Filled 2022-03-02 (×8): qty 2

## 2022-03-02 MED ORDER — PREDNISONE 20 MG PO TABS: 40.0000 mg | ORAL_TABLET | Freq: Every day | ORAL | Status: DC

## 2022-03-02 MED ORDER — IOHEXOL 350 MG/ML SOLN
100.0000 mL | Freq: Once | INTRAVENOUS | Status: AC | PRN
  Administered 2022-03-02: 100 mL via INTRAVENOUS

## 2022-03-02 MED ORDER — UMECLIDINIUM BROMIDE 62.5 MCG/ACT IN AEPB
1.0000 | INHALATION_SPRAY | Freq: Every day | RESPIRATORY_TRACT | Status: DC
  Administered 2022-03-04 – 2022-03-08 (×5): 1 via RESPIRATORY_TRACT
  Filled 2022-03-02 (×3): qty 7

## 2022-03-02 MED ORDER — IPRATROPIUM-ALBUTEROL 0.5-2.5 (3) MG/3ML IN SOLN
3.0000 mL | Freq: Once | RESPIRATORY_TRACT | Status: AC
Start: 2022-03-02 — End: 2022-03-02
  Administered 2022-03-02: 3 mL via RESPIRATORY_TRACT
  Filled 2022-03-02: qty 3

## 2022-03-02 MED ORDER — ALBUTEROL SULFATE (2.5 MG/3ML) 0.083% IN NEBU: 3.0000 mL | INHALATION_SOLUTION | RESPIRATORY_TRACT | Status: DC | PRN

## 2022-03-02 MED ORDER — PENTOXIFYLLINE ER 400 MG PO TBCR: 400.0000 mg | EXTENDED_RELEASE_TABLET | Freq: Three times a day (TID) | ORAL | Status: DC

## 2022-03-02 MED ORDER — ASPIRIN 81 MG PO TBEC
81.0000 mg | DELAYED_RELEASE_TABLET | Freq: Every day | ORAL | Status: DC
  Administered 2022-03-02 – 2022-03-09 (×8): 81 mg via ORAL
  Filled 2022-03-02 (×8): qty 1

## 2022-03-02 MED ORDER — HYDROMORPHONE HCL 1 MG/ML IJ SOLN
0.5000 mg | INTRAMUSCULAR | Status: DC | PRN
  Administered 2022-03-03 – 2022-03-04 (×3): 0.5 mg via INTRAVENOUS
  Filled 2022-03-02 (×3): qty 0.5

## 2022-03-02 MED ORDER — OXYCODONE HCL 5 MG PO TABS
5.0000 mg | ORAL_TABLET | Freq: Four times a day (QID) | ORAL | Status: DC | PRN
  Administered 2022-03-02 – 2022-03-05 (×4): 5 mg via ORAL
  Filled 2022-03-02 (×4): qty 1

## 2022-03-02 MED ORDER — ENOXAPARIN SODIUM 40 MG/0.4ML IJ SOSY
40.0000 mg | PREFILLED_SYRINGE | INTRAMUSCULAR | Status: DC
Start: 2022-03-02 — End: 2022-03-10
  Administered 2022-03-02 – 2022-03-09 (×8): 40 mg via SUBCUTANEOUS
  Filled 2022-03-02 (×8): qty 0.4

## 2022-03-02 MED ORDER — INSULIN GLARGINE-YFGN 100 UNIT/ML ~~LOC~~ SOLN
10.0000 [IU] | Freq: Every day | SUBCUTANEOUS | Status: DC
  Administered 2022-03-02: 10 [IU] via SUBCUTANEOUS
  Filled 2022-03-02 (×2): qty 0.1

## 2022-03-02 MED ORDER — MEMANTINE HCL 10 MG PO TABS
5.0000 mg | ORAL_TABLET | Freq: Every day | ORAL | Status: DC
  Administered 2022-03-02 – 2022-03-09 (×8): 5 mg via ORAL
  Filled 2022-03-02 (×8): qty 1

## 2022-03-02 MED ORDER — LACTATED RINGERS IV BOLUS
1000.0000 mL | Freq: Once | INTRAVENOUS | Status: AC
  Administered 2022-03-02: 1000 mL via INTRAVENOUS

## 2022-03-02 MED ORDER — PROCHLORPERAZINE MALEATE 10 MG PO TABS
10.0000 mg | ORAL_TABLET | Freq: Four times a day (QID) | ORAL | Status: DC | PRN
Start: 2022-03-02 — End: 2022-03-10
  Administered 2022-03-03: 10 mg via ORAL
  Filled 2022-03-02: qty 1

## 2022-03-02 MED ORDER — CARVEDILOL 3.125 MG PO TABS
3.1250 mg | ORAL_TABLET | Freq: Two times a day (BID) | ORAL | Status: DC
  Administered 2022-03-02 – 2022-03-09 (×15): 3.125 mg via ORAL
  Filled 2022-03-02 (×15): qty 1

## 2022-03-02 MED ORDER — INSULIN ASPART 100 UNIT/ML IJ SOLN
0.0000 [IU] | Freq: Three times a day (TID) | INTRAMUSCULAR | Status: DC
  Administered 2022-03-02: 20 [IU] via SUBCUTANEOUS
  Administered 2022-03-03 (×2): 11 [IU] via SUBCUTANEOUS
  Administered 2022-03-03: 4 [IU] via SUBCUTANEOUS
  Administered 2022-03-04 (×2): 7 [IU] via SUBCUTANEOUS
  Administered 2022-03-05: 4 [IU] via SUBCUTANEOUS

## 2022-03-02 MED ORDER — ARFORMOTEROL TARTRATE 15 MCG/2ML IN NEBU
15.0000 ug | INHALATION_SOLUTION | Freq: Two times a day (BID) | RESPIRATORY_TRACT | Status: DC
  Administered 2022-03-03 – 2022-03-09 (×13): 15 ug via RESPIRATORY_TRACT
  Filled 2022-03-02 (×15): qty 2

## 2022-03-02 MED ORDER — IPRATROPIUM-ALBUTEROL 0.5-2.5 (3) MG/3ML IN SOLN
3.0000 mL | Freq: Four times a day (QID) | RESPIRATORY_TRACT | Status: DC
  Administered 2022-03-02 – 2022-03-03 (×3): 3 mL via RESPIRATORY_TRACT
  Filled 2022-03-02 (×3): qty 3

## 2022-03-02 MED ORDER — PANTOPRAZOLE SODIUM 40 MG PO TBEC
40.0000 mg | DELAYED_RELEASE_TABLET | Freq: Every day | ORAL | Status: DC
  Administered 2022-03-03 – 2022-03-05 (×3): 40 mg via ORAL
  Filled 2022-03-02 (×3): qty 1

## 2022-03-02 MED ORDER — INSULIN ASPART 100 UNIT/ML IJ SOLN
0.0000 [IU] | Freq: Every day | INTRAMUSCULAR | Status: DC
  Administered 2022-03-02: 3 [IU] via SUBCUTANEOUS
  Administered 2022-03-03 – 2022-03-08 (×5): 2 [IU] via SUBCUTANEOUS

## 2022-03-02 MED ORDER — BISACODYL 5 MG PO TBEC: 5.0000 mg | DELAYED_RELEASE_TABLET | Freq: Every day | ORAL | Status: DC | PRN

## 2022-03-02 MED ORDER — FERROUS SULFATE 325 (65 FE) MG PO TABS
325.0000 mg | ORAL_TABLET | Freq: Two times a day (BID) | ORAL | Status: DC
  Administered 2022-03-02 – 2022-03-09 (×15): 325 mg via ORAL
  Filled 2022-03-02 (×15): qty 1

## 2022-03-02 NOTE — Inpatient Diabetes Management (Signed)
Inpatient Diabetes Program Recommendations  AACE/ADA: New Consensus Statement on Inpatient Glycemic Control (2015)  Target Ranges:  Prepandial:   less than 140 mg/dL      Peak postprandial:   less than 180 mg/dL (1-2 hours)      Critically ill patients:  140 - 180 mg/dL   Lab Results  Component Value Date   GLUCAP 272 (H) 03/09/2021   HGBA1C 7.4 (H) 02/13/2021    Review of Glycemic Control  Diabetes history: DM2 Outpatient Diabetes medications: Lantus 5 units QHS, Metformin 500 mg QD, Glipizide 5 mg QD, Actos 15 mg QD Current orders for Inpatient glycemic control: Decadron 4 mg Q12H  Inpatient Diabetes Program Recommendations:    Novolog 0-15 units TID and 0-5 units QHS.   Will continue to follow while inpatient.  Thank you, Reche Dixon, MSN, [place] Diabetes Coordinator Inpatient Diabetes Program [phone] (team pager from 8a-5p)

## 2022-03-02 NOTE — Progress Notes (Cosign Needed)
HEMATOLOGY-ONCOLOGY PROGRESS NOTE  ASSESSMENT AND PLAN: This is a 81 year old female with stage IIIb (T3, N2, MX) non-small cell lung cancer, squamous cell carcinoma.  She initially presented with a large right middle lobe lung mass in addition to subcarinal lymphadenopathy.  She was initially diagnosed in May 2022.  PD-L1 testing was 95%.  She completed a course of concurrent chemoradiation with carboplatin for an AUC of 2 and paclitaxel 45 mg per metered squared.  Last dose on 04/20/2021.  She was then started on consolidation immunotherapy with Imfinzi 1500 mg IV every 4 weeks.  She completed 3 cycles of treatment.  She was then lost to follow-up.  Our office made multiple attempts to try to reach the patient and send a discharge letter to her.  We did not hear back from her.  She has underlying dementia.  Now presents to the emergency department with chest discomfort.  CTA chest negative for PE and right middle lobe lung mass is stable.  She is noted to have a right pleural effusion and progression of T7 vertebral body compression and new T7 vertebral body sclerosis suspicious for osseous metastatic disease.  She has been started on dexamethasone 4 mg IV twice a day.  On-call MD has recommended additional work-up including a CT of the abdomen/pelvis as well as MRI of the spine.  Would also consider ultrasound-guided thoracentesis and send fluid for cytology.  May need consideration of palliative radiation to T7 depending on further work-up.  Uncertain if she would be at candidate for systemic therapy and her overall performance status and challenges getting her to follow-up in our office on a routine basis.  I have placed a palliative care consultation to assist with goals of care discussion.  Mikey Bussing  SUBJECTIVE: Ms. Stanis is a 81 year old female with a past medical history significant for hyperlipidemia, history of left breast cancer, hypertension, depression, COPD, diabetes mellitus,  dementia, stage III squamous cell carcinoma of the right lung.  She completed a course of concurrent chemoradiation with carboplatin for an AUC of 2 and paclitaxel 45 mg per metered squared which was last given on 04/20/2021.  She was then started on consolidation immunotherapy with Imfinzi 1500 mg IV every 4 weeks.  She completed 3 cycles of this treatment and then was lost to follow-up.  There are multiple telephone messages documented in epic that we reached out to the patient but did not hear back.  A letter was sent to the patient on 11/27/2021 notifying her that she was discharged from our practice.  She presented to the emergency department with chest pain and shortness of breath.  She reportedly has been taking tramadol for the pain.  CTA chest was performed which was negative for PE, unchanged middle lobe mass, right perihilar opacity and basilar nodularities and small volume right pleural effusion, progression of T7 vertebral body compression now with 50% loss of height and new T7 vertebral body sclerosis suspicious for osseous metastatic disease.  On-call MD recommended starting the patient on dexamethasone 4 mg IV twice a day and also recommended additional staging work-up including CT of the abdomen/pelvis and MRI of the spine.  She will be transferred to [facility].  I saw the patient in the emergency department.  No family at the bedside.  I talked with the patient regarding her lack of follow-up with oncology and she does not seem to recall much in terms of her cancer diagnosis.  She does not recall missing any appointments or receiving  a letter from our office.  The patient states that she drives herself to appointments.  She continues to have pain to her chest over her breast.  No significant shortness of breath at this time.  She offers no other complaints today.  Oncology History  Breast cancer of upper-outer quadrant of left female breast ([place])  08/14/2015 Mammogram   left breast  focal asymmetry, ultrasound revealed 2.5 cm lesion. Because of breast pain, patient refuses diagnostic mammograms.   08/14/2015 Initial Diagnosis   left breast biopsy: Invasive ductal carcinoma with DCIS grade 1-2, ER 100%, PR 20%, Ki-67 20%, HER-2 negative ratio 1.77   08/14/2015 Clinical Stage   Stage IIA: T2 N0   09/17/2015 Surgery   left breast lumpectomy: Invasive ductal carcinoma with calcifications, grade 1, 2.5 cm, low-grade DCIS, LVI present, 0/1 lymph nodes negative, ER 100%, PR 20%, HER-2 negative, Ki-67 20%   09/17/2015 Pathologic Stage   Stage IIA: T2 N0   11/18/2015 - 12/16/2015 Radiation Therapy   Adjuvant radiation therapy: Left breast treated to 40.05 Gy in 15 fractions, Left breast boost treated to 10 Gy in 5 fractions   12/19/2015 -  Anti-estrogen oral therapy   Anastrozole 1 mg daily 5 years   02/12/2016 Survivorship   Survivorship visit completed and copy given to patient   Stage III squamous cell carcinoma of lung, right ([place])  02/19/2021 Initial Diagnosis   Stage III squamous cell carcinoma of left lung ([place])   03/02/2021 - 04/20/2021 Chemotherapy   Patient is on Treatment Plan : LUNG Carboplatin / Paclitaxel + XRT q7d     06/23/2021 -  Chemotherapy   Patient is on Treatment Plan : LUNG NSCLC Durvalumab q28d     07/20/2021 Cancer Staging   Staging form: Lung, AJCC 8th Edition - Clinical: Stage IIIB (cT3, cN2, cM0) - Signed by Curt Bears, MD on 07/20/2021      REVIEW OF SYSTEMS:   Review of Systems  Constitutional:  Negative for chills and fever.  Eyes: Negative.   Respiratory:  Negative for hemoptysis and shortness of breath.   Cardiovascular:  Positive for chest pain.  Skin: Negative.   Neurological:  Negative for dizziness and headaches.    I have reviewed the past medical history, past surgical history, social history and family history with the patient and they are unchanged from previous note.   PHYSICAL EXAMINATION: ECOG PERFORMANCE STATUS: 3 -  Symptomatic, >50% confined to bed  Vitals:   03/02/22 1029 03/02/22 1300  BP: (!) 158/76 (!) 166/75  Pulse: (!) 104 (!) 114  Resp: 15 20  Temp: (!) 97.5 F (36.4 C)   SpO2: 96% 100%   There were no vitals filed for this visit.  Intake/Output from previous day: No intake/output data recorded.  Physical Exam Vitals reviewed.  Constitutional:      Comments: Poor historian  HENT:     Mouth/Throat:     Pharynx: No oropharyngeal exudate or posterior oropharyngeal erythema.  Eyes:     General: No scleral icterus.    Conjunctiva/sclera: Conjunctivae normal.  Cardiovascular:     Rate and Rhythm: Tachycardia present.  Pulmonary:     Effort: Pulmonary effort is normal. No respiratory distress.  Neurological:     Mental Status: She is alert.   LABORATORY DATA:  I have reviewed the data as listed    Latest Ref Rng & Units 03/02/2022   11:11 AM 03/02/2022   11:07 AM 08/17/2021    9:29 AM  CMP  Glucose  70 - 99 mg/dL  334  225   BUN 8 - 23 mg/dL  26  41   Creatinine 0.44 - 1.00 mg/dL  1.11  1.01   Sodium 135 - 145 mmol/L 137  137  138   Potassium 3.5 - 5.1 mmol/L 4.3  4.3  4.3   Chloride 98 - 111 mmol/L  102  104   CO2 22 - 32 mmol/L  20  24   Calcium 8.9 - 10.3 mg/dL  9.6  9.8   Total Protein 6.5 - 8.1 g/dL  6.8  7.9   Total Bilirubin 0.3 - 1.2 mg/dL  0.8  0.4   Alkaline Phos 38 - 126 U/L  110  119   AST 15 - 41 U/L  21  23   ALT 0 - 44 U/L  11  14     Lab Results  Component Value Date   WBC 4.7 03/02/2022   HGB 13.6 03/02/2022   HCT 40.0 03/02/2022   MCV 96.7 03/02/2022   PLT 218 03/02/2022   NEUTROABS 3.9 03/02/2022    No results found for: "CEA1", "CEA", "XTG626", "CA125", "PSA1"  CT Angio Chest PE W and/or Wo Contrast  Result Date: 03/02/2022 CLINICAL DATA:  Pulmonary embolism (PE) suspected, high prob. History of non-small cell lung cancer EXAM: CT ANGIOGRAPHY CHEST WITH CONTRAST TECHNIQUE: Multidetector CT imaging of the chest was performed using the standard  protocol during bolus administration of intravenous contrast. Multiplanar CT image reconstructions and MIPs were obtained to evaluate the vascular anatomy. RADIATION DOSE REDUCTION: This exam was performed according to the departmental dose-optimization program which includes automated exposure control, adjustment of the mA and/or kV according to patient size and/or use of iterative reconstruction technique. CONTRAST:  131m OMNIPAQUE IOHEXOL 350 MG/ML SOLN COMPARISON:  Chest XR, 03/02/2022.  CT chest, 09/09/2021. FINDINGS: Cardiovascular: Satisfactory opacification of the pulmonary arteries to the segmental level. No evidence of segmental or larger pulmonary embolus. Normal heart size. No pericardial effusion. Mediastinum/Nodes: No enlarged mediastinal, hilar, or axillary lymph nodes. Thyroid gland, trachea, and esophagus demonstrate no significant findings. Lungs/Pleura: Increased volume of small volume RIGHT basilar pneumothorax. Small volume RIGHT pleural effusion, unchanged. Accounting for volume of inhalation, similar pulmonary findings with middle lobe mass measuring 3.2 x 2.5 cm (previously 3.3 x 2.5 cm), RIGHT perihilar bandlike opacity, calcified granulomas, and small RIGHT basilar nodular opacities. Background of mild underlying centrilobular emphysematous lung change with LEFT basilar pulmonary cyst and LEFT upper lobe anterior for parenchymal thickening. Upper Abdomen: No acute abnormality. Cholecystectomy. Splenic granulomas. Bilateral renal hypodense lesions, incompletely assessed on this evaluation though previously described as cysts. Musculoskeletal: LEFT breast lumpectomy changes. Progression of vertebral compression at T7 vertebral body, now with approximately 50% loss of height. T7 vertebral body sclerosis suspicious for osseous metastatic disease. Similar appearance of T12 chronic superior endplate compression deformity. Review of the MIP images confirms the above findings. IMPRESSION: 1. No  segmental or larger pulmonary embolus. 2. Similar pulmonary findings, accounting for volume of inhalation, with unchanged middle lobe mass, RIGHT perihilar opacity and basilar nodularities and small volume RIGHT pleural effusion within a background of mild centrilobular emphysema 3. Progression of T7 vertebral body compression, now with 50% loss of height. New T7 vertebral body sclerosis is suspicious for osseous metastatic disease given patient history. Electronically Signed   By: JMichaelle BirksM.D.   On: 03/02/2022 14:41   DG Chest 1 View  Result Date: 03/02/2022 CLINICAL DATA:  [id number] pain and shortness of  breath. History of smoking, COPD, COVID in 2022, breast cancer, diabetes and hypertension EXAM: CHEST  1 VIEW COMPARISON:  February 16, 2021 FINDINGS: Cardiomediastinal silhouette has a normal appearance. Left lung remains clear. There is some opacity seen at the right mid lung zone and at the right lung base with blunting of the CP angle due to pleural effusion and with superimposed likely atelectasis-pulmonary scarring. The visualized skeletal structures are unremarkable. IMPRESSION: Small right pleural effusion, right basilar atelectasis. Atelectasis and/or scarring at the right mid lung zone. These findings are more prominent in the present study. Electronically Signed   By: Frazier Richards M.D.   On: 03/02/2022 11:30     No future appointments.    LOS: 0 days   ADDENDUM: Hematology/Oncology Attending: The patient is seen and examined today.  I reviewed her records, lab and scan and recommended her care plan.  I agree with the above note.  This is a 81 years old white female with significant dementia and diagnosis of stage IIIb (T3, N2, MX) non-small cell lung cancer, squamous cell carcinoma diagnosed in May 2022 with PD-L1 expression of 95%.  The patient underwent a course of concurrent chemoradiation with weekly carboplatin and paclitaxel for 7 cycles and tolerated her treatment well and has  partial response.  She started treatment with consolidation immunotherapy with Imfinzi every 4 weeks first dose was given June 24, 2021 status post 1 cycle.  She was lost to follow-up since that time and the patient did not show up for any additional treatment or visits.  We have been calling her as well as her son regularly for long time with no response and the patient was discharged from my practice. She presented to the emergency department yesterday complaining of left-sided chest discomfort.  She had CT angiogram of the chest performed yesterday that was negative for pulmonary embolism and the previous disease was stable in the lung with small right pleural effusion and progressive T7 vertebral body compression and new T7 vertebral body sclerosis suspicious for osseous metastasis. The patient was started on Decadron 4 mg IV twice daily. I saw the patient earlier today and she continues to complain of the discomfort on the left side of her chest. She probably will need MRI of the thoracic spine for further evaluation of the T7 lesion and if concerning for neoplastic process, she may benefit from palliative radiotherapy by radiation oncology. Regarding any additional systemic treatment, I do not think the patient will be candidate for any additional treatment because of noncompliance and also severe dementia and poor performance status. I would strongly recommend for the patient to consider palliative care and hospice after managing her symptoms from the back pain with either pain medication or radiotherapy if it is positive for metastatic disease. I will sign off on her care at this point but I will be happy to see her in the future if needed. Disclaimer: This note was dictated with voice recognition software. Similar sounding words can inadvertently be transcribed and may be missed upon review. Eilleen Kempf, MD

## 2022-03-02 NOTE — ED Notes (Signed)
Report given to [place] at [facility]

## 2022-03-02 NOTE — H&P (Signed)
History and Physical    Dawn [name]:[id number] DOB: 07/17/1941 DOA: 03/02/2022  PCP: Lawerance Cruel, MD (Confirm with patient/family/NH records and if not entered, this has to be entered at [facility] point of entry) Patient coming from: Home  I have personally briefly reviewed patient's old medical records in [place]  Chief Complaint: Chest pain  HPI: Dawn [name] is a 81 y.o. female with medical history significant of squamous cell lung carcinoma stage IIIb on the right side, IDDM, HTN, HLD, anxiety/depression, dementia with severe memory loss, COPD, presented with new onset chest pains.  Symptoms started yesterday has been constant sharp-like chest pain centrally located, nonradiating, worsening with deep breath and body movement.  Denies any cough, no fever or chills, denies any associated symptoms started palpitations nauseous vomiting or lightheadedness.  ED Course: Afebrile, no tachycardia nonhypotensive, nonhypoxic. CT angiogram negative for PE, stable right middle lobe lung mass, moderate right-sided pleural effusion and progression of T7 vertebral body compression fracture and other suspicious signs of metastatic lesions.  Oncology was called, patient was started on Decadron 4 mg twice daily  Review of Systems: As per HPI otherwise 14 point review of systems negative.    Past Medical History:  Diagnosis Date   Anemia    Anxiety    Arthritis    Breast cancer of upper-outer quadrant of left female breast ([place]) 08/18/2015   Broken wrist    when younger x2   COPD (chronic obstructive pulmonary disease) ([place])    Hospitalization March 2011   COVID-19 01/22/2021   Dementia ([place])    Depression    Diabetes mellitus without complication ([place])    type 2   Dyspnea    Full dentures    H/O vaginal delivery 1962   x1    High cholesterol    History of home oxygen therapy    Hypertension    Memory changes    sharp decline over the last 3 months, per son as of 01/22/21,     Pneumonia 01/22/2021   Radiation    11/18/15- 12/16/15 to her Left Breast    Past Surgical History:  Procedure Laterality Date   ABDOMINAL HYSTERECTOMY     APPENDECTOMY     BRONCHIAL BIOPSY  02/16/2021   Procedure: BRONCHIAL BIOPSIES;  Surgeon: Collene Gobble, MD;  Location: [facility];  Service: Pulmonary;;   BRONCHIAL BRUSHINGS  02/16/2021   Procedure: BRONCHIAL BRUSHINGS;  Surgeon: Collene Gobble, MD;  Location: [facility];  Service: Pulmonary;;   BRONCHIAL NEEDLE ASPIRATION BIOPSY  02/16/2021   Procedure: BRONCHIAL NEEDLE ASPIRATION BIOPSIES;  Surgeon: Collene Gobble, MD;  Location: [facility];  Service: Pulmonary;;   CHOLECYSTECTOMY     EYE SURGERY     cataracts   RADIOACTIVE SEED GUIDED PARTIAL MASTECTOMY WITH AXILLARY SENTINEL LYMPH NODE BIOPSY Left 09/17/2015   Procedure: RADIOACTIVE SEED LOCALIZATION LEFT BREAST LUMPECTOMY AND LEFT AXILLARY SENTINEL LYMPH NODE BIOPSY;  Surgeon: Excell Seltzer, MD;  Location: [facility];  Service: General;  Laterality: Left;   TONSILLECTOMY     VIDEO BRONCHOSCOPY WITH ENDOBRONCHIAL NAVIGATION Right 02/16/2021   Procedure: VIDEO BRONCHOSCOPY WITH ENDOBRONCHIAL NAVIGATION;  Surgeon: Collene Gobble, MD;  Location: [facility];  Service: Pulmonary;  Laterality: Right;     reports that she has quit smoking. Her smoking use included cigarettes. She has a 70.00 pack-year smoking history. She has never used smokeless tobacco. She reports that she does not currently use alcohol. She reports that she does not use  drugs.  Allergies  Allergen Reactions   Ferumoxytol Shortness Of Breath, Itching and Other (See Comments)    Pt reported lightheadedness, a flushed feeling, SOB, and itching   Pneumococcal Vaccines Hives, Swelling and Other (See Comments)    Arm became swollen   Repaglinide Other (See Comments)    Dropped BGL too low    Family History  Problem Relation Age of Onset   Colon cancer Sister    Lung cancer Sister      Prior to Admission  medications   Medication Sig Start Date End Date Taking? Authorizing Provider  acetaminophen-codeine (TYLENOL #3) 300-30 MG tablet Take 1 tablet by mouth every 6 (six) hours as needed. 01/06/21   [provider]  albuterol (VENTOLIN HFA) 108 (90 Base) MCG/ACT inhaler Inhale 2 puffs into the lungs every 2 (two) hours as needed for wheezing or shortness of breath. Please instruct in usage 02/07/21   Medina-Vargas, Monina C, NP  aspirin EC 81 MG tablet Take 81 mg by mouth daily.    [provider]  Cholecalciferol (VITAMIN D3) 125 MCG (5000 UT) CAPS 1 tablet    [provider]  ferrous sulfate 325 (65 FE) MG tablet Take 325 mg by mouth 2 (two) times daily.    [provider]  glipiZIDE (GLUCOTROL) 5 MG tablet Take 5 mg by mouth every morning. 05/20/21   [provider]  Lancets (ONETOUCH ULTRASOFT) lancets USE TO TEST BLOOD GLUCOSE ONCE A DAY 03/12/21   [provider]  LANTUS 100 UNIT/ML injection Inject 5 Units into the skin at bedtime. 03/10/21   [provider]  memantine (NAMENDA) 5 MG tablet Take by mouth. 03/26/21   [provider]  metFORMIN (GLUCOPHAGE-XR) 500 MG 24 hr tablet 1 tablet with evening meal 02/14/21   [provider]  nitroGLYCERIN (NITROSTAT) 0.4 MG SL tablet Place 0.4 mg under the tongue every 15 (fifteen) minutes as needed for chest pain.    [provider]  pantoprazole (PROTONIX) 40 MG tablet Take 1 tablet (40 mg total) by mouth daily before breakfast. 02/07/21 03/09/21  Medina-Vargas, Monina C, NP  pentoxifylline (TRENTAL) 400 MG CR tablet 1 tablet with meals    [provider]  pioglitazone (ACTOS) 15 MG tablet Take 1 tablet (15 mg total) by mouth daily. 02/14/21   Mercy Riding, MD  prochlorperazine (COMPAZINE) 10 MG tablet Take 1 tablet (10 mg total) by mouth every 6 (six) hours as needed. 02/19/21   Heilingoetter, Cassandra L, PA-C  rosuvastatin (CRESTOR) 40 MG tablet Take 1 tablet (40 mg  total) by mouth daily. 02/07/21   Medina-Vargas, Monina C, NP  Tiotropium Bromide-Olodaterol (STIOLTO RESPIMAT) 2.5-2.5 MCG/ACT AERS Inhale 2 puffs into the lungs daily. 02/11/21   Collene Gobble, MD  traMADol Veatrice [place]) 50 MG tablet 1 tablet as needed 02/12/21   [provider]  [place] X 5/16" 1 ML MISC INJECT AS DIRECTED ONCE A DAY 03/10/21   [provider]    Physical Exam: Vitals:   03/02/22 1029 03/02/22 1300 03/02/22 1518  BP: (!) 158/76 (!) 166/75 (!) 144/94  Pulse: (!) 104 (!) 114 67  Resp: 15 20 16   Temp: (!) 97.5 F (36.4 C)  98.3 F (36.8 C)  TempSrc: Oral  Oral  SpO2: 96% 100% 100%    Constitutional: NAD, calm, comfortable Vitals:   03/02/22 1029 03/02/22 1300 03/02/22 1518  BP: (!) 158/76 (!) 166/75 (!) 144/94  Pulse: (!) 104 (!) 114 67  Resp: 15 20 16   Temp: (!) 97.5 F (36.4 C)  98.3 F (36.8 C)  TempSrc: Oral  Oral  SpO2: 96% 100% 100%   Eyes: PERRL, lids and conjunctivae normal ENMT: Mucous membranes are moist. Posterior pharynx clear of any exudate or lesions.Normal dentition.  Neck: normal, supple, no masses, no thyromegaly Respiratory: clear to auscultation bilaterally, diffused wheezing, no crackles.  Increasing respiratory effort. No accessory muscle use.  Cardiovascular: Regular rate and rhythm, no murmurs / rubs / gallops. No extremity edema. 2+ pedal pulses. No carotid bruits.  Abdomen: no tenderness, no masses palpated. No hepatosplenomegaly. Bowel sounds positive.  Musculoskeletal: no clubbing / cyanosis. No joint deformity upper and lower extremities. Good ROM, no contractures. Normal muscle tone.  Tenderness on the midportion of the sternum Skin: no rashes, lesions, ulcers. No induration Neurologic: CN 2-12 grossly intact. Sensation intact, DTR normal. Strength 5/5 in all 4.  Psychiatric: Normal judgment and insight. Alert and oriented x 3. Normal mood.     Labs on Admission: I have personally reviewed  following labs and imaging studies  CBC: Recent Labs  Lab 03/02/22 1107 03/02/22 1111  WBC 4.7  --   NEUTROABS 3.9  --   HGB 12.8 13.6  HCT 41.0 40.0  MCV 96.7  --   PLT 218  --    Basic Metabolic Panel: Recent Labs  Lab 03/02/22 1107 03/02/22 1111  NA 137 137  K 4.3 4.3  CL 102  --   CO2 20*  --   GLUCOSE 334*  --   BUN 26*  --   CREATININE 1.11*  --   CALCIUM 9.6  --    GFR: CrCl cannot be calculated (Unknown ideal weight.). Liver Function Tests: Recent Labs  Lab 03/02/22 1107  AST 21  ALT 11  ALKPHOS 110  BILITOT 0.8  PROT 6.8  ALBUMIN 3.6   No results for input(s): "LIPASE", "AMYLASE" in the last 168 hours. No results for input(s): "AMMONIA" in the last 168 hours. Coagulation Profile: No results for input(s): "INR", "PROTIME" in the last 168 hours. Cardiac Enzymes: No results for input(s): "CKTOTAL", "CKMB", "CKMBINDEX", "TROPONINI" in the last 168 hours. BNP (last 3 results) No results for input(s): "PROBNP" in the last 8760 hours. HbA1C: No results for input(s): "HGBA1C" in the last 72 hours. CBG: No results for input(s): "GLUCAP" in the last 168 hours. Lipid Profile: No results for input(s): "CHOL", "HDL", "LDLCALC", "TRIG", "CHOLHDL", "LDLDIRECT" in the last 72 hours. Thyroid Function Tests: No results for input(s): "TSH", "T4TOTAL", "FREET4", "T3FREE", "THYROIDAB" in the last 72 hours. Anemia Panel: No results for input(s): "VITAMINB12", "FOLATE", "FERRITIN", "TIBC", "IRON", "RETICCTPCT" in the last 72 hours. Urine analysis:    Component Value Date/Time   COLORURINE YELLOW 02/13/2021 0610   APPEARANCEUR CLOUDY (A) 02/13/2021 0610   LABSPEC 1.015 02/13/2021 0610   PHURINE 5.0 02/13/2021 0610   GLUCOSEU 50 (A) 02/13/2021 0610   HGBUR NEGATIVE 02/13/2021 0610   BILIRUBINUR NEGATIVE 02/13/2021 0610   KETONESUR NEGATIVE 02/13/2021 0610   PROTEINUR 30 (A) 02/13/2021 0610   UROBILINOGEN 0.2 11/25/2009 1109   NITRITE NEGATIVE 02/13/2021 0610    LEUKOCYTESUR NEGATIVE 02/13/2021 0610    Radiological Exams on Admission: CT Angio Chest PE W and/or Wo Contrast  Result Date: 03/02/2022 CLINICAL DATA:  Pulmonary embolism (PE) suspected, high prob. History of non-small cell lung cancer EXAM: CT ANGIOGRAPHY CHEST WITH CONTRAST TECHNIQUE: Multidetector CT imaging of the chest was performed using the standard protocol during bolus administration of  intravenous contrast. Multiplanar CT image reconstructions and MIPs were obtained to evaluate the vascular anatomy. RADIATION DOSE REDUCTION: This exam was performed according to the departmental dose-optimization program which includes automated exposure control, adjustment of the mA and/or kV according to patient size and/or use of iterative reconstruction technique. CONTRAST:  140mL OMNIPAQUE IOHEXOL 350 MG/ML SOLN COMPARISON:  Chest XR, 03/02/2022.  CT chest, 09/09/2021. FINDINGS: Cardiovascular: Satisfactory opacification of the pulmonary arteries to the segmental level. No evidence of segmental or larger pulmonary embolus. Normal heart size. No pericardial effusion. Mediastinum/Nodes: No enlarged mediastinal, hilar, or axillary lymph nodes. Thyroid gland, trachea, and esophagus demonstrate no significant findings. Lungs/Pleura: Increased volume of small volume RIGHT basilar pneumothorax. Small volume RIGHT pleural effusion, unchanged. Accounting for volume of inhalation, similar pulmonary findings with middle lobe mass measuring 3.2 x 2.5 cm (previously 3.3 x 2.5 cm), RIGHT perihilar bandlike opacity, calcified granulomas, and small RIGHT basilar nodular opacities. Background of mild underlying centrilobular emphysematous lung change with LEFT basilar pulmonary cyst and LEFT upper lobe anterior for parenchymal thickening. Upper Abdomen: No acute abnormality. Cholecystectomy. Splenic granulomas. Bilateral renal hypodense lesions, incompletely assessed on this evaluation though previously described as cysts.  Musculoskeletal: LEFT breast lumpectomy changes. Progression of vertebral compression at T7 vertebral body, now with approximately 50% loss of height. T7 vertebral body sclerosis suspicious for osseous metastatic disease. Similar appearance of T12 chronic superior endplate compression deformity. Review of the MIP images confirms the above findings. IMPRESSION: 1. No segmental or larger pulmonary embolus. 2. Similar pulmonary findings, accounting for volume of inhalation, with unchanged middle lobe mass, RIGHT perihilar opacity and basilar nodularities and small volume RIGHT pleural effusion within a background of mild centrilobular emphysema 3. Progression of T7 vertebral body compression, now with 50% loss of height. New T7 vertebral body sclerosis is suspicious for osseous metastatic disease given patient history. Electronically Signed   By: Michaelle Birks M.D.   On: 03/02/2022 14:41   DG Chest 1 View  Result Date: 03/02/2022 CLINICAL DATA:  [id number] chest pain and shortness of breath. History of smoking, COPD, COVID in 2022, breast cancer, diabetes and hypertension EXAM: CHEST  1 VIEW COMPARISON:  February 16, 2021 FINDINGS: Cardiomediastinal silhouette has a normal appearance. Left lung remains clear. There is some opacity seen at the right mid lung zone and at the right lung base with blunting of the CP angle due to pleural effusion and with superimposed likely atelectasis-pulmonary scarring. The visualized skeletal structures are unremarkable. IMPRESSION: Small right pleural effusion, right basilar atelectasis. Atelectasis and/or scarring at the right mid lung zone. These findings are more prominent in the present study. Electronically Signed   By: Frazier Richards M.D.   On: 03/02/2022 11:30    EKG: Independently reviewed. Sinus, no acute ST changes  Assessment/Plan Principal Problem:   Chest pain  (please populate well all problems here in Problem List. (For example, if patient is on BP meds at home and you  resume or decide to hold them, it is a problem that needs to be her. Same for CAD, COPD, HLD and so on)  Chest pain -Pleural -Suspect pleural space/rib/sternum metastasis from advancing squamous cell carcinoma.  CT angiogram evidence of worsening metastatic lesions on T7 and enlarged of right-sided pleural effusion as evidenced. -Patient was on immunotherapy for lung cancer but has not been compliant and stopped following oncology since January 2023 for unknown reasons.  Oncology consulted, recommend patient admitted to [facility] long hospital for evaluation/restaging cancer and probably emergency radiation therapy. -  Other DDx, atypical chest pain, normal stress test with perfusion study March 2023, angina unlikely.  Acute COPD exacerbation -Short course of p.o. steroid, ED were discussed with patient on Decadron, will continue -DuoNebs -Continue Breo  IDDM with hyperglycemia -Hold off metformin for patient received IV contrast today -Increase Lantus to 10 units daily -Sliding scale with resistance scale  HTN, uncontrolled -Start Coreg  Dementia/memory loss -Continue memantine  Code Status -Patient desires Full Code  DVT prophylaxis: Lovenox Code Status: Full code Family Communication: None at bedside Disposition Plan: Patient sick with worsening of metastatic lung cancer, requiring emergency staging and possibly inpatient radiation therapy, expect more than 2 midnight hospital stay. Consults called: Oncology Admission status: Medsurg   Lequita Halt MD [facility] Pager [phone]  03/02/2022, 4:43 PM

## 2022-03-02 NOTE — ED Notes (Signed)
Dr,Lawsing made aware of VBG results. ED-Lab.

## 2022-03-02 NOTE — ED Provider Notes (Signed)
[facility] EMERGENCY DEPARTMENT Provider Note   CSN: [id number] Arrival date & time: 03/02/22  1017     History  Chief Complaint  Patient presents with   Chest Pain   Shortness of Breath    Dawn [name] is a 81 y.o. female.   Chest Pain Associated symptoms: shortness of breath   Shortness of Breath Associated symptoms: chest pain     81 year old female with medical history significant for HLD, breast cancer of the left breast, HTN, depression, COPD, DM 2, dementia, squamous cell carcinoma stage III of the right lung status post chemotherapy who presents to the emergency department with chest pain and shortness of breath.  The history is provided by EMS, the patient and the patient's son.  The patient is complained of intermittent sharp substernal chest pain for the past month that would often resolve with Tylenol.  The patient has recently started taking tramadol for this.  She endorses mild pleuritic chest discomfort that worsened over the past few days.  She does live alone and has family who is frequently check on her.  She denies any lower extremity swelling.  She denies any fever, chills or cough.  She was mildly tachypneic with EMS and was administered one half of a nebulizer before she ripped off the machine.  She was administered 125 mg of Solu-Medrol with EMS and refused aspirin and nitroglycerin.  On chart review, it appears that the patient has missed multiple appointments with her oncologist.  Home Medications Prior to Admission medications   Medication Sig Start Date End Date Taking? Authorizing Provider  acetaminophen-codeine (TYLENOL #3) 300-30 MG tablet Take 1 tablet by mouth every 6 (six) hours as needed. 01/06/21   [provider]  albuterol (VENTOLIN HFA) 108 (90 Base) MCG/ACT inhaler Inhale 2 puffs into the lungs every 2 (two) hours as needed for wheezing or shortness of breath. Please instruct in usage 02/07/21   Medina-Vargas, Monina C, NP   aspirin EC 81 MG tablet Take 81 mg by mouth daily.    [provider]  Cholecalciferol (VITAMIN D3) 125 MCG (5000 UT) CAPS 1 tablet    [provider]  ferrous sulfate 325 (65 FE) MG tablet Take 325 mg by mouth 2 (two) times daily.    [provider]  glipiZIDE (GLUCOTROL) 5 MG tablet Take 5 mg by mouth every morning. 05/20/21   [provider]  Lancets (ONETOUCH ULTRASOFT) lancets USE TO TEST BLOOD GLUCOSE ONCE A DAY 03/12/21   [provider]  LANTUS 100 UNIT/ML injection Inject 5 Units into the skin at bedtime. 03/10/21   [provider]  memantine (NAMENDA) 5 MG tablet Take by mouth. 03/26/21   [provider]  metFORMIN (GLUCOPHAGE-XR) 500 MG 24 hr tablet 1 tablet with evening meal 02/14/21   [provider]  nitroGLYCERIN (NITROSTAT) 0.4 MG SL tablet Place 0.4 mg under the tongue every 15 (fifteen) minutes as needed for chest pain.    [provider]  pantoprazole (PROTONIX) 40 MG tablet Take 1 tablet (40 mg total) by mouth daily before breakfast. 02/07/21 03/09/21  Medina-Vargas, Monina C, NP  pentoxifylline (TRENTAL) 400 MG CR tablet 1 tablet with meals    [provider]  pioglitazone (ACTOS) 15 MG tablet Take 1 tablet (15 mg total) by mouth daily. 02/14/21   Mercy Riding, MD  prochlorperazine (COMPAZINE) 10 MG tablet Take 1 tablet (10 mg total) by mouth every 6 (six) hours as needed. 02/19/21  Heilingoetter, Cassandra L, PA-C  rosuvastatin (CRESTOR) 40 MG tablet Take 1 tablet (40 mg total) by mouth daily. 02/07/21   Medina-Vargas, Monina C, NP  Tiotropium Bromide-Olodaterol (STIOLTO RESPIMAT) 2.5-2.5 MCG/ACT AERS Inhale 2 puffs into the lungs daily. 02/11/21   Collene Gobble, MD  traMADol Veatrice Bourbon) 50 MG tablet 1 tablet as needed 02/12/21   [provider]  [place] X 5/16" 1 ML MISC INJECT AS DIRECTED ONCE A DAY 03/10/21   [provider]      Allergies    Ferumoxytol,  Pneumococcal vaccines, and Repaglinide    Review of Systems   Review of Systems  Respiratory:  Positive for shortness of breath.   Cardiovascular:  Positive for chest pain.  All other systems reviewed and are negative.   Physical Exam Updated Vital Signs BP (!) 166/75 (BP Location: Left Arm)   Pulse (!) 114   Temp (!) 97.5 F (36.4 C) (Oral)   Resp 20   SpO2 100%  Physical Exam Vitals and nursing note reviewed.  Constitutional:      General: She is not in acute distress.    Appearance: She is well-developed.  HENT:     Head: Normocephalic and atraumatic.  Eyes:     Conjunctiva/sclera: Conjunctivae normal.  Cardiovascular:     Rate and Rhythm: Normal rate and regular rhythm.     Heart sounds: No murmur heard. Pulmonary:     Effort: Pulmonary effort is normal. No respiratory distress.     Breath sounds: Examination of the right-upper field reveals wheezing. Examination of the left-upper field reveals wheezing. Wheezing present.  Chest:     Comments: Left-sided chest wall tenderness to palpation, no rash Abdominal:     Palpations: Abdomen is soft.     Tenderness: There is no abdominal tenderness.  Musculoskeletal:        General: No swelling.     Cervical back: Neck supple.     Right lower leg: No edema.     Left lower leg: No edema.  Skin:    General: Skin is warm and dry.     Capillary Refill: Capillary refill takes less than 2 seconds.  Neurological:     Mental Status: She is alert.  Psychiatric:        Mood and Affect: Mood normal.     ED Results / Procedures / Treatments   Labs (all labs ordered are listed, but only abnormal results are displayed) Labs Reviewed  COMPREHENSIVE METABOLIC PANEL - Abnormal; Notable for the following components:      Result Value   CO2 20 (*)    Glucose, Bld 334 (*)    BUN 26 (*)    Creatinine, Ser 1.11 (*)    GFR, Estimated 51 (*)    All other components within normal limits  CBC WITH DIFFERENTIAL/PLATELET - Abnormal;  Notable for the following components:   Lymphs Abs 0.6 (*)    All other components within normal limits  I-STAT VENOUS BLOOD GAS, ED - Abnormal; Notable for the following components:   pCO2, Ven 41.3 (*)    Calcium, Ion 1.10 (*)    All other components within normal limits  RESP PANEL BY RT-PCR (FLU A&B, COVID) ARPGX2  BRAIN NATRIURETIC PEPTIDE  TROPONIN I (HIGH SENSITIVITY)    EKG EKG Interpretation  Date/Time:  Tuesday March 02 2022 10:48:02 EDT Ventricular Rate:  102 PR Interval:  184 QRS Duration: 78 QT Interval:  368 QTC Calculation: 479 R Axis:  47 Text Interpretation: Sinus tachycardia Low voltage QRS Borderline ECG When compared with ECG of 12-Feb-2021 18:58, PREVIOUS ECG IS PRESENT Confirmed by Regan Lemming ([id number]) on 03/02/2022 11:19:26 AM  Radiology CT Angio Chest PE W and/or Wo Contrast  Result Date: 03/02/2022 CLINICAL DATA:  Pulmonary embolism (PE) suspected, high prob. History of non-small cell lung cancer EXAM: CT ANGIOGRAPHY CHEST WITH CONTRAST TECHNIQUE: Multidetector CT imaging of the chest was performed using the standard protocol during bolus administration of intravenous contrast. Multiplanar CT image reconstructions and MIPs were obtained to evaluate the vascular anatomy. RADIATION DOSE REDUCTION: This exam was performed according to the departmental dose-optimization program which includes automated exposure control, adjustment of the mA and/or kV according to patient size and/or use of iterative reconstruction technique. CONTRAST:  134mL OMNIPAQUE IOHEXOL 350 MG/ML SOLN COMPARISON:  Chest XR, 03/02/2022.  CT chest, 09/09/2021. FINDINGS: Cardiovascular: Satisfactory opacification of the pulmonary arteries to the segmental level. No evidence of segmental or larger pulmonary embolus. Normal heart size. No pericardial effusion. Mediastinum/Nodes: No enlarged mediastinal, hilar, or axillary lymph nodes. Thyroid gland, trachea, and esophagus demonstrate no significant  findings. Lungs/Pleura: Increased volume of small volume RIGHT basilar pneumothorax. Small volume RIGHT pleural effusion, unchanged. Accounting for volume of inhalation, similar pulmonary findings with middle lobe mass measuring 3.2 x 2.5 cm (previously 3.3 x 2.5 cm), RIGHT perihilar bandlike opacity, calcified granulomas, and small RIGHT basilar nodular opacities. Background of mild underlying centrilobular emphysematous lung change with LEFT basilar pulmonary cyst and LEFT upper lobe anterior for parenchymal thickening. Upper Abdomen: No acute abnormality. Cholecystectomy. Splenic granulomas. Bilateral renal hypodense lesions, incompletely assessed on this evaluation though previously described as cysts. Musculoskeletal: LEFT breast lumpectomy changes. Progression of vertebral compression at T7 vertebral body, now with approximately 50% loss of height. T7 vertebral body sclerosis suspicious for osseous metastatic disease. Similar appearance of T12 chronic superior endplate compression deformity. Review of the MIP images confirms the above findings. IMPRESSION: 1. No segmental or larger pulmonary embolus. 2. Similar pulmonary findings, accounting for volume of inhalation, with unchanged middle lobe mass, RIGHT perihilar opacity and basilar nodularities and small volume RIGHT pleural effusion within a background of mild centrilobular emphysema 3. Progression of T7 vertebral body compression, now with 50% loss of height. New T7 vertebral body sclerosis is suspicious for osseous metastatic disease given patient history. Electronically Signed   By: Michaelle Birks M.D.   On: 03/02/2022 14:41   DG Chest 1 View  Result Date: 03/02/2022 CLINICAL DATA:  [id number] chest pain and shortness of breath. History of smoking, COPD, COVID in 2022, breast cancer, diabetes and hypertension EXAM: CHEST  1 VIEW COMPARISON:  February 16, 2021 FINDINGS: Cardiomediastinal silhouette has a normal appearance. Left lung remains clear. There is some  opacity seen at the right mid lung zone and at the right lung base with blunting of the CP angle due to pleural effusion and with superimposed likely atelectasis-pulmonary scarring. The visualized skeletal structures are unremarkable. IMPRESSION: Small right pleural effusion, right basilar atelectasis. Atelectasis and/or scarring at the right mid lung zone. These findings are more prominent in the present study. Electronically Signed   By: Frazier Richards M.D.   On: 03/02/2022 11:30    Procedures Procedures    Medications Ordered in ED Medications  dexamethasone (DECADRON) injection 4 mg (has no administration in time range)  ipratropium-albuterol (DUONEB) 0.5-2.5 (3) MG/3ML nebulizer solution 3 mL (3 mLs Nebulization Given 03/02/22 1144)  iohexol (OMNIPAQUE) 350 MG/ML injection 100 mL (100 mLs Intravenous Contrast  Given 03/02/22 1350)  lactated ringers bolus 1,000 mL (1,000 mLs Intravenous New Bag/Given 03/02/22 1456)    ED Course/ Medical Decision Making/ A&P Clinical Course as of 03/02/22 1508  Tue Mar 02, 2022  1508 Pulse Rate(!): 114 [JL]    Clinical Course User Index [JL] Regan Lemming, MD                           Medical Decision Making Amount and/or Complexity of Data Reviewed Labs: ordered. Radiology: ordered.  Risk Prescription drug management. Decision regarding hospitalization.    81 year old female with medical history significant for HLD, breast cancer of the left breast, HTN, depression, COPD, DM 2, dementia, squamous cell carcinoma stage III of the right lung status post chemotherapy who presents to the emergency department with chest pain and shortness of breath.  The history is provided by EMS, the patient and the patient's son.  The patient is complained of intermittent sharp substernal chest pain for the past month that would often resolve with Tylenol.  The patient has recently started taking tramadol for this.  She endorses mild pleuritic chest discomfort that  worsened over the past few days.  She does live alone and has family who is frequently check on her.  She denies any lower extremity swelling.  She denies any fever, chills or cough.  She was mildly tachypneic with EMS and was administered one half of a nebulizer before she ripped off the machine.  She was administered 125 mg of Solu-Medrol with EMS and refused aspirin and nitroglycerin.  On arrival, the patient was afebrile, mildly tachycardic in the 1 teens, hypertensive BP 166/75, saturating well on room air.  Sinus tachycardia noted on cardiac telemetry.  Physical exam significant for mild diffuse expiratory wheezing, no rhonchi or rales.  No lower extremity edema.  Concern for potential COPD exacerbation, PE, recurrence/worsening of the patient's lung cancer, additionally considered pneumothorax, pneumonia, musculoskeletal chest wall pain, fracture.  EKG was performed and was significant for sinus tachycardia, rate 102, low voltage, no ischemic changes noted with normal ST segments. Chest x-ray revealed atelectasis and scarring at the right mid lung with a small right pleural effusion.  A CT angiogram was performed to evaluate for PE which revealed no evidence of PE, did reveal evidence of potential metastatic disease with a pathologic fracture in the thoracic spine which could be partially the etiology of the patient's presentation.  Patient also has a small stable pneumothorax compared to prior imaging and a pleural effusion and a persistent right middle lobe mass.  I did discuss the patient with Dr. Alvy Bimler of on-call oncology.  The patient appears to have been lost to follow-up with multiple missed appointments in January and February of this past year.  After discussion with the results with the patient's son over the phone, plan will be for admission and full code.  Oncology recommended starting the patient on 4 mg Decadron IV twice daily.  Patient will need further staging work-up to include  potential MRI of the spine, CT chest abdomen pelvis, admission to the [facility] long hospital.  Hospitalist medicine consulted for admission.  Dr. Roosevelt Locks of hospitalist medicine excepted the patient to [facility] for admission.  The patient was updated regarding the plan of care and agreed with the plan for admission.   Final Clinical Impression(s) / ED Diagnoses Final diagnoses:  Chest pain, unspecified type  Closed fracture of seventh thoracic vertebra, unspecified fracture morphology, initial encounter (  [place])  Squamous cell carcinoma of right lung [facility])    Rx / DC Orders ED Discharge Orders     None         Regan Lemming, MD 03/02/22 1537

## 2022-03-02 NOTE — ED Triage Notes (Signed)
Per EMS son visiting this morning Pt c/o CP and sob. Given 125 solu medrol. Refused ASA and nitro. "Hurt when breath." EMS states she was tachypnea but better. Only 1/2 nebs d/t pt ripping them off.   Hx dementia. Lives by herself

## 2022-03-03 ENCOUNTER — Ambulatory Visit
Admit: 2022-03-03 | Discharge: 2022-03-03 | Disposition: A | Discharge status: Home / Self Care | Payer: Medicare Other | Attending: Radiation Oncology | Admitting: Radiation Oncology

## 2022-03-03 ENCOUNTER — Ambulatory Visit
Admission: RE | Admit: 2022-03-03 | Discharge: 2022-03-03 | Disposition: A | Discharge status: Home / Self Care | Payer: Medicare Other | Source: Ambulatory Visit | Attending: Radiation Oncology | Admitting: Radiation Oncology

## 2022-03-03 DIAGNOSIS — Z7189 Other specified counseling: Secondary | ICD-10-CM | POA: Diagnosis not present

## 2022-03-03 DIAGNOSIS — Z515 Encounter for palliative care: Secondary | ICD-10-CM | POA: Diagnosis not present

## 2022-03-03 DIAGNOSIS — C3491 Malignant neoplasm of unspecified part of right bronchus or lung: Secondary | ICD-10-CM | POA: Diagnosis not present

## 2022-03-03 DIAGNOSIS — M8448XA Pathological fracture, other site, initial encounter for fracture: Secondary | ICD-10-CM | POA: Insufficient documentation

## 2022-03-03 DIAGNOSIS — C7951 Secondary malignant neoplasm of bone: Secondary | ICD-10-CM | POA: Insufficient documentation

## 2022-03-03 DIAGNOSIS — C3490 Malignant neoplasm of unspecified part of unspecified bronchus or lung: Secondary | ICD-10-CM | POA: Insufficient documentation

## 2022-03-03 DIAGNOSIS — Z510 Encounter for antineoplastic radiation therapy: Secondary | ICD-10-CM | POA: Insufficient documentation

## 2022-03-03 DIAGNOSIS — R071 Chest pain on breathing: Secondary | ICD-10-CM | POA: Diagnosis not present

## 2022-03-03 LAB — HEMOGLOBIN A1C
Hgb A1c MFr Bld: 9.6 % — ABNORMAL HIGH (ref 4.8–5.6)
Mean Plasma Glucose: 228.82 mg/dL

## 2022-03-03 LAB — GLUCOSE, CAPILLARY
Glucose-Capillary: 276 mg/dL — ABNORMAL HIGH (ref 70–99)
Glucose-Capillary: 263 mg/dL — ABNORMAL HIGH (ref 70–99)
Glucose-Capillary: 161 mg/dL — ABNORMAL HIGH (ref 70–99)
Glucose-Capillary: 243 mg/dL — ABNORMAL HIGH (ref 70–99)

## 2022-03-03 LAB — HIV ANTIBODY (ROUTINE TESTING W REFLEX): HIV Screen 4th Generation wRfx: NONREACTIVE

## 2022-03-03 MED ORDER — ACETAMINOPHEN 325 MG PO TABS
650.0000 mg | ORAL_TABLET | Freq: Four times a day (QID) | ORAL | Status: DC | PRN
  Administered 2022-03-07: 650 mg via ORAL
  Filled 2022-03-03 (×2): qty 2

## 2022-03-03 MED ORDER — ALBUTEROL SULFATE (2.5 MG/3ML) 0.083% IN NEBU
2.5000 mg | INHALATION_SOLUTION | Freq: Three times a day (TID) | RESPIRATORY_TRACT | Status: DC
  Administered 2022-03-03 (×3): 2.5 mg via RESPIRATORY_TRACT
  Filled 2022-03-03 (×3): qty 3

## 2022-03-03 MED ORDER — ENSURE MAX PROTEIN PO LIQD
11.0000 [oz_av] | Freq: Two times a day (BID) | ORAL | Status: DC
  Administered 2022-03-03 – 2022-03-09 (×11): 11 [oz_av] via ORAL
  Filled 2022-03-03 (×14): qty 330

## 2022-03-03 MED ORDER — INSULIN GLARGINE-YFGN 100 UNIT/ML ~~LOC~~ SOLN
15.0000 [IU] | Freq: Every day | SUBCUTANEOUS | Status: DC
  Administered 2022-03-03 – 2022-03-05 (×3): 15 [IU] via SUBCUTANEOUS
  Filled 2022-03-03 (×3): qty 0.15

## 2022-03-03 MED ORDER — ADULT MULTIVITAMIN W/MINERALS CH
1.0000 | ORAL_TABLET | Freq: Every day | ORAL | Status: DC
  Administered 2022-03-03 – 2022-03-09 (×7): 1 via ORAL
  Filled 2022-03-03 (×7): qty 1

## 2022-03-03 MED ORDER — ALBUTEROL SULFATE (2.5 MG/3ML) 0.083% IN NEBU
2.5000 mg | INHALATION_SOLUTION | Freq: Two times a day (BID) | RESPIRATORY_TRACT | Status: DC
Start: 2022-03-04 — End: 2022-03-04
  Administered 2022-03-04 (×2): 2.5 mg via RESPIRATORY_TRACT
  Filled 2022-03-03 (×2): qty 3

## 2022-03-03 MED ORDER — INSULIN ASPART 100 UNIT/ML IJ SOLN
4.0000 [IU] | Freq: Three times a day (TID) | INTRAMUSCULAR | Status: DC
  Administered 2022-03-03 – 2022-03-05 (×5): 4 [IU] via SUBCUTANEOUS

## 2022-03-03 NOTE — Inpatient Diabetes Management (Signed)
Inpatient Diabetes Program Recommendations  AACE/ADA: New Consensus Statement on Inpatient Glycemic Control (2015)  Target Ranges:  Prepandial:   less than 140 mg/dL      Peak postprandial:   less than 180 mg/dL (1-2 hours)      Critically ill patients:  140 - 180 mg/dL   Lab Results  Component Value Date   GLUCAP 276 (H) 03/03/2022   HGBA1C 9.6 (H) 03/02/2022    Review of Glycemic Control  Latest Reference Range & Units 03/02/22 20:31 03/02/22 21:57 03/03/22 07:39  Glucose-Capillary 70 - 99 mg/dL 350 (H) 289 (H) 276 (H)  (H): Data is abnormally high Diabetes history: DM2 Outpatient Diabetes medications: Lantus 5 units QHS, Metformin 500 mg QD, Glipizide 5 mg QD, Actos 15 mg QD Current orders for Inpatient glycemic control: Semglee 10 units QD, Novolog 0-20 units TID & HS Decadron 4 mg Q12H   Inpatient Diabetes Program Recommendations:     Consider increasing Semglee to 15 units QD and adding Novolog 4 units TID (assuming patient consuming >50% of meals).   Thanks, Bronson Curb, MSN, RNC-OB Diabetes Coordinator [phone] (8a-5p)

## 2022-03-03 NOTE — Plan of Care (Signed)
Pt reports no pain at rest.  Pain with movement per PT.  Problem: Education: Goal: Ability to describe self-care measures that may prevent or decrease complications (Diabetes Survival Skills Education) will improve Outcome: Progressing Goal: Individualized Educational Video(s) Outcome: Progressing   Problem: Coping: Goal: Ability to adjust to condition or change in health will improve Outcome: Progressing   Problem: Fluid Volume: Goal: Ability to maintain a balanced intake and output will improve Outcome: Progressing   Problem: Health Behavior/Discharge Planning: Goal: Ability to identify and utilize available resources and services will improve Outcome: Progressing Goal: Ability to manage health-related needs will improve Outcome: Progressing   Problem: Metabolic: Goal: Ability to maintain appropriate glucose levels will improve Outcome: Progressing   Problem: Nutritional: Goal: Maintenance of adequate nutrition will improve Outcome: Progressing Goal: Progress toward achieving an optimal weight will improve Outcome: Progressing   Problem: Skin Integrity: Goal: Risk for impaired skin integrity will decrease Outcome: Progressing   Problem: Tissue Perfusion: Goal: Adequacy of tissue perfusion will improve Outcome: Progressing   Problem: Education: Goal: Knowledge of General Education information will improve Description: Including pain rating scale, medication(s)/side effects and non-pharmacologic comfort measures Outcome: Progressing   Problem: Health Behavior/Discharge Planning: Goal: Ability to manage health-related needs will improve Outcome: Progressing   Problem: Clinical Measurements: Goal: Ability to maintain clinical measurements within normal limits will improve Outcome: Progressing Goal: Will remain free from infection Outcome: Progressing Goal: Diagnostic test results will improve Outcome: Progressing Goal: Respiratory complications will  improve Outcome: Progressing Goal: Cardiovascular complication will be avoided Outcome: Progressing   Problem: Activity: Goal: Risk for activity intolerance will decrease Outcome: Progressing   Problem: Nutrition: Goal: Adequate nutrition will be maintained Outcome: Progressing   Problem: Coping: Goal: Level of anxiety will decrease Outcome: Progressing   Problem: Elimination: Goal: Will not experience complications related to bowel motility Outcome: Progressing Goal: Will not experience complications related to urinary retention Outcome: Progressing   Problem: Pain Managment: Goal: General experience of comfort will improve Outcome: Progressing   Problem: Safety: Goal: Ability to remain free from injury will improve Outcome: Progressing   Problem: Skin Integrity: Goal: Risk for impaired skin integrity will decrease Outcome: Progressing

## 2022-03-03 NOTE — Evaluation (Signed)
Occupational Therapy Evaluation Patient Details Name: Dawn [name] MRN: [id number] DOB: Aug 14, 1941 Today's Date: 03/03/2022   History of Present Illness Dawn [name] is a 81 y.o. female with medical history significant of squamous cell lung carcinoma stage IIIb on the right side, IDDM, HTN, HLD, anxiety/depression, dementia with severe memory loss, COPD, presented with new onset chest pains.  Found to have stable R lung mass and T7 compression fracture as well as other suspicious signs of metastatic lesions.   Clinical Impression   Pt admitted with chest pain. Pt currently with functional limitations due to the deficits listed below (see OT Problem List).  Pt will benefit from skilled OT to increase their safety and independence with ADL and functional mobility for ADL to facilitate discharge to venue listed below.        Recommendations for follow up therapy are one component of a multi-disciplinary discharge planning process, led by the attending physician.  Recommendations may be updated based on patient status, additional functional criteria and insurance authorization.   Follow Up Recommendations  Skilled nursing-short term rehab (<3 hours/day)    Assistance Recommended at Discharge  Min A  Patient can return home with the following A little help with walking and/or transfers;A lot of help with bathing/dressing/bathroom    Functional Status Assessment  Patient has had a recent decline in their functional status and demonstrates the ability to make significant improvements in function in a reasonable and predictable amount of time.  Equipment Recommendations          Precautions / Restrictions Precautions Precautions: Fall Precaution Comments: watch HR/RR      Mobility Bed Mobility Overal bed mobility: Modified Independent                  Transfers Overall transfer level: Needs assistance Equipment used: Rolling walker (2 wheels) Transfers: Sit to/from Stand Sit to  Stand: Min assist           General transfer comment: increased time to rise and pt pulling up on walker despite cues to push from bed      Balance Overall balance assessment: Needs assistance   Sitting balance-Leahy Scale: Good       Standing balance-Leahy Scale: Fair Standing balance comment: able to stand for toilet hygiene and to pull up mesh panties                           ADL either performed or assessed with clinical judgement   ADL Overall ADL's : Needs assistance/impaired Eating/Feeding: Set up;Sitting   Grooming: Minimal assistance;Sitting   Upper Body Bathing: Set up;Sitting   Lower Body Bathing: Maximal assistance;Sit to/from stand;Cueing for safety;Cueing for sequencing   Upper Body Dressing : Set up;Sitting   Lower Body Dressing: Maximal assistance;Sit to/from stand   Toilet Transfer: Moderate assistance;Stand-pivot Toilet Transfer Details (indicate cue type and reason): bed to chair Toileting- Clothing Manipulation and Hygiene: Maximal assistance;Sit to/from stand;Sitting/lateral lean       Functional mobility during ADLs: Maximal assistance;+2 for physical assistance;+2 for safety/equipment;Cueing for safety;Cueing for sequencing       Vision   Vision Assessment?: No apparent visual deficits            Pertinent Vitals/Pain Pain Assessment Pain Score: 0-No pain     Hand Dominance     Extremity/Trunk Assessment Upper Extremity Assessment Upper Extremity Assessment: Generalized weakness   Lower Extremity Assessment Lower Extremity Assessment: RLE deficits/detail;LLE deficits/detail RLE Deficits /  Details: AROM WFL, strength hip flexion 3+/5, knee extension 4/5, ankle DF 4/5 with pain top of foot and noted errythema distal half of both feet LLE Deficits / Details: AROM WFL, strength hip flexion 3/5, knee extension 4/5, ankle DF 4/5  and noted errythema distal half of both feet   Cervical / Trunk Assessment Cervical / Trunk  Assessment: Kyphotic   Communication Communication Communication: HOH   Cognition Arousal/Alertness: Awake/alert Behavior During Therapy: WFL for tasks assessed/performed Overall Cognitive Status: No family/caregiver present to determine baseline cognitive functioning Area of Impairment: Orientation, Memory, Safety/judgement                 Orientation Level: Disoriented to, Place, Situation, Time   Memory: Decreased short-term memory   Safety/Judgement: Decreased awareness of safety           General Comments  SpO2 94% ambulating on RA, RR 24, HR max 127            Home Living Family/patient expects to be discharged to:: Private residence Living Arrangements: Alone Available Help at Discharge: Family;Available PRN/intermittently Type of Home: Other(Comment) (condo) Home Access: Stairs to enter Entrance Stairs-Number of Steps: 1   Home Layout: Two level;Able to live on main level with bedroom/bathroom Alternate Level Stairs-Number of Steps: stays on main level, sleeps on couch   Bathroom Shower/Tub: Teacher, early years/pre: Standard     Home Equipment: Shower seat;BSC/3in1;Grab bars - tub/shower          Prior Functioning/Environment Prior Level of Function : Independent/Modified Independent             Mobility Comments: reports not using walker at home and denies falls ADLs Comments: Reports completes all ADL/IADL's on her own (not reliable historian)        OT Problem List: Decreased strength;Impaired balance (sitting and/or standing)      OT Treatment/Interventions: Self-care/ADL training;Patient/family education;DME and/or AE instruction    OT Goals(Current goals can be found in the care plan section) Acute Rehab OT Goals Patient Stated Goal: get strongers OT Goal Formulation: With patient Time For Goal Achievement: 03/16/22 Potential to Achieve Goals: Good ADL Goals Pt Will Perform Grooming: with supervision;standing Pt  Will Perform Lower Body Dressing: with supervision;sit to/from stand Pt Will Transfer to Toilet: with min guard assist;bedside commode Pt Will Perform Toileting - Clothing Manipulation and hygiene: with min guard assist;sit to/from stand  OT Frequency: Min 2X/week       AM-PAC OT "6 Clicks" Daily Activity     Outcome Measure   Help from another person taking care of personal grooming?: A Little Help from another person toileting, which includes using toliet, bedpan, or urinal?: A Lot Help from another person bathing (including washing, rinsing, drying)?: A Lot Help from another person to put on and taking off regular upper body clothing?: A Little Help from another person to put on and taking off regular lower body clothing?: A Lot 6 Click Score: 12   End of Session Nurse Communication: Mobility status  Activity Tolerance: Patient tolerated treatment well Patient left: in chair;with call bell/phone within reach;with chair alarm set  OT Visit Diagnosis: Unsteadiness on feet (R26.81);Muscle weakness (generalized) (M62.81)                Time: 4034-7425 OT Time Calculation (min): 22 min Charges:  OT General Charges $OT Visit: 1 Visit OT Evaluation $OT Eval Low Complexity: [employer], OT Acute Rehabilitation Services [phone] Office-  [place]     Alexiah Koroma, Thereasa Parkin 03/03/2022, 12:49 PM

## 2022-03-03 NOTE — Progress Notes (Signed)
PROGRESS NOTE    Dawn [name]  IDP:[id number] DOB: 1940/09/20 DOA: 03/02/2022 PCP: Lawerance Cruel, MD    Brief Narrative:  81 year old with scoliosis carcinoma stage IIIb on the right side, IDDM, hypertension, anxiety depression and dementia, COPD presented to the emergency room with right-sided chest pain more pleuritic in nature.  In the emergency room afebrile, on room air.  CT angiogram negative for pulmonary embolism, stable right middle lobe lung mass, moderate right-sided pleural effusion, T7 vertebral body compression fracture and suspicious signs of metastatic lesions.  Oncology was consulted, they recommended steroids and radiation treatment.   Assessment & Plan:   Metastatic cancer pain, suspect metastatic lesions to pleural space, rib and sternum from squamous cell carcinoma: Previously treated with immunotherapy for lung cancer, currently off treatment.  Oncology determines patient is a poor candidate for chemotherapy due to dementia, poor functional status.  Palliative care consulted. Radiation oncology consulted for palliative radiation.  Adequate pain medications.  Continue IV steroids.  COPD: Without exacerbation.  Steroids, DuoNebs, Breo.  IDDM with hyperglycemia, uncontrolled with use of steroids.  Increase dose of insulin to cover for steroid-induced hyperglycemia.  She is not appropriate candidate for discharge with the insulin.  Essential hypertension: On carvedilol.   DVT prophylaxis: enoxaparin (LOVENOX) injection 40 mg Start: 03/02/22 1630   Code Status: Full code, palliative consulted Family Communication: None Disposition Plan: Status is: Inpatient Remains inpatient appropriate because: Inpatient procedures planned     Consultants:  Oncology Radiation oncology Palliative care team  Procedures:  None  Antimicrobials:  None   Subjective: Patient seen and examined.  Poor historian.  Complains of sternal pain on deep breathing and mobility.   No other other overnight events. She is craving for some sugar.  Objective: Vitals:   03/03/22 0220 03/03/22 0441 03/03/22 0849 03/03/22 0902  BP:  140/72 132/66   Pulse:  100 (!) 103   Resp:  20 20   Temp:  98.8 F (37.1 C) 97.6 F (36.4 C)   TempSrc:  Oral Oral   SpO2: 95% 94% 93% 91%  Weight:      Height:        Intake/Output Summary (Last 24 hours) at 03/03/2022 1248 Last data filed at 03/03/2022 0200 Gross per 24 hour  Intake --  Output 200 ml  Net -200 ml   Filed Weights   03/02/22 2029  Weight: 67.9 kg    Examination:  General exam: Appears calm and comfortable  Alert oriented x1-2.  Pleasant to conversation. Respiratory system: No added sounds. She does have some palpable tenderness along the sternum and right side of the chest wall. Cardiovascular system: S1 & S2 heard, RRR. No JVD, murmurs, rubs, gallops or clicks. No pedal edema. Gastrointestinal system: Abdomen is nondistended, soft and nontender. No organomegaly or masses felt. Normal bowel sounds heard.    Data Reviewed: I have personally reviewed following labs and imaging studies  CBC: Recent Labs  Lab 03/02/22 1107 03/02/22 1111  WBC 4.7  --   NEUTROABS 3.9  --   HGB 12.8 13.6  HCT 41.0 40.0  MCV 96.7  --   PLT 218  --    Basic Metabolic Panel: Recent Labs  Lab 03/02/22 1107 03/02/22 1111  NA 137 137  K 4.3 4.3  CL 102  --   CO2 20*  --   GLUCOSE 334*  --   BUN 26*  --   CREATININE 1.11*  --   CALCIUM 9.6  --  GFR: Estimated Creatinine Clearance: 38 mL/min (A) (by C-G formula based on SCr of 1.11 mg/dL (H)). Liver Function Tests: Recent Labs  Lab 03/02/22 1107  AST 21  ALT 11  ALKPHOS 110  BILITOT 0.8  PROT 6.8  ALBUMIN 3.6   No results for input(s): "LIPASE", "AMYLASE" in the last 168 hours. No results for input(s): "AMMONIA" in the last 168 hours. Coagulation Profile: No results for input(s): "INR", "PROTIME" in the last 168 hours. Cardiac Enzymes: No results  for input(s): "CKTOTAL", "CKMB", "CKMBINDEX", "TROPONINI" in the last 168 hours. BNP (last 3 results) No results for input(s): "PROBNP" in the last 8760 hours. HbA1C: Recent Labs    03/02/22 2307  HGBA1C 9.6*   CBG: Recent Labs  Lab 03/02/22 1746 03/02/22 2031 03/02/22 2157 03/03/22 0739 03/03/22 1156  GLUCAP 370* 350* 289* 276* 263*   Lipid Profile: No results for input(s): "CHOL", "HDL", "LDLCALC", "TRIG", "CHOLHDL", "LDLDIRECT" in the last 72 hours. Thyroid Function Tests: No results for input(s): "TSH", "T4TOTAL", "FREET4", "T3FREE", "THYROIDAB" in the last 72 hours. Anemia Panel: No results for input(s): "VITAMINB12", "FOLATE", "FERRITIN", "TIBC", "IRON", "RETICCTPCT" in the last 72 hours. Sepsis Labs: No results for input(s): "PROCALCITON", "LATICACIDVEN" in the last 168 hours.  Recent Results (from the past 240 hour(s))  Resp Panel by RT-PCR (Flu A&B, Covid) Anterior Nasal Swab     Status: None   Collection Time: 03/02/22 10:53 AM   Specimen: Anterior Nasal Swab  Result Value Ref Range Status   SARS Coronavirus 2 by RT PCR NEGATIVE NEGATIVE Final    Comment: (NOTE) SARS-CoV-2 target nucleic acids are NOT DETECTED.  The SARS-CoV-2 RNA is generally detectable in upper respiratory specimens during the acute phase of infection. The lowest concentration of SARS-CoV-2 viral copies this assay can detect is 138 copies/mL. A negative result does not preclude SARS-Cov-2 infection and should not be used as the sole basis for treatment or other patient management decisions. A negative result may occur with  improper specimen collection/handling, submission of specimen other than nasopharyngeal swab, presence of viral mutation(s) within the areas targeted by this assay, and inadequate number of viral copies(<138 copies/mL). A negative result must be combined with clinical observations, patient history, and epidemiological information. The expected result is Negative.  Fact  Sheet for Patients:  EntrepreneurPulse.com.au  Fact Sheet for Healthcare Providers:  IncredibleEmployment.be  This test is no t yet approved or cleared by the [place] FDA and  has been authorized for detection and/or diagnosis of SARS-CoV-2 by FDA under an Emergency Use Authorization (EUA). This EUA will remain  in effect (meaning this test can be used) for the duration of the COVID-19 declaration under Section 564(b)(1) of the Act, 21 U.S.C.section 360bbb-3(b)(1), unless the authorization is terminated  or revoked sooner.       Influenza A by PCR NEGATIVE NEGATIVE Final   Influenza B by PCR NEGATIVE NEGATIVE Final    Comment: (NOTE) The Xpert Xpress SARS-CoV-2/FLU/RSV plus assay is intended as an aid in the diagnosis of influenza from Nasopharyngeal swab specimens and should not be used as a sole basis for treatment. Nasal washings and aspirates are unacceptable for Xpert Xpress SARS-CoV-2/FLU/RSV testing.  Fact Sheet for Patients: EntrepreneurPulse.com.au  Fact Sheet for Healthcare Providers: IncredibleEmployment.be  This test is not yet approved or cleared by the [place] FDA and has been authorized for detection and/or diagnosis of SARS-CoV-2 by FDA under an Emergency Use Authorization (EUA). This EUA will remain in effect (meaning this test can be  used) for the duration of the COVID-19 declaration under Section 564(b)(1) of the Act, 21 U.S.C. section 360bbb-3(b)(1), unless the authorization is terminated or revoked.  Performed at [facility], [place] [address], [place], [place] [address]          Radiology Studies: CT Angio Chest PE W and/or Wo Contrast  Result Date: 03/02/2022 CLINICAL DATA:  Pulmonary embolism (PE) suspected, high prob. History of non-small cell lung cancer EXAM: CT ANGIOGRAPHY CHEST WITH CONTRAST TECHNIQUE: Multidetector CT imaging of the chest was performed  using the standard protocol during bolus administration of intravenous contrast. Multiplanar CT image reconstructions and MIPs were obtained to evaluate the vascular anatomy. RADIATION DOSE REDUCTION: This exam was performed according to the departmental dose-optimization program which includes automated exposure control, adjustment of the mA and/or kV according to patient size and/or use of iterative reconstruction technique. CONTRAST:  167mL OMNIPAQUE IOHEXOL 350 MG/ML SOLN COMPARISON:  Chest XR, 03/02/2022.  CT chest, 09/09/2021. FINDINGS: Cardiovascular: Satisfactory opacification of the pulmonary arteries to the segmental level. No evidence of segmental or larger pulmonary embolus. Normal heart size. No pericardial effusion. Mediastinum/Nodes: No enlarged mediastinal, hilar, or axillary lymph nodes. Thyroid gland, trachea, and esophagus demonstrate no significant findings. Lungs/Pleura: Increased volume of small volume RIGHT basilar pneumothorax. Small volume RIGHT pleural effusion, unchanged. Accounting for volume of inhalation, similar pulmonary findings with middle lobe mass measuring 3.2 x 2.5 cm (previously 3.3 x 2.5 cm), RIGHT perihilar bandlike opacity, calcified granulomas, and small RIGHT basilar nodular opacities. Background of mild underlying centrilobular emphysematous lung change with LEFT basilar pulmonary cyst and LEFT upper lobe anterior for parenchymal thickening. Upper Abdomen: No acute abnormality. Cholecystectomy. Splenic granulomas. Bilateral renal hypodense lesions, incompletely assessed on this evaluation though previously described as cysts. Musculoskeletal: LEFT breast lumpectomy changes. Progression of vertebral compression at T7 vertebral body, now with approximately 50% loss of height. T7 vertebral body sclerosis suspicious for osseous metastatic disease. Similar appearance of T12 chronic superior endplate compression deformity. Review of the MIP images confirms the above findings.  IMPRESSION: 1. No segmental or larger pulmonary embolus. 2. Similar pulmonary findings, accounting for volume of inhalation, with unchanged middle lobe mass, RIGHT perihilar opacity and basilar nodularities and small volume RIGHT pleural effusion within a background of mild centrilobular emphysema 3. Progression of T7 vertebral body compression, now with 50% loss of height. New T7 vertebral body sclerosis is suspicious for osseous metastatic disease given patient history. Electronically Signed   By: Michaelle Birks M.D.   On: 03/02/2022 14:41   DG Chest 1 View  Result Date: 03/02/2022 CLINICAL DATA:  [id number] chest pain and shortness of breath. History of smoking, COPD, COVID in 2022, breast cancer, diabetes and hypertension EXAM: CHEST  1 VIEW COMPARISON:  February 16, 2021 FINDINGS: Cardiomediastinal silhouette has a normal appearance. Left lung remains clear. There is some opacity seen at the right mid lung zone and at the right lung base with blunting of the CP angle due to pleural effusion and with superimposed likely atelectasis-pulmonary scarring. The visualized skeletal structures are unremarkable. IMPRESSION: Small right pleural effusion, right basilar atelectasis. Atelectasis and/or scarring at the right mid lung zone. These findings are more prominent in the present study. Electronically Signed   By: Frazier Richards M.D.   On: 03/02/2022 11:30        Scheduled Meds:  albuterol  2.5 mg Nebulization TID   arformoterol  15 mcg Nebulization BID   And   umeclidinium bromide  1 puff Inhalation  Daily   aspirin EC  81 mg Oral Daily   carvedilol  3.125 mg Oral BID WC   dexamethasone (DECADRON) injection  4 mg Intravenous Q12H   enoxaparin (LOVENOX) injection  40 mg Subcutaneous Q24H   ferrous sulfate  325 mg Oral BID   insulin aspart  0-20 Units Subcutaneous TID WC   insulin aspart  0-5 Units Subcutaneous QHS   insulin aspart  4 Units Subcutaneous TID WC   insulin glargine-yfgn  15 Units Subcutaneous  Daily   lidocaine  1 patch Transdermal Q24H   memantine  5 mg Oral Daily   multivitamin with minerals  1 tablet Oral Daily   pantoprazole  40 mg Oral QAC breakfast   Ensure Max Protein  11 oz Oral BID   rosuvastatin  40 mg Oral Daily   Continuous Infusions:   LOS: 1 day    Time spent: 35 minutes    Barb Merino, MD [facility] Pager [phone]

## 2022-03-03 NOTE — Consult Note (Addendum)
Radiation Oncology         [phone] ________________________________  Inpatient Consultation  Name: Dawn [name] MRN: [id number]  Date of Service: 03/02/2022 DOB: 1940/12/27  HQ:IONG, Dwyane Luo, MD  No ref. provider found   REFERRING PHYSICIAN: No ref. provider found  DIAGNOSIS: 81 y/o female with stage IV, NSCLC, squamous cell carcinoma with a new T7 vertebral body sclerosis and likely pathologic compression fracture, suspicious for osseous metastatic disease.    ICD-10-CM   1. Chest pain, unspecified type  R07.9     2. Closed fracture of seventh thoracic vertebra, unspecified fracture morphology, initial encounter ([place])  S22.069A     3. Squamous cell carcinoma of right lung (HCC)  C34.91       HISTORY OF PRESENT ILLNESS: Dawn [name] is a 81 y.o. female seen at the request of Dr. Earlie Server.  She is well-known to our service, having previously completed concurrent chemoradiation in August 2022 for T3,N2, M0, Stage IIIb, NSCLC, squamous cell carcinoma of the right middle lobe lung with involvement of a solitary subcarinal lymph node at diagnosis.  On disease restaging, she was noted to have a partial response and was started on consolidative immunotherapy with Imfinzi in October 2022.  She completed 3 cycles before she was lost to follow-up.  Most recently, the patient presented to the emergency department on 03/02/2022 with complaints of progressive shortness of breath and chest pain, ongoing for the better part of a month.  She reports that the pain initially would resolve with Tylenol but more recently was only minimally responsive to tramadol.  A CT angio chest was performed on admission to rule out pulmonary embolism.  There was no evidence of PE and the right middle lobe lung mass appeared to be stable with a small volume right pleural effusion but there was progression of a T7 vertebral body compression fracture and new T7 vertebral body sclerosis, suspicious for osseous  metastatic disease.  She was started on Decadron 4 mg twice daily.  Of note, the patient does have significant dementia but continues to live independently and drives herself around.  We have been asked to consult the patient for consideration of palliative radiotherapy to the painful osseous metastasis at T7.  PREVIOUS RADIATION THERAPY: Yes  02/13/21 - 04/23/21:  The primary tumor and involved mediastinal adenopathy were treated to 66 Gy in 33 fractions of 2 Gy. (Dr. Tammi Klippel)  11/18/15- 12/16/15:  Left breast treated to 40.5 Gy in 15 fractions, Left breast boost treated to 10 Gy in 5 fractions (Dr. Isidore Moos)  PAST MEDICAL HISTORY:  Past Medical History:  Diagnosis Date   Anemia    Anxiety    Arthritis    Breast cancer of upper-outer quadrant of left female breast ([place]) 08/18/2015   Broken wrist    when younger x2   COPD (chronic obstructive pulmonary disease) ([place])    Hospitalization March 2011   COVID-19 01/22/2021   Dementia ([place])    Depression    Diabetes mellitus without complication ([place])    type 2   Dyspnea    Full dentures    H/O vaginal delivery 1962   x1    High cholesterol    History of home oxygen therapy    Hypertension    Memory changes    sharp decline over the last 3 months, per son as of 01/22/21,    Pneumonia 01/22/2021   Radiation    11/18/15- 12/16/15 to her Left Breast  PAST SURGICAL HISTORY: Past Surgical History:  Procedure Laterality Date   ABDOMINAL HYSTERECTOMY     APPENDECTOMY     BRONCHIAL BIOPSY  02/16/2021   Procedure: BRONCHIAL BIOPSIES;  Surgeon: Collene Gobble, MD;  Location: [facility];  Service: Pulmonary;;   BRONCHIAL BRUSHINGS  02/16/2021   Procedure: BRONCHIAL BRUSHINGS;  Surgeon: Collene Gobble, MD;  Location: [facility];  Service: Pulmonary;;   BRONCHIAL NEEDLE ASPIRATION BIOPSY  02/16/2021   Procedure: BRONCHIAL NEEDLE ASPIRATION BIOPSIES;  Surgeon: Collene Gobble, MD;  Location: [facility];  Service: Pulmonary;;   CHOLECYSTECTOMY      EYE SURGERY     cataracts   RADIOACTIVE SEED GUIDED PARTIAL MASTECTOMY WITH AXILLARY SENTINEL LYMPH NODE BIOPSY Left 09/17/2015   Procedure: RADIOACTIVE SEED LOCALIZATION LEFT BREAST LUMPECTOMY AND LEFT AXILLARY SENTINEL LYMPH NODE BIOPSY;  Surgeon: Excell Seltzer, MD;  Location: [facility];  Service: General;  Laterality: Left;   TONSILLECTOMY     VIDEO BRONCHOSCOPY WITH ENDOBRONCHIAL NAVIGATION Right 02/16/2021   Procedure: VIDEO BRONCHOSCOPY WITH ENDOBRONCHIAL NAVIGATION;  Surgeon: Collene Gobble, MD;  Location: [facility];  Service: Pulmonary;  Laterality: Right;    FAMILY HISTORY:  Family History  Problem Relation Age of Onset   Colon cancer Sister    Lung cancer Sister     SOCIAL HISTORY:  Social History   Socioeconomic History   Marital status: Legally Separated    Spouse name: Not on file   Number of children: Not on file   Years of education: Not on file   Highest education level: Not on file  Occupational History   Not on file  Tobacco Use   Smoking status: Former    Packs/day: 1.00    Years: 70.00    Total pack years: 70.00    Types: Cigarettes   Smokeless tobacco: Never   Tobacco comments:    quit 5 weeks ago ARJ 02/11/21  Vaping Use   Vaping Use: Never used  Substance and Sexual Activity   Alcohol use: Not Currently    Comment: rare   Drug use: No   Sexual activity: Not on file    Comment: Hysterectomy  Other Topics Concern   Not on file  Social History Narrative   Not on file   Social Determinants of Health   Financial Resource Strain: Not on file  Food Insecurity: Not on file  Transportation Needs: Not on file  Physical Activity: Not on file  Stress: Not on file  Social Connections: Not on file  Intimate Partner Violence: Not At Risk (03/09/2021)   Humiliation, Afraid, Rape, and Kick questionnaire    Fear of Current or Ex-Partner: No    Emotionally Abused: No    Physically Abused: No    Sexually Abused: No    ALLERGIES: Ferumoxytol,  Pneumococcal vaccines, and Repaglinide  MEDICATIONS:  Current Facility-Administered Medications  Medication Dose Route Frequency Provider Last Rate Last Admin   albuterol (PROVENTIL) (2.5 MG/3ML) 0.083% nebulizer solution 2.5 mg  2.5 mg Nebulization TID [place], Ping T, MD   2.5 mg at 03/03/22 0902   albuterol (PROVENTIL) (2.5 MG/3ML) 0.083% nebulizer solution 3 mL  3 mL Inhalation Q2H PRN Wynetta Fines T, MD       arformoterol [facility]) nebulizer solution 15 mcg  15 mcg Nebulization BID Wynetta Fines T, MD   15 mcg at 03/03/22 0902   And   umeclidinium bromide (INCRUSE ELLIPTA) 62.5 MCG/ACT 1 puff  1 puff Inhalation Daily Lequita Halt, MD  aspirin EC tablet 81 mg  81 mg Oral Daily Wynetta Fines T, MD   81 mg at 03/03/22 1020   bisacodyl (DULCOLAX) EC tablet 5 mg  5 mg Oral Daily PRN Wynetta Fines T, MD       carvedilol (COREG) tablet 3.125 mg  3.125 mg Oral BID WC Wynetta Fines T, MD   3.125 mg at 03/03/22 0849   dexamethasone (DECADRON) injection 4 mg  4 mg Intravenous Q12H Regan Lemming, MD   4 mg at 03/03/22 1020   enoxaparin (LOVENOX) injection 40 mg  40 mg Subcutaneous Q24H Wynetta Fines T, MD   40 mg at 03/02/22 1649   ferrous sulfate tablet 325 mg  325 mg Oral BID Wynetta Fines T, MD   325 mg at 03/03/22 0848   HYDROmorphone (DILAUDID) injection 0.5 mg  0.5 mg Intravenous Q4H PRN Wynetta Fines T, MD       insulin aspart (novoLOG) injection 0-20 Units  0-20 Units Subcutaneous TID WC Lequita Halt, MD   11 Units at 03/03/22 0847   insulin aspart (novoLOG) injection 0-5 Units  0-5 Units Subcutaneous QHS Wynetta Fines T, MD   3 Units at 03/02/22 2205   insulin aspart (novoLOG) injection 4 Units  4 Units Subcutaneous TID WC Barb Merino, MD       insulin glargine-yfgn (SEMGLEE) injection 15 Units  15 Units Subcutaneous Daily Barb Merino, MD   15 Units at 03/03/22 1120   lidocaine (LIDODERM) 5 % 1 patch  1 patch Transdermal Q24H Regan Lemming, MD   1 patch at 03/02/22 1530   memantine (NAMENDA)  tablet 5 mg  5 mg Oral Daily Wynetta Fines T, MD   5 mg at 03/03/22 1020   oxyCODONE (Oxy IR/ROXICODONE) immediate release tablet 5 mg  5 mg Oral Q6H PRN Wynetta Fines T, MD   5 mg at 03/03/22 0237   pantoprazole (PROTONIX) EC tablet 40 mg  40 mg Oral QAC breakfast Wynetta Fines T, MD   40 mg at 03/03/22 0849   prochlorperazine (COMPAZINE) tablet 10 mg  10 mg Oral Q6H PRN Wynetta Fines T, MD   10 mg at 03/03/22 0237   rosuvastatin (CRESTOR) tablet 40 mg  40 mg Oral Daily Wynetta Fines T, MD   40 mg at 03/03/22 1021    REVIEW OF SYSTEMS:  On review of systems, the patient reports that she is doing fair overall.  She continues with mid to upper back/chest pain that originates substernal, between the breasts and radiates out laterally to the left although she has had some right-sided pain since this morning.  The left-sided back/chest pain has significantly improved since starting steroids.  She also continues with shortness of breath, but denies any productive cough, hemoptysis, fevers, chills, night sweats, or unintended weight changes.  She denies any bowel or bladder disturbances, and denies abdominal pain, nausea or vomiting.  She has not had any focal weakness or paresthesias in the upper or lower extremities and denies any other musculoskeletal or joint aches or pains. A complete review of systems is obtained and is otherwise negative.    PHYSICAL EXAM:  Wt Readings from Last 3 Encounters:  03/02/22 149 lb 11.1 oz (67.9 kg)  08/17/21 117 lb 4 oz (53.2 kg)  07/20/21 113 lb 8 oz (51.5 kg)   Temp Readings from Last 3 Encounters:  03/03/22 97.6 F (36.4 C) (Oral)  08/17/21 (!) 97.2 F (36.2 C) (Tympanic)  07/20/21 97.9 F (36.6 C) (Tympanic)  BP Readings from Last 3 Encounters:  03/03/22 132/66  08/17/21 131/65  07/20/21 (!) 144/73   Pulse Readings from Last 3 Encounters:  03/03/22 (!) 103  08/17/21 98  08/17/21 (!) 107   Pain Assessment Pain Score: Asleep/10 In general this is a well  appearing Caucasian female in no acute distress.  She's alert and oriented x4 and appropriate throughout the examination. Cardiopulmonary assessment is negative for acute distress and she exhibits normal effort.  She remains neurologically intact with 5/5 strength bilaterally upper and lower extremities with intact sensation to light touch bilaterally of the lower extremities.   KPS = 90  100 - Normal; no complaints; no evidence of disease. 90   - Able to carry on normal activity; minor signs or symptoms of disease. 80   - Normal activity with effort; some signs or symptoms of disease. 45   - Cares for self; unable to carry on normal activity or to do active work. 60   - Requires occasional assistance, but is able to care for most of his personal needs. 50   - Requires considerable assistance and frequent medical care. 17   - Disabled; requires special care and assistance. 55   - Severely disabled; hospital admission is indicated although death not imminent. 9   - Very sick; hospital admission necessary; active supportive treatment necessary. 10   - Moribund; fatal processes progressing rapidly. 0     - Dead  Karnofsky DA, Abelmann [place], Craver LS and Burchenal JH [phone]) The use of the nitrogen mustards in the palliative treatment of carcinoma: with particular reference to bronchogenic carcinoma Cancer 1 634-56  LABORATORY DATA:  Lab Results  Component Value Date   WBC 4.7 03/02/2022   HGB 13.6 03/02/2022   HCT 40.0 03/02/2022   MCV 96.7 03/02/2022   PLT 218 03/02/2022   Lab Results  Component Value Date   NA 137 03/02/2022   K 4.3 03/02/2022   CL 102 03/02/2022   CO2 20 (L) 03/02/2022   Lab Results  Component Value Date   ALT 11 03/02/2022   AST 21 03/02/2022   ALKPHOS 110 03/02/2022   BILITOT 0.8 03/02/2022     RADIOGRAPHY: CT Angio Chest PE W and/or Wo Contrast  Result Date: 03/02/2022 CLINICAL DATA:  Pulmonary embolism (PE) suspected, high prob. History of non-small  cell lung cancer EXAM: CT ANGIOGRAPHY CHEST WITH CONTRAST TECHNIQUE: Multidetector CT imaging of the chest was performed using the standard protocol during bolus administration of intravenous contrast. Multiplanar CT image reconstructions and MIPs were obtained to evaluate the vascular anatomy. RADIATION DOSE REDUCTION: This exam was performed according to the departmental dose-optimization program which includes automated exposure control, adjustment of the mA and/or kV according to patient size and/or use of iterative reconstruction technique. CONTRAST:  120mL OMNIPAQUE IOHEXOL 350 MG/ML SOLN COMPARISON:  Chest XR, 03/02/2022.  CT chest, 09/09/2021. FINDINGS: Cardiovascular: Satisfactory opacification of the pulmonary arteries to the segmental level. No evidence of segmental or larger pulmonary embolus. Normal heart size. No pericardial effusion. Mediastinum/Nodes: No enlarged mediastinal, hilar, or axillary lymph nodes. Thyroid gland, trachea, and esophagus demonstrate no significant findings. Lungs/Pleura: Increased volume of small volume RIGHT basilar pneumothorax. Small volume RIGHT pleural effusion, unchanged. Accounting for volume of inhalation, similar pulmonary findings with middle lobe mass measuring 3.2 x 2.5 cm (previously 3.3 x 2.5 cm), RIGHT perihilar bandlike opacity, calcified granulomas, and small RIGHT basilar nodular opacities. Background of mild underlying centrilobular emphysematous lung change with LEFT basilar pulmonary cyst  and LEFT upper lobe anterior for parenchymal thickening. Upper Abdomen: No acute abnormality. Cholecystectomy. Splenic granulomas. Bilateral renal hypodense lesions, incompletely assessed on this evaluation though previously described as cysts. Musculoskeletal: LEFT breast lumpectomy changes. Progression of vertebral compression at T7 vertebral body, now with approximately 50% loss of height. T7 vertebral body sclerosis suspicious for osseous metastatic disease. Similar  appearance of T12 chronic superior endplate compression deformity. Review of the MIP images confirms the above findings. IMPRESSION: 1. No segmental or larger pulmonary embolus. 2. Similar pulmonary findings, accounting for volume of inhalation, with unchanged middle lobe mass, RIGHT perihilar opacity and basilar nodularities and small volume RIGHT pleural effusion within a background of mild centrilobular emphysema 3. Progression of T7 vertebral body compression, now with 50% loss of height. New T7 vertebral body sclerosis is suspicious for osseous metastatic disease given patient history. Electronically Signed   By: Michaelle Birks M.D.   On: 03/02/2022 14:41   DG Chest 1 View  Result Date: 03/02/2022 CLINICAL DATA:  [id number] chest pain and shortness of breath. History of smoking, COPD, COVID in 2022, breast cancer, diabetes and hypertension EXAM: CHEST  1 VIEW COMPARISON:  February 16, 2021 FINDINGS: Cardiomediastinal silhouette has a normal appearance. Left lung remains clear. There is some opacity seen at the right mid lung zone and at the right lung base with blunting of the CP angle due to pleural effusion and with superimposed likely atelectasis-pulmonary scarring. The visualized skeletal structures are unremarkable. IMPRESSION: Small right pleural effusion, right basilar atelectasis. Atelectasis and/or scarring at the right mid lung zone. These findings are more prominent in the present study. Electronically Signed   By: Frazier Richards M.D.   On: 03/02/2022 11:30      IMPRESSION/PLAN: 1. 81 y.o. with stage IV, NSCLC, squamous cell carcinoma with a new T7 vertebral body sclerosis and likely pathologic compression fracture, suspicious for osseous metastatic disease. Today, I talked to the patient and her son, [name], about the findings and workup thus far. We discussed the natural history of metastatic NSCLC and general treatment, highlighting the role of palliative radiotherapy in the management of painful,  osseous metastasis. We discussed the available radiation techniques, and focused on the details and logistics of delivery.  The recommendation is for a 2-week course of daily, palliative radiotherapy to the painful sites of disease.  We reviewed the anticipated acute and late sequelae associated with radiation in this setting. The patient was encouraged to ask questions that were answered to her stated satisfaction.  At the conclusion of our conversation, the patient is in agreement to proceed with the recommended 2-week course of daily, palliative radiotherapy to the painful sites of disease.  I also spoke with the patient's son, [name], by telephone to ensure that he is in the loop and in agreement, given the patient's significant dementia.  They are both in agreement and provided verbal consent to proceed so we will plan to bring her downstairs for CT simulation/treatment planning this afternoon, in anticipation of beginning her daily treatments on Thursday, 03/04/2022.  They understand that if she makes significant progress regarding pain control and is deemed stable for discharge home prior to the completion of treatment, we can certainly help to make arrangements to continue her treatments on an outpatient basis.  We also briefly discussed the potential role for OsteoCool with Kyphoplasty depending on how much pain relief she gets with the radiation alone.  We can revisit that conversation once we see how she responds to the palliative  radiation.  She and her son appear to have a good understanding of her disease and our treatment recommendations and are comfortable and in agreement with the stated plan.  She will sign written consent to proceed at the time of her CT simulation this afternoon and a copy of this document will be placed in her medical record.  We enjoyed meeting with her again today and look forward to continuing to participate in her care.  I personally spent 70 minutes in this encounter  including chart review, reviewing radiological studies, meeting face-to-face with the patient, entering orders and completing documentation.    Nicholos Johns, PA-C    Tyler Pita, MD  [facility] Direct Dial: [phone]  Fax: [phone] [email or web address]  Skype  LinkedIn

## 2022-03-03 NOTE — Consult Note (Cosign Needed)
Consultation Note Date: 03/03/2022   Patient Name: Dawn [name]  DOB: [birth year]  MRN: [id number]  Age / Sex: 81 y.o., female  PCP: Lawerance Cruel, MD Referring Physician: Barb Merino, MD  Reason for Consultation: Establishing goals of care  HPI/Patient Profile: 81 y.o. female  with past medical history of dementia, right-sided squamous cell lung carcinoma stage 3b, diabetes, HTN, HLD, anxiety/depression admitted on 03/02/2022 with chest pain with evidence of mets to T7 on steroids and with plans to pursue radiation therapy.   Clinical Assessment and Goals of Care: I met today with Ms. Dawn [name] but no family at bedside. Records reviewed and noted for stage 3b lung cancer s/p systemic therapy and radiation therapy but then lost to follow up per oncology telephone records in chart. Noted underlying memory impairment. [name] at time of my meeting knows she has plans for radiation but unclear on the reason or expectation of this treatment. She knows that she has cancer but does not understand that this has progressed and what this means. She is was unclear on any plans for follow up with oncology in the past and unaware of missed appointments or needed follow up. She does tells me that she does not want to die. She tells me about her family members that have lived well into their 41s and how she is hoping to surpass them all. She tells me that she wants all measures to keep her alive as long as possible.   However, when I speak more with [name] about progression of disease and how this can lead to being bed-bound, weak, and poor quality of life she is not sure that she would want her life prolonged in that state. She tells me "well that's a different story." She confirms that her son would be her surrogate decision maker but that they have not had any of these conversations. She tells me that she has a Living Will  that she completed many years ago but unclear what this speaks too. She is open to radiation therapy and rehab stay if indicated and at this time all measures to prolong life. She gives me permission to call and speak with her son.   I called and spoke briefly with son, Dawn [name]. He is at work but requests meeting Friday 1pm. He is anticipating further discussion regarding resources and potential hospice at that time.   All questions/concerns addressed. Emotional support provided.   Primary Decision Maker NEXT OF KIN patient may add to goals but understanding limited by her underlying cognitive impairment    SUMMARY OF RECOMMENDATIONS   - Family meeting Friday 1 pm  Code Status/Advance Care Planning: Full code   Symptom Management:  Per attending, radiation oncology. Continue steroids and radiation therapy. She was able to get up and to wheelchair with minimal assistance and pain today.   Palliative Prophylaxis:  Bowel Regimen, Delirium Protocol, Frequent Pain Assessment, and Turn Reposition  Prognosis:  Overall prognosis poor with advanced cancer.   Discharge Planning: To Be Determined  Primary Diagnoses: Present on Admission: **None**   I have reviewed the medical record, interviewed the patient and family, and examined the patient. The following aspects are pertinent.  Past Medical History:  Diagnosis Date   Anemia    Anxiety    Arthritis    Breast cancer of upper-outer quadrant of left female breast ([place]) 08/18/2015   Broken wrist    when younger x2   COPD (chronic obstructive pulmonary disease) ([place])    Hospitalization March 2011   COVID-19 01/22/2021   Dementia ([place])    Depression    Diabetes mellitus without complication ([place])    type 2   Dyspnea    Full dentures    H/O vaginal delivery 1962   x1    High cholesterol    History of home oxygen therapy    Hypertension    Memory changes    sharp decline over the last 3 months, per son as of 01/22/21,     Pneumonia 01/22/2021   Radiation    11/18/15- 12/16/15 to her Left Breast   Social History   Socioeconomic History   Marital status: Legally Separated    Spouse name: Not on file   Number of children: Not on file   Years of education: Not on file   Highest education level: Not on file  Occupational History   Not on file  Tobacco Use   Smoking status: Former    Packs/day: 1.00    Years: 70.00    Total pack years: 70.00    Types: Cigarettes   Smokeless tobacco: Never   Tobacco comments:    quit 5 weeks ago ARJ 02/11/21  Vaping Use   Vaping Use: Never used  Substance and Sexual Activity   Alcohol use: Not Currently    Comment: rare   Drug use: No   Sexual activity: Not on file    Comment: Hysterectomy  Other Topics Concern   Not on file  Social History Narrative   Not on file   Social Determinants of Health   Financial Resource Strain: Not on file  Food Insecurity: Not on file  Transportation Needs: Not on file  Physical Activity: Not on file  Stress: Not on file  Social Connections: Not on file   Family History  Problem Relation Age of Onset   Colon cancer Sister    Lung cancer Sister    Scheduled Meds:  albuterol  2.5 mg Nebulization TID   arformoterol  15 mcg Nebulization BID   And   umeclidinium bromide  1 puff Inhalation Daily   aspirin EC  81 mg Oral Daily   carvedilol  3.125 mg Oral BID WC   dexamethasone (DECADRON) injection  4 mg Intravenous Q12H   enoxaparin (LOVENOX) injection  40 mg Subcutaneous Q24H   ferrous sulfate  325 mg Oral BID   insulin aspart  0-20 Units Subcutaneous TID WC   insulin aspart  0-5 Units Subcutaneous QHS   insulin aspart  4 Units Subcutaneous TID WC   insulin glargine-yfgn  15 Units Subcutaneous Daily   lidocaine  1 patch Transdermal Q24H   memantine  5 mg Oral Daily   multivitamin with minerals  1 tablet Oral Daily   pantoprazole  40 mg Oral QAC breakfast   Ensure Max Protein  11 oz Oral BID   rosuvastatin  40 mg Oral  Daily   Continuous Infusions: PRN Meds:.albuterol, bisacodyl, HYDROmorphone (DILAUDID) injection, oxyCODONE, prochlorperazine Allergies  Allergen Reactions   Ferumoxytol Shortness Of  Breath, Itching and Other (See Comments)    Pt reported lightheadedness, a flushed feeling, SOB, and itching   Pneumococcal Vaccines Hives, Swelling and Other (See Comments)    Arm became swollen   Repaglinide Other (See Comments)    Dropped BGL too low   Review of Systems  Unable to perform ROS: Dementia  Constitutional:  Positive for activity change. Negative for appetite change.  Respiratory:  Negative for shortness of breath.   Cardiovascular:  Positive for chest pain.    Physical Exam Vitals and nursing note reviewed.  Constitutional:      Appearance: She is ill-appearing.  Cardiovascular:     Rate and Rhythm: Tachycardia present.  Pulmonary:     Effort: No tachypnea, accessory muscle usage or respiratory distress.  Abdominal:     Palpations: Abdomen is soft.  Neurological:     Mental Status: She is alert.     Comments: Oriented to person and place and with some overall appropriate responses to her situation but forgetful and poor understanding and memory of the details of her disease and treatment plan and expectations     Vital Signs: BP 132/66 (BP Location: Left Arm)   Pulse (!) 103   Temp 97.6 F (36.4 C) (Oral)   Resp 20   Ht '5\' 3"'  (1.6 m)   Wt 67.9 kg   SpO2 91%   BMI 26.52 kg/m  Pain Scale: 0-10   Pain Score: Asleep   SpO2: SpO2: 91 % O2 Device:SpO2: 91 % O2 Flow Rate: .   IO: Intake/output summary:  Intake/Output Summary (Last 24 hours) at 03/03/2022 1318 Last data filed at 03/03/2022 0200 Gross per 24 hour  Intake --  Output 200 ml  Net -200 ml    LBM: Last BM Date :  (uknown) Baseline Weight: Weight: 67.9 kg Most recent weight: Weight: 67.9 kg     Palliative Assessment/Data:     Time In: 1315  Time Total: 65 min  Greater than 50%  of this time was  spent counseling and coordinating care related to the above assessment and plan.  Signed by: Vinie Sill, NP Palliative Medicine Team Pager # [phone] (M-F 8a-5p) Team Phone # [phone] (Nights/Weekends)

## 2022-03-03 NOTE — Progress Notes (Signed)
Initial Nutrition Assessment  INTERVENTION:   -Ensure MAX Protein po BID, each supplement provides 150 kcal and 30 grams of protein   -Multivitamin with minerals daily  NUTRITION DIAGNOSIS:   Increased nutrient needs related to cancer and cancer related treatments as evidenced by estimated needs.  GOAL:   Patient will meet greater than or equal to 90% of their needs  MONITOR:   PO intake, Supplement acceptance, Labs, Weight trends, I & O's  REASON FOR ASSESSMENT:   Consult Assessment of nutrition requirement/status  ASSESSMENT:   81 y.o. female with medical history significant of squamous cell lung carcinoma stage IIIb on the right side, IDDM, HTN, HLD, anxiety/depression, dementia with severe memory loss, COPD, presented with new onset chest pains.  Pt not available d/t working with therapies. Will attempt to see patient at a later time. Per chart review, pt with dementia, memory loss, history of lung cancer in which pt was not compliant with treatments so was discharged from oncology services. Was last seen by [facility] in August 2022. Was deemed to not be a candidate for systemic treatments per Oncology note on 6/20. Pt expected to have emergent radiation this admission.   Will order Ensure Max supplements given elevated CBGs.   Per weight records, pt's weight has increased since December 2022. Current weight: 149 lbs  Medications: Ferrous sulfate, Compazine  Labs reviewed:  CBGs: 263-350  NUTRITION - FOCUSED PHYSICAL EXAM:  Unable to complete at this time.  Diet Order:   Diet Order             Diet Heart Room service appropriate? Yes; Fluid consistency: Thin  Diet effective now                   EDUCATION NEEDS:   Not appropriate for education at this time  Skin:  Skin Assessment: Reviewed RN Assessment  Last BM:  PTA  Height:   Ht Readings from Last 1 Encounters:  03/02/22 5\' 3"  (1.6 m)    Weight:   Wt Readings from Last 1  Encounters:  03/02/22 67.9 kg    BMI:  Body mass index is 26.52 kg/m.  Estimated Nutritional Needs:   Kcal:  1700-1900  Protein:  80-90g  Fluid:  1.9L/day  Clayton Bibles, MS, RD, LDN Inpatient Clinical Dietitian Contact information available via Amion

## 2022-03-03 NOTE — Evaluation (Signed)
Physical Therapy Evaluation Patient Details Name: Dawn [name] MRN: [id number] DOB: May 17, 1941 Today's Date: 03/03/2022  History of Present Illness  Dawn [name] is a 81 y.o. female with medical history significant of squamous cell lung carcinoma stage IIIb on the right side, IDDM, HTN, HLD, anxiety/depression, dementia with severe memory loss, COPD, presented with new onset chest pains.  Found to have stable R lung mass and T7 compression fracture as well as other suspicious signs of metastatic lesions.  Clinical Impression  Patient presents with decreased mobility due to deficits listed in PT problem list.  Currently minguard for ambulation in hallway with walker and min A for tranfers with poor safety awareness (declined slipper socks since feet were hot and wide BOS hitting toes on walker.)  She was living alone and per her report completing ADL/IADL's but demonstrates cognitive deficits concerning for safety at home alone.  She walked on RA with SpO2 94%, but HR up to 127 and RR 24 with increased time to recover.  Feel she will benefit from skilled PT in the acute setting and from STSNF level rehab at d/c.  Recommend resources to be given to son for future ALF placement.        Recommendations for follow up therapy are one component of a multi-disciplinary discharge planning process, led by the attending physician.  Recommendations may be updated based on patient status, additional functional criteria and insurance authorization.  Follow Up Recommendations Skilled nursing-short term rehab (<3 hours/day) Can patient physically be transported by private vehicle: Yes    Assistance Recommended at Discharge Frequent or constant Supervision/Assistance  Patient can return home with the following  A little help with walking and/or transfers;A little help with bathing/dressing/bathroom;Direct supervision/assist for financial management;Direct supervision/assist for medications management;Assistance with  cooking/housework;Assist for transportation;Help with stairs or ramp for entrance    Equipment Recommendations Rolling walker (2 wheels)  Recommendations for Other Services       Functional Status Assessment Patient has had a recent decline in their functional status and demonstrates the ability to make significant improvements in function in a reasonable and predictable amount of time.     Precautions / Restrictions Precautions Precautions: Fall Precaution Comments: watch HR/RR      Mobility  Bed Mobility Overal bed mobility: Modified Independent                  Transfers Overall transfer level: Needs assistance Equipment used: Rolling walker (2 wheels) Transfers: Sit to/from Stand Sit to Stand: Min assist           General transfer comment: increased time to rise and pt pulling up on walker despite cues to push from bed    Ambulation/Gait Ambulation/Gait assistance: Min guard Gait Distance (Feet): 80 Feet (&120') Assistive device: Rolling walker (2 wheels) Gait Pattern/deviations: Step-through pattern, Decreased stride length, Trunk flexed, Wide base of support       General Gait Details: walked to bathroom then in hallway with RW, pt had declined to wear slipper socks so with wide BOS pt c/o hitting her toes with walker on two occasions; assist for balance/safety and cues for posture/walker proximity  Stairs            Wheelchair Mobility    Modified Rankin (Stroke Patients Only)       Balance Overall balance assessment: Needs assistance   Sitting balance-Leahy Scale: Good       Standing balance-Leahy Scale: Fair Standing balance comment: able to stand for toilet hygiene and  to pull up mesh panties                             Pertinent Vitals/Pain Pain Assessment Pain Assessment: Faces Faces Pain Scale: Hurts little more Pain Location: chest between breasts Pain Descriptors / Indicators: Sharp Pain Intervention(s):  Monitored during session, Patient requesting pain meds-RN notified    Home Living Family/patient expects to be discharged to:: Private residence Living Arrangements: Alone Available Help at Discharge: Family;Available PRN/intermittently Type of Home: Other(Comment) (condo) Home Access: Stairs to enter   Entrance Stairs-Number of Steps: 1 Alternate Level Stairs-Number of Steps: stays on main level, sleeps on couch Home Layout: Two level;Able to live on main level with bedroom/bathroom Home Equipment: Shower seat;BSC/3in1;Grab bars - tub/shower      Prior Function Prior Level of Function : Independent/Modified Independent             Mobility Comments: reports not using walker at home and denies falls ADLs Comments: Reports completes all ADL/IADL's on her own (not reliable historian)     Hand Dominance        Extremity/Trunk Assessment   Upper Extremity Assessment Upper Extremity Assessment: Defer to OT evaluation    Lower Extremity Assessment Lower Extremity Assessment: RLE deficits/detail;LLE deficits/detail RLE Deficits / Details: AROM WFL, strength hip flexion 3+/5, knee extension 4/5, ankle DF 4/5 with pain top of foot and noted errythema distal half of both feet LLE Deficits / Details: AROM WFL, strength hip flexion 3/5, knee extension 4/5, ankle DF 4/5  and noted errythema distal half of both feet    Cervical / Trunk Assessment Cervical / Trunk Assessment: Kyphotic  Communication   Communication: HOH  Cognition Arousal/Alertness: Awake/alert Behavior During Therapy: WFL for tasks assessed/performed Overall Cognitive Status: No family/caregiver present to determine baseline cognitive functioning Area of Impairment: Orientation, Memory, Safety/judgement                 Orientation Level: Disoriented to, Place, Situation, Time   Memory: Decreased short-term memory   Safety/Judgement: Decreased awareness of safety              General Comments  General comments (skin integrity, edema, etc.): SpO2 94% ambulating on RA, RR 24, HR max 127    Exercises     Assessment/Plan    PT Assessment Patient needs continued PT services  PT Problem List Decreased strength;Decreased safety awareness;Decreased mobility;Decreased activity tolerance;Decreased balance;Cardiopulmonary status limiting activity;Decreased knowledge of use of DME       PT Treatment Interventions DME instruction;Therapeutic activities;Therapeutic exercise;Patient/family education;Gait training;Balance training;Functional mobility training    PT Goals (Current goals can be found in the Care Plan section)  Acute Rehab PT Goals Patient Stated Goal: agreeable to STSNF PT Goal Formulation: With patient Time For Goal Achievement: 03/17/22 Potential to Achieve Goals: Good    Frequency Min 3X/week     Co-evaluation               AM-PAC PT "6 Clicks" Mobility  Outcome Measure Help needed turning from your back to your side while in a flat bed without using bedrails?: None Help needed moving from lying on your back to sitting on the side of a flat bed without using bedrails?: None Help needed moving to and from a bed to a chair (including a wheelchair)?: A Little Help needed standing up from a chair using your arms (e.g., wheelchair or bedside chair)?: A Little Help needed to walk in  hospital room?: A Little Help needed climbing 3-5 steps with a railing? : Total 6 Click Score: 18    End of Session Equipment Utilized During Treatment: Gait belt Activity Tolerance: Patient limited by fatigue Patient left: in bed;with call bell/phone within reach;with bed alarm set   PT Visit Diagnosis: Other abnormalities of gait and mobility (R26.89);Muscle weakness (generalized) (M62.81)    Time: 1019-1050 PT Time Calculation (min) (ACUTE ONLY): 31 min   Charges:   PT Evaluation $PT Eval Moderate Complexity: 1 Mod PT Treatments $Gait Training: 8-22 mins         Dawn [name], PT Acute Rehabilitation Services DSWVT:[phone] Office:[phone] 03/03/2022   Dawn [name] 03/03/2022, 11:30 AM

## 2022-03-04 ENCOUNTER — Other Ambulatory Visit: Payer: Self-pay

## 2022-03-04 ENCOUNTER — Ambulatory Visit
Admit: 2022-03-04 | Discharge: 2022-03-04 | Disposition: A | Discharge status: Home / Self Care | Payer: Medicare Other | Attending: Radiation Oncology | Admitting: Radiation Oncology

## 2022-03-04 DIAGNOSIS — R071 Chest pain on breathing: Secondary | ICD-10-CM | POA: Diagnosis not present

## 2022-03-04 DIAGNOSIS — C3491 Malignant neoplasm of unspecified part of right bronchus or lung: Secondary | ICD-10-CM | POA: Diagnosis not present

## 2022-03-04 LAB — GLUCOSE, CAPILLARY
Glucose-Capillary: 247 mg/dL — ABNORMAL HIGH (ref 70–99)
Glucose-Capillary: 232 mg/dL — ABNORMAL HIGH (ref 70–99)
Glucose-Capillary: 112 mg/dL — ABNORMAL HIGH (ref 70–99)
Glucose-Capillary: 207 mg/dL — ABNORMAL HIGH (ref 70–99)

## 2022-03-04 LAB — RAD ONC ARIA SESSION SUMMARY

## 2022-03-04 MED ORDER — LOPERAMIDE HCL 2 MG PO CAPS
2.0000 mg | ORAL_CAPSULE | Freq: Four times a day (QID) | ORAL | Status: DC | PRN
  Administered 2022-03-04 (×2): 2 mg via ORAL
  Filled 2022-03-04 (×2): qty 1

## 2022-03-04 MED ORDER — SACCHAROMYCES BOULARDII 250 MG PO CAPS
250.0000 mg | ORAL_CAPSULE | Freq: Two times a day (BID) | ORAL | Status: AC
  Administered 2022-03-04 – 2022-03-05 (×4): 250 mg via ORAL
  Filled 2022-03-04 (×4): qty 1

## 2022-03-04 NOTE — TOC Initial Note (Signed)
Transition of Care [facility]) - Initial/Assessment Note    Patient Details  Name: Dawn [name] MRN: [id number] Date of Birth: 10/21/1940  [facility] CM/SW Contact:    Bartholome Bill, RN Phone Number: 03/04/2022, 3:03 PM  Clinical Narrative:                 Physical therapy recommendations for SNF. Per PMT they will be doing a GOC with pt and son at 1pm on Friday. Pt faxed out to area SNFs in case plan is for SNF after GOC tomorrow. TOC will follow along. Will need insurance for SNF if that is decision.  Expected Discharge Plan: Skilled Nursing Facility Barriers to Discharge: Continued Medical Work up   Patient Goals and CMS Choice Patient states their goals for this hospitalization and ongoing recovery are:: Home      Expected Discharge Plan and Services Expected Discharge Plan: Skilled Nursing Facility   Discharge Planning Services: CM Consult   Living arrangements for the past 2 months: Apartment                   Prior Living Arrangements/Services Living arrangements for the past 2 months: Apartment Lives with:: Self Patient language and need for interpreter reviewed:: Yes        Need for Family Participation in Patient Care: Yes (Comment) Care giver support system in place?: Yes (comment)   Criminal Activity/Legal Involvement Pertinent to Current Situation/Hospitalization: No - Comment as needed  Activities of Daily Living Home Assistive Devices/Equipment: Other (Comment) (UTA pt is alert sometimes and confused) ADL Screening (condition at time of admission) Patient's cognitive ability adequate to safely complete daily activities?: No Is the patient deaf or have difficulty hearing?: Yes Does the patient have difficulty seeing, even when wearing glasses/contacts?: Yes Does the patient have difficulty concentrating, remembering, or making decisions?: Yes Patient able to express need for assistance with ADLs?: Yes Does the patient have difficulty dressing  or bathing?: Yes Independently performs ADLs?: Yes (appropriate for developmental age) Does the patient have difficulty walking or climbing stairs?: Yes Weakness of Legs: Both Weakness of Arms/Hands: Both   Emotional Assessment Appearance:: Appears stated age     Orientation: : Oriented to Self, Oriented to Place Alcohol / Substance Use: Not Applicable Psych Involvement: No (comment)  Admission diagnosis:  Chest pain [R07.9] Squamous cell carcinoma of right lung (HCC) [C34.91] Chest pain, unspecified type [R07.9] Closed fracture of seventh thoracic vertebra, unspecified fracture morphology, initial encounter (HCC) [S22.069A] Patient Active Problem List   Diagnosis Date Noted   Pathologic fracture of thoracic vertebrae 03/03/2022   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) 03/03/2022   Chest pain 03/02/2022   Closed fracture of seventh thoracic vertebra (HCC)    Encounter for antineoplastic immunotherapy 07/20/2021   Chemotherapy induced neutropenia (HCC) 04/06/2021   Encounter for antineoplastic chemotherapy 03/09/2021   Goals of care, counseling/discussion 02/19/2021   Squamous cell carcinoma of right lung (HCC) 02/19/2021   Acute respiratory failure with hypoxia (HCC) 02/13/2021   Primary lung cancer, right (HCC) 02/02/2021   Mass of middle lobe of right lung 01/29/2021   Adult failure to thrive 01/14/2021   Acute kidney injury (HCC) 01/10/2021   Hypoxic 01/01/2021   Shortness of breath 01/01/2021   Mild mitral regurgitation 01/01/2021   Pneumonia    Hypertension    History of home oxygen therapy    High cholesterol    Full dentures    Diabetes mellitus without complication (HCC)  Depression    Broken wrist    Anxiety    Generalized weakness 12/12/2020   Cigar smoker 12/12/2020   Aortic atherosclerosis (HCC) 12/12/2020   Hypertension associated with diabetes (HCC) 12/11/2020   Hyperlipidemia associated with type 2 diabetes mellitus (HCC) 12/11/2020    Hypokalemia 12/11/2020   Symptomatic anemia 12/04/2020   Type 2 diabetes mellitus (HCC) 12/04/2020   COPD (chronic obstructive pulmonary disease) (HCC) 12/04/2020   Mass of right lung 12/04/2020   Tobacco abuse 09/30/2015   Breast cancer of upper-outer quadrant of left female breast (HCC) 08/18/2015   Essential hypertension, benign 05/22/2014   Mixed hyperlipidemia 05/22/2014   PCP:  Daisy Floro, MD Pharmacy:   [facility] [phone] - [place], [place] - [address] AT [facility] [address] [place] [address] Phone: [phone] Fax: [phone]     Social Determinants of Health (SDOH) Interventions    Readmission Risk Interventions    03/04/2022    3:02 PM  Readmission Risk Prevention Plan  Transportation Screening Complete  PCP or Specialist Appt within 3-5 Days Complete  HRI or Home Care Consult Complete  Social Work Consult for Recovery Care Planning/Counseling Complete  Palliative Care Screening Complete  Medication Review Oceanographer) Complete

## 2022-03-04 NOTE — Progress Notes (Signed)
PROGRESS NOTE    Dawn [name]  TDD:[id number] DOB: 04/30/1941 DOA: 03/02/2022 PCP: Lawerance Cruel, MD    Brief Narrative:  81 year old with SCC stage IIIb on the right side, IDDM, hypertension, anxiety, depression and dementia, COPD presented to the emergency room with right-sided chest pain more pleuritic in nature.  In the emergency room afebrile, on room air.  CT angiogram negative for pulmonary embolism, stable right middle lobe lung mass, moderate right-sided pleural effusion, T7 vertebral body compression fracture and suspicious signs of metastatic lesions.  Oncology was consulted, they recommended steroids and radiation treatment.   Assessment & Plan:   Metastatic cancer pain, suspect metastatic lesions to pleural space, rib and sternum from squamous cell carcinoma: Previously treated with immunotherapy for lung cancer, currently off treatment.  Oncology determined patient is a poor candidate for chemotherapy due to poor functional status.  Palliative care consulted. Radiation oncology consulted for palliative radiation.  Adequate pain medications.  Continue IV steroids. Currently receiving inpatient radiation therapies.  COPD: Without exacerbation.  Steroids, DuoNebs, Breo.  IDDM with hyperglycemia, uncontrolled with use of steroids.  Increase dose of insulin to cover for steroid-induced hyperglycemia.  She is not appropriate candidate for discharge with the insulin. Carb restricted diet.  Essential hypertension: On carvedilol.  Goal of care: Currently receiving radiation treatment to relieve pain and improve functional status.  Continue to work with PT OT. Oncology recommended not a good candidate for chemotherapy. Patient wants to discuss her options with oncology, they will be coming to talk to her. Family meeting is scheduled with palliative care tomorrow 1 PM.  Work with PT OT, explore whether she can go to a SNF and continue outpatient radiation therapies.   DVT  prophylaxis: enoxaparin (LOVENOX) injection 40 mg Start: 03/02/22 1630   Code Status: Full code, palliative consulted Family Communication: Son on the phone 6/21. Disposition Plan: Status is: Inpatient Remains inpatient appropriate because: Inpatient procedures planned     Consultants:  Oncology Radiation oncology Palliative care team  Procedures:  None  Antimicrobials:  None   Subjective:  Patient seen and examined.  Overnight she had multiple episodes of diarrhea and wants to use some Imodium.  She had some dizziness but blood pressures are stable. She is more worried about a lot of sugar on her diet and blood sugars going up.  Objective: Vitals:   03/03/22 1939 03/03/22 2000 03/04/22 0452 03/04/22 0900  BP:  (!) 146/69 (!) 157/89   Pulse:  100 (!) 108 99  Resp:  20 16 16   Temp:  97.6 F (36.4 C) (!) 97.5 F (36.4 C)   TempSrc:  Oral Oral   SpO2: 92% 91% 92% 92%  Weight:      Height:       No intake or output data in the 24 hours ending 03/04/22 1117  Filed Weights   03/02/22 2029  Weight: 67.9 kg    Examination:  General exam: Appears calm and comfortable  Mostly alert and oriented. Respiratory system: No added sounds. She does have some palpable tenderness along the sternum and right side of the chest wall. Cardiovascular system: S1 & S2 heard, RRR. No JVD, murmurs, rubs, gallops or clicks. No pedal edema. She does have chronic ischemic changes on her feet.  Peripheral pulses are adequate. Gastrointestinal system: Abdomen is nondistended, soft and nontender. No organomegaly or masses felt. Normal bowel sounds heard.    Data Reviewed: I have personally reviewed following labs and imaging studies  CBC: Recent Labs  Lab 03/02/22 1107 03/02/22 1111  WBC 4.7  --   NEUTROABS 3.9  --   HGB 12.8 13.6  HCT 41.0 40.0  MCV 96.7  --   PLT 218  --    Basic Metabolic Panel: Recent Labs  Lab 03/02/22 1107 03/02/22 1111  NA 137 137  K 4.3 4.3  CL  102  --   CO2 20*  --   GLUCOSE 334*  --   BUN 26*  --   CREATININE 1.11*  --   CALCIUM 9.6  --    GFR: Estimated Creatinine Clearance: 38 mL/min (A) (by C-G formula based on SCr of 1.11 mg/dL (H)). Liver Function Tests: Recent Labs  Lab 03/02/22 1107  AST 21  ALT 11  ALKPHOS 110  BILITOT 0.8  PROT 6.8  ALBUMIN 3.6   No results for input(s): "LIPASE", "AMYLASE" in the last 168 hours. No results for input(s): "AMMONIA" in the last 168 hours. Coagulation Profile: No results for input(s): "INR", "PROTIME" in the last 168 hours. Cardiac Enzymes: No results for input(s): "CKTOTAL", "CKMB", "CKMBINDEX", "TROPONINI" in the last 168 hours. BNP (last 3 results) No results for input(s): "PROBNP" in the last 8760 hours. HbA1C: Recent Labs    03/02/22 2307  HGBA1C 9.6*   CBG: Recent Labs  Lab 03/03/22 0739 03/03/22 1156 03/03/22 1759 03/03/22 2126 03/04/22 0724  GLUCAP 276* 263* 161* 243* 247*   Lipid Profile: No results for input(s): "CHOL", "HDL", "LDLCALC", "TRIG", "CHOLHDL", "LDLDIRECT" in the last 72 hours. Thyroid Function Tests: No results for input(s): "TSH", "T4TOTAL", "FREET4", "T3FREE", "THYROIDAB" in the last 72 hours. Anemia Panel: No results for input(s): "VITAMINB12", "FOLATE", "FERRITIN", "TIBC", "IRON", "RETICCTPCT" in the last 72 hours. Sepsis Labs: No results for input(s): "PROCALCITON", "LATICACIDVEN" in the last 168 hours.  Recent Results (from the past 240 hour(s))  Resp Panel by RT-PCR (Flu A&B, Covid) Anterior Nasal Swab     Status: None   Collection Time: 03/02/22 10:53 AM   Specimen: Anterior Nasal Swab  Result Value Ref Range Status   SARS Coronavirus 2 by RT PCR NEGATIVE NEGATIVE Final    Comment: (NOTE) SARS-CoV-2 target nucleic acids are NOT DETECTED.  The SARS-CoV-2 RNA is generally detectable in upper respiratory specimens during the acute phase of infection. The lowest concentration of SARS-CoV-2 viral copies this assay can detect  is 138 copies/mL. A negative result does not preclude SARS-Cov-2 infection and should not be used as the sole basis for treatment or other patient management decisions. A negative result may occur with  improper specimen collection/handling, submission of specimen other than nasopharyngeal swab, presence of viral mutation(s) within the areas targeted by this assay, and inadequate number of viral copies(<138 copies/mL). A negative result must be combined with clinical observations, patient history, and epidemiological information. The expected result is Negative.  Fact Sheet for Patients:  EntrepreneurPulse.com.au  Fact Sheet for Healthcare Providers:  IncredibleEmployment.be  This test is no t yet approved or cleared by the [place] FDA and  has been authorized for detection and/or diagnosis of SARS-CoV-2 by FDA under an Emergency Use Authorization (EUA). This EUA will remain  in effect (meaning this test can be used) for the duration of the COVID-19 declaration under Section 564(b)(1) of the Act, 21 U.S.C.section 360bbb-3(b)(1), unless the authorization is terminated  or revoked sooner.       Influenza A by PCR NEGATIVE NEGATIVE Final   Influenza B by PCR NEGATIVE NEGATIVE Final    Comment: (NOTE) The Xpert  Xpress SARS-CoV-2/FLU/RSV plus assay is intended as an aid in the diagnosis of influenza from Nasopharyngeal swab specimens and should not be used as a sole basis for treatment. Nasal washings and aspirates are unacceptable for Xpert Xpress SARS-CoV-2/FLU/RSV testing.  Fact Sheet for Patients: EntrepreneurPulse.com.au  Fact Sheet for Healthcare Providers: IncredibleEmployment.be  This test is not yet approved or cleared by the [place] FDA and has been authorized for detection and/or diagnosis of SARS-CoV-2 by FDA under an Emergency Use Authorization (EUA). This EUA will remain in effect  (meaning this test can be used) for the duration of the COVID-19 declaration under Section 564(b)(1) of the Act, 21 U.S.C. section 360bbb-3(b)(1), unless the authorization is terminated or revoked.  Performed at [facility], [place] [address] [place], [place] [address]          Radiology Studies: CT Angio Chest PE W and/or Wo Contrast  Result Date: 03/02/2022 CLINICAL DATA:  Pulmonary embolism (PE) suspected, high prob. History of non-small cell lung cancer EXAM: CT ANGIOGRAPHY CHEST WITH CONTRAST TECHNIQUE: Multidetector CT imaging of the chest was performed using the standard protocol during bolus administration of intravenous contrast. Multiplanar CT image reconstructions and MIPs were obtained to evaluate the vascular anatomy. RADIATION DOSE REDUCTION: This exam was performed according to the departmental dose-optimization program which includes automated exposure control, adjustment of the mA and/or kV according to patient size and/or use of iterative reconstruction technique. CONTRAST:  143mL OMNIPAQUE IOHEXOL 350 MG/ML SOLN COMPARISON:  Chest XR, 03/02/2022.  CT chest, 09/09/2021. FINDINGS: Cardiovascular: Satisfactory opacification of the pulmonary arteries to the segmental level. No evidence of segmental or larger pulmonary embolus. Normal heart size. No pericardial effusion. Mediastinum/Nodes: No enlarged mediastinal, hilar, or axillary lymph nodes. Thyroid gland, trachea, and esophagus demonstrate no significant findings. Lungs/Pleura: Increased volume of small volume RIGHT basilar pneumothorax. Small volume RIGHT pleural effusion, unchanged. Accounting for volume of inhalation, similar pulmonary findings with middle lobe mass measuring 3.2 x 2.5 cm (previously 3.3 x 2.5 cm), RIGHT perihilar bandlike opacity, calcified granulomas, and small RIGHT basilar nodular opacities. Background of mild underlying centrilobular emphysematous lung change with LEFT basilar pulmonary cyst and LEFT  upper lobe anterior for parenchymal thickening. Upper Abdomen: No acute abnormality. Cholecystectomy. Splenic granulomas. Bilateral renal hypodense lesions, incompletely assessed on this evaluation though previously described as cysts. Musculoskeletal: LEFT breast lumpectomy changes. Progression of vertebral compression at T7 vertebral body, now with approximately 50% loss of height. T7 vertebral body sclerosis suspicious for osseous metastatic disease. Similar appearance of T12 chronic superior endplate compression deformity. Review of the MIP images confirms the above findings. IMPRESSION: 1. No segmental or larger pulmonary embolus. 2. Similar pulmonary findings, accounting for volume of inhalation, with unchanged middle lobe mass, RIGHT perihilar opacity and basilar nodularities and small volume RIGHT pleural effusion within a background of mild centrilobular emphysema 3. Progression of T7 vertebral body compression, now with 50% loss of height. New T7 vertebral body sclerosis is suspicious for osseous metastatic disease given patient history. Electronically Signed   By: Michaelle Birks M.D.   On: 03/02/2022 14:41        Scheduled Meds:  albuterol  2.5 mg Nebulization BID   arformoterol  15 mcg Nebulization BID   And   umeclidinium bromide  1 puff Inhalation Daily   aspirin EC  81 mg Oral Daily   carvedilol  3.125 mg Oral BID WC   dexamethasone (DECADRON) injection  4 mg Intravenous Q12H   enoxaparin (LOVENOX) injection  40  mg Subcutaneous Q24H   ferrous sulfate  325 mg Oral BID   insulin aspart  0-20 Units Subcutaneous TID WC   insulin aspart  0-5 Units Subcutaneous QHS   insulin aspart  4 Units Subcutaneous TID WC   insulin glargine-yfgn  15 Units Subcutaneous Daily   lidocaine  1 patch Transdermal Q24H   memantine  5 mg Oral Daily   multivitamin with minerals  1 tablet Oral Daily   pantoprazole  40 mg Oral QAC breakfast   Ensure Max Protein  11 oz Oral BID   rosuvastatin  40 mg Oral  Daily   saccharomyces boulardii  250 mg Oral BID   Continuous Infusions:   LOS: 2 days    Time spent: 35 minutes    Barb Merino, MD [facility] Pager [phone]

## 2022-03-04 NOTE — NC FL2 (Signed)
[place] LEVEL OF CARE SCREENING TOOL     IDENTIFICATION  Patient Name: Dawn [name] Birthdate: 1941-05-18 Sex: female Admission Date (Current Location): 03/02/2022  [facility] and [place] Number:  Herbalist and Address:  [facility],  [name] [address], [place]      Provider Number: [id number]  Attending Physician Name and Address:  Barb Merino, MD  Relative Name and Phone Number:       Current Level of Care: Hospital Recommended Level of Care: [place] Prior Approval Number:    Date Approved/Denied:   PASRR Number: [id number]  Discharge Plan: SNF    Current Diagnoses: Patient Active Problem List   Diagnosis Date Noted   Pathologic fracture of thoracic vertebrae 03/03/2022   Non-small cell lung cancer metastatic to bone ([place]) 03/03/2022   Chest pain 03/02/2022   Closed fracture of seventh thoracic vertebra ([place])    Encounter for antineoplastic immunotherapy 07/20/2021   Chemotherapy induced neutropenia ([place]) 04/06/2021   Encounter for antineoplastic chemotherapy 03/09/2021   Goals of care, counseling/discussion 02/19/2021   Squamous cell carcinoma of right lung ([place]) 02/19/2021   Acute respiratory failure with hypoxia ([place]) 02/13/2021   Primary lung cancer, right ([place]) 02/02/2021   Mass of middle lobe of right lung 01/29/2021   Adult failure to thrive 01/14/2021   Acute kidney injury ([place]) 01/10/2021   Hypoxic 01/01/2021   Shortness of breath 01/01/2021   Mild mitral regurgitation 01/01/2021   Pneumonia    Hypertension    History of home oxygen therapy    High cholesterol    Full dentures    Diabetes mellitus without complication ([place])    Depression    Broken wrist    Anxiety    Generalized weakness 12/12/2020   Cigar smoker 12/12/2020   Aortic atherosclerosis ([place]) 12/12/2020   Hypertension associated with diabetes ([place]) 12/11/2020   Hyperlipidemia associated with type 2 diabetes mellitus  ([place]) 12/11/2020   Hypokalemia 12/11/2020   Symptomatic anemia 12/04/2020   Type 2 diabetes mellitus ([place]) 12/04/2020   COPD (chronic obstructive pulmonary disease) ([place]) 12/04/2020   Mass of right lung 12/04/2020   Tobacco abuse 09/30/2015   Breast cancer of upper-outer quadrant of left female breast ([place]) 08/18/2015   Essential hypertension, benign 05/22/2014   Mixed hyperlipidemia 05/22/2014    Orientation RESPIRATION BLADDER Height & Weight     Self, Place  Normal Continent Weight: 67.9 kg Height:  5\' 3"  (160 cm)  BEHAVIORAL SYMPTOMS/MOOD NEUROLOGICAL BOWEL NUTRITION STATUS      Continent Diet (carb modified)  AMBULATORY STATUS COMMUNICATION OF NEEDS Skin   Extensive Assist Verbally Normal                       Personal Care Assistance Level of Assistance  Bathing, Feeding, Dressing Bathing Assistance: Maximum assistance Feeding assistance: Limited assistance Dressing Assistance: Maximum assistance     Functional Limitations Info  Sight, Hearing, Speech Sight Info: Impaired Hearing Info: Impaired Speech Info: Adequate    SPECIAL CARE FACTORS FREQUENCY  PT (By licensed PT), OT (By licensed OT)     PT Frequency: 5 x weekly OT Frequency: 5 x weekly            Contractures Contractures Info: Not present    Additional Factors Info  Allergies, Code Status Code Status Info: Full Allergies Info: Ferumoxytol, Pneumococcal Vaccines, Repaglinide           Current Medications (03/04/2022):  This  is the current hospital active medication list Current Facility-Administered Medications  Medication Dose Route Frequency Provider Last Rate Last Admin   acetaminophen (TYLENOL) tablet 650 mg  650 mg Oral Q6H PRN Kathryne Eriksson, NP       albuterol (PROVENTIL) (2.5 MG/3ML) 0.083% nebulizer solution 2.5 mg  2.5 mg Nebulization BID Barb Merino, MD   2.5 mg at 03/04/22 0911   albuterol (PROVENTIL) (2.5 MG/3ML) 0.083% nebulizer solution 3 mL  3 mL Inhalation Q2H PRN  Wynetta Fines T, MD       arformoterol [facility]) nebulizer solution 15 mcg  15 mcg Nebulization BID Wynetta Fines T, MD   15 mcg at 03/04/22 0912   And   umeclidinium bromide (INCRUSE ELLIPTA) 62.5 MCG/ACT 1 puff  1 puff Inhalation Daily Wynetta Fines T, MD   1 puff at 03/04/22 1004   aspirin EC tablet 81 mg  81 mg Oral Daily Wynetta Fines T, MD   81 mg at 03/04/22 0824   bisacodyl (DULCOLAX) EC tablet 5 mg  5 mg Oral Daily PRN Wynetta Fines T, MD       carvedilol (COREG) tablet 3.125 mg  3.125 mg Oral BID WC Wynetta Fines T, MD   3.125 mg at 03/04/22 0824   dexamethasone (DECADRON) injection 4 mg  4 mg Intravenous Q12H Regan Lemming, MD   4 mg at 03/04/22 0824   enoxaparin (LOVENOX) injection 40 mg  40 mg Subcutaneous Q24H Wynetta Fines T, MD   40 mg at 03/03/22 1744   ferrous sulfate tablet 325 mg  325 mg Oral BID Wynetta Fines T, MD   325 mg at 03/04/22 1324   HYDROmorphone (DILAUDID) injection 0.5 mg  0.5 mg Intravenous Q4H PRN Wynetta Fines T, MD   0.5 mg at 03/04/22 0831   insulin aspart (novoLOG) injection 0-20 Units  0-20 Units Subcutaneous TID WC Wynetta Fines T, MD   7 Units at 03/04/22 1152   insulin aspart (novoLOG) injection 0-5 Units  0-5 Units Subcutaneous QHS Wynetta Fines T, MD   2 Units at 03/03/22 2154   insulin aspart (novoLOG) injection 4 Units  4 Units Subcutaneous TID WC Barb Merino, MD   4 Units at 03/04/22 1152   insulin glargine-yfgn (SEMGLEE) injection 15 Units  15 Units Subcutaneous Daily Barb Merino, MD   15 Units at 03/04/22 0825   lidocaine (LIDODERM) 5 % 1 patch  1 patch Transdermal Q24H Regan Lemming, MD   1 patch at 03/03/22 1529   loperamide (IMODIUM) capsule 2 mg  2 mg Oral Q6H PRN Barb Merino, MD   2 mg at 03/04/22 1004   memantine (NAMENDA) tablet 5 mg  5 mg Oral Daily Wynetta Fines T, MD   5 mg at 03/04/22 4010   multivitamin with minerals tablet 1 tablet  1 tablet Oral Daily Barb Merino, MD   1 tablet at 03/04/22 2725   oxyCODONE (Oxy IR/ROXICODONE) immediate release  tablet 5 mg  5 mg Oral Q6H PRN Wynetta Fines T, MD   5 mg at 03/03/22 1745   pantoprazole (PROTONIX) EC tablet 40 mg  40 mg Oral QAC breakfast Wynetta Fines T, MD   40 mg at 03/04/22 0824   prochlorperazine (COMPAZINE) tablet 10 mg  10 mg Oral Q6H PRN Wynetta Fines T, MD   10 mg at 03/03/22 0237   protein supplement (ENSURE MAX) liquid  11 oz Oral BID Barb Merino, MD   11 oz at 03/03/22 2157   rosuvastatin (CRESTOR) tablet 40  mg  40 mg Oral Daily Wynetta Fines T, MD   40 mg at 03/04/22 4199   saccharomyces boulardii (FLORASTOR) capsule 250 mg  250 mg Oral BID Kathryne Eriksson, NP   250 mg at 03/04/22 0825     Discharge Medications: Please see discharge summary for a list of discharge medications.  Relevant Imaging Results:  Relevant Lab Results:   Additional Information SS# [id number]  Adell Panek, Marjie Skiff, RN

## 2022-03-05 ENCOUNTER — Ambulatory Visit
Admit: 2022-03-05 | Discharge: 2022-03-05 | Disposition: A | Discharge status: Home / Self Care | Payer: Medicare Other | Attending: Radiation Oncology | Admitting: Radiation Oncology

## 2022-03-05 ENCOUNTER — Other Ambulatory Visit: Payer: Self-pay

## 2022-03-05 DIAGNOSIS — R071 Chest pain on breathing: Secondary | ICD-10-CM | POA: Diagnosis not present

## 2022-03-05 DIAGNOSIS — Z7189 Other specified counseling: Secondary | ICD-10-CM | POA: Diagnosis not present

## 2022-03-05 DIAGNOSIS — C3491 Malignant neoplasm of unspecified part of right bronchus or lung: Secondary | ICD-10-CM | POA: Diagnosis not present

## 2022-03-05 DIAGNOSIS — Z66 Do not resuscitate: Secondary | ICD-10-CM

## 2022-03-05 DIAGNOSIS — Z515 Encounter for palliative care: Secondary | ICD-10-CM | POA: Diagnosis not present

## 2022-03-05 DIAGNOSIS — C3490 Malignant neoplasm of unspecified part of unspecified bronchus or lung: Secondary | ICD-10-CM | POA: Diagnosis not present

## 2022-03-05 DIAGNOSIS — C7951 Secondary malignant neoplasm of bone: Secondary | ICD-10-CM

## 2022-03-05 LAB — RAD ONC ARIA SESSION SUMMARY

## 2022-03-05 LAB — GLUCOSE, CAPILLARY
Glucose-Capillary: 168 mg/dL — ABNORMAL HIGH (ref 70–99)
Glucose-Capillary: 64 mg/dL — ABNORMAL LOW (ref 70–99)
Glucose-Capillary: 83 mg/dL (ref 70–99)
Glucose-Capillary: 276 mg/dL — ABNORMAL HIGH (ref 70–99)
Glucose-Capillary: 210 mg/dL — ABNORMAL HIGH (ref 70–99)

## 2022-03-05 MED ORDER — LIDOCAINE VISCOUS HCL 2 % MT SOLN
15.0000 mL | Freq: Once | OROMUCOSAL | Status: AC
  Administered 2022-03-05: 15 mL via ORAL
  Filled 2022-03-05: qty 15

## 2022-03-05 MED ORDER — ALUM & MAG HYDROXIDE-SIMETH 200-200-20 MG/5ML PO SUSP
30.0000 mL | Freq: Once | ORAL | Status: AC
  Administered 2022-03-05: 30 mL via ORAL
  Filled 2022-03-05: qty 30

## 2022-03-05 MED ORDER — INSULIN GLARGINE-YFGN 100 UNIT/ML ~~LOC~~ SOLN: 18.0000 [IU] | Freq: Every day | SUBCUTANEOUS | Status: DC

## 2022-03-05 MED ORDER — INSULIN ASPART 100 UNIT/ML IJ SOLN
0.0000 [IU] | Freq: Three times a day (TID) | INTRAMUSCULAR | Status: DC
  Administered 2022-03-05: 5 [IU] via SUBCUTANEOUS
  Administered 2022-03-06: 3 [IU] via SUBCUTANEOUS
  Administered 2022-03-06: 7 [IU] via SUBCUTANEOUS
  Administered 2022-03-06: 3 [IU] via SUBCUTANEOUS
  Administered 2022-03-07: 5 [IU] via SUBCUTANEOUS
  Administered 2022-03-07: 3 [IU] via SUBCUTANEOUS
  Administered 2022-03-07: 5 [IU] via SUBCUTANEOUS
  Administered 2022-03-08: 3 [IU] via SUBCUTANEOUS
  Administered 2022-03-08: 5 [IU] via SUBCUTANEOUS
  Administered 2022-03-08: 2 [IU] via SUBCUTANEOUS
  Administered 2022-03-09: 3 [IU] via SUBCUTANEOUS
  Administered 2022-03-09: 2 [IU] via SUBCUTANEOUS
  Administered 2022-03-09: 3 [IU] via SUBCUTANEOUS

## 2022-03-05 MED ORDER — INSULIN GLARGINE-YFGN 100 UNIT/ML ~~LOC~~ SOLN
12.0000 [IU] | Freq: Every day | SUBCUTANEOUS | Status: DC
  Administered 2022-03-06 – 2022-03-09 (×4): 12 [IU] via SUBCUTANEOUS
  Filled 2022-03-05 (×4): qty 0.12

## 2022-03-05 MED ORDER — PANTOPRAZOLE SODIUM 40 MG PO TBEC
40.0000 mg | DELAYED_RELEASE_TABLET | Freq: Two times a day (BID) | ORAL | Status: DC
  Administered 2022-03-05 – 2022-03-09 (×8): 40 mg via ORAL
  Filled 2022-03-05 (×8): qty 1

## 2022-03-05 MED ORDER — LIDOCAINE 5 % EX PTCH
1.0000 | MEDICATED_PATCH | CUTANEOUS | Status: DC
  Administered 2022-03-06 – 2022-03-09 (×4): 1 via TRANSDERMAL
  Filled 2022-03-05 (×4): qty 1

## 2022-03-05 MED ORDER — LOPERAMIDE HCL 2 MG PO CAPS
4.0000 mg | ORAL_CAPSULE | Freq: Four times a day (QID) | ORAL | Status: DC | PRN
Start: 2022-03-05 — End: 2022-03-10
  Administered 2022-03-05: 4 mg via ORAL
  Filled 2022-03-05: qty 2

## 2022-03-05 MED ORDER — INSULIN ASPART 100 UNIT/ML IJ SOLN
3.0000 [IU] | Freq: Three times a day (TID) | INTRAMUSCULAR | Status: DC
  Administered 2022-03-05 – 2022-03-09 (×12): 3 [IU] via SUBCUTANEOUS

## 2022-03-05 MED ORDER — OXYCODONE HCL 5 MG PO TABS
10.0000 mg | ORAL_TABLET | ORAL | Status: DC | PRN
  Administered 2022-03-05 – 2022-03-07 (×5): 10 mg via ORAL
  Filled 2022-03-05 (×6): qty 2

## 2022-03-05 NOTE — Progress Notes (Signed)
Physical Therapy Treatment Patient Details Name: Dawn [name] MRN: [id number] DOB: 1941/05/11 Today's Date: 03/05/2022   History of Present Illness Dawn [name] is a 81 y.o. female with medical history significant of squamous cell lung carcinoma stage IIIb on the right side, IDDM, HTN, HLD, anxiety/depression, dementia with severe memory loss, COPD, presented with new onset chest pains.  Found to have stable R lung mass and T7 compression fracture as well as other suspicious signs of metastatic lesions.    PT Comments    Pt is a 81 y.o. female with above HPI resulting in the deficits listed below (see PT Problem List). Pt performed sit to stand transfers with MIN A progressing to MIN guard for safety and cues for safe hand placement. Pt ambulated total of ~41ft, 69ft, 31ft  MIN guard with use of RW. Pt easily distracted and bumping into obstacles in hallway.  Pt will benefit from skilled PT to maximize safety with functional mobility and increase independence. Discussed with nursing staff on continued mobilization outside of therapy sessions during stay.     Recommendations for follow up therapy are one component of a multi-disciplinary discharge planning process, led by the attending physician.  Recommendations may be updated based on patient status, additional functional criteria and insurance authorization.  Follow Up Recommendations  Skilled nursing-short term rehab (<3 hours/day) Can patient physically be transported by private vehicle: Yes   Assistance Recommended at Discharge Frequent or constant Supervision/Assistance  Patient can return home with the following A little help with walking and/or transfers;A little help with bathing/dressing/bathroom;Direct supervision/assist for financial management;Direct supervision/assist for medications management;Assistance with cooking/housework;Assist for transportation;Help with stairs or ramp for entrance   Equipment Recommendations  Rolling  walker (2 wheels)    Recommendations for Other Services       Precautions / Restrictions Precautions Precautions: Fall Precaution Comments: watch HR/RR Restrictions Weight Bearing Restrictions: No     Mobility  Bed Mobility Overal bed mobility: Modified Independent                  Transfers Overall transfer level: Needs assistance Equipment used: Rolling walker (2 wheels) Transfers: Sit to/from Stand Sit to Stand: Min assist           General transfer comment: STS x2; increased time to rise, posterior lean initially upon standing. Able to stand with MIN guard on 2nd trial    Ambulation/Gait Ambulation/Gait assistance: Min guard Gait Distance (Feet): 30 Feet Assistive device: Rolling walker (2 wheels) Gait Pattern/deviations: Step-through pattern, Decreased stride length, Trunk flexed Gait velocity: decr     General Gait Details: increased fatigue after 69ft requiring seaterd rest, additional 23ft, and 64ft with standing rest break between. Easily distracted in hallway and running into obstacles with RW, drifitng R/L.Reports increased SOB and LEs feeling weak today. VSS with mobility, O2>90% and HR observed up to 98bpm.   Stairs             Wheelchair Mobility    Modified Rankin (Stroke Patients Only)       Balance Overall balance assessment: Needs assistance   Sitting balance-Leahy Scale: Good     Standing balance support: Bilateral upper extremity supported, During functional activity Standing balance-Leahy Scale: Fair                              Cognition Arousal/Alertness: Awake/alert Behavior During Therapy: WFL for tasks assessed/performed Overall Cognitive Status: No family/caregiver present to determine  baseline cognitive functioning                                 General Comments: Oriented to place, year, x2 attempts for correct month.        Exercises      General Comments         Pertinent Vitals/Pain Pain Assessment Pain Assessment: 0-10 Pain Score: 6  Pain Location: chest between breasts Pain Descriptors / Indicators: Sharp Pain Intervention(s): Limited activity within patient's tolerance, Monitored during session, Repositioned    Home Living                          Prior Function            PT Goals (current goals can now be found in the care plan section) Acute Rehab PT Goals Patient Stated Goal: agreeable to STSNF PT Goal Formulation: With patient Time For Goal Achievement: 03/17/22 Potential to Achieve Goals: Good Progress towards PT goals: Progressing toward goals    Frequency    Min 3X/week      PT Plan Current plan remains appropriate    Co-evaluation              AM-PAC PT "6 Clicks" Mobility   Outcome Measure  Help needed turning from your back to your side while in a flat bed without using bedrails?: None Help needed moving from lying on your back to sitting on the side of a flat bed without using bedrails?: A Little Help needed moving to and from a bed to a chair (including a wheelchair)?: A Little Help needed standing up from a chair using your arms (e.g., wheelchair or bedside chair)?: A Little Help needed to walk in hospital room?: A Little Help needed climbing 3-5 steps with a railing? : Total 6 Click Score: 17    End of Session Equipment Utilized During Treatment: Gait belt Activity Tolerance: Patient limited by fatigue Patient left: in chair;with call bell/phone within reach;with chair alarm set;with nursing/sitter in room Nurse Communication: Mobility status PT Visit Diagnosis: Other abnormalities of gait and mobility (R26.89);Muscle weakness (generalized) (M62.81)     Time: 0454-0981 PT Time Calculation (min) (ACUTE ONLY): 21 min  Charges:  $Therapeutic Activity: 8-22 mins                     Lyman Speller PT, DPT  Acute Rehabilitation Services  Office [phone]  03/05/2022, 11:10 AM

## 2022-03-05 NOTE — Progress Notes (Signed)
Palliative:  HPI: 80 y.o. female  with past medical history of dementia, right-sided squamous cell lung carcinoma stage 3b, diabetes, HTN, HLD, anxiety/depression admitted on 03/02/2022 with chest pain with evidence of mets to T7 on steroids and with plans to pursue radiation therapy.   I met today with Dawn [name] and her son, Dawn [name], at bedside after her radiation treatment. We reviewed goals of care but this was very challenging due to Dawn [name]'s significant memory deficit as she cannot remember the conversation after a minute or so. Ultimately with the assistance of her son and prolonged discussion and explanations we determined course of action moving forward. Although she repeatedly shares that she wants everything done to live as long as possible when discussing in detail she does indicate a strong desire for quality of life and would not want to be prolonged in a vegetative state. She also agrees that she would not want interventions that are thought to cause her pain and suffering if we do not feel she will reap benefits. With this in mind we clarified the following decisions:  - DNR - Continue radiation therapy - No desire to pursue systemic treatment - Pursue SNF rehab (with palliative) - Hopeful to return to her home after rehab stay and agrees to hospice assistance  Dawn [name] and Dawn [name] agree to above plan. Dawn [name] is not expected to be able to recall these decisions so I do not recommend further discussion with her. She was equally devastated by the news about her progressing and terminal cancer multiple times throughout my conversation and I do not see the need to continue to put her through this since a plan has been determined. Son Dawn [name] should be called for any further decisions. I discussed with Dawn [name] the benefits of hospice and social work to assist with placement if needed and hospice facility when she declines to prognosis days to weeks. We discussed prognosis of < 6 months.   All  questions/concerns addressed. Emotional support provided.   Exam: Alert, oriented to person. Significant short-term memory deficits. No distress. Ongoing pain in L chest under breast likely related to cancer mets. Breathing regular, unlabored. Abd soft. Moves all extremities. Able to transfer and ambulate with min assist.   Plan: - DNR - SNF rehab with palliative followed by home with hospice  90 min  Dawn Channel, NP Palliative Medicine Team Pager [phone] (Please see [email or web address] for schedule) Team Phone [phone]    Greater than 50%  of this time was spent counseling and coordinating care related to the above assessment and plan

## 2022-03-06 DIAGNOSIS — C3491 Malignant neoplasm of unspecified part of right bronchus or lung: Secondary | ICD-10-CM | POA: Diagnosis not present

## 2022-03-06 DIAGNOSIS — R071 Chest pain on breathing: Secondary | ICD-10-CM | POA: Diagnosis not present

## 2022-03-06 LAB — GLUCOSE, CAPILLARY
Glucose-Capillary: 247 mg/dL — ABNORMAL HIGH (ref 70–99)
Glucose-Capillary: 329 mg/dL — ABNORMAL HIGH (ref 70–99)
Glucose-Capillary: 227 mg/dL — ABNORMAL HIGH (ref 70–99)
Glucose-Capillary: 206 mg/dL — ABNORMAL HIGH (ref 70–99)

## 2022-03-06 NOTE — Progress Notes (Signed)
PROGRESS NOTE    Dawn [name]  XNA:[id number] DOB: 28-Feb-1941 DOA: 03/02/2022 PCP: Daisy Floro, MD    Brief Narrative:  81 year old with SCC stage IIIb on the right side, NIDDM, hypertension, anxiety, depression and dementia, COPD presented to the emergency room with right-sided chest pain more pleuritic in nature.  In the emergency room afebrile, on room air.  CT angiogram negative for pulmonary embolism, stable right middle lobe lung mass, moderate right-sided pleural effusion, T7 vertebral body compression fracture and suspicious signs of metastatic lesions.  Oncology was consulted, they recommended steroids and radiation treatment. Remains in the hospital because she cannot go home.  Needs skilled nursing rehab.   Assessment & Plan:   Metastatic cancer pain, suspect metastatic lesions to pleural space, rib and sternum from squamous cell carcinoma: Previously treated with immunotherapy for lung cancer, currently off treatment.  Oncology determined patient is a poor candidate for chemotherapy due to poor functional status.   Receiving inpatient radiation therapies for pain.  On IV steroids. Using oxycodone for pain relief. Some clinical improvement today.  COPD: Without exacerbation. DuoNebs, Breo.  NIDDM with hyperglycemia, uncontrolled with use of steroids.  Blood sugars variable.  Had an episode of low blood sugars yesterday.  Decreasing dose of insulin.  Ensure adequate oral intake.  Essential hypertension: On carvedilol.  Stable.  Watery diarrhea: Improving.  Imodium as needed.  Goal of care: Currently receiving radiation treatment to relieve pain and improve functional status.  Continue to work with PT OT. Oncology advised that she is not a good candidate for chemotherapy. Plan is to continue radiation therapy, pursue SNF placement    DVT prophylaxis: enoxaparin (LOVENOX) injection 40 mg Start: 03/02/22 1630   Code Status: DNR/DNI Family Communication: None  today. Disposition Plan: Status is: Inpatient Remains inpatient appropriate because: Inpatient radiation therapy planned.  Need a SNF.     Consultants:  Oncology Radiation oncology Palliative care team  Procedures:  None  Antimicrobials:  None   Subjective:  Seen and examined.  Not hurting today.  Diarrhea improved.  Pleasant and confused.  Objective: Vitals:   03/05/22 0455 03/05/22 2000 03/05/22 2152 03/06/22 0813  BP: 135/81  124/71   Pulse: 86  80   Resp: 18  16   Temp: 97.8 F (36.6 C)  (!) 97.5 F (36.4 C)   TempSrc: Oral  Oral   SpO2: 91% 94% 95% 99%  Weight:      Height:        Intake/Output Summary (Last 24 hours) at 03/06/2022 1119 Last data filed at 03/06/2022 1000 Gross per 24 hour  Intake 240 ml  Output --  Net 240 ml    Filed Weights   03/02/22 2029  Weight: 67.9 kg    Examination:  General: Looks fairly comfortable.  Eating breakfast. Cardiovascular: S1-S2 normal.  Regular rate rhythm. Respiratory: Bilateral clear.  No added sounds. Gastrointestinal: Soft.  Nontender.  Bowel sound present. Ext: No cyanosis or edema.  No swelling. Neuro: Alert and awake, oriented to self.  Forgetful.      Data Reviewed: I have personally reviewed following labs and imaging studies  CBC: Recent Labs  Lab 03/02/22 1107 03/02/22 1111  WBC 4.7  --   NEUTROABS 3.9  --   HGB 12.8 13.6  HCT 41.0 40.0  MCV 96.7  --   PLT 218  --    Basic Metabolic Panel: Recent Labs  Lab 03/02/22 1107 03/02/22 1111  NA 137 137  K 4.3 4.3  CL 102  --   CO2 20*  --   GLUCOSE 334*  --   BUN 26*  --   CREATININE 1.11*  --   CALCIUM 9.6  --    GFR: Estimated Creatinine Clearance: 38 mL/min (A) (by C-G formula based on SCr of 1.11 mg/dL (H)). Liver Function Tests: Recent Labs  Lab 03/02/22 1107  AST 21  ALT 11  ALKPHOS 110  BILITOT 0.8  PROT 6.8  ALBUMIN 3.6   No results for input(s): "LIPASE", "AMYLASE" in the last 168 hours. No results for  input(s): "AMMONIA" in the last 168 hours. Coagulation Profile: No results for input(s): "INR", "PROTIME" in the last 168 hours. Cardiac Enzymes: No results for input(s): "CKTOTAL", "CKMB", "CKMBINDEX", "TROPONINI" in the last 168 hours. BNP (last 3 results) No results for input(s): "PROBNP" in the last 8760 hours. HbA1C: No results for input(s): "HGBA1C" in the last 72 hours.  CBG: Recent Labs  Lab 03/05/22 1153 03/05/22 1209 03/05/22 1748 03/05/22 2154 03/06/22 0746  GLUCAP 64* 83 276* 210* 247*   Lipid Profile: No results for input(s): "CHOL", "HDL", "LDLCALC", "TRIG", "CHOLHDL", "LDLDIRECT" in the last 72 hours. Thyroid Function Tests: No results for input(s): "TSH", "T4TOTAL", "FREET4", "T3FREE", "THYROIDAB" in the last 72 hours. Anemia Panel: No results for input(s): "VITAMINB12", "FOLATE", "FERRITIN", "TIBC", "IRON", "RETICCTPCT" in the last 72 hours. Sepsis Labs: No results for input(s): "PROCALCITON", "LATICACIDVEN" in the last 168 hours.  Recent Results (from the past 240 hour(s))  Resp Panel by RT-PCR (Flu A&B, Covid) Anterior Nasal Swab     Status: None   Collection Time: 03/02/22 10:53 AM   Specimen: Anterior Nasal Swab  Result Value Ref Range Status   SARS Coronavirus 2 by RT PCR NEGATIVE NEGATIVE Final    Comment: (NOTE) SARS-CoV-2 target nucleic acids are NOT DETECTED.  The SARS-CoV-2 RNA is generally detectable in upper respiratory specimens during the acute phase of infection. The lowest concentration of SARS-CoV-2 viral copies this assay can detect is 138 copies/mL. A negative result does not preclude SARS-Cov-2 infection and should not be used as the sole basis for treatment or other patient management decisions. A negative result may occur with  improper specimen collection/handling, submission of specimen other than nasopharyngeal swab, presence of viral mutation(s) within the areas targeted by this assay, and inadequate number of viral copies(<138  copies/mL). A negative result must be combined with clinical observations, patient history, and epidemiological information. The expected result is Negative.  Fact Sheet for Patients:  BloggerCourse.com  Fact Sheet for Healthcare Providers:  SeriousBroker.it  This test is no t yet approved or cleared by the [place] FDA and  has been authorized for detection and/or diagnosis of SARS-CoV-2 by FDA under an Emergency Use Authorization (EUA). This EUA will remain  in effect (meaning this test can be used) for the duration of the COVID-19 declaration under Section 564(b)(1) of the Act, 21 U.S.C.section 360bbb-3(b)(1), unless the authorization is terminated  or revoked sooner.       Influenza A by PCR NEGATIVE NEGATIVE Final   Influenza B by PCR NEGATIVE NEGATIVE Final    Comment: (NOTE) The Xpert Xpress SARS-CoV-2/FLU/RSV plus assay is intended as an aid in the diagnosis of influenza from Nasopharyngeal swab specimens and should not be used as a sole basis for treatment. Nasal washings and aspirates are unacceptable for Xpert Xpress SARS-CoV-2/FLU/RSV testing.  Fact Sheet for Patients: BloggerCourse.com  Fact Sheet for Healthcare Providers: SeriousBroker.it  This test is not yet approved  or cleared by the [place] and has been authorized for detection and/or diagnosis of SARS-CoV-2 by FDA under an Emergency Use Authorization (EUA). This EUA will remain in effect (meaning this test can be used) for the duration of the COVID-19 declaration under Section 564(b)(1) of the Act, 21 U.S.C. section 360bbb-3(b)(1), unless the authorization is terminated or revoked.  Performed at [facility], [address], [place], [place] [address]          Radiology Studies: No results found.      Scheduled Meds:  arformoterol  15 mcg Nebulization BID   And    umeclidinium bromide  1 puff Inhalation Daily   aspirin EC  81 mg Oral Daily   carvedilol  3.125 mg Oral BID WC   dexamethasone (DECADRON) injection  4 mg Intravenous Q12H   enoxaparin (LOVENOX) injection  40 mg Subcutaneous Q24H   ferrous sulfate  325 mg Oral BID   insulin aspart  0-5 Units Subcutaneous QHS   insulin aspart  0-9 Units Subcutaneous TID WC   insulin aspart  3 Units Subcutaneous TID WC   insulin glargine-yfgn  12 Units Subcutaneous Daily   lidocaine  1 patch Transdermal Q24H   memantine  5 mg Oral Daily   multivitamin with minerals  1 tablet Oral Daily   pantoprazole  40 mg Oral BID   Ensure Max Protein  11 oz Oral BID   rosuvastatin  40 mg Oral Daily   Continuous Infusions:   LOS: 4 days    Time spent: 35 minutes    Dorcas Carrow, MD [facility] Pager [phone]

## 2022-03-07 DIAGNOSIS — C3491 Malignant neoplasm of unspecified part of right bronchus or lung: Secondary | ICD-10-CM | POA: Diagnosis not present

## 2022-03-07 DIAGNOSIS — R071 Chest pain on breathing: Secondary | ICD-10-CM | POA: Diagnosis not present

## 2022-03-07 LAB — GLUCOSE, CAPILLARY
Glucose-Capillary: 222 mg/dL — ABNORMAL HIGH (ref 70–99)
Glucose-Capillary: 282 mg/dL — ABNORMAL HIGH (ref 70–99)
Glucose-Capillary: 286 mg/dL — ABNORMAL HIGH (ref 70–99)
Glucose-Capillary: 198 mg/dL — ABNORMAL HIGH (ref 70–99)

## 2022-03-07 MED ORDER — OXYCODONE HCL 5 MG PO TABS
5.0000 mg | ORAL_TABLET | Freq: Four times a day (QID) | ORAL | Status: DC | PRN
  Administered 2022-03-07 – 2022-03-09 (×6): 5 mg via ORAL
  Filled 2022-03-07 (×7): qty 1

## 2022-03-07 MED ORDER — DEXAMETHASONE 4 MG PO TABS
4.0000 mg | ORAL_TABLET | Freq: Every day | ORAL | Status: DC
  Administered 2022-03-08 – 2022-03-09 (×2): 4 mg via ORAL
  Filled 2022-03-07 (×2): qty 1

## 2022-03-08 ENCOUNTER — Ambulatory Visit
Admit: 2022-03-08 | Discharge: 2022-03-08 | Disposition: A | Discharge status: Home / Self Care | Payer: Medicare Other | Attending: Radiation Oncology | Admitting: Radiation Oncology

## 2022-03-08 ENCOUNTER — Other Ambulatory Visit: Payer: Self-pay

## 2022-03-08 DIAGNOSIS — C3491 Malignant neoplasm of unspecified part of right bronchus or lung: Secondary | ICD-10-CM | POA: Diagnosis not present

## 2022-03-08 DIAGNOSIS — R071 Chest pain on breathing: Secondary | ICD-10-CM | POA: Diagnosis not present

## 2022-03-08 LAB — RAD ONC ARIA SESSION SUMMARY
Course Elapsed Days: 4
Plan Fractions Treated to Date: 3
Plan Fractions Treated to Date: 3
Plan Prescribed Dose Per Fraction: 3 Gy
Plan Prescribed Dose Per Fraction: 3 Gy
Plan Total Fractions Prescribed: 10
Plan Total Fractions Prescribed: 10
Plan Total Prescribed Dose: 30 Gy
Plan Total Prescribed Dose: 30 Gy
Reference Point Dosage Given to Date: 9 Gy
Reference Point Dosage Given to Date: 9 Gy
Reference Point Session Dosage Given: 3 Gy
Reference Point Session Dosage Given: 3 Gy
Session Number: 3

## 2022-03-08 LAB — GLUCOSE, CAPILLARY
Glucose-Capillary: 174 mg/dL — ABNORMAL HIGH (ref 70–99)
Glucose-Capillary: 214 mg/dL — ABNORMAL HIGH (ref 70–99)
Glucose-Capillary: 271 mg/dL — ABNORMAL HIGH (ref 70–99)
Glucose-Capillary: 237 mg/dL — ABNORMAL HIGH (ref 70–99)

## 2022-03-08 MED ORDER — PANTOPRAZOLE SODIUM 40 MG PO TBEC: 40.0000 mg | DELAYED_RELEASE_TABLET | Freq: Two times a day (BID) | ORAL | 0 refills | Status: AC

## 2022-03-08 MED ORDER — OXYCODONE HCL 5 MG PO TABS
5.0000 mg | ORAL_TABLET | Freq: Four times a day (QID) | ORAL | 0 refills | Status: AC | PRN
Start: 2022-03-08 — End: 2022-03-13

## 2022-03-08 NOTE — Care Management Important Message (Signed)
Important Message  Patient Details IM Letter placed in Patients room. Name: Dawn [name] MRN: [id number] Date of Birth: 06/23/1941   Medicare Important Message Given:  Yes     Caren Macadam 03/08/2022, 10:13 AM

## 2022-03-09 ENCOUNTER — Ambulatory Visit
Admit: 2022-03-09 | Discharge: 2022-03-09 | Disposition: A | Discharge status: Home / Self Care | Payer: Medicare Other | Attending: Radiation Oncology | Admitting: Radiation Oncology

## 2022-03-09 ENCOUNTER — Other Ambulatory Visit: Payer: Self-pay

## 2022-03-09 DIAGNOSIS — R109 Unspecified abdominal pain: Secondary | ICD-10-CM | POA: Diagnosis not present

## 2022-03-09 DIAGNOSIS — R2681 Unsteadiness on feet: Secondary | ICD-10-CM | POA: Diagnosis not present

## 2022-03-09 DIAGNOSIS — K219 Gastro-esophageal reflux disease without esophagitis: Secondary | ICD-10-CM | POA: Diagnosis not present

## 2022-03-09 DIAGNOSIS — R5381 Other malaise: Secondary | ICD-10-CM | POA: Diagnosis not present

## 2022-03-09 DIAGNOSIS — M6281 Muscle weakness (generalized): Secondary | ICD-10-CM | POA: Diagnosis not present

## 2022-03-09 DIAGNOSIS — M8448XA Pathological fracture, other site, initial encounter for fracture: Secondary | ICD-10-CM | POA: Diagnosis not present

## 2022-03-09 DIAGNOSIS — C3490 Malignant neoplasm of unspecified part of unspecified bronchus or lung: Secondary | ICD-10-CM | POA: Diagnosis not present

## 2022-03-09 DIAGNOSIS — R071 Chest pain on breathing: Secondary | ICD-10-CM | POA: Diagnosis not present

## 2022-03-09 DIAGNOSIS — J449 Chronic obstructive pulmonary disease, unspecified: Secondary | ICD-10-CM | POA: Diagnosis not present

## 2022-03-09 DIAGNOSIS — C342 Malignant neoplasm of middle lobe, bronchus or lung: Secondary | ICD-10-CM | POA: Diagnosis not present

## 2022-03-09 DIAGNOSIS — R1312 Dysphagia, oropharyngeal phase: Secondary | ICD-10-CM | POA: Diagnosis not present

## 2022-03-09 DIAGNOSIS — R112 Nausea with vomiting, unspecified: Secondary | ICD-10-CM | POA: Diagnosis not present

## 2022-03-09 DIAGNOSIS — Z789 Other specified health status: Secondary | ICD-10-CM | POA: Diagnosis not present

## 2022-03-09 DIAGNOSIS — E118 Type 2 diabetes mellitus with unspecified complications: Secondary | ICD-10-CM | POA: Diagnosis not present

## 2022-03-09 DIAGNOSIS — R079 Chest pain, unspecified: Secondary | ICD-10-CM | POA: Diagnosis not present

## 2022-03-09 DIAGNOSIS — F32A Depression, unspecified: Secondary | ICD-10-CM | POA: Diagnosis not present

## 2022-03-09 DIAGNOSIS — M8448XD Pathological fracture, other site, subsequent encounter for fracture with routine healing: Secondary | ICD-10-CM | POA: Diagnosis not present

## 2022-03-09 DIAGNOSIS — Z7401 Bed confinement status: Secondary | ICD-10-CM | POA: Diagnosis not present

## 2022-03-09 DIAGNOSIS — C3491 Malignant neoplasm of unspecified part of right bronchus or lung: Secondary | ICD-10-CM | POA: Diagnosis not present

## 2022-03-09 DIAGNOSIS — R1111 Vomiting without nausea: Secondary | ICD-10-CM | POA: Diagnosis not present

## 2022-03-09 DIAGNOSIS — C782 Secondary malignant neoplasm of pleura: Secondary | ICD-10-CM | POA: Diagnosis not present

## 2022-03-09 DIAGNOSIS — G8929 Other chronic pain: Secondary | ICD-10-CM | POA: Diagnosis not present

## 2022-03-09 DIAGNOSIS — Z510 Encounter for antineoplastic radiation therapy: Secondary | ICD-10-CM | POA: Diagnosis not present

## 2022-03-09 DIAGNOSIS — R531 Weakness: Secondary | ICD-10-CM | POA: Diagnosis not present

## 2022-03-09 DIAGNOSIS — C7951 Secondary malignant neoplasm of bone: Secondary | ICD-10-CM | POA: Diagnosis not present

## 2022-03-09 DIAGNOSIS — S22060A Wedge compression fracture of T7-T8 vertebra, initial encounter for closed fracture: Secondary | ICD-10-CM | POA: Diagnosis not present

## 2022-03-09 DIAGNOSIS — R627 Adult failure to thrive: Secondary | ICD-10-CM | POA: Diagnosis not present

## 2022-03-09 DIAGNOSIS — R262 Difficulty in walking, not elsewhere classified: Secondary | ICD-10-CM | POA: Diagnosis not present

## 2022-03-09 LAB — GLUCOSE, CAPILLARY
Glucose-Capillary: 202 mg/dL — ABNORMAL HIGH (ref 70–99)
Glucose-Capillary: 193 mg/dL — ABNORMAL HIGH (ref 70–99)
Glucose-Capillary: 230 mg/dL — ABNORMAL HIGH (ref 70–99)

## 2022-03-09 LAB — RAD ONC ARIA SESSION SUMMARY

## 2022-03-09 NOTE — Progress Notes (Signed)
OT Cancellation Note  Patient Details Name: Dawn [name] MRN: [id number] DOB: December 20, 1940   Cancelled Treatment:    Reason Eval/Treat Not Completed: Patient at procedure or test/ unavailable Patient is off hall for radiation at this time. OT to continue to follow and check back as schedule will allow.  Sharyn Blitz OTR/L, MS Acute Rehabilitation Department Office# [phone] Pager# [phone]  03/09/2022, 12:31 PM

## 2022-03-09 NOTE — Progress Notes (Signed)
[facility]) Hospital Liaison note:  Notified via Epic workque from Dr. Dorcas Carrow of request for [facility] Palliative Care services. Will continue to follow for disposition.  Please call with any outpatient palliative questions or concerns.  Thank you for the opportunity to participate in this patient's care.  Thank you, Abran Cantor, LPN [facility] Liaison [phone]

## 2022-03-10 ENCOUNTER — Ambulatory Visit
Admission: RE | Admit: 2022-03-10 | Discharge: 2022-03-10 | Disposition: A | Discharge status: Home / Self Care | Payer: Medicare Other | Source: Ambulatory Visit | Attending: Radiation Oncology | Admitting: Radiation Oncology

## 2022-03-10 ENCOUNTER — Other Ambulatory Visit: Payer: Self-pay

## 2022-03-10 DIAGNOSIS — Z510 Encounter for antineoplastic radiation therapy: Secondary | ICD-10-CM | POA: Diagnosis not present

## 2022-03-10 DIAGNOSIS — C3491 Malignant neoplasm of unspecified part of right bronchus or lung: Secondary | ICD-10-CM | POA: Diagnosis not present

## 2022-03-10 DIAGNOSIS — C3490 Malignant neoplasm of unspecified part of unspecified bronchus or lung: Secondary | ICD-10-CM | POA: Diagnosis not present

## 2022-03-10 DIAGNOSIS — C7951 Secondary malignant neoplasm of bone: Secondary | ICD-10-CM | POA: Diagnosis not present

## 2022-03-10 LAB — RAD ONC ARIA SESSION SUMMARY

## 2022-03-11 ENCOUNTER — Ambulatory Visit
Admission: RE | Admit: 2022-03-11 | Discharge: 2022-03-11 | Disposition: A | Discharge status: Home / Self Care | Payer: Medicare Other | Source: Ambulatory Visit | Attending: Radiation Oncology | Admitting: Radiation Oncology

## 2022-03-11 ENCOUNTER — Other Ambulatory Visit: Payer: Self-pay

## 2022-03-11 DIAGNOSIS — M6281 Muscle weakness (generalized): Secondary | ICD-10-CM | POA: Diagnosis not present

## 2022-03-11 DIAGNOSIS — C782 Secondary malignant neoplasm of pleura: Secondary | ICD-10-CM | POA: Diagnosis not present

## 2022-03-11 DIAGNOSIS — C7951 Secondary malignant neoplasm of bone: Secondary | ICD-10-CM | POA: Diagnosis not present

## 2022-03-11 DIAGNOSIS — S22060A Wedge compression fracture of T7-T8 vertebra, initial encounter for closed fracture: Secondary | ICD-10-CM | POA: Diagnosis not present

## 2022-03-11 DIAGNOSIS — C3490 Malignant neoplasm of unspecified part of unspecified bronchus or lung: Secondary | ICD-10-CM | POA: Diagnosis not present

## 2022-03-11 DIAGNOSIS — Z510 Encounter for antineoplastic radiation therapy: Secondary | ICD-10-CM | POA: Diagnosis not present

## 2022-03-11 DIAGNOSIS — R262 Difficulty in walking, not elsewhere classified: Secondary | ICD-10-CM | POA: Diagnosis not present

## 2022-03-11 DIAGNOSIS — C3491 Malignant neoplasm of unspecified part of right bronchus or lung: Secondary | ICD-10-CM | POA: Diagnosis not present

## 2022-03-11 DIAGNOSIS — R627 Adult failure to thrive: Secondary | ICD-10-CM | POA: Diagnosis not present

## 2022-03-11 DIAGNOSIS — Z789 Other specified health status: Secondary | ICD-10-CM | POA: Diagnosis not present

## 2022-03-11 DIAGNOSIS — J449 Chronic obstructive pulmonary disease, unspecified: Secondary | ICD-10-CM | POA: Diagnosis not present

## 2022-03-11 DIAGNOSIS — K219 Gastro-esophageal reflux disease without esophagitis: Secondary | ICD-10-CM | POA: Diagnosis not present

## 2022-03-11 DIAGNOSIS — E118 Type 2 diabetes mellitus with unspecified complications: Secondary | ICD-10-CM | POA: Diagnosis not present

## 2022-03-11 DIAGNOSIS — R5381 Other malaise: Secondary | ICD-10-CM | POA: Diagnosis not present

## 2022-03-11 DIAGNOSIS — G8929 Other chronic pain: Secondary | ICD-10-CM | POA: Diagnosis not present

## 2022-03-11 DIAGNOSIS — M8448XD Pathological fracture, other site, subsequent encounter for fracture with routine healing: Secondary | ICD-10-CM | POA: Diagnosis not present

## 2022-03-11 LAB — RAD ONC ARIA SESSION SUMMARY

## 2022-03-12 ENCOUNTER — Other Ambulatory Visit: Payer: Self-pay

## 2022-03-12 ENCOUNTER — Ambulatory Visit
Admission: RE | Admit: 2022-03-12 | Discharge: 2022-03-12 | Disposition: A | Discharge status: Home / Self Care | Payer: Medicare Other | Source: Ambulatory Visit | Attending: Radiation Oncology | Admitting: Radiation Oncology

## 2022-03-12 ENCOUNTER — Encounter: Payer: Self-pay | Admitting: Urology

## 2022-03-12 DIAGNOSIS — C3491 Malignant neoplasm of unspecified part of right bronchus or lung: Secondary | ICD-10-CM | POA: Diagnosis not present

## 2022-03-12 DIAGNOSIS — C7951 Secondary malignant neoplasm of bone: Secondary | ICD-10-CM | POA: Diagnosis not present

## 2022-03-12 DIAGNOSIS — C3490 Malignant neoplasm of unspecified part of unspecified bronchus or lung: Secondary | ICD-10-CM | POA: Diagnosis not present

## 2022-03-12 DIAGNOSIS — Z510 Encounter for antineoplastic radiation therapy: Secondary | ICD-10-CM | POA: Diagnosis not present

## 2022-03-12 LAB — RAD ONC ARIA SESSION SUMMARY

## 2022-03-15 ENCOUNTER — Ambulatory Visit
Admission: RE | Admit: 2022-03-15 | Discharge: 2022-03-15 | Disposition: A | Discharge status: Home / Self Care | Payer: Medicare Other | Source: Ambulatory Visit | Attending: Radiation Oncology | Admitting: Radiation Oncology

## 2022-03-15 ENCOUNTER — Other Ambulatory Visit: Payer: Self-pay

## 2022-03-15 ENCOUNTER — Ambulatory Visit: Payer: Medicare Other

## 2022-03-15 VITALS — BP 172/86 | HR 98 | Temp 98.0°F

## 2022-03-15 DIAGNOSIS — C3490 Malignant neoplasm of unspecified part of unspecified bronchus or lung: Secondary | ICD-10-CM | POA: Diagnosis not present

## 2022-03-15 DIAGNOSIS — G8929 Other chronic pain: Secondary | ICD-10-CM | POA: Diagnosis not present

## 2022-03-15 DIAGNOSIS — R5381 Other malaise: Secondary | ICD-10-CM | POA: Diagnosis not present

## 2022-03-15 DIAGNOSIS — C7951 Secondary malignant neoplasm of bone: Secondary | ICD-10-CM | POA: Diagnosis not present

## 2022-03-15 DIAGNOSIS — R262 Difficulty in walking, not elsewhere classified: Secondary | ICD-10-CM | POA: Diagnosis not present

## 2022-03-15 DIAGNOSIS — J449 Chronic obstructive pulmonary disease, unspecified: Secondary | ICD-10-CM | POA: Diagnosis not present

## 2022-03-15 DIAGNOSIS — M6281 Muscle weakness (generalized): Secondary | ICD-10-CM | POA: Diagnosis not present

## 2022-03-15 DIAGNOSIS — R627 Adult failure to thrive: Secondary | ICD-10-CM | POA: Diagnosis not present

## 2022-03-15 DIAGNOSIS — C342 Malignant neoplasm of middle lobe, bronchus or lung: Secondary | ICD-10-CM | POA: Diagnosis not present

## 2022-03-15 DIAGNOSIS — M8448XD Pathological fracture, other site, subsequent encounter for fracture with routine healing: Secondary | ICD-10-CM | POA: Diagnosis not present

## 2022-03-15 MED ORDER — LORAZEPAM 1 MG PO TABS
1.0000 mg | ORAL_TABLET | ORAL | Status: AC
  Administered 2022-03-15: 1 mg via SUBLINGUAL
  Filled 2022-03-15: qty 1

## 2022-03-15 NOTE — Progress Notes (Signed)
Patient brought to the nursing clinic from radiation therapy with complaints nausea, unable to receive her radiation treatment.  Per MD order, patient was given 1 mg SL ativan.  Patient reports she feels a little better and would like to have her radiation treatment today.  Patient transported to treatment machine by staff.  BP (!) 172/86   Pulse 98   Temp 98 F (36.7 C)   SpO2 98%    Addylynn Balin M. Leonie Green, BSN

## 2022-03-17 ENCOUNTER — Ambulatory Visit: Payer: Medicare Other

## 2022-03-17 DIAGNOSIS — M8448XD Pathological fracture, other site, subsequent encounter for fracture with routine healing: Secondary | ICD-10-CM | POA: Diagnosis not present

## 2022-03-17 DIAGNOSIS — R262 Difficulty in walking, not elsewhere classified: Secondary | ICD-10-CM | POA: Diagnosis not present

## 2022-03-17 DIAGNOSIS — M6281 Muscle weakness (generalized): Secondary | ICD-10-CM | POA: Diagnosis not present

## 2022-03-17 DIAGNOSIS — R627 Adult failure to thrive: Secondary | ICD-10-CM | POA: Diagnosis not present

## 2022-03-17 DIAGNOSIS — C7951 Secondary malignant neoplasm of bone: Secondary | ICD-10-CM | POA: Diagnosis not present

## 2022-03-17 DIAGNOSIS — G8929 Other chronic pain: Secondary | ICD-10-CM | POA: Diagnosis not present

## 2022-03-17 DIAGNOSIS — C3490 Malignant neoplasm of unspecified part of unspecified bronchus or lung: Secondary | ICD-10-CM | POA: Diagnosis not present

## 2022-03-17 DIAGNOSIS — R5381 Other malaise: Secondary | ICD-10-CM | POA: Diagnosis not present

## 2022-03-17 DIAGNOSIS — J449 Chronic obstructive pulmonary disease, unspecified: Secondary | ICD-10-CM | POA: Diagnosis not present

## 2022-03-18 ENCOUNTER — Ambulatory Visit: Payer: Medicare Other

## 2022-03-18 DIAGNOSIS — C7951 Secondary malignant neoplasm of bone: Secondary | ICD-10-CM | POA: Diagnosis not present

## 2022-03-18 DIAGNOSIS — M8448XD Pathological fracture, other site, subsequent encounter for fracture with routine healing: Secondary | ICD-10-CM | POA: Diagnosis not present

## 2022-03-18 DIAGNOSIS — G8929 Other chronic pain: Secondary | ICD-10-CM | POA: Diagnosis not present

## 2022-03-18 DIAGNOSIS — R262 Difficulty in walking, not elsewhere classified: Secondary | ICD-10-CM | POA: Diagnosis not present

## 2022-03-18 DIAGNOSIS — R109 Unspecified abdominal pain: Secondary | ICD-10-CM | POA: Diagnosis not present

## 2022-03-18 DIAGNOSIS — M6281 Muscle weakness (generalized): Secondary | ICD-10-CM | POA: Diagnosis not present

## 2022-03-18 DIAGNOSIS — J449 Chronic obstructive pulmonary disease, unspecified: Secondary | ICD-10-CM | POA: Diagnosis not present

## 2022-03-18 DIAGNOSIS — R1111 Vomiting without nausea: Secondary | ICD-10-CM | POA: Diagnosis not present

## 2022-03-18 DIAGNOSIS — C3490 Malignant neoplasm of unspecified part of unspecified bronchus or lung: Secondary | ICD-10-CM | POA: Diagnosis not present

## 2022-03-18 DIAGNOSIS — R627 Adult failure to thrive: Secondary | ICD-10-CM | POA: Diagnosis not present

## 2022-03-18 DIAGNOSIS — R5381 Other malaise: Secondary | ICD-10-CM | POA: Diagnosis not present

## 2022-03-19 ENCOUNTER — Telehealth: Payer: Self-pay | Admitting: Radiation Oncology

## 2022-03-19 ENCOUNTER — Ambulatory Visit: Payer: Medicare Other

## 2022-03-19 DIAGNOSIS — C782 Secondary malignant neoplasm of pleura: Secondary | ICD-10-CM | POA: Diagnosis not present

## 2022-03-19 DIAGNOSIS — R112 Nausea with vomiting, unspecified: Secondary | ICD-10-CM | POA: Diagnosis not present

## 2022-03-19 DIAGNOSIS — J449 Chronic obstructive pulmonary disease, unspecified: Secondary | ICD-10-CM | POA: Diagnosis not present

## 2022-03-19 DIAGNOSIS — R079 Chest pain, unspecified: Secondary | ICD-10-CM | POA: Diagnosis not present

## 2022-03-19 DIAGNOSIS — R109 Unspecified abdominal pain: Secondary | ICD-10-CM | POA: Diagnosis not present

## 2022-03-19 DIAGNOSIS — M8448XA Pathological fracture, other site, initial encounter for fracture: Secondary | ICD-10-CM | POA: Diagnosis not present

## 2022-03-19 DIAGNOSIS — F32A Depression, unspecified: Secondary | ICD-10-CM | POA: Diagnosis not present

## 2022-03-19 DIAGNOSIS — E118 Type 2 diabetes mellitus with unspecified complications: Secondary | ICD-10-CM | POA: Diagnosis not present

## 2022-03-19 NOTE — Telephone Encounter (Signed)
The patient's son called stating she is not feeling well, and will not be able to come in for 7/7 TX. L2 notified.

## 2022-03-22 ENCOUNTER — Ambulatory Visit: Payer: Medicare Other

## 2022-03-22 DIAGNOSIS — R627 Adult failure to thrive: Secondary | ICD-10-CM | POA: Diagnosis not present

## 2022-03-22 DIAGNOSIS — C3490 Malignant neoplasm of unspecified part of unspecified bronchus or lung: Secondary | ICD-10-CM | POA: Diagnosis not present

## 2022-03-22 DIAGNOSIS — R262 Difficulty in walking, not elsewhere classified: Secondary | ICD-10-CM | POA: Diagnosis not present

## 2022-03-22 DIAGNOSIS — R5381 Other malaise: Secondary | ICD-10-CM | POA: Diagnosis not present

## 2022-03-22 DIAGNOSIS — M6281 Muscle weakness (generalized): Secondary | ICD-10-CM | POA: Diagnosis not present

## 2022-03-22 DIAGNOSIS — J449 Chronic obstructive pulmonary disease, unspecified: Secondary | ICD-10-CM | POA: Diagnosis not present

## 2022-03-22 DIAGNOSIS — G8929 Other chronic pain: Secondary | ICD-10-CM | POA: Diagnosis not present

## 2022-03-22 DIAGNOSIS — R109 Unspecified abdominal pain: Secondary | ICD-10-CM | POA: Diagnosis not present

## 2022-03-22 DIAGNOSIS — M8448XD Pathological fracture, other site, subsequent encounter for fracture with routine healing: Secondary | ICD-10-CM | POA: Diagnosis not present

## 2022-03-22 DIAGNOSIS — R1111 Vomiting without nausea: Secondary | ICD-10-CM | POA: Diagnosis not present

## 2022-03-22 DIAGNOSIS — C7951 Secondary malignant neoplasm of bone: Secondary | ICD-10-CM | POA: Diagnosis not present

## 2022-03-23 ENCOUNTER — Ambulatory Visit: Payer: Medicare Other

## 2022-03-24 ENCOUNTER — Non-Acute Institutional Stay: Payer: Self-pay | Admitting: Internal Medicine

## 2022-03-24 ENCOUNTER — Ambulatory Visit: Payer: Medicare Other

## 2022-03-24 DIAGNOSIS — G8929 Other chronic pain: Secondary | ICD-10-CM | POA: Diagnosis not present

## 2022-03-24 DIAGNOSIS — R262 Difficulty in walking, not elsewhere classified: Secondary | ICD-10-CM | POA: Diagnosis not present

## 2022-03-24 DIAGNOSIS — R1111 Vomiting without nausea: Secondary | ICD-10-CM | POA: Diagnosis not present

## 2022-03-24 DIAGNOSIS — R109 Unspecified abdominal pain: Secondary | ICD-10-CM | POA: Diagnosis not present

## 2022-03-24 DIAGNOSIS — M6281 Muscle weakness (generalized): Secondary | ICD-10-CM | POA: Diagnosis not present

## 2022-03-24 DIAGNOSIS — J449 Chronic obstructive pulmonary disease, unspecified: Secondary | ICD-10-CM | POA: Diagnosis not present

## 2022-03-24 DIAGNOSIS — C7951 Secondary malignant neoplasm of bone: Secondary | ICD-10-CM | POA: Diagnosis not present

## 2022-03-24 DIAGNOSIS — R627 Adult failure to thrive: Secondary | ICD-10-CM | POA: Diagnosis not present

## 2022-03-24 DIAGNOSIS — R5381 Other malaise: Secondary | ICD-10-CM | POA: Diagnosis not present

## 2022-03-24 DIAGNOSIS — C3490 Malignant neoplasm of unspecified part of unspecified bronchus or lung: Secondary | ICD-10-CM | POA: Diagnosis not present

## 2022-03-24 DIAGNOSIS — M8448XD Pathological fracture, other site, subsequent encounter for fracture with routine healing: Secondary | ICD-10-CM | POA: Diagnosis not present

## 2022-03-25 ENCOUNTER — Ambulatory Visit: Payer: Medicare Other

## 2022-03-25 ENCOUNTER — Other Ambulatory Visit: Payer: Self-pay | Admitting: *Deleted

## 2022-03-25 DIAGNOSIS — K219 Gastro-esophageal reflux disease without esophagitis: Secondary | ICD-10-CM | POA: Diagnosis not present

## 2022-03-25 DIAGNOSIS — J449 Chronic obstructive pulmonary disease, unspecified: Secondary | ICD-10-CM | POA: Diagnosis not present

## 2022-03-25 DIAGNOSIS — E118 Type 2 diabetes mellitus with unspecified complications: Secondary | ICD-10-CM | POA: Diagnosis not present

## 2022-03-25 DIAGNOSIS — S22060A Wedge compression fracture of T7-T8 vertebra, initial encounter for closed fracture: Secondary | ICD-10-CM | POA: Diagnosis not present

## 2022-03-25 DIAGNOSIS — C782 Secondary malignant neoplasm of pleura: Secondary | ICD-10-CM | POA: Diagnosis not present

## 2022-03-25 NOTE — Patient Outreach (Signed)
Per [name] eligible member currently resides in  [facility] Re[facility] SNF.  Screening for potential care coordination/care management services as a benefit of Dawn [name]'s insurance plan and PCP.  Dawn [name] PCP's office at [facility] has [facility] care management services.  Facility site visit to [facility] skilled nursing facility. Met with Dawn [name] and Dawn [name], SNF social workers. Dawn [name] is from home alone. Caregiver list has been provided to son. To transition home soon. Dawn [name] will have [facility] home health. Dawn [name] reports DME - hospital bed and wheelchair has been ordered. Has meeting scheduled with [facility] to discuss options on Friday, July 14th.   Will send secure notification to [facility] care management upon SNF discharge if needed.    Will continue to follow.    Dawn Rolling, MSN, RN,BSN [name] Acute Care Coordinator [phone] [facility]) [phone]  (Toll free office)

## 2022-03-26 ENCOUNTER — Encounter: Payer: Self-pay | Admitting: Physician Assistant

## 2022-03-26 ENCOUNTER — Non-Acute Institutional Stay: Payer: Medicare Other

## 2022-03-26 ENCOUNTER — Encounter: Payer: Self-pay | Admitting: Internal Medicine

## 2022-03-26 ENCOUNTER — Ambulatory Visit: Payer: Medicare Other

## 2022-03-26 ENCOUNTER — Non-Acute Institutional Stay: Payer: Medicare Other | Admitting: *Deleted

## 2022-03-26 ENCOUNTER — Telehealth: Payer: Self-pay

## 2022-03-26 DIAGNOSIS — Z515 Encounter for palliative care: Secondary | ICD-10-CM

## 2022-03-26 DIAGNOSIS — M6281 Muscle weakness (generalized): Secondary | ICD-10-CM | POA: Diagnosis not present

## 2022-03-26 DIAGNOSIS — M8448XD Pathological fracture, other site, subsequent encounter for fracture with routine healing: Secondary | ICD-10-CM | POA: Diagnosis not present

## 2022-03-26 DIAGNOSIS — R2681 Unsteadiness on feet: Secondary | ICD-10-CM | POA: Diagnosis not present

## 2022-03-26 NOTE — Telephone Encounter (Signed)
RN returned call to patient son [name] who informed this writing of Dawn [name] not feeling well and will not be in for treatment this afternoon.

## 2022-03-26 NOTE — Progress Notes (Signed)
[facility] PALLIATIVE C[facility] RN NOTE  PATIENT NAME: Dawn [name] DOB: [birth year] MRN: [id number]  PRIMARY CARE PROVIDER: Lawerance Cruel, MD  RESPONSIBLE PARTY: [name] (son) Acct ID - Guarantor Home Phone Work Phone Relationship Acct Type  [id number] - [name], [name] [phone]  Self P/F     [address] [place], [place] [address]   RN/SW team completed joint initial palliative care consult visit with patient and son today at [facility] facility.  Patient has a diagnosis of metastatic breast cancer. She had been receiving palliative radiation, but has not been able to make it to the cancer center due to weakness over the past 2 weeks.  Patient was supposed to discharge home today from the facility as her skilled days have ended, however son is unable to take her to his home and it is unsafe for her to return home alone. Son has applied for Medicaid so patient will remain at facility for at least another 30-45 days until he finds out whether Medicaid will be approved or not. She was moved to a semi-private room during visit.  Pain: Patient c/o chest pain that eventually subsided. Son says she is currently receiving Oxycodone for pain. A primary goal is for patient not to be in pain.  Cognitive: Worsening short-term memory. Very forgetful with intermittent confusion.   Respiratory: Patient c/o shortness of breath and feeling "stuffy" when she arrived in her new room. Turned on the air conditioner to cool the room off and this helped.  Mobility: She requires 1 person assistance with all ADLs. Mainly transferred to wheelchair and transported this way. She spends most of her day in bed and doesn't have the desire to do much activity. Increased sleep.  Appetite: Her appetite is poor and she is only taking in bites and sips mainly. She c/o nausea during visit today. Made staff aware.  Education provided regarding palliative vs hospice care. Based off education  son feels that hospice will be the best option and I agree. Spoke to facility SW Dickens who is going to reach out to facility doctor for a hospice order and fax to [facility] once received.   CODE STATUS: DNR ADVANCED DIRECTIVES: N MOST FORM: no PPS: 30%  Daryl Eastern, Hydrographic surveyor

## 2022-03-28 DIAGNOSIS — M8448XD Pathological fracture, other site, subsequent encounter for fracture with routine healing: Secondary | ICD-10-CM | POA: Diagnosis not present

## 2022-03-28 DIAGNOSIS — R2681 Unsteadiness on feet: Secondary | ICD-10-CM | POA: Diagnosis not present

## 2022-03-28 DIAGNOSIS — M6281 Muscle weakness (generalized): Secondary | ICD-10-CM | POA: Diagnosis not present

## 2022-03-29 ENCOUNTER — Telehealth: Payer: Self-pay

## 2022-03-29 ENCOUNTER — Ambulatory Visit: Payer: Medicare Other

## 2022-03-29 DIAGNOSIS — C782 Secondary malignant neoplasm of pleura: Secondary | ICD-10-CM | POA: Diagnosis not present

## 2022-03-29 DIAGNOSIS — R2681 Unsteadiness on feet: Secondary | ICD-10-CM | POA: Diagnosis not present

## 2022-03-29 DIAGNOSIS — S22060A Wedge compression fracture of T7-T8 vertebra, initial encounter for closed fracture: Secondary | ICD-10-CM | POA: Diagnosis not present

## 2022-03-29 DIAGNOSIS — M6281 Muscle weakness (generalized): Secondary | ICD-10-CM | POA: Diagnosis not present

## 2022-03-29 DIAGNOSIS — R079 Chest pain, unspecified: Secondary | ICD-10-CM | POA: Diagnosis not present

## 2022-03-29 DIAGNOSIS — M8448XD Pathological fracture, other site, subsequent encounter for fracture with routine healing: Secondary | ICD-10-CM | POA: Diagnosis not present

## 2022-03-29 NOTE — Telephone Encounter (Signed)
RN spoke with patient son Dawn [name] who reports that [name] isn't feeling well and will not be in for treatment this afternoon.

## 2022-03-30 ENCOUNTER — Telehealth: Payer: Self-pay

## 2022-03-30 ENCOUNTER — Ambulatory Visit: Payer: Medicare Other

## 2022-03-30 DIAGNOSIS — M6281 Muscle weakness (generalized): Secondary | ICD-10-CM | POA: Diagnosis not present

## 2022-03-30 DIAGNOSIS — M8448XD Pathological fracture, other site, subsequent encounter for fracture with routine healing: Secondary | ICD-10-CM | POA: Diagnosis not present

## 2022-03-30 DIAGNOSIS — R2681 Unsteadiness on feet: Secondary | ICD-10-CM | POA: Diagnosis not present

## 2022-03-30 NOTE — Telephone Encounter (Signed)
RN called patient (son) Dawn [name] again to follow-up had to leave message.

## 2022-03-31 ENCOUNTER — Ambulatory Visit: Payer: Medicare Other

## 2022-03-31 ENCOUNTER — Telehealth: Payer: Self-pay

## 2022-03-31 DIAGNOSIS — M8448XD Pathological fracture, other site, subsequent encounter for fracture with routine healing: Secondary | ICD-10-CM | POA: Diagnosis not present

## 2022-03-31 DIAGNOSIS — M6281 Muscle weakness (generalized): Secondary | ICD-10-CM | POA: Diagnosis not present

## 2022-03-31 DIAGNOSIS — R2681 Unsteadiness on feet: Secondary | ICD-10-CM | POA: Diagnosis not present

## 2022-03-31 NOTE — Telephone Encounter (Signed)
Dawn [name] (son) called clinic after identity verification RN spoke with him he reports at this time his mother is unable to continue treatments and would just like end treatment.  He's currently working with nursing home facility on possibly moving forward with hospice care for [name].  Will notify the radiation team of decision.  Nothing follows.

## 2022-03-31 NOTE — Telephone Encounter (Signed)
RN call (son) [name] to ask about patient continuing treatment and to see if there was medical oncology, hospice/palliative care in place once radiation treatment is completed, but unable to reach left message again.

## 2022-04-01 ENCOUNTER — Ambulatory Visit: Payer: Medicare Other

## 2022-04-01 ENCOUNTER — Other Ambulatory Visit: Payer: Self-pay | Admitting: *Deleted

## 2022-04-01 DIAGNOSIS — M6281 Muscle weakness (generalized): Secondary | ICD-10-CM | POA: Diagnosis not present

## 2022-04-01 DIAGNOSIS — M8448XD Pathological fracture, other site, subsequent encounter for fracture with routine healing: Secondary | ICD-10-CM | POA: Diagnosis not present

## 2022-04-01 DIAGNOSIS — R2681 Unsteadiness on feet: Secondary | ICD-10-CM | POA: Diagnosis not present

## 2022-04-01 NOTE — Patient Outreach (Signed)
[facility] Post- Acute Care Coordinator follow up. Per [place] eligible member currently resides in [facility] SN[facility].    PCP with Sadie Haber at [facility] has [facility] care management services.   Facility site visit to skilled nursing facility. Met with SNF social workers [place], Dellis Anes, and Verdis Frederickson, Marketing executive. Ms. Motz is under second level appeal. Ms. Handler is followed by palliative care team. Will transition to long term care and will have hospice services post SNF.   No identifiable care coordination/care management needs.    Marthenia Rolling, MSN, RN,BSN [place] Acute Care Coordinator [phone] [facility]) [phone]  (Toll free office)

## 2022-04-02 ENCOUNTER — Ambulatory Visit: Payer: Medicare Other

## 2022-04-02 DIAGNOSIS — R2681 Unsteadiness on feet: Secondary | ICD-10-CM | POA: Diagnosis not present

## 2022-04-02 DIAGNOSIS — M8448XD Pathological fracture, other site, subsequent encounter for fracture with routine healing: Secondary | ICD-10-CM | POA: Diagnosis not present

## 2022-04-02 DIAGNOSIS — M6281 Muscle weakness (generalized): Secondary | ICD-10-CM | POA: Diagnosis not present

## 2022-04-03 DIAGNOSIS — M6281 Muscle weakness (generalized): Secondary | ICD-10-CM | POA: Diagnosis not present

## 2022-04-03 DIAGNOSIS — M8448XD Pathological fracture, other site, subsequent encounter for fracture with routine healing: Secondary | ICD-10-CM | POA: Diagnosis not present

## 2022-04-03 DIAGNOSIS — R2681 Unsteadiness on feet: Secondary | ICD-10-CM | POA: Diagnosis not present

## 2022-04-04 DIAGNOSIS — R2681 Unsteadiness on feet: Secondary | ICD-10-CM | POA: Diagnosis not present

## 2022-04-04 DIAGNOSIS — M6281 Muscle weakness (generalized): Secondary | ICD-10-CM | POA: Diagnosis not present

## 2022-04-04 DIAGNOSIS — M8448XD Pathological fracture, other site, subsequent encounter for fracture with routine healing: Secondary | ICD-10-CM | POA: Diagnosis not present

## 2022-04-05 ENCOUNTER — Other Ambulatory Visit: Payer: Self-pay

## 2022-04-05 DIAGNOSIS — M6281 Muscle weakness (generalized): Secondary | ICD-10-CM | POA: Diagnosis not present

## 2022-04-05 DIAGNOSIS — R2681 Unsteadiness on feet: Secondary | ICD-10-CM | POA: Diagnosis not present

## 2022-04-05 DIAGNOSIS — M8448XD Pathological fracture, other site, subsequent encounter for fracture with routine healing: Secondary | ICD-10-CM | POA: Diagnosis not present

## 2022-04-06 DIAGNOSIS — E118 Type 2 diabetes mellitus with unspecified complications: Secondary | ICD-10-CM | POA: Diagnosis not present

## 2022-04-06 DIAGNOSIS — M8448XD Pathological fracture, other site, subsequent encounter for fracture with routine healing: Secondary | ICD-10-CM | POA: Diagnosis not present

## 2022-04-06 DIAGNOSIS — R2681 Unsteadiness on feet: Secondary | ICD-10-CM | POA: Diagnosis not present

## 2022-04-06 DIAGNOSIS — K5900 Constipation, unspecified: Secondary | ICD-10-CM | POA: Diagnosis not present

## 2022-04-06 DIAGNOSIS — M6281 Muscle weakness (generalized): Secondary | ICD-10-CM | POA: Diagnosis not present

## 2022-04-06 DIAGNOSIS — R112 Nausea with vomiting, unspecified: Secondary | ICD-10-CM | POA: Diagnosis not present

## 2022-04-06 DIAGNOSIS — J449 Chronic obstructive pulmonary disease, unspecified: Secondary | ICD-10-CM | POA: Diagnosis not present

## 2022-04-07 DIAGNOSIS — R2681 Unsteadiness on feet: Secondary | ICD-10-CM | POA: Diagnosis not present

## 2022-04-07 DIAGNOSIS — K5900 Constipation, unspecified: Secondary | ICD-10-CM | POA: Diagnosis not present

## 2022-04-07 DIAGNOSIS — K56609 Unspecified intestinal obstruction, unspecified as to partial versus complete obstruction: Secondary | ICD-10-CM | POA: Diagnosis not present

## 2022-04-07 DIAGNOSIS — R262 Difficulty in walking, not elsewhere classified: Secondary | ICD-10-CM | POA: Diagnosis not present

## 2022-04-07 DIAGNOSIS — R627 Adult failure to thrive: Secondary | ICD-10-CM | POA: Diagnosis not present

## 2022-04-07 DIAGNOSIS — R5381 Other malaise: Secondary | ICD-10-CM | POA: Diagnosis not present

## 2022-04-07 DIAGNOSIS — J449 Chronic obstructive pulmonary disease, unspecified: Secondary | ICD-10-CM | POA: Diagnosis not present

## 2022-04-07 DIAGNOSIS — M6281 Muscle weakness (generalized): Secondary | ICD-10-CM | POA: Diagnosis not present

## 2022-04-07 DIAGNOSIS — R112 Nausea with vomiting, unspecified: Secondary | ICD-10-CM | POA: Diagnosis not present

## 2022-04-07 DIAGNOSIS — C7951 Secondary malignant neoplasm of bone: Secondary | ICD-10-CM | POA: Diagnosis not present

## 2022-04-07 DIAGNOSIS — C3490 Malignant neoplasm of unspecified part of unspecified bronchus or lung: Secondary | ICD-10-CM | POA: Diagnosis not present

## 2022-04-07 DIAGNOSIS — R1111 Vomiting without nausea: Secondary | ICD-10-CM | POA: Diagnosis not present

## 2022-04-07 DIAGNOSIS — G8929 Other chronic pain: Secondary | ICD-10-CM | POA: Diagnosis not present

## 2022-04-07 DIAGNOSIS — M8448XD Pathological fracture, other site, subsequent encounter for fracture with routine healing: Secondary | ICD-10-CM | POA: Diagnosis not present

## 2022-04-07 DIAGNOSIS — R109 Unspecified abdominal pain: Secondary | ICD-10-CM | POA: Diagnosis not present

## 2022-04-08 ENCOUNTER — Other Ambulatory Visit: Payer: Self-pay | Admitting: *Deleted

## 2022-04-08 DIAGNOSIS — R627 Adult failure to thrive: Secondary | ICD-10-CM | POA: Diagnosis not present

## 2022-04-08 DIAGNOSIS — R109 Unspecified abdominal pain: Secondary | ICD-10-CM | POA: Diagnosis not present

## 2022-04-08 DIAGNOSIS — R5381 Other malaise: Secondary | ICD-10-CM | POA: Diagnosis not present

## 2022-04-08 DIAGNOSIS — M8448XD Pathological fracture, other site, subsequent encounter for fracture with routine healing: Secondary | ICD-10-CM | POA: Diagnosis not present

## 2022-04-08 DIAGNOSIS — R262 Difficulty in walking, not elsewhere classified: Secondary | ICD-10-CM | POA: Diagnosis not present

## 2022-04-08 DIAGNOSIS — C7951 Secondary malignant neoplasm of bone: Secondary | ICD-10-CM | POA: Diagnosis not present

## 2022-04-08 DIAGNOSIS — C3490 Malignant neoplasm of unspecified part of unspecified bronchus or lung: Secondary | ICD-10-CM | POA: Diagnosis not present

## 2022-04-08 DIAGNOSIS — M6281 Muscle weakness (generalized): Secondary | ICD-10-CM | POA: Diagnosis not present

## 2022-04-08 DIAGNOSIS — J449 Chronic obstructive pulmonary disease, unspecified: Secondary | ICD-10-CM | POA: Diagnosis not present

## 2022-04-08 DIAGNOSIS — I10 Essential (primary) hypertension: Secondary | ICD-10-CM | POA: Diagnosis not present

## 2022-04-08 DIAGNOSIS — R2681 Unsteadiness on feet: Secondary | ICD-10-CM | POA: Diagnosis not present

## 2022-04-08 DIAGNOSIS — K567 Ileus, unspecified: Secondary | ICD-10-CM | POA: Diagnosis not present

## 2022-04-08 DIAGNOSIS — G8929 Other chronic pain: Secondary | ICD-10-CM | POA: Diagnosis not present

## 2022-04-08 DIAGNOSIS — R1111 Vomiting without nausea: Secondary | ICD-10-CM | POA: Diagnosis not present

## 2022-04-08 NOTE — Patient Outreach (Signed)
THN Post- Acute Care Coordinator follow up. Ms. Guyett resides in [facility] Rehab SNF.  Facility site visit to [facility] skilled nursing facility. Met with Irine Seal and Kirstin, SNF social workers. Irine Seal reports Ms. Haffey remains in second level appeal. Nursing is addressing pain control. Plan is to transition to hospice upon transitioning to LTC.    Marthenia Rolling, MSN, RN,BSN [place] Acute Care Coordinator [phone] [facility]) [phone]  (Toll free office)

## 2022-04-09 DIAGNOSIS — R109 Unspecified abdominal pain: Secondary | ICD-10-CM | POA: Diagnosis not present

## 2022-04-09 DIAGNOSIS — R112 Nausea with vomiting, unspecified: Secondary | ICD-10-CM | POA: Diagnosis not present

## 2022-04-09 DIAGNOSIS — J449 Chronic obstructive pulmonary disease, unspecified: Secondary | ICD-10-CM | POA: Diagnosis not present

## 2022-04-09 DIAGNOSIS — K5900 Constipation, unspecified: Secondary | ICD-10-CM | POA: Diagnosis not present

## 2022-04-09 DIAGNOSIS — C782 Secondary malignant neoplasm of pleura: Secondary | ICD-10-CM | POA: Diagnosis not present

## 2022-04-11 DIAGNOSIS — R109 Unspecified abdominal pain: Secondary | ICD-10-CM | POA: Diagnosis not present

## 2022-04-12 DIAGNOSIS — I10 Essential (primary) hypertension: Secondary | ICD-10-CM | POA: Diagnosis not present

## 2022-04-15 ENCOUNTER — Other Ambulatory Visit: Payer: Self-pay | Admitting: *Deleted

## 2022-04-15 NOTE — Patient Outreach (Signed)
[place] Coordinator follow up. Ms. Huwe resides in [facility] and Rehab.  Verified with Irine Seal, SNF SW Ms. Klemz has transitioned to LTC with hospice services.  No identifiable care coordination/care management services.   Marthenia Rolling, MSN, RN,BSN [place] Acute Care Coordinator [phone] [facility]) [phone]  (Toll free office)

## 2022-04-15 NOTE — Progress Notes (Signed)
COMMUNITY PALLIATIVE CARE SW NOTE  PATIENT NAME: Dawn [name] DOB: 1940-11-14 MRN: [id number]  PRIMARY CARE PROVIDER: Lawerance Cruel, MD  RESPONSIBLE PARTY:  Acct ID - Guarantor Home Phone Work Phone Relationship Acct Type  [id number] [facility] R [phone]  Self P/F     [place], [place], [place] [address]   SOCIAL WORK VISIT  SW and RN-M. Nadara Mustard competed a joint visit with patient at [facility] for an initial palliative care visit. Patient and her son -Dawn [name] was present for the visit. Patient has a diagnosis of metastatic breast cancer for which she has been receiving palliative radiation. However she has not received any treatments in two weeks due to weakness.  Patient is scheduled to  be discharged home today from the facility due to her skilled days ending. Her son is unable to take her to his home and it is an unsafe  discharge for her to return home alone. Her son has applied for Medicaid, therefore the patient will remain at facility for at least another 30-45 pending medicaid approval.  Patient  was moved to a semi-private room during visit.  Patient reported having pain to her stomach. She is only taking bits of food and sips of fluid. She is completely bedbound and spends most of her day napping. Patient has intermittent forgetfulness/confusion that she disguises with humor. Patient requires assistance with personal care tasks. SW observed patient having nausea and vomiting during this visit.   Patient's son was provided education regarding hospice services. He requested a hospice consult. The team will seek an order for a consult.     CODE STATUS: DNR ADVANCED DIRECTIVES: No MOST FORM COMPLETE: No HOSPICE EDUCATION PROVIDED: Yes, consult requested  Duration of visit and documentation: 60 minutes  Dawn Ruzich, LCSW

## 2022-04-19 DIAGNOSIS — M8448XA Pathological fracture, other site, initial encounter for fracture: Secondary | ICD-10-CM | POA: Diagnosis not present

## 2022-04-19 DIAGNOSIS — J449 Chronic obstructive pulmonary disease, unspecified: Secondary | ICD-10-CM | POA: Diagnosis not present

## 2022-04-20 DIAGNOSIS — C3490 Malignant neoplasm of unspecified part of unspecified bronchus or lung: Secondary | ICD-10-CM | POA: Diagnosis not present

## 2022-04-20 DIAGNOSIS — R5381 Other malaise: Secondary | ICD-10-CM | POA: Diagnosis not present

## 2022-04-20 DIAGNOSIS — M8448XD Pathological fracture, other site, subsequent encounter for fracture with routine healing: Secondary | ICD-10-CM | POA: Diagnosis not present

## 2022-04-20 DIAGNOSIS — C7951 Secondary malignant neoplasm of bone: Secondary | ICD-10-CM | POA: Diagnosis not present

## 2022-04-20 DIAGNOSIS — M6281 Muscle weakness (generalized): Secondary | ICD-10-CM | POA: Diagnosis not present

## 2022-04-20 DIAGNOSIS — R1111 Vomiting without nausea: Secondary | ICD-10-CM | POA: Diagnosis not present

## 2022-04-20 DIAGNOSIS — R109 Unspecified abdominal pain: Secondary | ICD-10-CM | POA: Diagnosis not present

## 2022-04-20 DIAGNOSIS — G8929 Other chronic pain: Secondary | ICD-10-CM | POA: Diagnosis not present

## 2022-04-20 DIAGNOSIS — J449 Chronic obstructive pulmonary disease, unspecified: Secondary | ICD-10-CM | POA: Diagnosis not present

## 2022-04-20 DIAGNOSIS — R262 Difficulty in walking, not elsewhere classified: Secondary | ICD-10-CM | POA: Diagnosis not present

## 2022-04-20 DIAGNOSIS — R627 Adult failure to thrive: Secondary | ICD-10-CM | POA: Diagnosis not present

## 2022-04-28 ENCOUNTER — Ambulatory Visit
Admission: RE | Admit: 2022-04-28 | Discharge: 2022-04-28 | Disposition: A | Discharge status: Home / Self Care | Payer: Medicare Other | Source: Ambulatory Visit | Attending: Urology | Admitting: Urology

## 2022-05-14 NOTE — Progress Notes (Addendum)
Called patient to conduct nursing portion of telephone appointment. I spoke w/ patient's son Mr. Dawn [name] and verified his identity. He states "Ms. Dawn [name] is currently at [facility] and is declining rapidly. She will not be available for her 10:00am-2022/05/07 telephone appointment w/ Ashlyn Bruning PA-C. Please cancel this appointment and do not reschedule." I verified understanding w/ Mr. Dawn [name] and notified Ashlyn Bruning PA-C & the scheduling dept.

## 2022-05-14 DEATH — deceased

## 2022-08-26 NOTE — Progress Notes (Signed)
  Radiation Oncology         [id number]) [phone] ________________________________  Name: Dawn [name] MRN: [id number]  Date: 03/12/2022  DOB: [birth year]  End of Treatment Note  Diagnosis:   81 y.o. patient with with definite T7 and possible T12 metastases      Indication for treatment:  Palliation       Radiation treatment dates:   03/04/22 - 03/12/22  Site/dose:   The plan ws for 30 Gy in 10 fractions to T7 and T12 but treatment was ended after 7 doses due to patient health decline..   Beams/energy:   A 3D field set-up was employed with 6 MV X-rays  Narrative: The patient tolerated radiation treatment relatively well but her health was declining.  Plan: The patient has completed radiation treatment. The patient will return to radiation oncology clinic for routine followup in one month. I advised her to call or return sooner if she has any questions or concerns related to her recovery or treatment. ________________________________  Sheral Apley. Tammi Klippel, M.D.

## 2022-12-25 IMAGING — MR MR HEAD WO/W CM
13 series · 47 of 48 positions shown · IV contrast (gadavist)
Comparison: None.

CLINICAL DATA: Memory loss.  Rule out metastatic disease.

EXAM:
MRI HEAD WITHOUT AND WITH CONTRAST
TECHNIQUE: Multiplanar, multiecho pulse sequences of the brain and surrounding
structures were obtained without and with intravenous contrast.
CONTRAST:  5mL GADAVIST GADOBUTROL 1 MMOL/ML IV SOLN

[Series 5: DWI · axial · 3.0mm · 1.36mm/px · z∈[-50,+94]mm · 9 of 100 slices shown (1 of 4)]
[im 1/100]
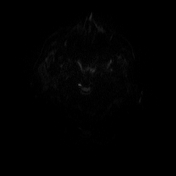
[im 13/100]
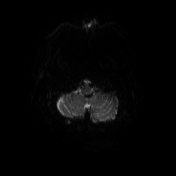
[im 25/100]
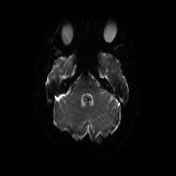
[im 38/100]
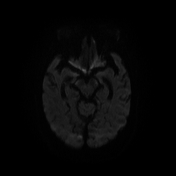
[im 50/100]
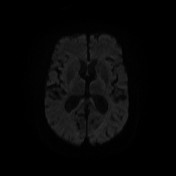
[im 62/100]
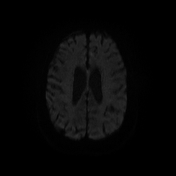
[im 75/100]
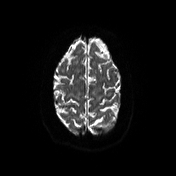
[im 87/100]
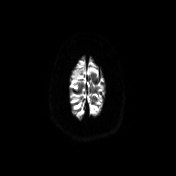
[im 100/100]
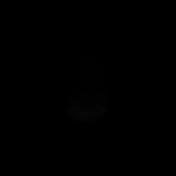

[Series 6: DWI · axial · 3.0mm · 1.36mm/px · z∈[-50,+94]mm · 4 of 50 slices shown (2 of 4)]
[im 1/50]
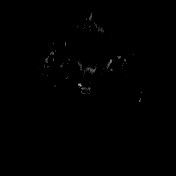
[im 17/50]
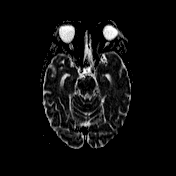
[im 33/50]
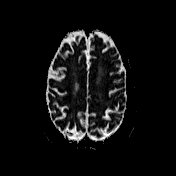
[im 50/50]
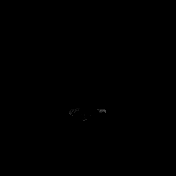

[Series 7: T1 · sagittal · 5.0mm · 0.75mm/px · 2 of 25 slices shown (1 of 2)]
[im 1/25]
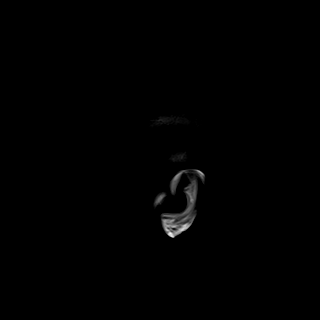
[im 25/25]
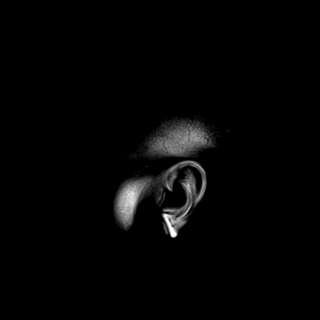

[Series 8: T2 · axial · 5.0mm · 0.62mm/px · z∈[-63,+96]mm · 2 of 26 slices shown]
[im 1/26]
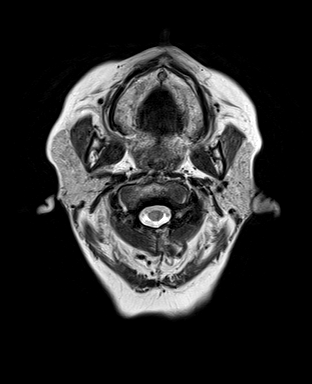
[im 26/26]
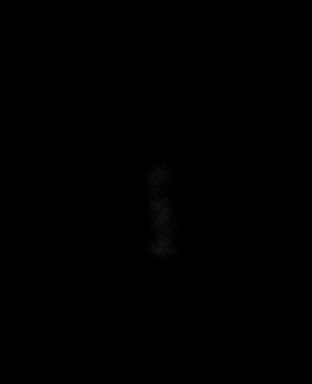

[Series 9: GRE · axial · 3.0mm · 0.45mm/px · z∈[-58,+89]mm · 4 of 51 slices shown]
[im 1/51]
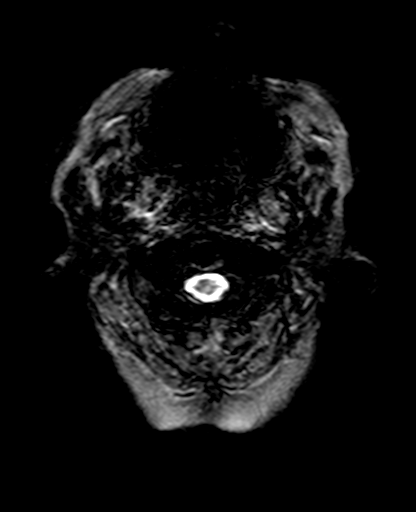
[im 17/51]
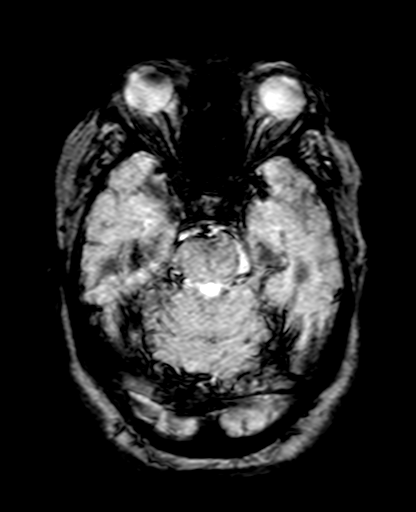
[im 34/51]
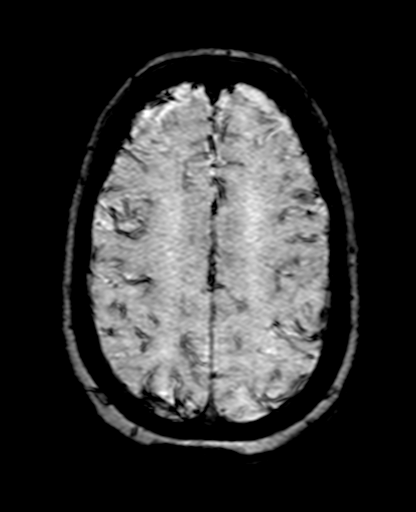
[im 51/51]
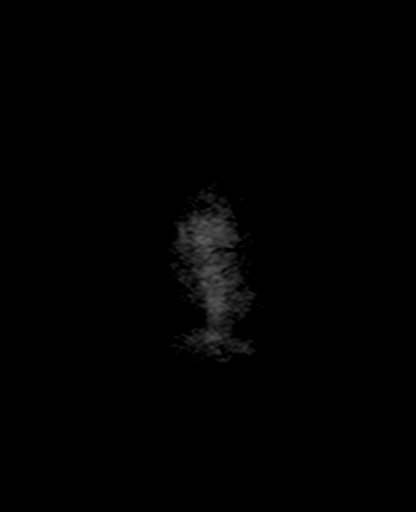

[Series 10: FLAIR · axial · 3.0mm · 0.86mm/px · z∈[-65,+82]mm · 4 of 51 slices shown]
[im 1/51]
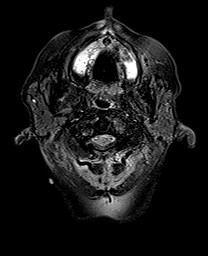
[im 17/51]
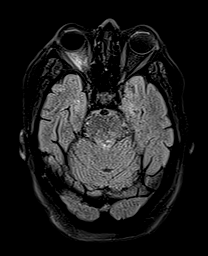
[im 34/51]
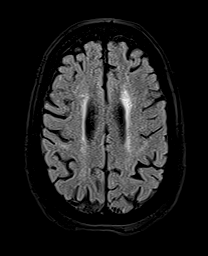
[im 51/51]
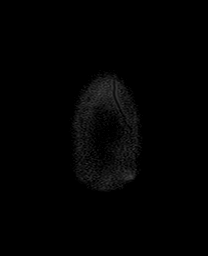

[Series 11: T1 · axial · 3.0mm · 0.45mm/px · z∈[-59,+38]mm · 3 of 51 slices shown (2 of 2)]
[im 1/51]
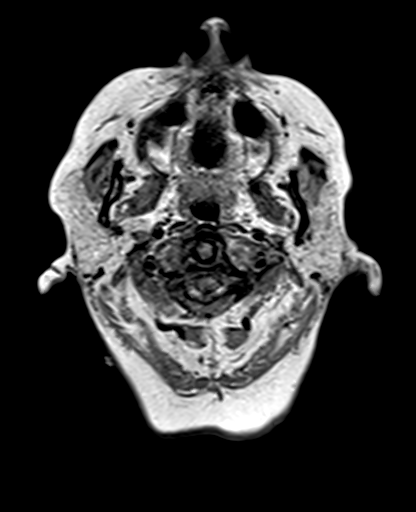
[im 17/51]
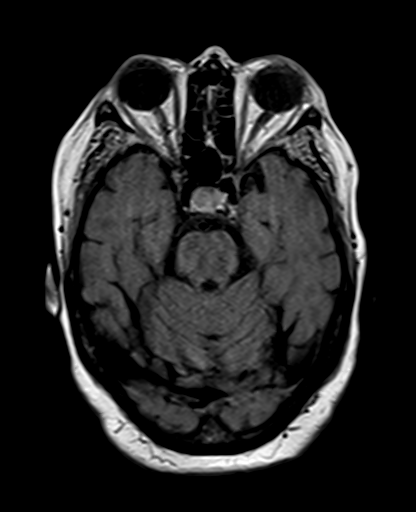
[im 34/51]
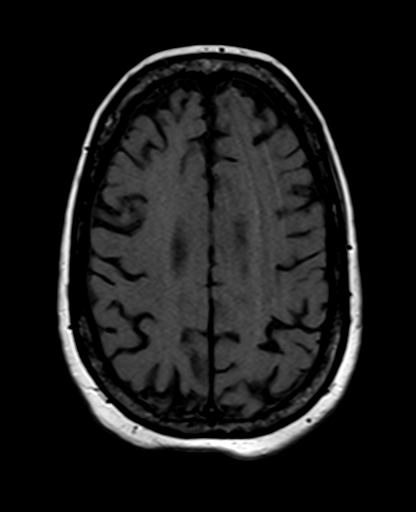

[Series 12: DWI · coronal · 5.0mm · 1.31mm/px · 5 of 64 slices shown (3 of 4)]
[im 1/64]
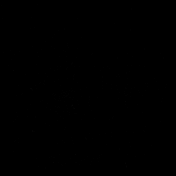
[im 16/64]
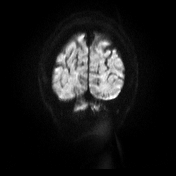
[im 32/64]
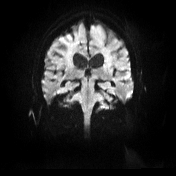
[im 48/64]
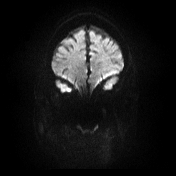
[im 64/64]
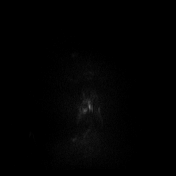

[Series 13: DWI · coronal · 5.0mm · 1.31mm/px · 2 of 30 slices shown (4 of 4)]
[im 1/30]
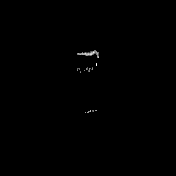
[im 30/30]
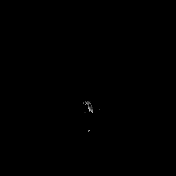

[Series 14: T2 post-contrast · coronal · 5.0mm · 0.86mm/px · 3 of 32 slices shown]
[im 1/32]
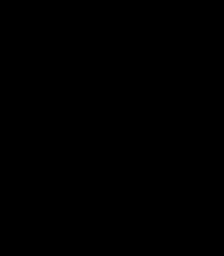
[im 16/32]
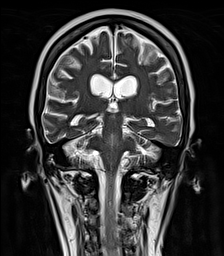
[im 32/32]
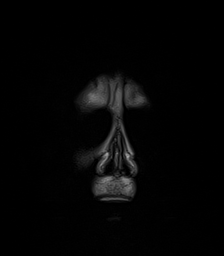

[Series 15: T1 post-contrast · axial · 3.0mm · 0.45mm/px · z∈[-56,+90]mm · 4 of 51 slices shown (1 of 3)]
[im 1/51]
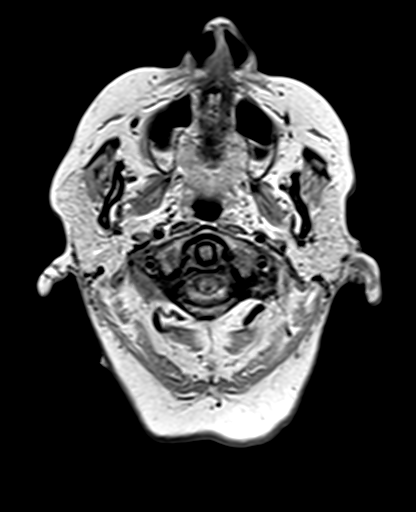
[im 17/51]
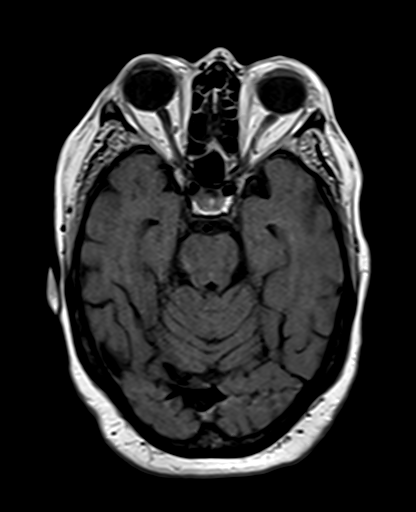
[im 34/51]
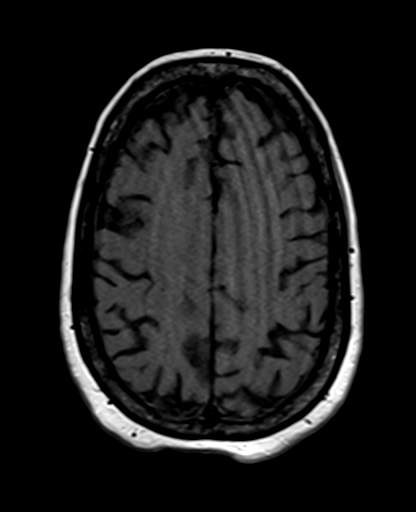
[im 51/51]
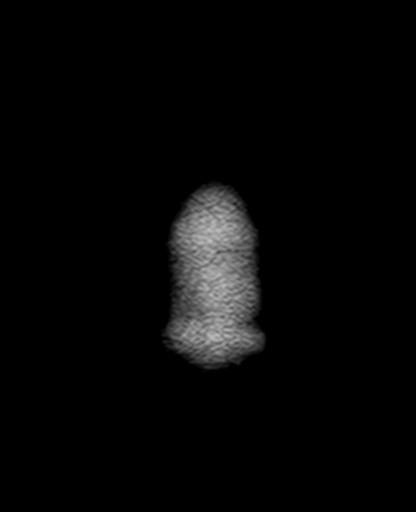

[Series 16: T1 post-contrast · coronal · 5.0mm · 0.43mm/px · 3 of 32 slices shown (2 of 3)]
[im 1/32]
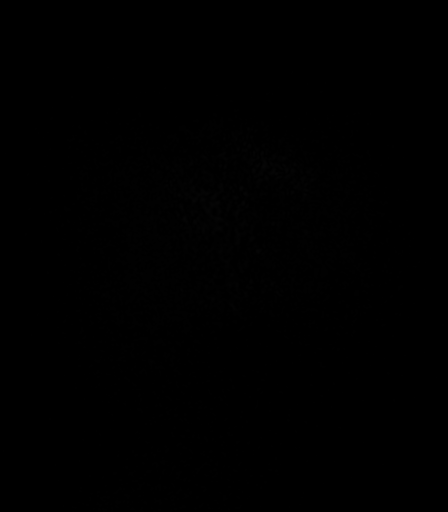
[im 16/32]
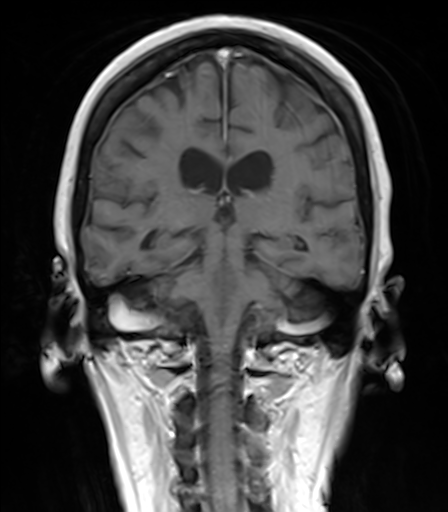
[im 32/32]
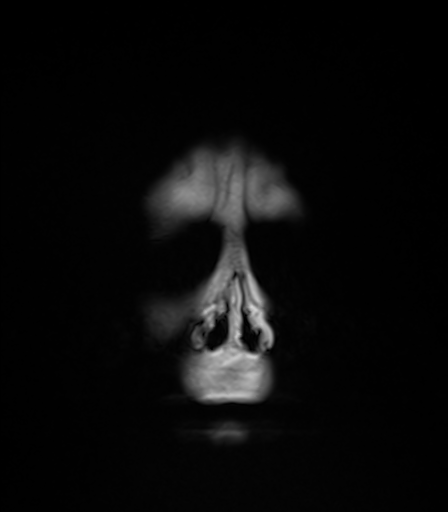

[Series 17: T1 post-contrast · sagittal · 5.0mm · 0.94mm/px · 2 of 25 slices shown (3 of 3)]
[im 1/25]
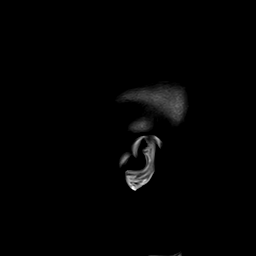
[im 25/25]
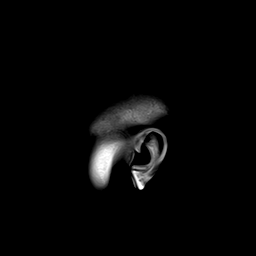

[47 of 48 positions shown; findings below may reference images not displayed]

FINDINGS: Brain: Diffuse intravascular gradient blooming and T1 shortening on
precontrast study, usually findings of IV iron administration.

No evidence of metastatic disease.

Cerebral volume loss which is generalized and subjectively mild for
age. Chronic small vessel ischemia in the cerebral white matter with
overall mild gliosis. Chronic lacune is are seen at the left upper
putamen, left caudate, and left posterior pons.

No acute infarct, hydrocephalus, or collection.

Vascular: Unremarkable

Skull and upper cervical spine: No marrow lesion is seen.

Sinuses/Orbits: No acute or contributory finding. Bilateral cataract
resection.
IMPRESSION: 1. Negative for metastatic disease or other cause of symptoms.
2. Chronic small vessel ischemia with remote lacunar infarcts.
3. Artifacts usually seen with recent iron infusion.

## 2022-12-30 IMAGING — DX DG CHEST 1V PORT
1 series · 1 of 1 positions shown · non-contrast
Comparison: 12/04/2020

CLINICAL DATA: Shortness of breath

EXAM:
PORTABLE CHEST 1 VIEW

[chest ap]
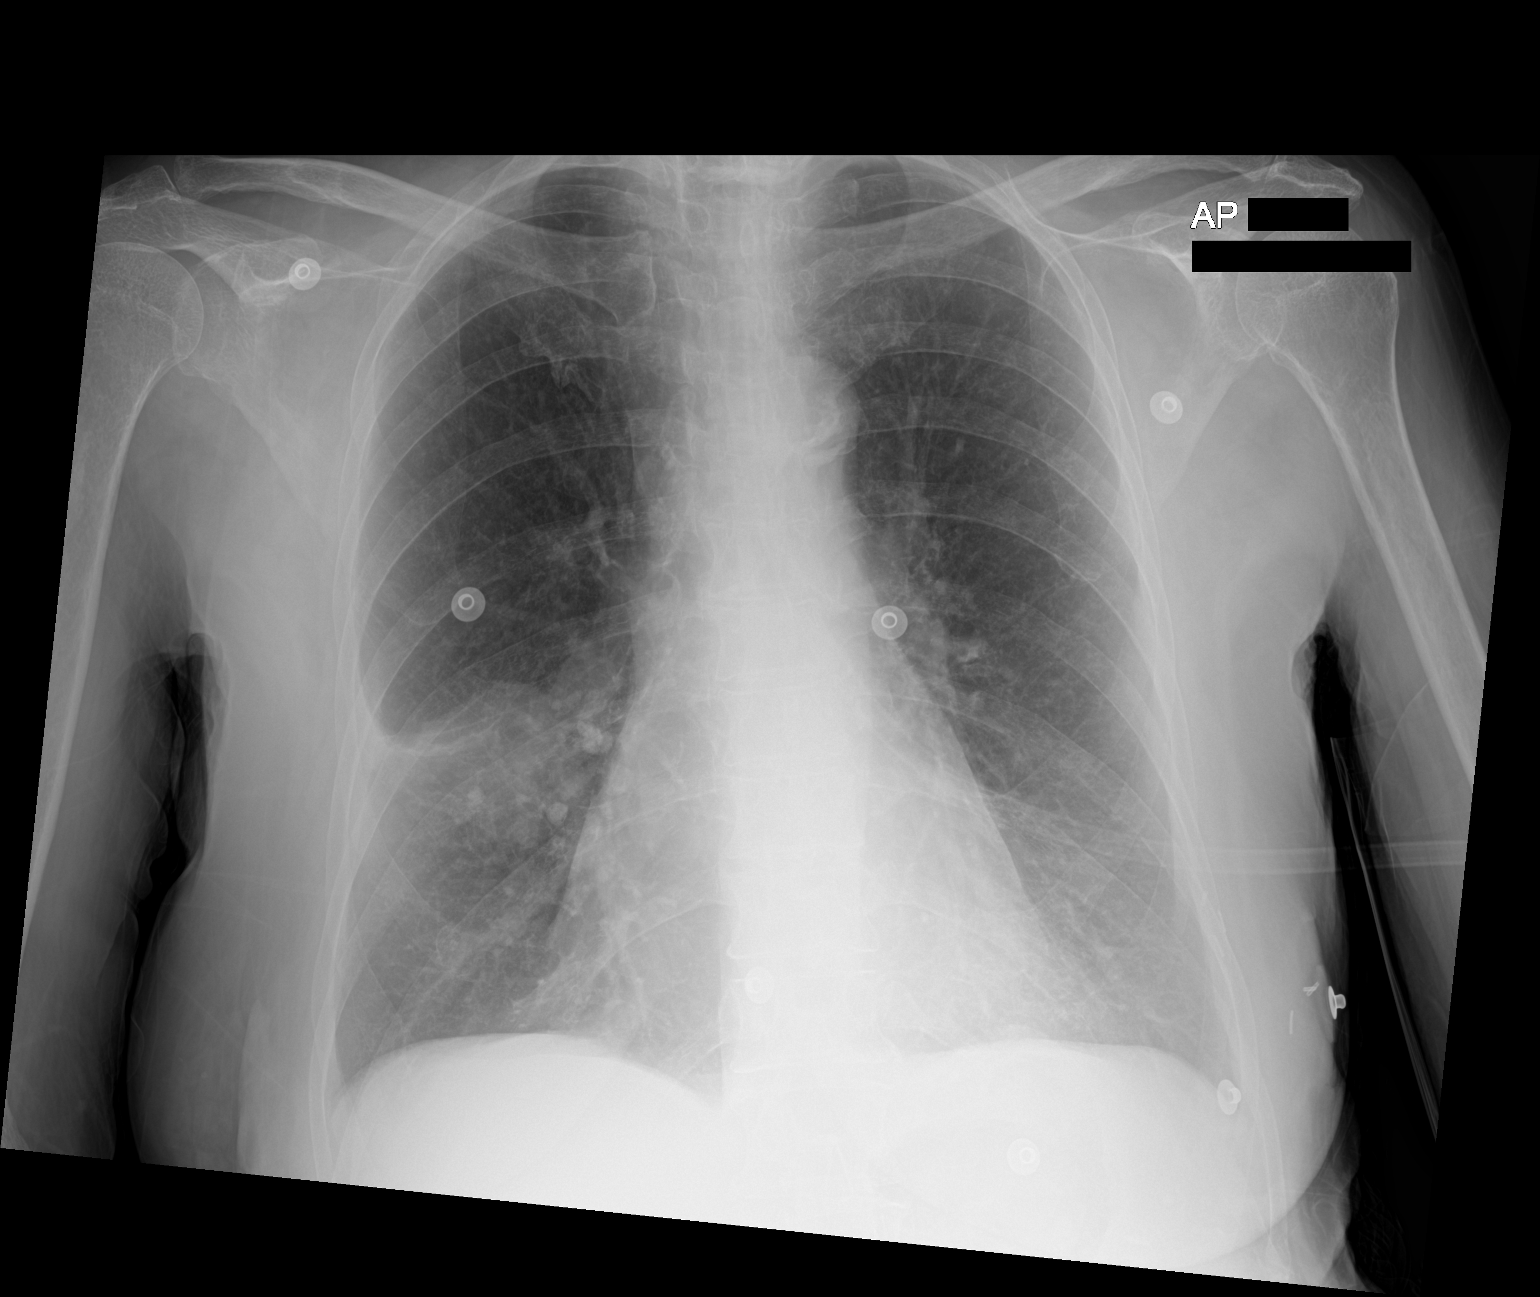

[1 of 1 positions shown; findings below may reference images not displayed]

FINDINGS: Cardiac shadow is stable. The lungs are well aerated bilaterally.
Persistent masslike density in the right mid lung is noted similar
to that seen on prior plain film and recent CT from 12/04/2020. No
other focal lung abnormality is noted. Aortic calcifications are
seen.
IMPRESSION: Stable right mid lung mass unchanged from prior exams 7 days
previous.
